# Patient Record
Sex: Female | Born: 1935 | Race: White | Hispanic: No | Smoking: Never smoker
Health system: Southern US, Community
[De-identification: ages and names within clinical notes are randomized; demographics above are authoritative.]

## PROBLEM LIST (undated history)

## (undated) DIAGNOSIS — T4145XA Adverse effect of unspecified anesthetic, initial encounter: Secondary | ICD-10-CM

## (undated) DIAGNOSIS — E039 Hypothyroidism, unspecified: Secondary | ICD-10-CM

## (undated) DIAGNOSIS — C801 Malignant (primary) neoplasm, unspecified: Secondary | ICD-10-CM

## (undated) DIAGNOSIS — Z5189 Encounter for other specified aftercare: Secondary | ICD-10-CM

## (undated) DIAGNOSIS — R112 Nausea with vomiting, unspecified: Secondary | ICD-10-CM

## (undated) DIAGNOSIS — Z803 Family history of malignant neoplasm of breast: Secondary | ICD-10-CM

## (undated) DIAGNOSIS — M810 Age-related osteoporosis without current pathological fracture: Secondary | ICD-10-CM

## (undated) DIAGNOSIS — D649 Anemia, unspecified: Secondary | ICD-10-CM

## (undated) DIAGNOSIS — T8859XA Other complications of anesthesia, initial encounter: Secondary | ICD-10-CM

## (undated) DIAGNOSIS — Z8489 Family history of other specified conditions: Secondary | ICD-10-CM

## (undated) DIAGNOSIS — Z9889 Other specified postprocedural states: Secondary | ICD-10-CM

## (undated) DIAGNOSIS — T7840XA Allergy, unspecified, initial encounter: Secondary | ICD-10-CM

## (undated) DIAGNOSIS — Z808 Family history of malignant neoplasm of other organs or systems: Secondary | ICD-10-CM

## (undated) DIAGNOSIS — Z8042 Family history of malignant neoplasm of prostate: Secondary | ICD-10-CM

## (undated) DIAGNOSIS — I10 Essential (primary) hypertension: Secondary | ICD-10-CM

## (undated) HISTORY — DX: Age-related osteoporosis without current pathological fracture: M81.0

## (undated) HISTORY — DX: Allergy, unspecified, initial encounter: T78.40XA

## (undated) HISTORY — DX: Family history of malignant neoplasm of prostate: Z80.42

## (undated) HISTORY — DX: Family history of malignant neoplasm of breast: Z80.3

## (undated) HISTORY — PX: TOTAL HIP ARTHROPLASTY: SHX124

## (undated) HISTORY — DX: Family history of malignant neoplasm of other organs or systems: Z80.8

## (undated) HISTORY — PX: THYROIDECTOMY: SHX17

## (undated) HISTORY — PX: OTHER SURGICAL HISTORY: SHX169

## (undated) HISTORY — PX: TONSILLECTOMY: SUR1361

## (undated) HISTORY — DX: Encounter for other specified aftercare: Z51.89

## (undated) HISTORY — DX: Anemia, unspecified: D64.9

---
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
## 1998-11-19 ENCOUNTER — Other Ambulatory Visit: Admission: RE | Admit: 1998-11-19 | Discharge: 1998-11-19 | Payer: Self-pay | Admitting: Family Medicine

## 2000-06-05 ENCOUNTER — Other Ambulatory Visit: Admission: RE | Admit: 2000-06-05 | Discharge: 2000-06-05 | Payer: Self-pay | Admitting: Family Medicine

## 2001-06-07 ENCOUNTER — Other Ambulatory Visit: Admission: RE | Admit: 2001-06-07 | Discharge: 2001-06-07 | Payer: Self-pay | Admitting: Family Medicine

## 2002-06-09 ENCOUNTER — Other Ambulatory Visit: Admission: RE | Admit: 2002-06-09 | Discharge: 2002-06-09 | Payer: Self-pay | Admitting: Family Medicine

## 2004-06-14 ENCOUNTER — Other Ambulatory Visit: Admission: RE | Admit: 2004-06-14 | Discharge: 2004-06-14 | Payer: Self-pay | Admitting: Family Medicine

## 2005-08-02 ENCOUNTER — Other Ambulatory Visit: Admission: RE | Admit: 2005-08-02 | Discharge: 2005-08-02 | Payer: Self-pay | Admitting: Family Medicine

## 2005-08-17 ENCOUNTER — Encounter: Admission: RE | Admit: 2005-08-17 | Discharge: 2005-08-17 | Payer: Self-pay | Admitting: Family Medicine

## 2006-03-19 DIAGNOSIS — M810 Age-related osteoporosis without current pathological fracture: Secondary | ICD-10-CM

## 2006-03-19 HISTORY — DX: Age-related osteoporosis without current pathological fracture: M81.0

## 2010-11-18 ENCOUNTER — Emergency Department (HOSPITAL_COMMUNITY): Payer: Medicare Other

## 2010-11-18 ENCOUNTER — Emergency Department (HOSPITAL_COMMUNITY)
Admission: EM | Admit: 2010-11-18 | Discharge: 2010-11-19 | Disposition: A | Discharge status: Home / Self Care | Payer: Medicare Other | Attending: Emergency Medicine | Admitting: Emergency Medicine

## 2010-11-18 DIAGNOSIS — E039 Hypothyroidism, unspecified: Secondary | ICD-10-CM | POA: Insufficient documentation

## 2010-11-18 DIAGNOSIS — R42 Dizziness and giddiness: Secondary | ICD-10-CM | POA: Insufficient documentation

## 2010-11-18 DIAGNOSIS — R5381 Other malaise: Secondary | ICD-10-CM | POA: Insufficient documentation

## 2010-11-18 DIAGNOSIS — K449 Diaphragmatic hernia without obstruction or gangrene: Secondary | ICD-10-CM | POA: Insufficient documentation

## 2010-11-18 DIAGNOSIS — M7989 Other specified soft tissue disorders: Secondary | ICD-10-CM | POA: Insufficient documentation

## 2010-11-18 DIAGNOSIS — R1110 Vomiting, unspecified: Secondary | ICD-10-CM | POA: Insufficient documentation

## 2010-11-18 DIAGNOSIS — I440 Atrioventricular block, first degree: Secondary | ICD-10-CM | POA: Insufficient documentation

## 2010-11-18 DIAGNOSIS — R0602 Shortness of breath: Secondary | ICD-10-CM | POA: Insufficient documentation

## 2010-11-18 DIAGNOSIS — I10 Essential (primary) hypertension: Secondary | ICD-10-CM | POA: Insufficient documentation

## 2010-11-18 DIAGNOSIS — Q619 Cystic kidney disease, unspecified: Secondary | ICD-10-CM | POA: Insufficient documentation

## 2010-11-18 DIAGNOSIS — R55 Syncope and collapse: Secondary | ICD-10-CM | POA: Insufficient documentation

## 2010-11-18 LAB — DIFFERENTIAL
Basophils Absolute: 0 10*3/uL (ref 0.0–0.1)
Basophils Relative: 0 % (ref 0–1)
Eosinophils Absolute: 0 10*3/uL (ref 0.0–0.7)
Lymphocytes Relative: 12 % (ref 12–46)
Monocytes Relative: 4 % (ref 3–12)
Neutro Abs: 8.5 10*3/uL — ABNORMAL HIGH (ref 1.7–7.7)

## 2010-11-18 LAB — CBC
Hemoglobin: 13.2 g/dL (ref 12.0–15.0)
MCV: 87.3 fL (ref 78.0–100.0)
WBC: 10.1 10*3/uL (ref 4.0–10.5)

## 2010-11-19 ENCOUNTER — Emergency Department (HOSPITAL_COMMUNITY): Payer: Medicare Other

## 2010-11-19 LAB — URINE MICROSCOPIC-ADD ON

## 2010-11-19 LAB — URINALYSIS, ROUTINE W REFLEX MICROSCOPIC
Hgb urine dipstick: NEGATIVE
Ketones, ur: NEGATIVE mg/dL
Protein, ur: NEGATIVE mg/dL
pH: 7.5 (ref 5.0–8.0)

## 2010-11-19 LAB — TROPONIN I: Troponin I: <0.3 ng/mL (ref <0.30)

## 2010-11-19 LAB — COMPREHENSIVE METABOLIC PANEL
Alkaline Phosphatase: 88 U/L (ref 39–117)
BUN: 10 mg/dL (ref 6–23)
Calcium: 9.4 mg/dL (ref 8.4–10.5)
Chloride: 101 mEq/L (ref 96–112)
GFR calc Af Amer: >60 mL/min (ref >60)
Potassium: 4 mEq/L (ref 3.5–5.1)

## 2010-11-19 LAB — PRO B NATRIURETIC PEPTIDE: Pro B Natriuretic peptide (BNP): 106.1 pg/mL (ref 0–450)

## 2010-11-19 LAB — CK TOTAL AND CKMB (NOT AT ARMC): Total CK: 114 U/L (ref 7–177)

## 2010-11-19 MED ORDER — IOHEXOL 300 MG/ML  SOLN
90.0000 mL | Freq: Once | INTRAMUSCULAR | Status: AC | PRN
  Administered 2010-11-19: 90 mL via INTRAVENOUS

## 2011-05-30 ENCOUNTER — Other Ambulatory Visit: Payer: Self-pay

## 2011-05-30 ENCOUNTER — Emergency Department (HOSPITAL_COMMUNITY)
Admission: EM | Admit: 2011-05-30 | Discharge: 2011-05-30 | Disposition: A | Discharge status: Home / Self Care | Payer: Medicare Other | Attending: Emergency Medicine | Admitting: Emergency Medicine

## 2011-05-30 ENCOUNTER — Emergency Department (HOSPITAL_COMMUNITY): Payer: Medicare Other

## 2011-05-30 DIAGNOSIS — F411 Generalized anxiety disorder: Secondary | ICD-10-CM | POA: Insufficient documentation

## 2011-05-30 DIAGNOSIS — R55 Syncope and collapse: Secondary | ICD-10-CM | POA: Insufficient documentation

## 2011-05-30 DIAGNOSIS — R0602 Shortness of breath: Secondary | ICD-10-CM | POA: Insufficient documentation

## 2011-05-30 DIAGNOSIS — Z79899 Other long term (current) drug therapy: Secondary | ICD-10-CM | POA: Insufficient documentation

## 2011-05-30 DIAGNOSIS — R5381 Other malaise: Secondary | ICD-10-CM | POA: Insufficient documentation

## 2011-05-30 DIAGNOSIS — I10 Essential (primary) hypertension: Secondary | ICD-10-CM | POA: Insufficient documentation

## 2011-05-30 DIAGNOSIS — E039 Hypothyroidism, unspecified: Secondary | ICD-10-CM | POA: Insufficient documentation

## 2011-05-30 DIAGNOSIS — I498 Other specified cardiac arrhythmias: Secondary | ICD-10-CM | POA: Insufficient documentation

## 2011-05-30 DIAGNOSIS — R002 Palpitations: Secondary | ICD-10-CM | POA: Insufficient documentation

## 2011-05-30 DIAGNOSIS — J45909 Unspecified asthma, uncomplicated: Secondary | ICD-10-CM | POA: Insufficient documentation

## 2011-05-30 DIAGNOSIS — R630 Anorexia: Secondary | ICD-10-CM | POA: Insufficient documentation

## 2011-05-30 DIAGNOSIS — R42 Dizziness and giddiness: Secondary | ICD-10-CM | POA: Insufficient documentation

## 2011-05-30 DIAGNOSIS — R5383 Other fatigue: Secondary | ICD-10-CM | POA: Insufficient documentation

## 2011-05-30 HISTORY — DX: Essential (primary) hypertension: I10

## 2011-05-30 HISTORY — DX: Hypothyroidism, unspecified: E03.9

## 2011-05-30 LAB — CARDIAC PANEL(CRET KIN+CKTOT+MB+TROPI)
CK, MB: 4.4 ng/mL — ABNORMAL HIGH (ref 0.3–4.0)
CK, MB: 4.3 ng/mL — ABNORMAL HIGH (ref 0.3–4.0)
Total CK: 138 U/L (ref 7–177)
Total CK: 117 U/L (ref 7–177)

## 2011-05-30 LAB — URINALYSIS, ROUTINE W REFLEX MICROSCOPIC
Ketones, ur: 40 mg/dL — AB
Leukocytes, UA: NEGATIVE
Nitrite: NEGATIVE
Protein, ur: NEGATIVE mg/dL

## 2011-05-30 LAB — BASIC METABOLIC PANEL
CO2: 21 mEq/L (ref 19–32)
Creatinine, Ser: 0.59 mg/dL (ref 0.50–1.10)
GFR calc non Af Amer: 86 mL/min — ABNORMAL LOW (ref >90)
Glucose, Bld: 124 mg/dL — ABNORMAL HIGH (ref 70–99)
Potassium: 3.6 mEq/L (ref 3.5–5.1)
Sodium: 132 mEq/L — ABNORMAL LOW (ref 135–145)

## 2011-05-30 LAB — DIFFERENTIAL
Lymphocytes Relative: 22 % (ref 12–46)
Neutro Abs: 5.6 10*3/uL (ref 1.7–7.7)

## 2011-05-30 LAB — CBC
HCT: 41.1 % (ref 36.0–46.0)
Hemoglobin: 14.5 g/dL (ref 12.0–15.0)
MCHC: 35.3 g/dL (ref 30.0–36.0)
Platelets: 358 10*3/uL (ref 150–400)
RBC: 4.85 MIL/uL (ref 3.87–5.11)
RDW: 13.5 % (ref 11.5–15.5)

## 2011-05-30 NOTE — ED Provider Notes (Signed)
History     CSN: [id number] Arrival date & time: 05/30/2011  8:11 AM   First MD Initiated Contact with Patient 05/30/11 [phone]      Chief Complaint  Patient presents with  . Palpitations    (Consider location/radiation/quality/duration/timing/severity/associated sxs/prior treatment) HPI Comments: Patient reports of feeling unwell since November 30. She believes that she had the flu. She's felt generally weak, lightheaded, decreased appetite, intermittent sensation of shortness of breath and palpitations. Chin episode this morning of palpitations that woke her from sleep and was associated with restlessness and anxiety. She denies any chest pain, chest tightness, abdominal pain, nausea or vomiting. She reports has not felt well since she was started on blood pressure medications in March.  She reports no cardiac problems but has never had a stress test. She states she does have a history of asthma but is not any medications. She's had multiple sick contacts as she works in a daycare but has not had any fevers. She's had no pain with urination or hematuria. She's been moving her bowels normally.   Patient is a 77 y.o. female presenting with palpitations. The history is provided by the patient and a relative.  Palpitations  This is a recurrent problem. The current episode started more than 1 week ago. The problem occurs constantly. The problem has been gradually worsening. The problem is associated with stress. Associated symptoms include malaise/fatigue, irregular heartbeat, near-syncope, dizziness and weakness. Pertinent negatives include no fever, no chest pain, no chest pressure, no syncope, no abdominal pain, no nausea, no vomiting, no headaches, no back pain, no cough and no shortness of breath.    Past Medical History  Diagnosis Date  . Hypothyroidism   . Hypertension     Past Surgical History  Procedure Date  . Thyroidectomy   . Total hip arthroplasty     History reviewed. No  pertinent family history.  History  Substance Use Topics  . Smoking status: Never Smoker   . Smokeless tobacco: Not on file  . Alcohol Use: No    OB History    Grav Para Term Preterm Abortions TAB SAB Ect Mult Living                  Review of Systems  Constitutional: Positive for malaise/fatigue, activity change, appetite change and fatigue. Negative for fever.  HENT: Negative for congestion and rhinorrhea.   Eyes: Negative for visual disturbance.  Respiratory: Negative for cough, chest tightness and shortness of breath.   Cardiovascular: Positive for palpitations and near-syncope. Negative for chest pain and syncope.  Gastrointestinal: Negative for nausea, vomiting and abdominal pain.  Genitourinary: Negative for dysuria and hematuria.  Musculoskeletal: Negative for back pain.  Skin: Negative for rash.  Neurological: Positive for dizziness, weakness and light-headedness. Negative for headaches.    Allergies  Review of patient's allergies indicates no known allergies.  Home Medications   Current Outpatient Rx  Name Route Sig Dispense Refill  . VITAMIN B COMPLEX PO Oral Take 1 tablet by mouth daily.      . IBUPROFEN 200 MG PO TABS Oral Take 200 mg by mouth daily as needed. For pain     . LEVOTHYROXINE SODIUM 100 MCG PO TABS Oral Take 100 mcg by mouth daily.     Carma Leaven M PLUS PO TABS Oral Take 1 tablet by mouth daily.      Marland Kitchen OLMESARTAN MEDOXOMIL 40 MG PO TABS Oral Take 40 mg by mouth daily.     Marland Kitchen VITAMIN  K PO Oral Take 1 tablet by mouth daily.        BP 156/81  Pulse 75  Temp(Src) 97.3 F (36.3 C) (Oral)  Resp 13  SpO2 98%  Physical Exam  Constitutional: She is oriented to person, place, and time. She appears well-developed and well-nourished. No distress.  HENT:  Head: Normocephalic and atraumatic.  Mouth/Throat: Oropharynx is clear and moist. No oropharyngeal exudate.  Eyes: Conjunctivae are normal. Pupils are equal, round, and reactive to light.  Neck:  Normal range of motion. Neck supple.  Cardiovascular: Normal rate, regular rhythm and normal heart sounds.   Pulmonary/Chest: Effort normal and breath sounds normal. No respiratory distress.  Abdominal: Soft. There is no tenderness. There is no rebound and no guarding.  Musculoskeletal: Normal range of motion.  Neurological: She is alert and oriented to person, place, and time. No cranial nerve deficit.  Skin: Skin is warm.    ED Course  Procedures (including critical care time)  Labs Reviewed  BASIC METABOLIC PANEL - Abnormal; Notable for the following:    Sodium 132 (*)    Glucose, Bld 124 (*)    GFR calc non Af Amer 86 (*)    All other components within normal limits  URINALYSIS, ROUTINE W REFLEX MICROSCOPIC - Abnormal; Notable for the following:    Ketones, ur 40 (*)    All other components within normal limits  CARDIAC PANEL(CRET KIN+CKTOT+MB+TROPI) - Abnormal; Notable for the following:    CK, MB 4.4 (*)    Relative Index 3.1 (*)    All other components within normal limits  CARDIAC PANEL(CRET KIN+CKTOT+MB+TROPI) - Abnormal; Notable for the following:    CK, MB 4.3 (*)    Relative Index 3.1 (*)    All other components within normal limits  CARDIAC PANEL(CRET KIN+CKTOT+MB+TROPI) - Abnormal; Notable for the following:    Relative Index 3.2 (*)    All other components within normal limits  CBC  DIFFERENTIAL  TSH  T4, FREE   Dg Chest 2 View  05/30/2011  *RADIOLOGY REPORT*  Clinical Data: Weakness and tachycardia.  CHEST - 2 VIEW  Comparison: Chest x-ray 11/18/2010.  Findings: The cardiac silhouette, mediastinal and hilar contours are within normal limits and stable.  The lungs demonstrate chronic changes with emphysematous change and areas of pulmonary scarring. No acute overlying pulmonary process.  No pleural effusion.  The bony thorax is intact.  IMPRESSION: Chronic lung changes but no acute pulmonary findings.  Original Report Authenticated By: P. Loralie Champagne, M.D.      1. Palpitations       MDM  Intermittent palpitations, SOB, weakness, fatigue x 2 weeks.  Symptoms woke her from sleep this morning.  No chest pain, abdominal pain.  Poor appetite.  Will check EKG, screening labs, orthostatics.   Date: 05/30/2011  Rate: 91  Rhythm: normal sinus rhythm  QRS Axis: normal  Intervals: normal  ST/T Wave abnormalities: nonspecific T wave changes  Conduction Disutrbances:none  Narrative Interpretation: PACs  Old EKG Reviewed: unchanged  Labs reviewedl.  Patient asymptomatic, tolerating PO.  Electrolytes and cardiac enzymes wnl.  Vitals stable.  Outpatient referral to cardiology for possible Holter.  D/w Dr. Myrtis Ser personally.      Glynn Octave, MD 05/30/11 [phone]

## 2011-05-30 NOTE — ED Notes (Signed)
Patient presents with palpitations upon waking this AM with anxiety, SOB, and restlessness.  Patient alert and orientedx3, denies chest pain.  Heart rate noted to be irregular on monitor.  Patient appears very anxious.

## 2011-05-31 ENCOUNTER — Telehealth: Payer: Self-pay | Admitting: Cardiology

## 2011-05-31 NOTE — Telephone Encounter (Signed)
Pt was seen in ED per her son Shannon Ellis. [name] would like his mother to see Dr. [name] because Per Mr. Kyung Rudd Dr. [name] is a client of his.

## 2011-05-31 NOTE — Telephone Encounter (Signed)
N/A.  LMTC. 

## 2011-06-01 NOTE — Telephone Encounter (Signed)
Pt was in ER for palpitations and note states she was discussed with you.  Will you take her on as a new pt?  You see her son Lloyd Huger.  If so, how soon does she need to be seen?

## 2011-08-24 DIAGNOSIS — H11049 Peripheral pterygium, stationary, unspecified eye: Secondary | ICD-10-CM | POA: Diagnosis not present

## 2011-08-24 DIAGNOSIS — H2510 Age-related nuclear cataract, unspecified eye: Secondary | ICD-10-CM | POA: Diagnosis not present

## 2011-08-25 DIAGNOSIS — M810 Age-related osteoporosis without current pathological fracture: Secondary | ICD-10-CM | POA: Diagnosis not present

## 2011-08-25 DIAGNOSIS — R7301 Impaired fasting glucose: Secondary | ICD-10-CM | POA: Diagnosis not present

## 2011-08-25 DIAGNOSIS — I10 Essential (primary) hypertension: Secondary | ICD-10-CM | POA: Diagnosis not present

## 2011-08-25 DIAGNOSIS — E785 Hyperlipidemia, unspecified: Secondary | ICD-10-CM | POA: Diagnosis not present

## 2011-08-31 DIAGNOSIS — Z1212 Encounter for screening for malignant neoplasm of rectum: Secondary | ICD-10-CM | POA: Diagnosis not present

## 2011-09-01 DIAGNOSIS — E785 Hyperlipidemia, unspecified: Secondary | ICD-10-CM | POA: Diagnosis not present

## 2011-09-01 DIAGNOSIS — I10 Essential (primary) hypertension: Secondary | ICD-10-CM | POA: Diagnosis not present

## 2011-09-01 DIAGNOSIS — Z124 Encounter for screening for malignant neoplasm of cervix: Secondary | ICD-10-CM | POA: Diagnosis not present

## 2011-09-01 DIAGNOSIS — Z0000 Encounter for general adult medical examination without abnormal findings: Secondary | ICD-10-CM | POA: Diagnosis not present

## 2011-09-01 DIAGNOSIS — R7301 Impaired fasting glucose: Secondary | ICD-10-CM | POA: Diagnosis not present

## 2012-01-18 DIAGNOSIS — N2889 Other specified disorders of kidney and ureter: Secondary | ICD-10-CM | POA: Diagnosis not present

## 2012-01-18 DIAGNOSIS — R1031 Right lower quadrant pain: Secondary | ICD-10-CM | POA: Diagnosis not present

## 2012-01-18 DIAGNOSIS — I10 Essential (primary) hypertension: Secondary | ICD-10-CM | POA: Diagnosis not present

## 2012-01-18 DIAGNOSIS — K5730 Diverticulosis of large intestine without perforation or abscess without bleeding: Secondary | ICD-10-CM | POA: Diagnosis not present

## 2012-01-18 DIAGNOSIS — S72009A Fracture of unspecified part of neck of unspecified femur, initial encounter for closed fracture: Secondary | ICD-10-CM | POA: Diagnosis not present

## 2012-01-18 DIAGNOSIS — M549 Dorsalgia, unspecified: Secondary | ICD-10-CM | POA: Diagnosis not present

## 2012-11-13 ENCOUNTER — Emergency Department (HOSPITAL_COMMUNITY)
Admission: EM | Admit: 2012-11-13 | Discharge: 2012-11-13 | Disposition: A | Discharge status: Home / Self Care | Payer: Medicare Other | Attending: Emergency Medicine | Admitting: Emergency Medicine

## 2012-11-13 ENCOUNTER — Encounter (HOSPITAL_COMMUNITY): Payer: Self-pay | Admitting: Emergency Medicine

## 2012-11-13 ENCOUNTER — Emergency Department (HOSPITAL_COMMUNITY): Payer: Medicare Other

## 2012-11-13 DIAGNOSIS — I10 Essential (primary) hypertension: Secondary | ICD-10-CM | POA: Insufficient documentation

## 2012-11-13 DIAGNOSIS — R0789 Other chest pain: Secondary | ICD-10-CM | POA: Diagnosis not present

## 2012-11-13 DIAGNOSIS — R209 Unspecified disturbances of skin sensation: Secondary | ICD-10-CM | POA: Diagnosis not present

## 2012-11-13 DIAGNOSIS — Z79899 Other long term (current) drug therapy: Secondary | ICD-10-CM | POA: Diagnosis not present

## 2012-11-13 DIAGNOSIS — R42 Dizziness and giddiness: Secondary | ICD-10-CM | POA: Diagnosis not present

## 2012-11-13 DIAGNOSIS — R112 Nausea with vomiting, unspecified: Secondary | ICD-10-CM | POA: Insufficient documentation

## 2012-11-13 DIAGNOSIS — R079 Chest pain, unspecified: Secondary | ICD-10-CM | POA: Diagnosis not present

## 2012-11-13 DIAGNOSIS — E039 Hypothyroidism, unspecified: Secondary | ICD-10-CM | POA: Diagnosis not present

## 2012-11-13 DIAGNOSIS — R51 Headache: Secondary | ICD-10-CM | POA: Insufficient documentation

## 2012-11-13 LAB — BASIC METABOLIC PANEL
CO2: 20 mEq/L (ref 19–32)
Calcium: 9.4 mg/dL (ref 8.4–10.5)
Creatinine, Ser: 0.68 mg/dL (ref 0.50–1.10)
Glucose, Bld: 175 mg/dL — ABNORMAL HIGH (ref 70–99)

## 2012-11-13 LAB — CBC WITH DIFFERENTIAL/PLATELET
Eosinophils Relative: 5 % (ref 0–5)
HCT: 40.3 % (ref 36.0–46.0)
Lymphocytes Relative: 30 % (ref 12–46)
Lymphs Abs: 1.9 10*3/uL (ref 0.7–4.0)
MCV: 89 fL (ref 78.0–100.0)
Monocytes Absolute: 0.5 10*3/uL (ref 0.1–1.0)
RBC: 4.53 MIL/uL (ref 3.87–5.11)
WBC: 6.3 10*3/uL (ref 4.0–10.5)

## 2012-11-13 LAB — POCT I-STAT TROPONIN I

## 2012-11-13 MED ORDER — ONDANSETRON 4 MG PO TBDP: 4.0000 mg | ORAL_TABLET | Freq: Three times a day (TID) | ORAL | Status: DC | PRN

## 2012-11-13 MED ORDER — ONDANSETRON HCL 4 MG/2ML IJ SOLN
4.0000 mg | Freq: Once | INTRAMUSCULAR | Status: AC
  Administered 2012-11-13: 4 mg via INTRAVENOUS

## 2012-11-13 MED ORDER — ONDANSETRON 4 MG PO TBDP: 8.0000 mg | ORAL_TABLET | Freq: Once | ORAL | Status: DC

## 2012-11-13 MED ORDER — ONDANSETRON HCL 4 MG/2ML IJ SOLN
INTRAMUSCULAR | Status: AC
  Administered 2012-11-13: 4 mg via INTRAVENOUS
  Filled 2012-11-13: qty 2

## 2012-11-13 NOTE — ED Provider Notes (Signed)
Medical screening examination/treatment/procedure(s) were conducted as a shared visit with non-physician practitioner(s) and myself.  I personally evaluated the patient during the encounter.  Patient with non-specific symptoms of headache, "strange" sensation in head, weakness, dizziness which has spontaneously resolved. Normal exam currently. Workup also negative. Discussed with PCP, recommended folow up in office.   EKG:  Date: 11/13/2012  Rate: 84  Rhythm: normal sinus rhythm  QRS Axis: normal  Intervals: normal  ST/T Wave abnormalities: normal  Conduction Disutrbances:none  Narrative Interpretation:   Old EKG Reviewed: unchanged     Gilda Crease, MD 11/13/12 1240

## 2012-11-13 NOTE — Discharge Instructions (Signed)
Take Zofran as needed for nausea. Follow up with Dr. Clelia Croft. Refer to attached documents for more information regarding your diagnosis. Return to the ED with worsening or concerning symptoms.

## 2012-11-13 NOTE — ED Notes (Signed)
H/a first and then startd to vomit this am

## 2012-11-13 NOTE — ED Provider Notes (Signed)
History     CSN: [id number]  Arrival date & time 11/13/12  7829   First MD Initiated Contact with Patient 11/13/12 [phone]      Chief Complaint  Patient presents with  . Emesis  . Headache    (Consider location/radiation/quality/duration/timing/severity/associated sxs/prior treatment) HPI Comments: Patient is a 79 year old female with a past medical history of hypertension and hypothyroidism who presents with a "weird facial sensation" that occurred prior to arrival. Patient reports driving to work and experiencing sudden onset of a facial sensation that is difficulty for the patient to describe. The sensation involved the left side of her face and lasted about 5 minutes. Patient reports sudden onset of nausea and vomiting after she noticed the facial sensation. Patient also describes a feeling of chest heaviness without radiation that lasted about 5 minutes before resolving. Patient is currently asymptomatic except for nausea. No aggravating/alleviating factors. No other associated symptoms.    Past Medical History  Diagnosis Date  . Hypothyroidism   . Hypertension     Past Surgical History  Procedure Laterality Date  . Thyroidectomy    . Total hip arthroplasty      No family history on file.  History  Substance Use Topics  . Smoking status: Never Smoker   . Smokeless tobacco: Not on file  . Alcohol Use: No    OB History   Grav Para Term Preterm Abortions TAB SAB Ect Mult Living                  Review of Systems  Cardiovascular: Positive for chest pain.  Gastrointestinal: Positive for nausea and vomiting.  Neurological: Positive for dizziness and numbness.  All other systems reviewed and are negative.    Allergies  Review of patient's allergies indicates no known allergies.  Home Medications   Current Outpatient Rx  Name  Route  Sig  Dispense  Refill  . B Complex Vitamins (VITAMIN B COMPLEX PO)   Oral   Take 1 tablet by mouth daily.           Marland Kitchen  ibuprofen (ADVIL,MOTRIN) 200 MG tablet   Oral   Take 200 mg by mouth daily as needed. For pain          . levothyroxine (SYNTHROID, LEVOTHROID) 100 MCG tablet   Oral   Take 100 mcg by mouth daily.          . Multiple Vitamins-Minerals (MULTIVITAMINS THER. W/MINERALS) TABS   Oral   Take 1 tablet by mouth daily.           Marland Kitchen olmesartan (BENICAR) 40 MG tablet   Oral   Take 40 mg by mouth daily.          Marland Kitchen VITAMIN K PO   Oral   Take 1 tablet by mouth daily.             BP 169/81  Pulse 99  Temp(Src) 97.6 F (36.4 C) (Oral)  Resp 17  Ht 5\' 6"  (1.676 m)  Wt 170 lb (77.111 kg)  BMI 27.45 kg/m2  SpO2 99%  Physical Exam  Nursing note and vitals reviewed. Constitutional: She is oriented to person, place, and time. She appears well-developed and well-nourished. No distress.  HENT:  Head: Normocephalic and atraumatic.  Right Ear: External ear normal.  Left Ear: External ear normal.  Mouth/Throat: Oropharynx is clear and moist. No oropharyngeal exudate.  Eyes: Conjunctivae and EOM are normal. Pupils are equal, round, and reactive to light. No  scleral icterus.  Neck: Normal range of motion.  Cardiovascular: Normal rate and regular rhythm.  Exam reveals no gallop and no friction rub.   No murmur heard. Pulmonary/Chest: Effort normal and breath sounds normal. She has no wheezes. She has no rales. She exhibits no tenderness.  Abdominal: Soft. She exhibits no distension. There is no tenderness. There is no rebound and no guarding.  Musculoskeletal: Normal range of motion.  Neurological: She is alert and oriented to person, place, and time. No cranial nerve deficit. Coordination normal.  Extremity strength and sensation equal and intact bilaterally. Speech is goal-oriented. Moves limbs without ataxia.   Skin: Skin is warm and dry.  Psychiatric: She has a normal mood and affect. Her behavior is normal.    ED Course  Procedures (including critical care time)  Labs Reviewed   BASIC METABOLIC PANEL - Abnormal; Notable for the following:    Potassium 3.3 (*)    Glucose, Bld 175 (*)    GFR calc non Af Amer 82 (*)    All other components within normal limits  CBC WITH DIFFERENTIAL  POCT I-STAT TROPONIN I   Dg Chest 2 View  11/13/2012   *RADIOLOGY REPORT*  Clinical Data: Chest pressure.  Dizziness.  Nausea and vomiting.  CHEST - 2 VIEW  Comparison: 05/30/2011  Findings: Midline trachea.  Mild convex right thoracic spine curvature.  Borderline cardiomegaly, with atherosclerosis in the transverse aorta. No pleural effusion or pneumothorax.  Mild biapical pleural thickening. Clear lungs.  IMPRESSION: Borderline cardiomegaly, without acute disease.   Original Report Authenticated By: Jeronimo Greaves, M.D.   Ct Head Wo Contrast  11/13/2012   *RADIOLOGY REPORT*  Clinical Data: Headache and emesis.  CT HEAD WITHOUT CONTRAST  Technique:  Contiguous axial images were obtained from the base of the skull through the vertex without contrast.  Comparison: None.  Findings: Bone windows demonstrate mucosal thickening of the left maxillary sinus and left ethmoid air cells.  Left sphenoid sinus mucosal thickening. Clear mastoid air cells.  Soft tissue windows demonstrate normal cerebral volume for age. Moderate low density in the periventricular white matter likely related to small vessel disease. No  mass lesion, hemorrhage, hydrocephalus, acute infarct, intra-axial, or extra-axial fluid collection.  IMPRESSION:  1. No acute intracranial abnormality. 2.  Moderate small vessel ischemic change. 3.  Sinus disease.   Original Report Authenticated By: Jeronimo Greaves, M.D.     1. Nausea and vomiting       MDM  9:05 AM Labs, troponin, CT head pending. Patient will have zofran for nausea. Vitals stable and patient afebrile.     11:14 AM Labs, troponin and CT head unremarkable for acute change. I spoke with Dr. Felipa Eth of [facility] about the patient's visit today. We both agree the  patient is low risk and can follow up in the office. She has had normal carotid doppler studies in the past year and only hypertension and hypothyroidism for PMH. Patient will be discharged with Zofran for nausea and instructed to return with worsening or concerning symptoms. Dr. Blinda Leatherwood saw the patient and agrees with outpatient follow up.         Emilia Beck, PA-C 11/13/12 1151

## 2012-11-13 NOTE — ED Notes (Signed)
Patient transported to CT 

## 2012-11-14 DIAGNOSIS — R51 Headache: Secondary | ICD-10-CM | POA: Diagnosis not present

## 2012-11-14 DIAGNOSIS — Z1331 Encounter for screening for depression: Secondary | ICD-10-CM | POA: Diagnosis not present

## 2012-11-14 DIAGNOSIS — Z6826 Body mass index (BMI) 26.0-26.9, adult: Secondary | ICD-10-CM | POA: Diagnosis not present

## 2012-11-14 DIAGNOSIS — R112 Nausea with vomiting, unspecified: Secondary | ICD-10-CM | POA: Diagnosis not present

## 2012-11-25 DIAGNOSIS — M810 Age-related osteoporosis without current pathological fracture: Secondary | ICD-10-CM | POA: Diagnosis not present

## 2012-11-25 DIAGNOSIS — R7301 Impaired fasting glucose: Secondary | ICD-10-CM | POA: Diagnosis not present

## 2012-11-25 DIAGNOSIS — E063 Autoimmune thyroiditis: Secondary | ICD-10-CM | POA: Diagnosis not present

## 2012-11-25 DIAGNOSIS — I10 Essential (primary) hypertension: Secondary | ICD-10-CM | POA: Diagnosis not present

## 2012-11-25 DIAGNOSIS — R82998 Other abnormal findings in urine: Secondary | ICD-10-CM | POA: Diagnosis not present

## 2012-11-25 DIAGNOSIS — E785 Hyperlipidemia, unspecified: Secondary | ICD-10-CM | POA: Diagnosis not present

## 2012-12-02 DIAGNOSIS — Z0000 Encounter for general adult medical examination without abnormal findings: Secondary | ICD-10-CM | POA: Diagnosis not present

## 2012-12-02 DIAGNOSIS — I10 Essential (primary) hypertension: Secondary | ICD-10-CM | POA: Diagnosis not present

## 2012-12-02 DIAGNOSIS — R51 Headache: Secondary | ICD-10-CM | POA: Diagnosis not present

## 2012-12-02 DIAGNOSIS — E785 Hyperlipidemia, unspecified: Secondary | ICD-10-CM | POA: Diagnosis not present

## 2012-12-02 DIAGNOSIS — E063 Autoimmune thyroiditis: Secondary | ICD-10-CM | POA: Diagnosis not present

## 2012-12-02 DIAGNOSIS — Z6826 Body mass index (BMI) 26.0-26.9, adult: Secondary | ICD-10-CM | POA: Diagnosis not present

## 2012-12-02 DIAGNOSIS — R7301 Impaired fasting glucose: Secondary | ICD-10-CM | POA: Diagnosis not present

## 2012-12-02 DIAGNOSIS — M810 Age-related osteoporosis without current pathological fracture: Secondary | ICD-10-CM | POA: Diagnosis not present

## 2012-12-03 DIAGNOSIS — Z1212 Encounter for screening for malignant neoplasm of rectum: Secondary | ICD-10-CM | POA: Diagnosis not present

## 2012-12-17 DIAGNOSIS — M25559 Pain in unspecified hip: Secondary | ICD-10-CM | POA: Diagnosis not present

## 2013-01-07 DIAGNOSIS — M25559 Pain in unspecified hip: Secondary | ICD-10-CM | POA: Diagnosis not present

## 2013-02-10 DIAGNOSIS — N1330 Unspecified hydronephrosis: Secondary | ICD-10-CM | POA: Diagnosis not present

## 2013-02-18 DIAGNOSIS — N1330 Unspecified hydronephrosis: Secondary | ICD-10-CM | POA: Diagnosis not present

## 2013-02-19 DIAGNOSIS — N1330 Unspecified hydronephrosis: Secondary | ICD-10-CM | POA: Diagnosis not present

## 2013-02-19 DIAGNOSIS — N281 Cyst of kidney, acquired: Secondary | ICD-10-CM | POA: Diagnosis not present

## 2013-06-07 ENCOUNTER — Emergency Department (HOSPITAL_COMMUNITY): Payer: Medicare Other

## 2013-06-07 ENCOUNTER — Emergency Department (HOSPITAL_COMMUNITY)
Admission: EM | Admit: 2013-06-07 | Discharge: 2013-06-07 | Disposition: A | Discharge status: Home / Self Care | Payer: Medicare Other | Attending: Emergency Medicine | Admitting: Emergency Medicine

## 2013-06-07 ENCOUNTER — Encounter (HOSPITAL_COMMUNITY): Payer: Self-pay | Admitting: Emergency Medicine

## 2013-06-07 DIAGNOSIS — J449 Chronic obstructive pulmonary disease, unspecified: Secondary | ICD-10-CM | POA: Diagnosis not present

## 2013-06-07 DIAGNOSIS — R42 Dizziness and giddiness: Secondary | ICD-10-CM | POA: Diagnosis not present

## 2013-06-07 DIAGNOSIS — I10 Essential (primary) hypertension: Secondary | ICD-10-CM | POA: Insufficient documentation

## 2013-06-07 DIAGNOSIS — R0602 Shortness of breath: Secondary | ICD-10-CM | POA: Diagnosis not present

## 2013-06-07 DIAGNOSIS — R110 Nausea: Secondary | ICD-10-CM | POA: Insufficient documentation

## 2013-06-07 DIAGNOSIS — E039 Hypothyroidism, unspecified: Secondary | ICD-10-CM | POA: Diagnosis not present

## 2013-06-07 DIAGNOSIS — R5381 Other malaise: Secondary | ICD-10-CM | POA: Diagnosis not present

## 2013-06-07 DIAGNOSIS — R072 Precordial pain: Secondary | ICD-10-CM | POA: Insufficient documentation

## 2013-06-07 DIAGNOSIS — Z79899 Other long term (current) drug therapy: Secondary | ICD-10-CM | POA: Diagnosis not present

## 2013-06-07 LAB — RAPID URINE DRUG SCREEN, HOSP PERFORMED
Amphetamines: NOT DETECTED
Barbiturates: NOT DETECTED
Benzodiazepines: NOT DETECTED

## 2013-06-07 LAB — URINALYSIS, ROUTINE W REFLEX MICROSCOPIC
Glucose, UA: NEGATIVE mg/dL
Hgb urine dipstick: NEGATIVE
Leukocytes, UA: NEGATIVE
Protein, ur: NEGATIVE mg/dL
Specific Gravity, Urine: 1.005 (ref 1.005–1.030)
pH: 6.5 (ref 5.0–8.0)

## 2013-06-07 LAB — COMPREHENSIVE METABOLIC PANEL
Albumin: 3.9 g/dL (ref 3.5–5.2)
BUN: 13 mg/dL (ref 6–23)
Calcium: 9.4 mg/dL (ref 8.4–10.5)
Creatinine, Ser: 0.71 mg/dL (ref 0.50–1.10)
Total Protein: 7.6 g/dL (ref 6.0–8.3)

## 2013-06-07 LAB — POCT I-STAT TROPONIN I
Troponin i, poc: 0 ng/mL (ref 0.00–0.08)
Troponin i, poc: 0.01 ng/mL (ref 0.00–0.08)

## 2013-06-07 LAB — CBC
HCT: 42.2 % (ref 36.0–46.0)
MCH: 30.6 pg (ref 26.0–34.0)
MCHC: 34.1 g/dL (ref 30.0–36.0)
MCV: 89.6 fL (ref 78.0–100.0)
RBC: 4.71 MIL/uL (ref 3.87–5.11)
RDW: 13.6 % (ref 11.5–15.5)

## 2013-06-07 MED ORDER — SODIUM CHLORIDE 0.9 % IV BOLUS (SEPSIS)
1000.0000 mL | Freq: Once | INTRAVENOUS | Status: AC
  Administered 2013-06-07: 1000 mL via INTRAVENOUS

## 2013-06-07 MED ORDER — MECLIZINE HCL 25 MG PO TABS: 25.0000 mg | ORAL_TABLET | Freq: Two times a day (BID) | ORAL | Status: DC | PRN

## 2013-06-07 MED ORDER — MECLIZINE HCL 25 MG PO TABS
25.0000 mg | ORAL_TABLET | Freq: Once | ORAL | Status: AC
  Administered 2013-06-07: 25 mg via ORAL
  Filled 2013-06-07: qty 1

## 2013-06-07 NOTE — ED Notes (Signed)
EDP at bedside to explain results and provide discharge instructions

## 2013-06-07 NOTE — ED Provider Notes (Addendum)
CSN: [id number]     Arrival date & time 06/07/13  1030 History   First MD Initiated Contact with Patient 06/07/13 1046     Chief Complaint  Patient presents with  . Dizziness   (Consider location/radiation/quality/duration/timing/severity/associated sxs/prior Treatment) Patient is a 79 y.o. female presenting with chest pain and dizziness. The history is provided by the patient.  Chest Pain Pain location:  Substernal area Pain quality: burning   Pain radiates to:  Does not radiate Pain radiates to the back: no   Pain severity:  Mild Onset quality:  Gradual Duration: has been there all morning since she woke up. Timing:  Constant Progression:  Unchanged Chronicity:  Recurrent Context comment:  States over the last few days she had some nausea and vomiting then yesterday she was ok and then woke up this today not feeling well Relieved by:  None tried Worsened by:  Nothing tried Ineffective treatments:  None tried Associated symptoms: dizziness, nausea, shortness of breath and weakness   Associated symptoms: no abdominal pain, no fever, no syncope and not vomiting   Associated symptoms comment:  Chronic SOB but now worse today. Risk factors: hypertension   Risk factors: no coronary artery disease, no diabetes mellitus and no smoking   Dizziness Quality:  Imbalance and vertigo Severity:  Mild Onset quality:  Gradual Duration: there when she woke up this morning. Timing:  Constant Progression:  Waxing and waning Chronicity:  Recurrent Context: eye movement and head movement   Context: not with loss of consciousness and not when urinating   Relieved by:  Being still Worsened by:  Eye movement, movement and turning head Ineffective treatments:  None tried Associated symptoms: chest pain, nausea, shortness of breath and weakness   Associated symptoms: no syncope and no vomiting   Associated symptoms comment:  Generalized weakness Risk factors: hx of vertigo   Risk factors: no  anemia and no hx of stroke     Past Medical History  Diagnosis Date  . Hypothyroidism   . Hypertension    Past Surgical History  Procedure Laterality Date  . Thyroidectomy    . Total hip arthroplasty     No family history on file. History  Substance Use Topics  . Smoking status: Never Smoker   . Smokeless tobacco: Not on file  . Alcohol Use: No   OB History   Grav Para Term Preterm Abortions TAB SAB Ect Mult Living                 Review of Systems  Constitutional: Negative for fever.  Respiratory: Positive for shortness of breath.   Cardiovascular: Positive for chest pain. Negative for syncope.  Gastrointestinal: Positive for nausea. Negative for vomiting and abdominal pain.  Neurological: Positive for dizziness and weakness.  All other systems reviewed and are negative.    Allergies  Review of patient's allergies indicates no known allergies.  Home Medications   Current Outpatient Rx  Name  Route  Sig  Dispense  Refill  . amLODipine (NORVASC) 2.5 MG tablet               . B Complex Vitamins (VITAMIN B COMPLEX PO)   Oral   Take 1 tablet by mouth daily.           Marland Kitchen ibuprofen (ADVIL,MOTRIN) 200 MG tablet   Oral   Take 200 mg by mouth daily as needed. For pain          . levothyroxine (SYNTHROID, LEVOTHROID) 100 MCG  tablet   Oral   Take 100 mcg by mouth daily.          Marland Kitchen loratadine (CLARITIN) 10 MG tablet   Oral   Take 10 mg by mouth daily.         . Multiple Vitamins-Minerals (MULTIVITAMINS THER. W/MINERALS) TABS   Oral   Take 1 tablet by mouth daily.           Marland Kitchen olmesartan (BENICAR) 40 MG tablet   Oral   Take 40 mg by mouth daily.          . ondansetron (ZOFRAN ODT) 4 MG disintegrating tablet   Oral   Take 1 tablet (4 mg total) by mouth every 8 (eight) hours as needed for nausea.   10 tablet   0   . Tetrahydrozoline-Zn Sulfate (EYE DROPS ALLERGY RELIEF OP)   Ophthalmic   Apply 1 drop to eye daily as needed (allergies).           BP 163/90  Pulse 93  Temp(Src) 97.7 F (36.5 C) (Oral)  Resp 24  SpO2 95% Physical Exam  Nursing note and vitals reviewed. Constitutional: She is oriented to person, place, and time. She appears well-developed and well-nourished. No distress.  HENT:  Head: Normocephalic and atraumatic.  Mouth/Throat: Oropharynx is clear and moist.  Eyes: Conjunctivae and EOM are normal. Pupils are equal, round, and reactive to light.  Neck: Normal range of motion. Neck supple.  Cardiovascular: Normal rate, regular rhythm and intact distal pulses.   No murmur heard. Pulmonary/Chest: Effort normal and breath sounds normal. No respiratory distress. She has no wheezes. She has no rales. She exhibits no tenderness.  Abdominal: Soft. She exhibits no distension. There is no tenderness. There is no rebound and no guarding.  Musculoskeletal: Normal range of motion. She exhibits no edema and no tenderness.  Neurological: She is alert and oriented to person, place, and time. She has normal strength. No cranial nerve deficit or sensory deficit. Coordination normal.  Symptoms reproduced with head movement. Normal finger to nose and normal heel to shin.  Pt does not feel well enough to walk right now  Skin: Skin is warm and dry. No rash noted. No erythema.  Psychiatric: She has a normal mood and affect. Her behavior is normal.    ED Course  Procedures (including critical care time) Labs Review Labs Reviewed  COMPREHENSIVE METABOLIC PANEL - Abnormal; Notable for the following:    Glucose, Bld 113 (*)    GFR calc non Af Amer 80 (*)    All other components within normal limits  URINE RAPID DRUG SCREEN (HOSP PERFORMED)  CBC  URINALYSIS, ROUTINE W REFLEX MICROSCOPIC  POCT I-STAT TROPONIN I  POCT I-STAT TROPONIN I   Imaging Review Dg Chest 2 View  06/07/2013   CLINICAL DATA:  History of reactive airway disease and hypertension and chronic dyspnea with new onset dizziness  EXAM: CHEST  2 VIEW  COMPARISON:   Chest x-ray dated Nov 13, 2012.  FINDINGS: The lungs are mildly hyperinflated with hemidiaphragm flattening especially conspicuous on the lateral film. There is no alveolar infiltrate. The interstitial markings are slightly more conspicuous diffusely on the current study. The cardiac silhouette is normal in size. The pulmonary vascularity is not engorged. There is mild tortuosity of the descending thoracic aorta. There is gentle dextroscoliosis of the thoracic spine with the convexity toward the right.  IMPRESSION: 1. There is hyperinflation consistent with COPD. There may be superimposed acute bronchitis but there  is no evidence of pneumonia. 2. There is no evidence of CHF nor mediastinal or hilar lymphadenopathy.   Electronically Signed   By: David  [place]   On: 06/07/2013 11:46   Mr Brain Wo Contrast  06/07/2013   CLINICAL DATA:  Hypertension and dizziness.  EXAM: MRI HEAD WITHOUT CONTRAST  MRA HEAD WITHOUT CONTRAST  TECHNIQUE: Multiplanar, multiecho pulse sequences of the brain and surrounding structures were obtained without intravenous contrast. Angiographic images of the head were obtained using MRA technique without contrast.  COMPARISON:  CT head without contrast 11/13/2012.  FINDINGS: MRI HEAD FINDINGS  The diffusion-weighted images demonstrate no evidence for acute or subacute infarction. Moderate generalized atrophy and diffuse white matter changes are evident bilaterally. There are remote lacunar infarcts in the basal ganglia bilaterally. The ventricles are proportionate to the degree of atrophy. No significant extra-axial fluid collection is present.  Flow is present in the major intracranial arteries. The globes and orbits are intact. Mild mucosal thickening is present throughout the ethmoid air cells: Bilateral maxillary sinuses, and left frontal sinus. There is a small fluid level on the left sphenoid sinus. The mastoid air cells are clear.  MRA HEAD FINDINGS  The internal carotid arteries are  within normal limits the high cervical segments through the ICA termini bilaterally. The A1 and M1 segments are normal. The anterior communicating artery is patent. There is mild attenuation of distal MCA branch vessels bilaterally. No significant proximal stenosis or aneurysm is present. There is some attenuation of distal ACA branches is well.  The left vertebral artery is the dominant vessel. The basilar artery is within normal limits. Both posterior cerebral arteries originate from the basilar tip. There is mild attenuation of distal PCA branch vessels.  IMPRESSION: 1. No evidence patch that no acute or focal abnormality to explain the patient's acute symptoms. 2. Atrophy and diffuse white matter disease. This likely reflects the sequelae of chronic microvascular ischemia. 3. Remote lacunar infarcts involving the basal ganglia bilaterally. 4. Mild distal small vessel disease is evident on the MRA. 5. No significant proximal stenosis, aneurysm, or branch vessel occlusion.   Electronically Signed   By: Gennette Pac M.D.   On: 06/07/2013 14:13   Mr Maxine Glenn Head/brain Wo Cm  06/07/2013   CLINICAL DATA:  Hypertension and dizziness.  EXAM: MRI HEAD WITHOUT CONTRAST  MRA HEAD WITHOUT CONTRAST  TECHNIQUE: Multiplanar, multiecho pulse sequences of the brain and surrounding structures were obtained without intravenous contrast. Angiographic images of the head were obtained using MRA technique without contrast.  COMPARISON:  CT head without contrast 11/13/2012.  FINDINGS: MRI HEAD FINDINGS  The diffusion-weighted images demonstrate no evidence for acute or subacute infarction. Moderate generalized atrophy and diffuse white matter changes are evident bilaterally. There are remote lacunar infarcts in the basal ganglia bilaterally. The ventricles are proportionate to the degree of atrophy. No significant extra-axial fluid collection is present.  Flow is present in the major intracranial arteries. The globes and orbits are  intact. Mild mucosal thickening is present throughout the ethmoid air cells: Bilateral maxillary sinuses, and left frontal sinus. There is a small fluid level on the left sphenoid sinus. The mastoid air cells are clear.  MRA HEAD FINDINGS  The internal carotid arteries are within normal limits the high cervical segments through the ICA termini bilaterally. The A1 and M1 segments are normal. The anterior communicating artery is patent. There is mild attenuation of distal MCA branch vessels bilaterally. No significant proximal stenosis or aneurysm is present. There  is some attenuation of distal ACA branches is well.  The left vertebral artery is the dominant vessel. The basilar artery is within normal limits. Both posterior cerebral arteries originate from the basilar tip. There is mild attenuation of distal PCA branch vessels.  IMPRESSION: 1. No evidence patch that no acute or focal abnormality to explain the patient's acute symptoms. 2. Atrophy and diffuse white matter disease. This likely reflects the sequelae of chronic microvascular ischemia. 3. Remote lacunar infarcts involving the basal ganglia bilaterally. 4. Mild distal small vessel disease is evident on the MRA. 5. No significant proximal stenosis, aneurysm, or branch vessel occlusion.   Electronically Signed   By: Gennette Pac M.D.   On: 06/07/2013 14:13    EKG Interpretation    Date/Time:  Saturday June 07 2013 10:45:18 EST Ventricular Rate:  94 PR Interval:  206 QRS Duration: 95 QT Interval:  401 QTC Calculation: 501 R Axis:   0 Text Interpretation:  Sinus rhythm Ventricular premature complex Prolonged PR interval Borderline prolonged QT interval Baseline wander in lead(s) V1 No significant change since last tracing Confirmed by Tilley Faeth  MD, Haydin Dunn ([id number]) on 06/07/2013 10:47:27 AM            MDM   1. Vertigo     Patient presents today with a vague history of waking up this morning and having mild vertiginous symptoms but  having difficulty walking and needing to steady herself. She states she's had some vertigo in the past but has never lasted this long and this feels a little different. Patient has no focal neurologic findings on exam, but symptoms are reproduced when patient moves her head quickly to either side or moves her eye. Given patient's age and medical history feel that she needs to be ruled out for a stroke. TIA labs ordered an MRI to further evaluate the cerebellar area of her brain.  Secondly patient has been describing chest discomfort since she woke up this morning. However she states for the last 2 days she had nausea and vomiting. She describes it as a sensation that feels like acid reflux. She states she is always short of breath but no more short of breath than usual she does have a history of asthma. Do prior cardiac history and she has never had a heart attack to her knowledge. EKG is unchanged from prior vital signs are stable. Patient took an aspirin this morning and her antihypertensives. CXR wnl.  Troponin pending.  12:01 PM Now pt is able to walk with a quick steady gait after meclizine.  Pt is feeling better.  2:14 PM MRI read pending and second troponin.  3:35 PM mRI without acute findings and repeat troponin normal.  Will d/c pt home.  Gwyneth Sprout, MD 06/07/13 1535  Gwyneth Sprout, MD 06/07/13 1537

## 2013-06-07 NOTE — ED Notes (Signed)
Patient transported to X-ray 

## 2013-06-07 NOTE — ED Notes (Signed)
Pt returned from MRI °

## 2013-06-07 NOTE — ED Notes (Signed)
Pt states she woke up at 0530 this morning felling dizzy and numbness in knees and arms.  Feels uncoordinated.  Feels pressure in head, family states that she is unable to use the phone on the way over here, she is normally able to .  Patient feels delayed in terms of thinking

## 2013-11-26 DIAGNOSIS — M810 Age-related osteoporosis without current pathological fracture: Secondary | ICD-10-CM | POA: Diagnosis not present

## 2013-11-26 DIAGNOSIS — E063 Autoimmune thyroiditis: Secondary | ICD-10-CM | POA: Diagnosis not present

## 2013-11-26 DIAGNOSIS — R7301 Impaired fasting glucose: Secondary | ICD-10-CM | POA: Diagnosis not present

## 2013-11-26 DIAGNOSIS — I10 Essential (primary) hypertension: Secondary | ICD-10-CM | POA: Diagnosis not present

## 2013-11-26 DIAGNOSIS — E785 Hyperlipidemia, unspecified: Secondary | ICD-10-CM | POA: Diagnosis not present

## 2013-12-04 DIAGNOSIS — Z1331 Encounter for screening for depression: Secondary | ICD-10-CM | POA: Diagnosis not present

## 2013-12-04 DIAGNOSIS — M810 Age-related osteoporosis without current pathological fracture: Secondary | ICD-10-CM | POA: Diagnosis not present

## 2013-12-04 DIAGNOSIS — E063 Autoimmune thyroiditis: Secondary | ICD-10-CM | POA: Diagnosis not present

## 2013-12-04 DIAGNOSIS — Z0000 Encounter for general adult medical examination without abnormal findings: Secondary | ICD-10-CM | POA: Diagnosis not present

## 2013-12-04 DIAGNOSIS — I10 Essential (primary) hypertension: Secondary | ICD-10-CM | POA: Diagnosis not present

## 2013-12-04 DIAGNOSIS — E785 Hyperlipidemia, unspecified: Secondary | ICD-10-CM | POA: Diagnosis not present

## 2013-12-04 DIAGNOSIS — R7301 Impaired fasting glucose: Secondary | ICD-10-CM | POA: Diagnosis not present

## 2013-12-04 DIAGNOSIS — Z6826 Body mass index (BMI) 26.0-26.9, adult: Secondary | ICD-10-CM | POA: Diagnosis not present

## 2014-01-02 DIAGNOSIS — Z1212 Encounter for screening for malignant neoplasm of rectum: Secondary | ICD-10-CM | POA: Diagnosis not present

## 2014-01-28 DIAGNOSIS — I10 Essential (primary) hypertension: Secondary | ICD-10-CM | POA: Diagnosis not present

## 2014-01-28 DIAGNOSIS — Z6827 Body mass index (BMI) 27.0-27.9, adult: Secondary | ICD-10-CM | POA: Diagnosis not present

## 2014-01-30 DIAGNOSIS — I10 Essential (primary) hypertension: Secondary | ICD-10-CM | POA: Diagnosis not present

## 2014-12-01 DIAGNOSIS — E785 Hyperlipidemia, unspecified: Secondary | ICD-10-CM | POA: Diagnosis not present

## 2014-12-01 DIAGNOSIS — M810 Age-related osteoporosis without current pathological fracture: Secondary | ICD-10-CM | POA: Diagnosis not present

## 2014-12-01 DIAGNOSIS — I10 Essential (primary) hypertension: Secondary | ICD-10-CM | POA: Diagnosis not present

## 2014-12-01 DIAGNOSIS — R7301 Impaired fasting glucose: Secondary | ICD-10-CM | POA: Diagnosis not present

## 2014-12-01 DIAGNOSIS — Z0000 Encounter for general adult medical examination without abnormal findings: Secondary | ICD-10-CM | POA: Diagnosis not present

## 2014-12-01 DIAGNOSIS — E063 Autoimmune thyroiditis: Secondary | ICD-10-CM | POA: Diagnosis not present

## 2014-12-08 DIAGNOSIS — M810 Age-related osteoporosis without current pathological fracture: Secondary | ICD-10-CM | POA: Diagnosis not present

## 2014-12-08 DIAGNOSIS — Z1389 Encounter for screening for other disorder: Secondary | ICD-10-CM | POA: Diagnosis not present

## 2014-12-08 DIAGNOSIS — R7301 Impaired fasting glucose: Secondary | ICD-10-CM | POA: Diagnosis not present

## 2014-12-08 DIAGNOSIS — D170 Benign lipomatous neoplasm of skin and subcutaneous tissue of head, face and neck: Secondary | ICD-10-CM | POA: Diagnosis not present

## 2014-12-08 DIAGNOSIS — I10 Essential (primary) hypertension: Secondary | ICD-10-CM | POA: Diagnosis not present

## 2014-12-08 DIAGNOSIS — E785 Hyperlipidemia, unspecified: Secondary | ICD-10-CM | POA: Diagnosis not present

## 2014-12-08 DIAGNOSIS — E063 Autoimmune thyroiditis: Secondary | ICD-10-CM | POA: Diagnosis not present

## 2014-12-08 DIAGNOSIS — Z6826 Body mass index (BMI) 26.0-26.9, adult: Secondary | ICD-10-CM | POA: Diagnosis not present

## 2014-12-08 DIAGNOSIS — Z0000 Encounter for general adult medical examination without abnormal findings: Secondary | ICD-10-CM | POA: Diagnosis not present

## 2014-12-23 DIAGNOSIS — Z1212 Encounter for screening for malignant neoplasm of rectum: Secondary | ICD-10-CM | POA: Diagnosis not present

## 2015-11-11 DIAGNOSIS — Z6827 Body mass index (BMI) 27.0-27.9, adult: Secondary | ICD-10-CM | POA: Diagnosis not present

## 2015-11-11 DIAGNOSIS — R1031 Right lower quadrant pain: Secondary | ICD-10-CM | POA: Diagnosis not present

## 2015-11-11 DIAGNOSIS — E063 Autoimmune thyroiditis: Secondary | ICD-10-CM | POA: Diagnosis not present

## 2015-11-11 DIAGNOSIS — M25551 Pain in right hip: Secondary | ICD-10-CM | POA: Diagnosis not present

## 2015-11-11 DIAGNOSIS — I10 Essential (primary) hypertension: Secondary | ICD-10-CM | POA: Diagnosis not present

## 2015-11-26 DIAGNOSIS — Z6826 Body mass index (BMI) 26.0-26.9, adult: Secondary | ICD-10-CM | POA: Diagnosis not present

## 2015-11-26 DIAGNOSIS — K5730 Diverticulosis of large intestine without perforation or abscess without bleeding: Secondary | ICD-10-CM | POA: Diagnosis not present

## 2015-11-26 DIAGNOSIS — R109 Unspecified abdominal pain: Secondary | ICD-10-CM | POA: Diagnosis not present

## 2015-11-26 DIAGNOSIS — N2889 Other specified disorders of kidney and ureter: Secondary | ICD-10-CM | POA: Diagnosis not present

## 2015-11-26 DIAGNOSIS — Z9049 Acquired absence of other specified parts of digestive tract: Secondary | ICD-10-CM | POA: Diagnosis not present

## 2015-11-30 ENCOUNTER — Emergency Department (HOSPITAL_COMMUNITY)
Admission: EM | Admit: 2015-11-30 | Discharge: 2015-11-30 | Discharge status: Absent without leave | Payer: Medicare Other

## 2015-11-30 ENCOUNTER — Telehealth: Payer: Self-pay | Admitting: Physician Assistant

## 2015-11-30 NOTE — Telephone Encounter (Signed)
Call back to [facility], referral coordinator. Requested call back to discuss. No openings on schedules for the rest of the week. Can present the information to the Doc of the Day.

## 2015-11-30 NOTE — ED Notes (Signed)
Patient left without being seen due to wait time

## 2015-12-02 DIAGNOSIS — R7301 Impaired fasting glucose: Secondary | ICD-10-CM | POA: Diagnosis not present

## 2015-12-02 DIAGNOSIS — M810 Age-related osteoporosis without current pathological fracture: Secondary | ICD-10-CM | POA: Diagnosis not present

## 2015-12-02 DIAGNOSIS — E784 Other hyperlipidemia: Secondary | ICD-10-CM | POA: Diagnosis not present

## 2015-12-02 DIAGNOSIS — I10 Essential (primary) hypertension: Secondary | ICD-10-CM | POA: Diagnosis not present

## 2015-12-07 ENCOUNTER — Encounter: Payer: Self-pay | Admitting: *Deleted

## 2015-12-09 ENCOUNTER — Ambulatory Visit (INDEPENDENT_AMBULATORY_CARE_PROVIDER_SITE_OTHER): Payer: Medicare Other | Admitting: Physician Assistant

## 2015-12-09 ENCOUNTER — Encounter: Payer: Self-pay | Admitting: Physician Assistant

## 2015-12-09 VITALS — BP 166/90 | HR 96 | Ht 65.0 in | Wt 160.4 lb

## 2015-12-09 DIAGNOSIS — Z1389 Encounter for screening for other disorder: Secondary | ICD-10-CM | POA: Diagnosis not present

## 2015-12-09 DIAGNOSIS — E038 Other specified hypothyroidism: Secondary | ICD-10-CM | POA: Diagnosis not present

## 2015-12-09 DIAGNOSIS — K5909 Other constipation: Secondary | ICD-10-CM

## 2015-12-09 DIAGNOSIS — R1084 Generalized abdominal pain: Secondary | ICD-10-CM | POA: Diagnosis not present

## 2015-12-09 DIAGNOSIS — E039 Hypothyroidism, unspecified: Secondary | ICD-10-CM

## 2015-12-09 DIAGNOSIS — I10 Essential (primary) hypertension: Secondary | ICD-10-CM | POA: Diagnosis not present

## 2015-12-09 DIAGNOSIS — E784 Other hyperlipidemia: Secondary | ICD-10-CM | POA: Diagnosis not present

## 2015-12-09 DIAGNOSIS — N390 Urinary tract infection, site not specified: Secondary | ICD-10-CM | POA: Diagnosis not present

## 2015-12-09 DIAGNOSIS — K5900 Constipation, unspecified: Secondary | ICD-10-CM | POA: Diagnosis not present

## 2015-12-09 DIAGNOSIS — Z0000 Encounter for general adult medical examination without abnormal findings: Secondary | ICD-10-CM | POA: Diagnosis not present

## 2015-12-09 DIAGNOSIS — Z6826 Body mass index (BMI) 26.0-26.9, adult: Secondary | ICD-10-CM | POA: Diagnosis not present

## 2015-12-09 DIAGNOSIS — R7301 Impaired fasting glucose: Secondary | ICD-10-CM | POA: Diagnosis not present

## 2015-12-09 DIAGNOSIS — M810 Age-related osteoporosis without current pathological fracture: Secondary | ICD-10-CM | POA: Diagnosis not present

## 2015-12-09 MED ORDER — NA SULFATE-K SULFATE-MG SULF 17.5-3.13-1.6 GM/177ML PO SOLN: 1.0000 | Freq: Once | ORAL | Status: AC

## 2015-12-09 NOTE — Progress Notes (Signed)
Agree with initial assessment and plans as outlined 

## 2015-12-09 NOTE — Progress Notes (Signed)
Patient ID: Shannon Ellis, female   DOB: [birth year], 82 y.o.   MRN: [id number]   Subjective:    Patient ID: Shannon Ellis, female    DOB: [birth year], 82 y.o.   MRN: [id number]  HPI  Shannon Ellis is a very nice 82 year old white female, new to GI today referred by Dr. Brigitte Pulse for evaluation of abdominal pain and new constipation. 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 She says she had a sharp pain that shot up from her right leg into her right hip and then into her abdomen. This was associated with a twisted - Knotted -type of sensation in her abdomen. She started taking Advil for pain relief and then became constipated. She says she was going as many as 3-4 days without a bowel movement and was uncomfortable. This became worse, and she was having some discomfort continued discomfort at night time keeping her from sleeping. She had an ER visit on 6/13 but left without being seen. She had undergone CT of the abdomen and pelvis on 11/26/2015 without IV contrast that showed diverticulosis without diverticulitis and a tiny left renal cyst otherwise negative. Labs done per Dr. Raul Del office were also unremarkable. Says the abdominal discomfort felt like a burning irritated skin type of feeling and was present on both sides of her abdomen and different places at different times. Line She's feeling significantly better at this point and is not having any significant discomfort in her right leg her hip either. She's been taking Senokot and her bowels are moving much better.  She has never had colonoscopy.  Review of Systems Pertinent positive and negative review of systems were noted in the above HPI section.  All other review of systems was otherwise negative.  Outpatient Encounter Prescriptions as of 12/09/2015  Medication Sig  . ALPRAZolam (XANAX) 0.25 MG tablet Take 0.25 mg by mouth at bedtime.  Marland Kitchen amLODipine (NORVASC) 2.5 MG tablet Take 2.5 mg by mouth daily.   . B  Complex Vitamins (VITAMIN B COMPLEX PO) Take 1 tablet by mouth daily.    Marland Kitchen ibuprofen (ADVIL,MOTRIN) 200 MG tablet Take 200 mg by mouth daily as needed. For pain   . levothyroxine (SYNTHROID, LEVOTHROID) 100 MCG tablet Take 100 mcg by mouth daily.   Marland Kitchen loratadine (CLARITIN) 10 MG tablet Take 10 mg by mouth daily as needed for allergies (for congestion).   . meclizine (ANTIVERT) 25 MG tablet Take 1 tablet (25 mg total) by mouth 2 (two) times daily as needed for dizziness.  . Multiple Vitamins-Minerals (MULTIVITAMINS THER. W/MINERALS) TABS Take 1 tablet by mouth daily.    Marland Kitchen olmesartan (BENICAR) 40 MG tablet Take 40 mg by mouth daily.   Bethann Humble Sulfate (EYE DROPS ALLERGY RELIEF OP) Apply 1 drop to eye daily as needed (allergies).  . Na Sulfate-K Sulfate-Mg Sulf 17.5-3.13-1.6 GM/180ML SOLN Take 1 kit by mouth once.   No facility-administered encounter medications on file as of 12/09/2015.   No Known Allergies Patient Active Problem List   Diagnosis Date Noted  . HTN (hypertension) 12/09/2015  . Hypothyroidism 12/09/2015   Social History   Social History  . Marital Status: Single    Spouse Name: N/A  . Number of Children: 4  . Years of Education: N/A   Occupational History  . retired    Social History Main Topics  . Smoking status: Never Smoker   . Smokeless tobacco: Never Used  . Alcohol Use: No  . Drug  Use: No  . Sexual Activity: Not on file   Other Topics Concern  . Not on file   Social History Narrative    Ms. Rease's family history includes Breast cancer in her sister; Diabetes in her mother and sister.      Objective:    Filed Vitals:   12/09/15 0953  BP: 166/90  Pulse: 96    Physical Exam  well-developed elderly white female in no acute distress, pleasant blood pressure 166/90 pulse 96, BMI 26.6. HEENT; nontraumatic normocephalic EOMI PERRLA sclera anicteric, Cardiovascular; regular rate and rhythm with S1-S2 no murmur or gallop, Pulmonary ;clear  bilaterally, Abdomen ;soft nontender nondistended no palpable mass or hepatosplenomegaly bowel sounds are present family not done, Extremities ;no clubbing cyanosis or edema skin warm and dry, Neuropsych; mood and affect appropriate     Assessment & Plan:   #47 82 year old female with recent acute episode of right anterior thigh shooting pain associated with acute rather generalized abdominal discomfort of a burning nature and then new onset constipation. I suspect she had a musculoskeletal injury, perhaps an abdominal wall strain which is now resolving. With burning nature of symptoms she may have also had neuropathic pain which is now resolving.  #2 new onset constipation-perhaps related to above injury or regular doses of NSAIDs-not rule out occult lesion #3 prior right hip fracture #4 hypertension #5 hypothyroid #6 diverticulosis  Plan; Patient is encouraged to decrease use of NSAIDs as this may be contributing to her constipation She will continue Senokot or MiraLAX 17 g grams in 8 ounces of water at least every other day We will schedule for colonoscopy with Dr. Henrene Pastor. Procedure discussed in detail with patient including risks and benefits and she is agreeable to proceed.  Amy Genia Harold PA-C 12/09/2015   Cc: Marton Redwood, MD

## 2015-12-09 NOTE — Patient Instructions (Addendum)
You have been scheduled for a colonoscopy. Please follow written instructions given to you at your visit today.  Please pick up your prep supplies at the pharmacy within the next 1-3 days. [facility].   If you use inhalers (even only as needed), please bring them with you on the day of your procedure. Your physician has requested that you go to www.startemmi.com and enter the access code given to you at your visit today. This web site gives a general overview about your procedure. However, you should still follow specific instructions given to you by our office regarding your preparation for the procedure.  Continue Miralax or senekot daily or every other day.  Push water intake.   If you are age 20 or older, your body mass index should be between 23-30. Your Body mass index is 26.69 kg/(m^2). If this is out of the aforementioned range listed, please consider follow up with your Primary Care Provider.

## 2015-12-28 ENCOUNTER — Ambulatory Visit (AMBULATORY_SURGERY_CENTER): Payer: Medicare Other | Admitting: Internal Medicine

## 2015-12-28 ENCOUNTER — Encounter: Payer: Self-pay | Admitting: Internal Medicine

## 2015-12-28 VITALS — BP 163/90 | HR 87 | Temp 97.1°F | Resp 21 | Ht 65.0 in | Wt 160.0 lb

## 2015-12-28 DIAGNOSIS — K5900 Constipation, unspecified: Secondary | ICD-10-CM

## 2015-12-28 DIAGNOSIS — D125 Benign neoplasm of sigmoid colon: Secondary | ICD-10-CM

## 2015-12-28 DIAGNOSIS — Z1211 Encounter for screening for malignant neoplasm of colon: Secondary | ICD-10-CM

## 2015-12-28 MED ORDER — SODIUM CHLORIDE 0.9 % IV SOLN: 500.0000 mL | INTRAVENOUS | Status: DC

## 2015-12-28 NOTE — Progress Notes (Signed)
A and O x3. Report to RN. Tolerated MAC anesthesia well. 

## 2015-12-28 NOTE — Op Note (Signed)
[facility] Patient Name: Shannon Ellis Procedure Date: 12/28/2015 11:48 AM MRN: [id number] Endoscopist: Docia Chuck. Henrene Pastor , MD Age: 82 Referring MD:  Date of Birth: [birth year] Gender: Female Account #: [id number] Procedure:                Colonoscopy, with cold snare polypectomy x 1 Indications:              Screening for colorectal malignant neoplasm Medicines:                Monitored Anesthesia Care Procedure:                Pre-Anesthesia Assessment:                           - Prior to the procedure, a History and Physical                            was performed, and patient medications and                            allergies were reviewed. The patient's tolerance of                            previous anesthesia was also reviewed. The risks                            and benefits of the procedure and the sedation                            options and risks were discussed with the patient.                            All questions were answered, and informed consent                            was obtained. Prior Anticoagulants: The patient has                            taken no previous anticoagulant or antiplatelet                            agents. ASA Grade Assessment: II - A patient with                            mild systemic disease. After reviewing the risks                            and benefits, the patient was deemed in                            satisfactory condition to undergo the procedure.                           After obtaining informed consent, the colonoscope  was passed under direct vision. Throughout the                            procedure, the patient's blood pressure, pulse, and                            oxygen saturations were monitored continuously. The                            Model CF-HQ190L [id number]) scope was introduced                            through the anus and advanced to the the cecum,                 identified by appendiceal orifice and ileocecal                            valve. The ileocecal valve, appendiceal orifice,                            and rectum were photographed. The quality of the                            bowel preparation was good. The colonoscopy was                            performed without difficulty. The patient tolerated                            the procedure well. The bowel preparation used was                            SUPREP. Scope In: 12:09:48 PM Scope Out: 12:23:23 PM Scope Withdrawal Time: 0 hours 7 minutes 50 seconds  Total Procedure Duration: 0 hours 13 minutes 35 seconds  Findings:                 A 5 mm polyp was found in the sigmoid colon. The                            polyp was removed with a cold snare. Resection and                            retrieval were complete.                           Multiple diverticula were found in the sigmoid                            colon.                           The exam was otherwise without abnormality on  direct and retroflexion views. Complications:            No immediate complications. Estimated blood loss:                            None. Estimated Blood Loss:     Estimated blood loss: none. Impression:               - One 5 mm polyp in the sigmoid colon, removed with                            a cold snare. Resected and retrieved.                           - Diverticulosis in the sigmoid colon.                           - The examination was otherwise normal on direct                            and retroflexion views. Recommendation:           - Repeat colonoscopy is not recommended for                            surveillance.                           - Patient has a contact number available for                            emergencies. The signs and symptoms of potential                            delayed complications were discussed with the                             patient. Return to normal activities tomorrow.                            Written discharge instructions were provided to the                            patient.                           - Resume previous diet.                           - Continue present medications.                           - Await pathology results. Docia Chuck. Henrene Pastor, MD 12/28/2015 12:27:33 PM This report has been signed electronically.

## 2015-12-28 NOTE — Progress Notes (Signed)
Called to room to assist during endoscopic procedure.  Patient ID and intended procedure confirmed with present staff. Received instructions for my participation in the procedure from the performing physician.  

## 2015-12-28 NOTE — Patient Instructions (Signed)
YOU HAD AN ENDOSCOPIC PROCEDURE TODAY AT THE [facility]:   Refer to the procedure report that was given to you for any specific questions about what was found during the examination.  If the procedure report does not answer your questions, please call your gastroenterologist to clarify.  If you requested that your care partner not be given the details of your procedure findings, then the procedure report has been included in a sealed envelope for you to review at your convenience later.  YOU SHOULD EXPECT: Some feelings of bloating in the abdomen. Passage of more gas than usual.  Walking can help get rid of the air that was put into your GI tract during the procedure and reduce the bloating. If you had a lower endoscopy (such as a colonoscopy or flexible sigmoidoscopy) you may notice spotting of blood in your stool or on the toilet paper. If you underwent a bowel prep for your procedure, you may not have a normal bowel movement for a few days.  Please Note:  You might notice some irritation and congestion in your nose or some drainage.  This is from the oxygen used during your procedure.  There is no need for concern and it should clear up in a day or so.  SYMPTOMS TO REPORT IMMEDIATELY:   Following lower endoscopy (colonoscopy or flexible sigmoidoscopy):  Excessive amounts of blood in the stool  Significant tenderness or worsening of abdominal pains  Swelling of the abdomen that is new, acute  Fever of 100F or higher   For urgent or emergent issues, a gastroenterologist can be reached at any hour by calling [phone].   DIET: Your first meal following the procedure should be a small meal and then it is ok to progress to your normal diet. Heavy or fried foods are harder to digest and may make you feel nauseous or bloated.  Likewise, meals heavy in dairy and vegetables can increase bloating.  Drink plenty of fluids but you should avoid alcoholic beverages for 24  hours.  ACTIVITY:  You should plan to take it easy for the rest of today and you should NOT DRIVE or use heavy machinery until tomorrow (because of the sedation medicines used during the test).    FOLLOW UP: Our staff will call the number listed on your records the next business day following your procedure to check on you and address any questions or concerns that you may have regarding the information given to you following your procedure. If we do not reach you, we will leave a message.  However, if you are feeling well and you are not experiencing any problems, there is no need to return our call.  We will assume that you have returned to your regular daily activities without incident.  If any biopsies were taken you will be contacted by phone or by letter within the next 1-3 weeks.  Please call us at [phone] if you have not heard about the biopsies in 3 weeks.    SIGNATURES/CONFIDENTIALITY: You and/or your care partner have signed paperwork which will be entered into your electronic medical record.  These signatures attest to the fact that that the information above on your After Visit Summary has been reviewed and is understood.  Full responsibility of the confidentiality of this discharge information lies with you and/or your care-partner. 

## 2015-12-29 ENCOUNTER — Telehealth: Payer: Self-pay | Admitting: *Deleted

## 2015-12-29 NOTE — Telephone Encounter (Signed)
  Follow up Call-  Call back number 12/28/2015  Post procedure Call Back phone  # [phone]  Permission to leave phone message Yes     Patient questions:  Do you have a fever, pain , or abdominal swelling? No. Pain Score  0 *  Have you tolerated food without any problems? Yes.    Have you been able to return to your normal activities? Yes.    Do you have any questions about your discharge instructions: Diet   No. Medications  No. Follow up visit  No.  Do you have questions or concerns about your Care? No.  Actions: * If pain score is 4 or above: No action needed, pain <4.

## 2016-01-03 ENCOUNTER — Encounter: Payer: Self-pay | Admitting: Internal Medicine

## 2016-04-19 DIAGNOSIS — E038 Other specified hypothyroidism: Secondary | ICD-10-CM | POA: Diagnosis not present

## 2016-09-27 DIAGNOSIS — E038 Other specified hypothyroidism: Secondary | ICD-10-CM | POA: Diagnosis not present

## 2016-10-30 DIAGNOSIS — R0602 Shortness of breath: Secondary | ICD-10-CM | POA: Diagnosis not present

## 2016-10-30 DIAGNOSIS — R110 Nausea: Secondary | ICD-10-CM | POA: Diagnosis not present

## 2016-10-30 DIAGNOSIS — E038 Other specified hypothyroidism: Secondary | ICD-10-CM | POA: Diagnosis not present

## 2016-10-30 DIAGNOSIS — R748 Abnormal levels of other serum enzymes: Secondary | ICD-10-CM | POA: Diagnosis not present

## 2016-10-30 DIAGNOSIS — R6889 Other general symptoms and signs: Secondary | ICD-10-CM | POA: Diagnosis not present

## 2016-10-30 DIAGNOSIS — I10 Essential (primary) hypertension: Secondary | ICD-10-CM | POA: Diagnosis not present

## 2016-10-31 ENCOUNTER — Other Ambulatory Visit: Payer: Self-pay | Admitting: Internal Medicine

## 2016-10-31 DIAGNOSIS — R110 Nausea: Secondary | ICD-10-CM

## 2016-10-31 DIAGNOSIS — R748 Abnormal levels of other serum enzymes: Secondary | ICD-10-CM | POA: Diagnosis not present

## 2016-11-07 ENCOUNTER — Ambulatory Visit
Admission: RE | Admit: 2016-11-07 | Discharge: 2016-11-07 | Disposition: A | Discharge status: Home / Self Care | Payer: Medicare Other | Source: Ambulatory Visit | Attending: Internal Medicine | Admitting: Internal Medicine

## 2016-11-07 DIAGNOSIS — R748 Abnormal levels of other serum enzymes: Secondary | ICD-10-CM

## 2016-11-07 DIAGNOSIS — R110 Nausea: Secondary | ICD-10-CM

## 2016-11-20 ENCOUNTER — Emergency Department (HOSPITAL_COMMUNITY)
Admission: EM | Admit: 2016-11-20 | Discharge: 2016-11-20 | Disposition: A | Discharge status: Home / Self Care | Payer: Medicare Other | Attending: Emergency Medicine | Admitting: Emergency Medicine

## 2016-11-20 ENCOUNTER — Encounter (HOSPITAL_COMMUNITY): Payer: Self-pay

## 2016-11-20 ENCOUNTER — Emergency Department (HOSPITAL_COMMUNITY): Payer: Medicare Other

## 2016-11-20 DIAGNOSIS — R531 Weakness: Secondary | ICD-10-CM | POA: Insufficient documentation

## 2016-11-20 DIAGNOSIS — E039 Hypothyroidism, unspecified: Secondary | ICD-10-CM | POA: Diagnosis not present

## 2016-11-20 DIAGNOSIS — Z79899 Other long term (current) drug therapy: Secondary | ICD-10-CM | POA: Insufficient documentation

## 2016-11-20 DIAGNOSIS — R030 Elevated blood-pressure reading, without diagnosis of hypertension: Secondary | ICD-10-CM | POA: Diagnosis not present

## 2016-11-20 DIAGNOSIS — Z96641 Presence of right artificial hip joint: Secondary | ICD-10-CM | POA: Insufficient documentation

## 2016-11-20 DIAGNOSIS — I10 Essential (primary) hypertension: Secondary | ICD-10-CM | POA: Diagnosis not present

## 2016-11-20 DIAGNOSIS — R1111 Vomiting without nausea: Secondary | ICD-10-CM | POA: Diagnosis not present

## 2016-11-20 LAB — COMPREHENSIVE METABOLIC PANEL
ALBUMIN: 3.9 g/dL (ref 3.5–5.0)
ALT: 16 U/L (ref 14–54)
ANION GAP: 9 (ref 5–15)
AST: 31 U/L (ref 15–41)
Alkaline Phosphatase: 126 U/L (ref 38–126)
BILIRUBIN TOTAL: 0.7 mg/dL (ref 0.3–1.2)
BUN: 9 mg/dL (ref 6–20)
CHLORIDE: 105 mmol/L (ref 101–111)
CO2: 22 mmol/L (ref 22–32)
Calcium: 9.1 mg/dL (ref 8.9–10.3)
Creatinine, Ser: 0.7 mg/dL (ref 0.44–1.00)
GFR calc Af Amer: >60 mL/min (ref >60)
GFR calc non Af Amer: >60 mL/min (ref >60)
GLUCOSE: 153 mg/dL — AB (ref 65–99)
POTASSIUM: 3.6 mmol/L (ref 3.5–5.1)
Sodium: 136 mmol/L (ref 135–145)
TOTAL PROTEIN: 7.1 g/dL (ref 6.5–8.1)

## 2016-11-20 LAB — I-STAT TROPONIN, ED: TROPONIN I, POC: 0 ng/mL (ref 0.00–0.08)

## 2016-11-20 LAB — CBC WITH DIFFERENTIAL/PLATELET
BASOS ABS: 0 10*3/uL (ref 0.0–0.1)
Basophils Relative: 0 %
Eosinophils Absolute: 0 10*3/uL (ref 0.0–0.7)
Eosinophils Relative: 0 %
HEMATOCRIT: 38.9 % (ref 36.0–46.0)
Hemoglobin: 12.6 g/dL (ref 12.0–15.0)
LYMPHS ABS: 1 10*3/uL (ref 0.7–4.0)
LYMPHS PCT: 11 %
MCH: 28 pg (ref 26.0–34.0)
MCHC: 32.4 g/dL (ref 30.0–36.0)
MCV: 86.4 fL (ref 78.0–100.0)
MONO ABS: 0.3 10*3/uL (ref 0.1–1.0)
Monocytes Relative: 3 %
Neutro Abs: 7.7 10*3/uL (ref 1.7–7.7)
Neutrophils Relative %: 86 %
Platelets: 316 10*3/uL (ref 150–400)
RBC: 4.5 MIL/uL (ref 3.87–5.11)
RDW: 14.3 % (ref 11.5–15.5)
WBC: 9 10*3/uL (ref 4.0–10.5)

## 2016-11-20 MED ORDER — METOCLOPRAMIDE HCL 5 MG/ML IJ SOLN
5.0000 mg | Freq: Once | INTRAMUSCULAR | Status: AC
  Administered 2016-11-20: 5 mg via INTRAVENOUS
  Filled 2016-11-20: qty 2

## 2016-11-20 MED ORDER — LORAZEPAM 2 MG/ML IJ SOLN
0.5000 mg | Freq: Once | INTRAMUSCULAR | Status: AC
  Administered 2016-11-20: 0.5 mg via INTRAVENOUS
  Filled 2016-11-20: qty 1

## 2016-11-20 NOTE — ED Notes (Signed)
EDP at bedside  

## 2016-11-20 NOTE — ED Triage Notes (Signed)
Pt. Coming from home via [facility] for hypertension. Pt. BP 220/108 upon ems arrival. Pt. Recently changed BP meds. No meds taken today. Pt. States she woke up and felt like her head was heavy. Pt. Attempted to stand up and felt nauseous. Pt. Given 4mg  zofran en route. Pt. Aox4.

## 2016-11-20 NOTE — ED Provider Notes (Signed)
[facility] DEPT Provider Note   CSN: [id number] Arrival date & time: 11/20/16  7494     History   Chief Complaint Chief Complaint  Patient presents with  . Hypertension    HPI Shannon Ellis is a 83 y.o. female.Plains of feeling off balance, generalized weakness and like her head is "heavy" onset upon awakening 5 AM today and she also reports a vague feeling of a visual field cut in her left eye. No difficulty speaking. No difficulty with walking.. Associated symptoms include nausea. She was treated by EMS with Zofran prior to coming here. Nausea has somewhat improved but continues. She denies any chest pain or shortness of breath.  HPI  Past Medical History:  Diagnosis Date  . Allergy   . Anemia   . Blood transfusion without reported diagnosis   . Hypertension   . Hypothyroidism   . Osteoporosis 10/07   Dexa scan    Patient Active Problem List   Diagnosis Date Noted  . HTN (hypertension) 12/09/2015  . Hypothyroidism 12/09/2015    Past Surgical History:  Procedure Laterality Date  . APPENDECTOMY    . THYROIDECTOMY    . TONSILLECTOMY    . TOTAL HIP ARTHROPLASTY Right     OB History    No data available       Home Medications    Prior to Admission medications   Medication Sig Start Date End Date Taking? Authorizing Provider  ALPRAZolam (XANAX) 0.25 MG tablet Take 0.25 mg by mouth at bedtime. 11/26/15   [provider]  amLODipine (NORVASC) 2.5 MG tablet Take 2.5 mg by mouth daily.  03/07/13   [provider]  B Complex Vitamins (VITAMIN B COMPLEX PO) Take 1 tablet by mouth daily.      [provider]  ibuprofen (ADVIL,MOTRIN) 200 MG tablet Take 200 mg by mouth daily as needed. For pain     [provider]  levothyroxine (SYNTHROID, LEVOTHROID) 100 MCG tablet Take 100 mcg by mouth daily.     [provider]  loratadine (CLARITIN) 10 MG tablet Take 10 mg by mouth daily as needed for allergies (for congestion).      [provider]  meclizine (ANTIVERT) 25 MG tablet Take 1 tablet (25 mg total) by mouth 2 (two) times daily as needed for dizziness. 06/07/13   Blanchie Dessert, MD  Multiple Vitamins-Minerals (MULTIVITAMINS THER. W/MINERALS) TABS Take 1 tablet by mouth daily.      [provider]  olmesartan (BENICAR) 40 MG tablet Take 40 mg by mouth daily.     [provider]  Tetrahydrozoline-Zn Sulfate (EYE DROPS ALLERGY RELIEF OP) Apply 1 drop to eye daily as needed (allergies).    [provider]    Family History Family History  Problem Relation Age of Onset  . Diabetes Mother   . Breast cancer Sister   . Diabetes Sister     Social History Social History  Substance Use Topics  . Smoking status: Never Smoker  . Smokeless tobacco: Never Used  . Alcohol use No     Allergies   Patient has no known allergies.   Review of Systems Review of Systems  HENT: Positive for hearing loss.        Chronically hard of hearing  Eyes: Positive for visual disturbance.  Respiratory: Negative.   Cardiovascular: Negative.   Gastrointestinal: Positive for nausea.  Musculoskeletal: Negative.   Skin: Negative.   Neurological: Positive for weakness.       Generalized weakness  Psychiatric/Behavioral: Negative.   All other systems reviewed and are negative.    Physical Exam Updated Vital Signs BP (!) 178/91   Pulse 92   Temp 98.8 F (37.1 C) (Oral)   Resp 16   Ht 5\' 6"  (1.676 m)   Wt 74.8 kg (165 lb)   SpO2 97%   BMI 26.63 kg/m   Physical Exam  Constitutional: She is oriented to person, place, and time. She appears well-developed and well-nourished. No distress.  HENT:  Head: Normocephalic and atraumatic.  Eyes: Conjunctivae are normal. Pupils are equal, round, and reactive to light.  Visual fields grossly normal bilaterally  Neck: Neck supple. No tracheal deviation present. No thyromegaly present.  No bruit  Cardiovascular: Normal rate and regular  rhythm.   No murmur heard. Pulmonary/Chest: Effort normal and breath sounds normal.  Abdominal: Soft. Bowel sounds are normal. She exhibits no distension. There is no tenderness.  Musculoskeletal: Normal range of motion. She exhibits no edema or tenderness.  Neurological: She is alert and oriented to person, place, and time. She displays normal reflexes. No cranial nerve deficit. Coordination normal.  Gait normal finger to nose normal. DTR symmetric bilaterally at knee jerk ankle jerk and biceps toes downward going bilaterally. Pronator drift normal  Skin: Skin is warm and dry. No rash noted.  Psychiatric: She has a normal mood and affect.  Nursing note and vitals reviewed.    ED Treatments / Results  Labs (all labs ordered are listed, but only abnormal results are displayed) Labs Reviewed - No data to display  EKG  EKG Interpretation  Date/Time:  Monday November 20 2016 10:05:11 EDT Ventricular Rate:  96 PR Interval:    QRS Duration: 101 QT Interval:  387 QTC Calculation: 490 R Axis:   38 Text Interpretation:  Sinus rhythm Borderline prolonged PR interval Low voltage, precordial leads Borderline prolonged QT interval No significant change since last tracing Confirmed by Orlie Dakin [phone]) on 11/20/2016 11:18:11 AM Also confirmed by Orlie Dakin [phone]), editor Drema Pry [phone])  on 11/20/2016 12:01:00 PM      Results for orders placed or performed during the hospital encounter of 11/20/16  Comprehensive metabolic panel  Result Value Ref Range   Sodium 136 135 - 145 mmol/L   Potassium 3.6 3.5 - 5.1 mmol/L   Chloride 105 101 - 111 mmol/L   CO2 22 22 - 32 mmol/L   Glucose, Bld 153 (H) 65 - 99 mg/dL   BUN 9 6 - 20 mg/dL   Creatinine, Ser 0.70 0.44 - 1.00 mg/dL   Calcium 9.1 8.9 - 10.3 mg/dL   Total Protein 7.1 6.5 - 8.1 g/dL   Albumin 3.9 3.5 - 5.0 g/dL   AST 31 15 - 41 U/L   ALT 16 14 - 54 U/L   Alkaline Phosphatase 126 38 - 126 U/L   Total Bilirubin 0.7 0.3  - 1.2 mg/dL   GFR calc non Af Amer >60 >60 mL/min   GFR calc Af Amer >60 >60 mL/min   Anion gap 9 5 - 15  CBC with Differential/Platelet  Result Value Ref Range   WBC 9.0 4.0 - 10.5 K/uL   RBC 4.50 3.87 - 5.11 MIL/uL   Hemoglobin 12.6 12.0 - 15.0 g/dL   HCT 38.9 36.0 - 46.0 %   MCV 86.4 78.0 - 100.0 fL   MCH 28.0 26.0 - 34.0 pg   MCHC 32.4 30.0 - 36.0 g/dL   RDW 14.3 11.5 - 15.5 %   Platelets  316 150 - 400 K/uL   Neutrophils Relative % 86 %   Neutro Abs 7.7 1.7 - 7.7 K/uL   Lymphocytes Relative 11 %   Lymphs Abs 1.0 0.7 - 4.0 K/uL   Monocytes Relative 3 %   Monocytes Absolute 0.3 0.1 - 1.0 K/uL   Eosinophils Relative 0 %   Eosinophils Absolute 0.0 0.0 - 0.7 K/uL   Basophils Relative 0 %   Basophils Absolute 0.0 0.0 - 0.1 K/uL  I-stat troponin, ED  Result Value Ref Range   Troponin i, poc 0.00 0.00 - 0.08 ng/mL   Comment 3           Mr Brain Wo Contrast  Result Date: 11/20/2016 CLINICAL DATA:  Elevated blood pressure, recent medication change. Nausea. EXAM: MRI HEAD WITHOUT CONTRAST TECHNIQUE: Multiplanar, multiecho pulse sequences of the brain and surrounding structures were obtained without intravenous contrast. COMPARISON:  06/07/2013 MRI FINDINGS: The patient was unable to remain motionless for the exam. Small or subtle lesions could be overlooked. Brain: No evidence for acute infarction, hemorrhage, mass lesion, hydrocephalus, or extra-axial fluid. Normal for age cerebral volume. Moderately advanced T2 and FLAIR hyperintensities throughout the white matter, likely chronic microvascular ischemic change. No features suggestive of PRES. Vascular: Flow voids are maintained throughout the carotid, basilar, and vertebral arteries. There are no areas of chronic hemorrhage. Skull and upper cervical spine: Unremarkable visualized calvarium, skullbase, and cervical vertebrae. Pituitary, pineal, cerebellar tonsils unremarkable. No upper cervical cord lesions. Sinuses/Orbits: No orbital masses  or proptosis. Globes appear symmetric. Sinuses appear well aerated, without evidence for air-fluid level. Other: No nasopharyngeal pathology or mastoid fluid. Scalp and other visualized extracranial soft tissues grossly unremarkable. IMPRESSION: Atrophy and small vessel disease.  No acute intracranial findings. No evidence for acute stroke, acute or chronic hemorrhage, or signs of hypertensive encephalopathy. Electronically Signed   By: Staci Righter M.D.   On: 11/20/2016 12:34   US Abdomen Limited Ruq  Result Date: 11/07/2016 CLINICAL DATA:  83 year old female with elevated alkaline phosphatase. Initial encounter. EXAM: US ABDOMEN LIMITED - RIGHT UPPER QUADRANT COMPARISON:  02/19/2013 CT. FINDINGS: Gallbladder: No gallstones or wall thickening visualized. No sonographic Murphy sign noted by sonographer. Common bile duct: Diameter: 3.2 mm Liver: Slightly lobulated contour without definitive findings of cirrhosis. No focal mass identified. Minimal increased echogenicity may represent minimal fatty infiltration. Hepatopetal flow within the portal vein. IMPRESSION: No gallbladder abnormality noted. Slightly lobular contour of the liver without definitive findings of cirrhosis. Question minimal fatty infiltration. Electronically Signed   By: Genia Del M.D.   On: 11/07/2016 14:35   Radiology No results found.  Procedures Procedures (including critical care time)  Medications Ordered in ED Medications - No data to display   Initial Impression / Assessment and Plan / ED Course  I have reviewed the triage vital signs and the nursing notes.  Pertinent labs & imaging results that were available during my care of the patient were reviewed by me and considered in my medical decision making (see chart for details).     2:40 PM patient asymptomatic alert and ambulatory. Doubt anginal equivalent. No evidence of stroke. Plan follow-up Dr. Brigitte Pulse Final Clinical Impressions(s) / ED Diagnoses  Diagnosis  weakness Final diagnoses:  None    New Prescriptions New Prescriptions   No medications on file     Orlie Dakin, MD 11/20/16 1455

## 2016-11-20 NOTE — Discharge Instructions (Signed)
Dr. Raul Del office today or tomorrow to arrange to be seen within the next 7-10 days. Tell office staff that you had testing performed here so that Dr. Brigitte Pulse can check and review. Return if your condition worsens for any reason

## 2016-11-20 NOTE — ED Notes (Signed)
Pt. Transported to MRI at this time.  

## 2016-11-21 DIAGNOSIS — Z1389 Encounter for screening for other disorder: Secondary | ICD-10-CM | POA: Diagnosis not present

## 2016-11-21 DIAGNOSIS — I10 Essential (primary) hypertension: Secondary | ICD-10-CM | POA: Diagnosis not present

## 2016-11-21 DIAGNOSIS — R6889 Other general symptoms and signs: Secondary | ICD-10-CM | POA: Diagnosis not present

## 2016-11-21 DIAGNOSIS — Z6826 Body mass index (BMI) 26.0-26.9, adult: Secondary | ICD-10-CM | POA: Diagnosis not present

## 2016-12-06 DIAGNOSIS — R7301 Impaired fasting glucose: Secondary | ICD-10-CM | POA: Diagnosis not present

## 2016-12-06 DIAGNOSIS — E784 Other hyperlipidemia: Secondary | ICD-10-CM | POA: Diagnosis not present

## 2016-12-06 DIAGNOSIS — I10 Essential (primary) hypertension: Secondary | ICD-10-CM | POA: Diagnosis not present

## 2016-12-06 DIAGNOSIS — M810 Age-related osteoporosis without current pathological fracture: Secondary | ICD-10-CM | POA: Diagnosis not present

## 2016-12-06 DIAGNOSIS — E038 Other specified hypothyroidism: Secondary | ICD-10-CM | POA: Diagnosis not present

## 2016-12-13 DIAGNOSIS — K5909 Other constipation: Secondary | ICD-10-CM | POA: Diagnosis not present

## 2016-12-13 DIAGNOSIS — I208 Other forms of angina pectoris: Secondary | ICD-10-CM | POA: Diagnosis not present

## 2016-12-13 DIAGNOSIS — Z1389 Encounter for screening for other disorder: Secondary | ICD-10-CM | POA: Diagnosis not present

## 2016-12-13 DIAGNOSIS — I10 Essential (primary) hypertension: Secondary | ICD-10-CM | POA: Diagnosis not present

## 2016-12-13 DIAGNOSIS — Z6827 Body mass index (BMI) 27.0-27.9, adult: Secondary | ICD-10-CM | POA: Diagnosis not present

## 2016-12-13 DIAGNOSIS — E784 Other hyperlipidemia: Secondary | ICD-10-CM | POA: Diagnosis not present

## 2016-12-13 DIAGNOSIS — M810 Age-related osteoporosis without current pathological fracture: Secondary | ICD-10-CM | POA: Diagnosis not present

## 2016-12-13 DIAGNOSIS — Z0000 Encounter for general adult medical examination without abnormal findings: Secondary | ICD-10-CM | POA: Diagnosis not present

## 2016-12-13 DIAGNOSIS — R7301 Impaired fasting glucose: Secondary | ICD-10-CM | POA: Diagnosis not present

## 2016-12-13 DIAGNOSIS — E038 Other specified hypothyroidism: Secondary | ICD-10-CM | POA: Diagnosis not present

## 2016-12-13 DIAGNOSIS — R748 Abnormal levels of other serum enzymes: Secondary | ICD-10-CM | POA: Diagnosis not present

## 2017-03-19 DIAGNOSIS — I10 Essential (primary) hypertension: Secondary | ICD-10-CM | POA: Diagnosis not present

## 2017-03-19 DIAGNOSIS — E038 Other specified hypothyroidism: Secondary | ICD-10-CM | POA: Diagnosis not present

## 2017-03-19 DIAGNOSIS — Z6828 Body mass index (BMI) 28.0-28.9, adult: Secondary | ICD-10-CM | POA: Diagnosis not present

## 2017-03-19 DIAGNOSIS — R0600 Dyspnea, unspecified: Secondary | ICD-10-CM | POA: Diagnosis not present

## 2017-03-21 DIAGNOSIS — E038 Other specified hypothyroidism: Secondary | ICD-10-CM | POA: Diagnosis not present

## 2017-03-23 DIAGNOSIS — I10 Essential (primary) hypertension: Secondary | ICD-10-CM | POA: Diagnosis not present

## 2017-04-02 DIAGNOSIS — R0609 Other forms of dyspnea: Secondary | ICD-10-CM | POA: Diagnosis not present

## 2017-04-02 DIAGNOSIS — I10 Essential (primary) hypertension: Secondary | ICD-10-CM | POA: Diagnosis not present

## 2017-04-05 DIAGNOSIS — E871 Hypo-osmolality and hyponatremia: Secondary | ICD-10-CM | POA: Diagnosis not present

## 2017-04-09 DIAGNOSIS — R0600 Dyspnea, unspecified: Secondary | ICD-10-CM | POA: Diagnosis not present

## 2017-04-09 DIAGNOSIS — E871 Hypo-osmolality and hyponatremia: Secondary | ICD-10-CM | POA: Diagnosis not present

## 2017-04-12 DIAGNOSIS — R0600 Dyspnea, unspecified: Secondary | ICD-10-CM | POA: Diagnosis not present

## 2017-04-25 DIAGNOSIS — E871 Hypo-osmolality and hyponatremia: Secondary | ICD-10-CM | POA: Diagnosis not present

## 2017-12-10 DIAGNOSIS — M810 Age-related osteoporosis without current pathological fracture: Secondary | ICD-10-CM | POA: Diagnosis not present

## 2017-12-10 DIAGNOSIS — R7301 Impaired fasting glucose: Secondary | ICD-10-CM | POA: Diagnosis not present

## 2017-12-10 DIAGNOSIS — I10 Essential (primary) hypertension: Secondary | ICD-10-CM | POA: Diagnosis not present

## 2017-12-10 DIAGNOSIS — R82998 Other abnormal findings in urine: Secondary | ICD-10-CM | POA: Diagnosis not present

## 2017-12-10 DIAGNOSIS — E7849 Other hyperlipidemia: Secondary | ICD-10-CM | POA: Diagnosis not present

## 2017-12-10 DIAGNOSIS — E038 Other specified hypothyroidism: Secondary | ICD-10-CM | POA: Diagnosis not present

## 2017-12-11 DIAGNOSIS — B748 Other filariases: Secondary | ICD-10-CM | POA: Diagnosis not present

## 2017-12-17 DIAGNOSIS — K5909 Other constipation: Secondary | ICD-10-CM | POA: Diagnosis not present

## 2017-12-17 DIAGNOSIS — M810 Age-related osteoporosis without current pathological fracture: Secondary | ICD-10-CM | POA: Diagnosis not present

## 2017-12-17 DIAGNOSIS — I208 Other forms of angina pectoris: Secondary | ICD-10-CM | POA: Diagnosis not present

## 2017-12-17 DIAGNOSIS — R748 Abnormal levels of other serum enzymes: Secondary | ICD-10-CM | POA: Diagnosis not present

## 2017-12-17 DIAGNOSIS — R7301 Impaired fasting glucose: Secondary | ICD-10-CM | POA: Diagnosis not present

## 2017-12-17 DIAGNOSIS — E038 Other specified hypothyroidism: Secondary | ICD-10-CM | POA: Diagnosis not present

## 2017-12-17 DIAGNOSIS — I10 Essential (primary) hypertension: Secondary | ICD-10-CM | POA: Diagnosis not present

## 2017-12-17 DIAGNOSIS — Z6827 Body mass index (BMI) 27.0-27.9, adult: Secondary | ICD-10-CM | POA: Diagnosis not present

## 2017-12-17 DIAGNOSIS — Z1389 Encounter for screening for other disorder: Secondary | ICD-10-CM | POA: Diagnosis not present

## 2017-12-17 DIAGNOSIS — E7849 Other hyperlipidemia: Secondary | ICD-10-CM | POA: Diagnosis not present

## 2017-12-17 DIAGNOSIS — Z0000 Encounter for general adult medical examination without abnormal findings: Secondary | ICD-10-CM | POA: Diagnosis not present

## 2017-12-17 DIAGNOSIS — R1909 Other intra-abdominal and pelvic swelling, mass and lump: Secondary | ICD-10-CM | POA: Diagnosis not present

## 2017-12-19 ENCOUNTER — Other Ambulatory Visit: Payer: Self-pay | Admitting: Internal Medicine

## 2017-12-19 DIAGNOSIS — R1909 Other intra-abdominal and pelvic swelling, mass and lump: Secondary | ICD-10-CM

## 2017-12-19 DIAGNOSIS — R748 Abnormal levels of other serum enzymes: Secondary | ICD-10-CM

## 2018-01-02 ENCOUNTER — Ambulatory Visit
Admission: RE | Admit: 2018-01-02 | Discharge: 2018-01-02 | Disposition: A | Discharge status: Home / Self Care | Payer: Medicare Other | Source: Ambulatory Visit | Attending: Internal Medicine | Admitting: Internal Medicine

## 2018-01-02 DIAGNOSIS — R748 Abnormal levels of other serum enzymes: Secondary | ICD-10-CM

## 2018-01-02 DIAGNOSIS — R1909 Other intra-abdominal and pelvic swelling, mass and lump: Secondary | ICD-10-CM

## 2018-01-02 DIAGNOSIS — R1900 Intra-abdominal and pelvic swelling, mass and lump, unspecified site: Secondary | ICD-10-CM | POA: Diagnosis not present

## 2018-01-02 MED ORDER — IOPAMIDOL (ISOVUE-300) INJECTION 61%
100.0000 mL | Freq: Once | INTRAVENOUS | Status: AC | PRN
  Administered 2018-01-02: 100 mL via INTRAVENOUS

## 2018-01-03 DIAGNOSIS — R1909 Other intra-abdominal and pelvic swelling, mass and lump: Secondary | ICD-10-CM | POA: Diagnosis not present

## 2018-01-07 ENCOUNTER — Telehealth: Payer: Self-pay | Admitting: *Deleted

## 2018-01-07 NOTE — Telephone Encounter (Signed)
Scheduled the patient for Thursday at 11am. The patient to arrive at 10:15am for a 10:30 nurse appt

## 2018-01-10 ENCOUNTER — Encounter: Payer: Self-pay | Admitting: Oncology

## 2018-01-10 ENCOUNTER — Inpatient Hospital Stay: Payer: Medicare Other | Attending: Obstetrics | Admitting: Obstetrics

## 2018-01-10 ENCOUNTER — Other Ambulatory Visit (HOSPITAL_COMMUNITY)
Admission: RE | Admit: 2018-01-10 | Discharge: 2018-01-10 | Disposition: A | Discharge status: Home / Self Care | Payer: Medicare Other | Source: Ambulatory Visit | Attending: Obstetrics | Admitting: Obstetrics

## 2018-01-10 ENCOUNTER — Encounter: Payer: Self-pay | Admitting: *Deleted

## 2018-01-10 VITALS — BP 152/68 | HR 82 | Temp 97.5°F | Resp 20 | Ht 66.0 in | Wt 166.2 lb

## 2018-01-10 DIAGNOSIS — D3910 Neoplasm of uncertain behavior of unspecified ovary: Secondary | ICD-10-CM

## 2018-01-10 DIAGNOSIS — C562 Malignant neoplasm of left ovary: Secondary | ICD-10-CM

## 2018-01-10 DIAGNOSIS — C801 Malignant (primary) neoplasm, unspecified: Secondary | ICD-10-CM

## 2018-01-10 DIAGNOSIS — R599 Enlarged lymph nodes, unspecified: Secondary | ICD-10-CM | POA: Diagnosis not present

## 2018-01-10 DIAGNOSIS — N839 Noninflammatory disorder of ovary, fallopian tube and broad ligament, unspecified: Secondary | ICD-10-CM | POA: Diagnosis not present

## 2018-01-10 DIAGNOSIS — N838 Other noninflammatory disorders of ovary, fallopian tube and broad ligament: Secondary | ICD-10-CM

## 2018-01-10 DIAGNOSIS — C786 Secondary malignant neoplasm of retroperitoneum and peritoneum: Secondary | ICD-10-CM | POA: Diagnosis not present

## 2018-01-10 NOTE — Patient Instructions (Signed)
1) We have ordered a biopsy to be performed with interventional radiology at the hospital.  You will receive a call from the hospital to arrange for this appointment.  2) Once we have the biopsy results, we will arrange for you to meet with Dr. [name], Medical Oncologist, to discuss chemotherapy.  3) The recommendation is for three cycles of chemotherapy up front followed by imaging and then an appointment to meet with Dr. Gerarda Fraction for a discussion about potential surgery.  4) Please call for any questions or concerns at [phone].

## 2018-01-10 NOTE — Progress Notes (Signed)
[place] at [facility] Note: New Patient FIRST VISIT  Consult was requested by Dr. Marton Redwood for a palpable mass that on workup was found to be c/w an adnexal mass with associated carcinomatosis and periaortic adenopathy (1.14x2.17cm)  Chief Complaint  Patient presents with  . Ovarian mass    HPI: [name]  is an 84 y.o.  P4  She was seen by her PCP, Dr. Brigitte Pulse, for an annual exam and on palpation of the abdomen a mass was noted. Workup included a CTScan AP 01/02/18 revealing a large complex pelvic mass, carcinomatosis with omental nodularity, and associated left periaortic adenopathy (measured by me at 1.14 x 2.17cm). No chest imaging.  01/03/18 CA125 was obtained and equal to 1492. She was therefore referred for recommendations.  She has noted some fatigue and apathy. No pain. She discusses some discomfort that she first noted 3 years ago in her LLQ but the family states it was elsewhere that she was having pain then. Denies bleeding, vaginal dc. She has had about 3-4 months of early satiety but no weight loss, no reflux. Some slight constipation but no big change in bowel movements. Last colonoscopy was 2-3 years ago.    Imported EPIC Oncologic History: TBD  No history exists.    ECOG PERFORMANCE STATUS: 0 - Asymptomatic  Measurement of disease:  CA125 . 01/04/18 1492 (preop)   Radiology: Ct Abdomen Pelvis W Contrast  Result Date: 01/02/2018 CLINICAL DATA:  Pelvis swelling/lump on physical exam. Slight weight gain. EXAM: CT ABDOMEN AND PELVIS WITH CONTRAST TECHNIQUE: Multidetector CT imaging of the abdomen and pelvis was performed using the standard protocol following bolus administration of intravenous contrast. CONTRAST:  119mL ISOVUE-300 IOPAMIDOL (ISOVUE-300) INJECTION 61% COMPARISON:  02/19/2013. FINDINGS: Lower chest: 6 mm subpleural right lower lobe nodule is unchanged and considered benign. Mild scarring in both  lower lobes. Heart is at the upper limits of normal in size. No pericardial or pleural effusion. Hepatobiliary: Liver and gallbladder are unremarkable. No biliary ductal dilatation. Pancreas: Negative. Spleen: Negative. Adrenals/Urinary Tract: Adrenal glands are unremarkable. Subcentimeter low-attenuation lesions in the kidneys are too small to characterize. Ureters are decompressed. Bladder is low in volume and largely obscured by streak artifact from a right hip arthroplasty. Stomach/Bowel: Stomach, small bowel and colon are unremarkable. Appendix is not readily visualized. Vascular/Lymphatic: Atherosclerotic calcification of the arterial vasculature without abdominal aortic aneurysm. Left periaortic lymph node measures 11 mm (image 28). Reproductive: Low pelvic structures are somewhat obscured by streak artifact from a right hip arthroplasty. There is a large heterogeneous mass in the left paramidline pelvis, measuring 14.2 x 8.1 x 11.4 cm, with associated cystic components and/or fluid. Left ovary is not visualized as a separate structure. Uterus is seen to the right of the chest described mass (series 2, image 65). Other: Small pelvic free fluid. Omental haziness and nodularity are seen with an index omental nodule anterior to the gallbladder measuring 1.5 cm. Musculoskeletal: Right hip arthroplasty. Degenerative changes in the spine. No worrisome lytic or sclerotic lesions. IMPRESSION: 1. Large complex left paramidline pelvic mass likely rises from the left ovary. Together with left periaortic adenopathy and peritoneal carcinomatosis, findings are worrisome for metastatic ovarian cancer. 2. Small pelvic free fluid. 3.  Aortic atherosclerosis (ICD10-170.0). Electronically Signed   By: Lorin Picket M.D.   On: 01/02/2018 14:48   Ct Biopsy  Result Date: 01/21/2018 INDICATION: No known primary, now with pelvic mass and omental nodules  worrisome for metastatic disease. Please perform CT-guided biopsy for  tissue diagnostic purposes. EXAM: CT-GUIDED BIOPSY OF OMENTAL NODULE COMPARISON:  CT abdomen pelvis - 01/02/2018 MEDICATIONS: None. ANESTHESIA/SEDATION: Fentanyl 50 mcg IV; Versed 1 mg IV Sedation time: 10 minutes; The patient was continuously monitored during the procedure by the interventional radiology nurse under my direct supervision. CONTRAST:  None. COMPLICATIONS: None immediate. PROCEDURE: Informed consent was obtained from the patient following an explanation of the procedure, risks, benefits and alternatives. A time out was performed prior to the initiation of the procedure. The patient was positioned supine on the CT table and a limited CT was performed for procedural planning demonstrating unchanged appearance of the dominant approximately 2.2 x 1.3 cm omental nodule within the right lower abdomen adjacent to the cecum (image 63, series 2). The procedure was planned. The operative site was prepped and draped in the usual sterile fashion. Appropriate trajectory was confirmed with a 22 gauge spinal needle after the adjacent tissues were anesthetized with 1% Lidocaine with epinephrine. Under intermittent CT guidance, a 17 gauge coaxial needle was advanced into the peripheral aspect of the mass. Appropriate positioning was confirmed and 5 core needle biopsy samples were obtained with an 18 gauge core needle biopsy device. The co-axial needle was removed and hemostasis was achieved with manual compression. A limited postprocedural CT was negative for hemorrhage or additional complication. A dressing was placed. The patient tolerated the procedure well without immediate postprocedural complication. IMPRESSION: Technically successful CT guided core needle biopsy of dominant omental nodule within the right lower abdominal quadrant, adjacent to the cecum. Electronically Signed   By: Sandi Mariscal M.D.   On: 01/21/2018 11:22   .   Outpatient Encounter Medications as of 01/10/2018  Medication Sig  . amLODipine  (NORVASC) 2.5 MG tablet Take 5 mg by mouth daily.   . hydrochlorothiazide (HYDRODIURIL) 25 MG tablet Take 25 mg by mouth daily.  Marland Kitchen ibuprofen (ADVIL,MOTRIN) 200 MG tablet Take 200 mg by mouth daily as needed. For pain   . levothyroxine (SYNTHROID, LEVOTHROID) 88 MCG tablet Take 88 mcg by mouth daily before breakfast.  . loratadine (CLARITIN) 10 MG tablet Take 10 mg by mouth daily.   . meclizine (ANTIVERT) 25 MG tablet Take 1 tablet (25 mg total) by mouth 2 (two) times daily as needed for dizziness.  . metoprolol succinate (TOPROL-XL) 50 MG 24 hr tablet Take 1 tablet by mouth daily.  . Multiple Vitamins-Minerals (MULTIVITAMINS THER. W/MINERALS) TABS Take 1 tablet by mouth daily.    Marland Kitchen olmesartan (BENICAR) 40 MG tablet Take 40 mg by mouth daily.    No facility-administered encounter medications on file as of 01/10/2018.    No Known Allergies  Past Medical History:  Diagnosis Date  . Allergy   . Anemia   . Blood transfusion without reported diagnosis   . Hypertension   . Hypothyroidism   . Osteoporosis 10/07   Dexa scan   Past Surgical History:  Procedure Laterality Date  . APPENDECTOMY  1950   not ruptured  . THYROIDECTOMY    . TONSILLECTOMY    . TOTAL HIP ARTHROPLASTY Right       Past Gynecological History:   GYNECOLOGIC HISTORY:  No LMP recorded. Patient is postmenopausal. . Menarche: 84 years old . P 4 . LMP 84 yo . Contraceptive 2 years OCP . HRT None  . Last Pap several years ago Family Hx:  Family History  Problem Relation Age of Onset  . Diabetes Mother   .  Stroke Mother   . Breast cancer Sister   . Diabetes Sister   . Lung cancer Brother   . Stroke Brother    Social Hx:  Marland Kitchen Tobacco use: none . Alcohol use: none . Illicit Drug use: none . Illicit IV Drug use: none    Review of Systems: Review of Systems  Constitutional: Positive for fatigue.  All other systems reviewed and are negative.  Vitals: Blood pressure (!) 152/68, pulse 82, temperature (!) 97.5 F  (36.4 C), temperature source Oral, resp. rate 20, height 5\' 6"  (1.676 m), weight 166 lb 3.2 oz (75.4 kg), SpO2 97 %. Vitals:   01/10/18 1040  Weight: 166 lb 3.2 oz (75.4 kg)  Height: 5\' 6"  (1.676 m)    Body mass index is 26.83 kg/m.   Physical Exam: General :  Well developed, 84 y.o., female in no apparent distress HEENT:  Normocephalic/atraumatic, symmetric, EOMI, eyelids normal Neck:   Supple, no masses.  Lymphatics:  No cervical/ submandibular/ supraclavicular/ infraclavicular/ inguinal adenopathy Respiratory:  Respirations unlabored, no use of accessory muscles CV:   Deferred Breast:  Deferred Musculoskeletal: No CVA tenderness, normal muscle strength. Abdomen:  Soft, non-tender and nondistended. No evidence of hernia. Palpable mass. Extremities:  No lymphedema, no erythema, non-tender. Skin:   Normal inspection Neuro/Psych:  No focal motor deficit, no abnormal mental status. Normal gait. Normal affect. Alert and oriented to person, place, and time  Genito Urinary: Vulva: Normal external female genitalia.  Bladder/urethra: Urethral meatus normal in size and location. No lesions or   masses, well supported bladder Speculum exam: Vagina: atrophic. No lesion, no discharge, no bleeding. Cervix: Normal appearing, no lesions. Bimanual exam: palpable disease in culdesac, nodular, no breakthrough to vagina.  Uterus: Normal size, mobile.  Adnexa: palpable disease in culdesac deviating to patient's left.  Rectovaginal:  + disease palpable but no breakthrough into rectal mucsosa. Cannot separate from anterior rectal wall. Starting about 6-7cm from anal verge. Good tone, no parametrial involvement   Assessment  Pelvic mass with paraaortic adenopathy and omental disease  Plan  1. Data reviewed o I independently reviewed the images and discussed my interpretation in the presence of the patient and her family today. o I reviewed her referring doctor's office notes o Some history was  obtained from family members as the patient had a hard time recalling some of her symptoms o We reviewed the abnormal tumor marker 2. Overall concern is for ovarian cancer o We discussed various options from doing nothing, which would likely shorten her life to the next few months o Discussed standard of care chemotherapy with surgery and the timing of these treatments 3. Treatment planning o We specifically reviewed primary surgical management and the risks that in her case could include inability to resect all disease (left paraaortic lymph node near left renal vein) and possible colostomy / bowel reanastomosis with potential for leak. o I am inclined to favor neoadjuvant chemotherapy for the reasons above i. We discussed the benefits of this approach including ability to observe response to treatment, less morbid surgical procedure down the road ii. We discussed some of the adverse effects of chemotherapy that may be noticeable for the patient (hair loss, fatigue, joint pain) o We will schedule a CT guided core biopsy of the omental nodule and refer to oncology pending those results o After neoadjuvant chemotherapy the plan would be for surgery and then she understands additional chemotherapy. 4. Pending items o Chest imaging needs to be obtained at some point, +/-  PET scan and I will go  o CEA will be drawn o Referral to oncology pending biopsy 5. Return pending above but preferably around Cycle 2 of chemo to see how things are progressing  Face to face time with patient was >60 minutes. Over 50% of this time was spent on counseling and coordination of care.  [name], MD  01/23/2018, 5:16 PM   Cc: Marton Redwood, MD (Referring PCP)

## 2018-01-11 ENCOUNTER — Other Ambulatory Visit: Payer: Self-pay | Admitting: Gynecologic Oncology

## 2018-01-11 DIAGNOSIS — N838 Other noninflammatory disorders of ovary, fallopian tube and broad ligament: Secondary | ICD-10-CM

## 2018-01-15 ENCOUNTER — Telehealth: Payer: Self-pay | Admitting: Oncology

## 2018-01-15 ENCOUNTER — Telehealth: Payer: Self-pay

## 2018-01-15 DIAGNOSIS — N838 Other noninflammatory disorders of ovary, fallopian tube and broad ligament: Secondary | ICD-10-CM

## 2018-01-15 LAB — CYTOLOGY - PAP: DIAGNOSIS: NEGATIVE

## 2018-01-15 NOTE — Telephone Encounter (Signed)
Called patient back and advised her of the appointment with Dr. Alvy Bimler on 01/24/18 at 2 pm.  She verbalized understanding and agreement.

## 2018-01-15 NOTE — Telephone Encounter (Signed)
Attempted to call patient with appointment time for Dr. Alvy Bimler on 01/24/18.  There was no answer.  Will try again.

## 2018-01-15 NOTE — Telephone Encounter (Signed)
Pt notified about pap results: negative.  No questions or concerns voiced. 

## 2018-01-17 ENCOUNTER — Ambulatory Visit (HOSPITAL_COMMUNITY): Payer: Medicare Other

## 2018-01-18 ENCOUNTER — Other Ambulatory Visit: Payer: Self-pay | Admitting: Radiology

## 2018-01-21 ENCOUNTER — Ambulatory Visit (HOSPITAL_COMMUNITY)
Admission: RE | Admit: 2018-01-21 | Discharge: 2018-01-21 | Disposition: A | Discharge status: Home / Self Care | Payer: Medicare Other | Source: Ambulatory Visit | Attending: Gynecologic Oncology | Admitting: Gynecologic Oncology

## 2018-01-21 ENCOUNTER — Other Ambulatory Visit: Payer: Self-pay

## 2018-01-21 ENCOUNTER — Encounter (HOSPITAL_COMMUNITY): Payer: Self-pay

## 2018-01-21 DIAGNOSIS — M810 Age-related osteoporosis without current pathological fracture: Secondary | ICD-10-CM | POA: Diagnosis not present

## 2018-01-21 DIAGNOSIS — E039 Hypothyroidism, unspecified: Secondary | ICD-10-CM | POA: Diagnosis not present

## 2018-01-21 DIAGNOSIS — Z79890 Hormone replacement therapy (postmenopausal): Secondary | ICD-10-CM | POA: Diagnosis not present

## 2018-01-21 DIAGNOSIS — Z79899 Other long term (current) drug therapy: Secondary | ICD-10-CM | POA: Insufficient documentation

## 2018-01-21 DIAGNOSIS — Z7951 Long term (current) use of inhaled steroids: Secondary | ICD-10-CM | POA: Insufficient documentation

## 2018-01-21 DIAGNOSIS — C481 Malignant neoplasm of specified parts of peritoneum: Secondary | ICD-10-CM | POA: Insufficient documentation

## 2018-01-21 DIAGNOSIS — N839 Noninflammatory disorder of ovary, fallopian tube and broad ligament, unspecified: Secondary | ICD-10-CM | POA: Diagnosis present

## 2018-01-21 DIAGNOSIS — I10 Essential (primary) hypertension: Secondary | ICD-10-CM | POA: Diagnosis not present

## 2018-01-21 DIAGNOSIS — N838 Other noninflammatory disorders of ovary, fallopian tube and broad ligament: Secondary | ICD-10-CM

## 2018-01-21 DIAGNOSIS — R1903 Right lower quadrant abdominal swelling, mass and lump: Secondary | ICD-10-CM | POA: Diagnosis not present

## 2018-01-21 LAB — CBC WITH DIFFERENTIAL/PLATELET
Basophils Absolute: 0.1 10*3/uL (ref 0.0–0.1)
Basophils Relative: 1 %
Eosinophils Absolute: 0.2 10*3/uL (ref 0.0–0.7)
Eosinophils Relative: 3 %
HEMATOCRIT: 41.8 % (ref 36.0–46.0)
HEMOGLOBIN: 13.3 g/dL (ref 12.0–15.0)
LYMPHS PCT: 19 %
Lymphs Abs: 1.5 10*3/uL (ref 0.7–4.0)
MCH: 28.4 pg (ref 26.0–34.0)
MCHC: 31.8 g/dL (ref 30.0–36.0)
MCV: 89.3 fL (ref 78.0–100.0)
MONOS PCT: 7 %
Monocytes Absolute: 0.6 10*3/uL (ref 0.1–1.0)
NEUTROS ABS: 5.8 10*3/uL (ref 1.7–7.7)
NEUTROS PCT: 70 %
Platelets: 402 10*3/uL — ABNORMAL HIGH (ref 150–400)
RBC: 4.68 MIL/uL (ref 3.87–5.11)
RDW: 14.8 % (ref 11.5–15.5)
WBC: 8.1 10*3/uL (ref 4.0–10.5)

## 2018-01-21 LAB — COMPREHENSIVE METABOLIC PANEL
ALT: 20 U/L (ref 0–44)
AST: 36 U/L (ref 15–41)
Albumin: 4.2 g/dL (ref 3.5–5.0)
Alkaline Phosphatase: 302 U/L — ABNORMAL HIGH (ref 38–126)
Anion gap: 8 (ref 5–15)
BUN: 13 mg/dL (ref 8–23)
CHLORIDE: 104 mmol/L (ref 98–111)
CO2: 26 mmol/L (ref 22–32)
Calcium: 9.3 mg/dL (ref 8.9–10.3)
Creatinine, Ser: 0.68 mg/dL (ref 0.44–1.00)
GFR calc non Af Amer: >60 mL/min (ref >60)
Glucose, Bld: 112 mg/dL — ABNORMAL HIGH (ref 70–99)
POTASSIUM: 4.3 mmol/L (ref 3.5–5.1)
SODIUM: 138 mmol/L (ref 135–145)
Total Bilirubin: 0.6 mg/dL (ref 0.3–1.2)
Total Protein: 8.2 g/dL — ABNORMAL HIGH (ref 6.5–8.1)

## 2018-01-21 LAB — PROTIME-INR
INR: 1.01
Prothrombin Time: 13.2 seconds (ref 11.4–15.2)

## 2018-01-21 MED ORDER — LIDOCAINE HCL (PF) 1 % IJ SOLN
INTRAMUSCULAR | Status: AC | PRN
  Administered 2018-01-21: 10 mL

## 2018-01-21 MED ORDER — SODIUM CHLORIDE 0.9 % IV SOLN
INTRAVENOUS | Status: DC
  Administered 2018-01-21: 09:00:00 via INTRAVENOUS

## 2018-01-21 MED ORDER — MIDAZOLAM HCL 2 MG/2ML IJ SOLN
INTRAMUSCULAR | Status: AC
  Filled 2018-01-21: qty 2

## 2018-01-21 MED ORDER — FENTANYL CITRATE (PF) 100 MCG/2ML IJ SOLN
INTRAMUSCULAR | Status: AC | PRN
  Administered 2018-01-21 (×2): 25 ug via INTRAVENOUS

## 2018-01-21 MED ORDER — FENTANYL CITRATE (PF) 100 MCG/2ML IJ SOLN
INTRAMUSCULAR | Status: AC
  Filled 2018-01-21: qty 2

## 2018-01-21 MED ORDER — MIDAZOLAM HCL 2 MG/2ML IJ SOLN
INTRAMUSCULAR | Status: AC | PRN
  Administered 2018-01-21 (×2): 0.5 mg via INTRAVENOUS

## 2018-01-21 NOTE — Progress Notes (Signed)
FMLA for daughter, Rayna Brenner, successfully faxed to her at [phone]. Also faxed a copy to [place] at [phone].

## 2018-01-21 NOTE — Consult Note (Signed)
Chief Complaint: Patient was seen in consultation today for image guided biopsy of pelvic mass versus peritoneal/omental nodule.  Referring Physician(s): Cross,Melissa D/Phelps,S  Supervising Physician: Sandi Mariscal  Patient Status: [facility] - Out-pt  History of Present Illness: Shannon Ellis is a 84 y.o. female with history of hypertension, hypothyroidism, osteoporosis and recent physical exam with finding of pelvic mass.  Subsequent CT scan on 01/02/2018 revealed a large complex left paramidline pelvic mass likely arising from the left ovary.  Also noted was left periaortic adenopathy and peritoneal carcinomatosis concerning for metastatic ovarian cancer.  She presents today for image guided biopsy of the pelvic mass versus omental nodule for further evaluation.  Past Medical History:  Diagnosis Date  . Allergy   . Anemia   . Blood transfusion without reported diagnosis   . Hypertension   . Hypothyroidism   . Osteoporosis 10/07   Dexa scan    Past Surgical History:  Procedure Laterality Date  . APPENDECTOMY    . THYROIDECTOMY    . TONSILLECTOMY    . TOTAL HIP ARTHROPLASTY Right     Allergies: Patient has no known allergies.  Medications: Prior to Admission medications   Medication Sig Start Date End Date Taking? Authorizing Provider  amLODipine (NORVASC) 2.5 MG tablet Take 5 mg by mouth daily.  03/07/13  Yes [provider]  ibuprofen (ADVIL,MOTRIN) 200 MG tablet Take 200 mg by mouth daily as needed. For pain    Yes [provider]  levothyroxine (SYNTHROID, LEVOTHROID) 88 MCG tablet Take 88 mcg by mouth daily before breakfast.   Yes [provider]  loratadine (CLARITIN) 10 MG tablet Take 10 mg by mouth daily.    Yes [provider]  metoprolol succinate (TOPROL-XL) 50 MG 24 hr tablet Take 1 tablet by mouth daily. 10/15/17  Yes [provider]  olmesartan (BENICAR) 40 MG tablet Take 40 mg by mouth daily.    Yes [provider]  hydrochlorothiazide (HYDRODIURIL) 25 MG tablet Take 25 mg by mouth daily.    [provider]  meclizine (ANTIVERT) 25 MG tablet Take 1 tablet (25 mg total) by mouth 2 (two) times daily as needed for dizziness. 06/07/13   Blanchie Dessert, MD  Multiple Vitamins-Minerals (MULTIVITAMINS THER. W/MINERALS) TABS Take 1 tablet by mouth daily.      [provider]     Family History  Problem Relation Age of Onset  . Diabetes Mother   . Stroke Mother   . Breast cancer Sister   . Diabetes Sister   . Lung cancer Brother   . Stroke Brother     Social History   Socioeconomic History  . Marital status: Single    Spouse name: Not on file  . Number of children: 4  . Years of education: Not on file  . Highest education level: Not on file  Occupational History  . Occupation: retired  Scientific laboratory technician  . Financial resource strain: Not on file  . Food insecurity:    Worry: Not on file    Inability: Not on file  . Transportation needs:    Medical: Not on file    Non-medical: Not on file  Tobacco Use  . Smoking status: Never Smoker  . Smokeless tobacco: Never Used  Substance and Sexual Activity  . Alcohol use: No    Alcohol/week: 0.0 oz  . Drug use: No  . Sexual activity: Not on file  Lifestyle  . Physical activity:    Days per week: Not  on file    Minutes per session: Not on file  . Stress: Not on file  Relationships  . Social connections:    Talks on phone: Not on file    Gets together: Not on file    Attends religious service: Not on file    Active member of club or organization: Not on file    Attends meetings of clubs or organizations: Not on file    Relationship status: Not on file  Other Topics Concern  . Not on file  Social History Narrative  . Not on file     Review of Systems currently denies fever, headache, chest pain, dyspnea, cough, abdominal/back pain, nausea, vomiting or bleeding.  She is hard of hearing.  Vital Signs: Ht 5\' 6"   (1.676 m)   Wt 166 lb (75.3 kg)   BMI 26.79 kg/m   Physical Exam awake, alert.  Chest clear to auscultation bilaterally.  Heart with regular rate and rhythm.  Abdomen soft, positive bowel sounds, left pelvic fullness to palpation but currently nontender.  No lower extremity edema.  Imaging: Ct Abdomen Pelvis W Contrast  Result Date: 01/02/2018 CLINICAL DATA:  Pelvis swelling/lump on physical exam. Slight weight gain. EXAM: CT ABDOMEN AND PELVIS WITH CONTRAST TECHNIQUE: Multidetector CT imaging of the abdomen and pelvis was performed using the standard protocol following bolus administration of intravenous contrast. CONTRAST:  140mL ISOVUE-300 IOPAMIDOL (ISOVUE-300) INJECTION 61% COMPARISON:  02/19/2013. FINDINGS: Lower chest: 6 mm subpleural right lower lobe nodule is unchanged and considered benign. Mild scarring in both lower lobes. Heart is at the upper limits of normal in size. No pericardial or pleural effusion. Hepatobiliary: Liver and gallbladder are unremarkable. No biliary ductal dilatation. Pancreas: Negative. Spleen: Negative. Adrenals/Urinary Tract: Adrenal glands are unremarkable. Subcentimeter low-attenuation lesions in the kidneys are too small to characterize. Ureters are decompressed. Bladder is low in volume and largely obscured by streak artifact from a right hip arthroplasty. Stomach/Bowel: Stomach, small bowel and colon are unremarkable. Appendix is not readily visualized. Vascular/Lymphatic: Atherosclerotic calcification of the arterial vasculature without abdominal aortic aneurysm. Left periaortic lymph node measures 11 mm (image 28). Reproductive: Low pelvic structures are somewhat obscured by streak artifact from a right hip arthroplasty. There is a large heterogeneous mass in the left paramidline pelvis, measuring 14.2 x 8.1 x 11.4 cm, with associated cystic components and/or fluid. Left ovary is not visualized as a separate structure. Uterus is seen to the right of the chest  described mass (series 2, image 65). Other: Small pelvic free fluid. Omental haziness and nodularity are seen with an index omental nodule anterior to the gallbladder measuring 1.5 cm. Musculoskeletal: Right hip arthroplasty. Degenerative changes in the spine. No worrisome lytic or sclerotic lesions. IMPRESSION: 1. Large complex left paramidline pelvic mass likely rises from the left ovary. Together with left periaortic adenopathy and peritoneal carcinomatosis, findings are worrisome for metastatic ovarian cancer. 2. Small pelvic free fluid. 3.  Aortic atherosclerosis (ICD10-170.0). Electronically Signed   By: Lorin Picket M.D.   On: 01/02/2018 14:48    Labs:  CBC: Recent Labs    01/21/18 0900  WBC 8.1  HGB 13.3  HCT 41.8  PLT 402*    COAGS: Recent Labs    01/21/18 0900  INR 1.01    BMP: Recent Labs    01/21/18 0900  NA 138  K 4.3  CL 104  CO2 26  GLUCOSE 112*  BUN 13  CALCIUM 9.3  CREATININE 0.68  GFRNONAA >60  GFRAA >60  LIVER FUNCTION TESTS: Recent Labs    01/21/18 0900  BILITOT 0.6  AST 36  ALT 20  ALKPHOS 302*  PROT 8.2*  ALBUMIN 4.2    TUMOR MARKERS: No results for input(s): AFPTM, CEA, CA199, CHROMGRNA in the last 8760 hours.  Assessment and Plan: 84 y.o. female with history of hypertension, hypothyroidism, osteoporosis and recent physical exam with finding of pelvic mass.  Subsequent CT scan on 01/02/2018 revealed a large complex left paramidline pelvic mass likely arising from the left ovary.  Also noted was left periaortic adenopathy and peritoneal carcinomatosis concerning for metastatic ovarian cancer.  She presents today for image guided biopsy of the pelvic mass versus omental nodule for further evaluation.Risks and benefits discussed with the patient/family including, but not limited to bleeding, infection, damage to adjacent structures or low yield requiring additional tests.  All of the patient's questions were answered, patient is  agreeable to proceed. Consent signed and in chart.     Thank you for this interesting consult.  I greatly enjoyed meeting Shannon Ellis and look forward to participating in their care.  A copy of this report was sent to the requesting provider on this date.  Electronically Signed: D. Rowe Robert, PA-C 01/21/2018, 9:56 AM   I spent a total of 25 minutes    in face to face in clinical consultation, greater than 50% of which was counseling/coordinating care for image guided biopsy of pelvic mass versus omental nodule.

## 2018-01-21 NOTE — Procedures (Signed)
Pre procedural Dx: Omental nodule  Post procedural Dx: Same  Technically successful CT guided biopsy of dominant omental nodule within the right lower abdominal quadrant   EBL: None.   Complications: None immediate.   Ronny Bacon, MD Pager #: [phone]

## 2018-01-21 NOTE — Discharge Instructions (Signed)
Moderate Conscious Sedation, Adult, Care After °These instructions provide you with information about caring for yourself after your procedure. Your health care provider may also give you more specific instructions. Your treatment has been planned according to current medical practices, but problems sometimes occur. Call your health care provider if you have any problems or questions after your procedure. °What can I expect after the procedure? °After your procedure, it is common: °· To feel sleepy for several hours. °· To feel clumsy and have poor balance for several hours. °· To have poor judgment for several hours. °· To vomit if you eat too soon. ° °Follow these instructions at home: °For at least 24 hours after the procedure: ° °· Do not: °? Participate in activities where you could fall or become injured. °? Drive. °? Use heavy machinery. °? Drink alcohol. °? Take sleeping pills or medicines that cause drowsiness. °? Make important decisions or sign legal documents. °? Take care of children on your own. °· Rest. °Eating and drinking °· Follow the diet recommended by your health care provider. °· If you vomit: °? Drink water, juice, or soup when you can drink without vomiting. °? Make sure you have little or no nausea before eating solid foods. °General instructions °· Have a responsible adult stay with you until you are awake and alert. °· Take over-the-counter and prescription medicines only as told by your health care provider. °· If you smoke, do not smoke without supervision. °· Keep all follow-up visits as told by your health care provider. This is important. °Contact a health care provider if: °· You keep feeling nauseous or you keep vomiting. °· You feel light-headed. °· You develop a rash. °· You have a fever. °Get help right away if: °· You have trouble breathing. °This information is not intended to replace advice given to you by your health care provider. Make sure you discuss any questions you have  with your health care provider. °Document Released: 03/26/2013 Document Revised: 11/08/2015 Document Reviewed: 09/25/2015 °Elsevier Interactive Patient Education © 2018 [employer] ° ° °Needle Biopsy, Care After °These instructions give you information about caring for yourself after your procedure. Your doctor may also give you more specific instructions. Call your doctor if you have any problems or questions after your procedure. °Follow these instructions at home: °· Rest as told by your doctor. °· Take medicines only as told by your doctor. °· There are many different ways to close and cover the biopsy site, including stitches (sutures), skin glue, and adhesive strips. Follow instructions from your doctor about: °? How to take care of your biopsy site. °? When and how you should change your bandage (dressing). °? When you should remove your dressing. °? Removing whatever was used to close your biopsy site. °· Check your biopsy site every day for signs of infection. Watch for: °? Redness, swelling, or pain. °? Fluid, blood, or pus. °Contact a doctor if: °· You have a fever. °· You have redness, swelling, or pain at the biopsy site, and it lasts longer than a few days. °· You have fluid, blood, or pus coming from the biopsy site. °· You feel sick to your stomach (nauseous). °· You throw up (vomit). °Get help right away if: °· You are short of breath. °· You have trouble breathing. °· Your chest hurts. °· You feel dizzy or you pass out (faint). °· You have bleeding that does not stop with pressure or a bandage. °· You cough up blood. °·   Your belly (abdomen) hurts. °This information is not intended to replace advice given to you by your health care provider. Make sure you discuss any questions you have with your health care provider. °Document Released: 05/18/2008 Document Revised: 11/11/2015 Document Reviewed: 06/01/2014 °Elsevier Interactive Patient Education © 2018 [employer] ° °

## 2018-01-23 ENCOUNTER — Encounter: Payer: Self-pay | Admitting: Obstetrics

## 2018-01-23 DIAGNOSIS — C562 Malignant neoplasm of left ovary: Secondary | ICD-10-CM | POA: Insufficient documentation

## 2018-01-23 DIAGNOSIS — C569 Malignant neoplasm of unspecified ovary: Secondary | ICD-10-CM

## 2018-01-24 ENCOUNTER — Inpatient Hospital Stay: Payer: Medicare Other

## 2018-01-24 ENCOUNTER — Encounter: Payer: Self-pay | Admitting: Hematology and Oncology

## 2018-01-24 ENCOUNTER — Inpatient Hospital Stay: Payer: Medicare Other | Attending: Obstetrics | Admitting: Hematology and Oncology

## 2018-01-24 ENCOUNTER — Telehealth: Payer: Self-pay

## 2018-01-24 DIAGNOSIS — C786 Secondary malignant neoplasm of retroperitoneum and peritoneum: Secondary | ICD-10-CM | POA: Diagnosis not present

## 2018-01-24 DIAGNOSIS — R971 Elevated cancer antigen 125 [CA 125]: Secondary | ICD-10-CM | POA: Diagnosis not present

## 2018-01-24 DIAGNOSIS — Z79899 Other long term (current) drug therapy: Secondary | ICD-10-CM | POA: Insufficient documentation

## 2018-01-24 DIAGNOSIS — Z5111 Encounter for antineoplastic chemotherapy: Secondary | ICD-10-CM | POA: Insufficient documentation

## 2018-01-24 DIAGNOSIS — C562 Malignant neoplasm of left ovary: Secondary | ICD-10-CM | POA: Diagnosis not present

## 2018-01-24 NOTE — Telephone Encounter (Signed)
Daughter called and left a message to call her. They forgot to go to the lab after appt with Dr. Alvy Bimler today.  Called back and appt given for lab 8/9 at 2 pm prior to Dr. Gerarda Fraction appt. Daughter verbalized understanding.Scheduling message sent.

## 2018-01-25 ENCOUNTER — Inpatient Hospital Stay (HOSPITAL_BASED_OUTPATIENT_CLINIC_OR_DEPARTMENT_OTHER): Payer: Medicare Other | Admitting: Obstetrics

## 2018-01-25 ENCOUNTER — Other Ambulatory Visit: Payer: Self-pay | Admitting: Hematology and Oncology

## 2018-01-25 ENCOUNTER — Encounter: Payer: Self-pay | Admitting: Hematology and Oncology

## 2018-01-25 ENCOUNTER — Encounter: Payer: Self-pay | Admitting: Obstetrics

## 2018-01-25 ENCOUNTER — Inpatient Hospital Stay: Payer: Medicare Other

## 2018-01-25 VITALS — BP 158/82 | HR 66 | Temp 97.8°F | Resp 20 | Ht 66.0 in | Wt 166.7 lb

## 2018-01-25 DIAGNOSIS — C786 Secondary malignant neoplasm of retroperitoneum and peritoneum: Secondary | ICD-10-CM

## 2018-01-25 DIAGNOSIS — Z79899 Other long term (current) drug therapy: Secondary | ICD-10-CM | POA: Diagnosis not present

## 2018-01-25 DIAGNOSIS — R971 Elevated cancer antigen 125 [CA 125]: Secondary | ICD-10-CM | POA: Diagnosis not present

## 2018-01-25 DIAGNOSIS — C562 Malignant neoplasm of left ovary: Secondary | ICD-10-CM

## 2018-01-25 DIAGNOSIS — Z5111 Encounter for antineoplastic chemotherapy: Secondary | ICD-10-CM | POA: Diagnosis not present

## 2018-01-25 DIAGNOSIS — N838 Other noninflammatory disorders of ovary, fallopian tube and broad ligament: Secondary | ICD-10-CM

## 2018-01-25 DIAGNOSIS — R1900 Intra-abdominal and pelvic swelling, mass and lump, unspecified site: Secondary | ICD-10-CM | POA: Insufficient documentation

## 2018-01-25 LAB — CEA (IN HOUSE-CHCC): CEA (CHCC-In House): 3.37 ng/mL (ref 0.00–5.00)

## 2018-01-25 NOTE — Progress Notes (Signed)
[place] at [facility]   Progress Note: Established Patient FIRST VISIT  Consult was originally requested by Dr. Marton Redwood for a palpable mass that on workup was found to be c/w an adnexal mass with associated carcinomatosis and periaortic adenopathy (1.14x2.17cm)  Chief Complaint  Patient presents with  . Malignant neoplasm of left ovary [facility])    HPI: [name]  is an 84 y.o.  P4   Interval History Since her last visit we arranged a biopsy of the omentum. That returned 01/21/2018 as poorly differentiated carcinoma with stains +PAX8, WT1, ER/PR, CK7, and +AE1/AE3, PLAP. Negative for multiple other markers with a read the morphology and immunophenotype are suggestive of a germ cell tumor.  Given the unusual histology Dr. Alvy Bimler is concerned with upfront neoadjuvant chemotherapy. She is seeing me back to discuss options.  Recall  CA125 is 1492 and she has carcinomatosis/omental cake and a 2.1x1.1 cm paraaortic node.   Presenting History She was seen by her PCP, Dr. Brigitte Pulse, for an annual exam and on palpation of the abdomen a mass was noted. Workup included a CTScan AP 01/02/18 revealing a large complex pelvic mass, carcinomatosis with omental nodularity, and associated left periaortic adenopathy (measured by me at 1.14 x 2.17cm). No chest imaging.  01/03/18 CA125 was obtained and equal to 1492. She was therefore referred for recommendations.  She has noted some fatigue and apathy. No pain. She discusses some discomfort that she first noted 3 years ago in her LLQ but the family states it was elsewhere that she was having pain then. Denies bleeding, vaginal dc. She has had about 3-4 months of early satiety but no weight loss, no reflux. Some slight constipation but no big change in bowel movements. Last colonoscopy was 2-3 years ago.    Imported EPIC Oncologic History: TBD   Ovarian cancer ([place])   01/02/2018 Imaging    1. Large complex  left paramidline pelvic mass likely rises from the left ovary. Together with left periaortic adenopathy and peritoneal carcinomatosis, findings are worrisome for metastatic ovarian cancer. 2. Small pelvic free fluid. 3.  Aortic atherosclerosis (ICD10-170.0).    01/21/2018 Pathology Results    Omentum, biopsy, dominant omental nodule within the right lower abdominal quadrant - POORLY DIFFERENTIATED CARCINOMA CONSISTENT WITH GYNECOLOGIC PRIMARY. - SEE MICROSCOPIC DESCRIPTION. Microscopic Comment The tumor is characterized by sheets of tumor cells with enlarged nuclei, many containing prominent nucleoli. There are patchy areas with eosinophilic cytoplasmic inclusions. Immunohistochemistry shows positivity with cytokeratin AE1/AE3, cytokeratin 7, estrogen receptor, progesterone receptor, PAX-8, WT-1, and placental alkaline phosphatase (PLAP). The tumor is negative with alpha fetoprotein, human chorionic gonadotropin, CD117, CD10, CD30, CD56, CDX-2, CEA, cytokeratin 20, S100, SOX-10, synaptophysin, chromogranin, D2-40, GATA-3, GCDFP, Glypican 3 and Inhibin. 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 The estrogen receptor is 100% with strong staining and the progesterone receptor is 90% with strong staining.    01/21/2018 Procedure    Technically successful CT guided core needle biopsy of dominant omental nodule within the right lower abdominal quadrant, adjacent to the cecum.    01/24/2018 Cancer Staging    Staging form: Ovary, Fallopian Tube, and Primary Peritoneal Carcinoma, AJCC 8th Edition - Clinical: Stage IIIC (cT3c, cN1b, cM0) - Signed by [name], MD on 01/24/2018     ECOG PERFORMANCE STATUS: 0 - Asymptomatic  Measurement of disease:  CA125 . 01/04/18 1492 (preop)   Radiology: Ct Abdomen Pelvis W Contrast  Result Date: 01/02/2018 CLINICAL DATA:  Pelvis swelling/lump on physical exam. Slight weight gain. EXAM: CT ABDOMEN AND PELVIS  WITH CONTRAST TECHNIQUE: Multidetector CT imaging of the abdomen and pelvis was performed using the standard protocol following bolus administration of intravenous contrast. CONTRAST:  153m ISOVUE-300 IOPAMIDOL (ISOVUE-300) INJECTION 61% COMPARISON:  02/19/2013. FINDINGS: Lower chest: 6 mm subpleural right lower lobe nodule is unchanged and considered benign. Mild scarring in both lower lobes. Heart is at the upper limits of normal in size. No pericardial or pleural effusion. Hepatobiliary: Liver and gallbladder are unremarkable. No biliary ductal dilatation. Pancreas: Negative. Spleen: Negative. Adrenals/Urinary Tract: Adrenal glands are unremarkable. Subcentimeter low-attenuation lesions in the kidneys are too small to characterize. Ureters are decompressed. Bladder is low in volume and largely obscured by streak artifact from a right hip arthroplasty. Stomach/Bowel: Stomach, small bowel and colon are unremarkable. Appendix is not readily visualized. Vascular/Lymphatic: Atherosclerotic calcification of the arterial vasculature without abdominal aortic aneurysm. Left periaortic lymph node measures 11 mm (image 28). Reproductive: Low pelvic structures are somewhat obscured by streak artifact from a right hip arthroplasty. There is a large heterogeneous mass in the left paramidline pelvis, measuring 14.2 x 8.1 x 11.4 cm, with associated cystic components and/or fluid. Left ovary is not visualized as a separate structure. Uterus is seen to the right of the chest described mass (series 2, image 65). Other: Small pelvic free fluid. Omental haziness and nodularity are seen with an index omental nodule anterior to the gallbladder measuring 1.5 cm. Musculoskeletal: Right hip arthroplasty. Degenerative changes in the spine. No worrisome lytic or sclerotic lesions. IMPRESSION: 1. Large complex left paramidline pelvic mass likely rises from the left ovary. Together with left periaortic adenopathy and peritoneal  carcinomatosis, findings are worrisome for metastatic ovarian cancer. 2. Small pelvic free fluid. 3.  Aortic atherosclerosis (ICD10-170.0). Electronically Signed   By: MLorin PicketM.D.   On: 01/02/2018 14:48   Ct Biopsy  Result Date: 01/21/2018 INDICATION: No known primary, now with pelvic mass and omental nodules worrisome for metastatic disease. Please perform CT-guided biopsy for tissue diagnostic purposes. EXAM: CT-GUIDED BIOPSY OF OMENTAL NODULE COMPARISON:  CT abdomen pelvis - 01/02/2018 MEDICATIONS: None. ANESTHESIA/SEDATION: Fentanyl 50 mcg IV; Versed 1 mg IV Sedation time: 10 minutes; The patient was continuously monitored during the procedure by the interventional radiology nurse under my direct supervision. CONTRAST:  None. COMPLICATIONS: None immediate. PROCEDURE: Informed consent was obtained from the patient following an explanation of the procedure, risks, benefits and alternatives. A time out was performed prior to the initiation of the procedure. The patient was positioned supine on the CT table and a limited CT was performed for procedural planning demonstrating unchanged appearance of the dominant approximately 2.2 x 1.3 cm omental nodule within the right lower abdomen adjacent to the cecum (image 63, series 2). The procedure was planned. The operative site was prepped and draped in the usual sterile fashion. Appropriate trajectory was confirmed with a 22 gauge spinal needle after the adjacent tissues were anesthetized with 1% Lidocaine with epinephrine. Under intermittent CT guidance, a 17 gauge coaxial needle was advanced into the peripheral aspect of the mass. Appropriate positioning was confirmed and 5 core needle biopsy samples were obtained with an 18 gauge core needle biopsy device. The co-axial needle was removed and hemostasis was achieved with manual compression. A limited postprocedural CT was negative for hemorrhage or additional complication. A dressing was placed. The patient  tolerated the procedure well without immediate postprocedural complication. IMPRESSION: Technically successful  CT guided core needle biopsy of dominant omental nodule within the right lower abdominal quadrant, adjacent to the cecum. Electronically Signed   By: Sandi Mariscal M.D.   On: 01/21/2018 11:22   .   Outpatient Encounter Medications as of 01/25/2018  Medication Sig  . amLODipine (NORVASC) 2.5 MG tablet Take 5 mg by mouth daily.   Marland Kitchen ibuprofen (ADVIL,MOTRIN) 200 MG tablet Take 200 mg by mouth daily as needed. For pain   . levothyroxine (SYNTHROID, LEVOTHROID) 88 MCG tablet Take 88 mcg by mouth daily before breakfast.  . loratadine (CLARITIN) 10 MG tablet Take 10 mg by mouth daily.   . metoprolol succinate (TOPROL-XL) 50 MG 24 hr tablet Take 1 tablet by mouth daily.  . Multiple Vitamins-Minerals (MULTIVITAMINS THER. W/MINERALS) TABS Take 1 tablet by mouth daily.    Marland Kitchen olmesartan (BENICAR) 40 MG tablet Take 40 mg by mouth daily.   . [DISCONTINUED] meclizine (ANTIVERT) 25 MG tablet Take 1 tablet (25 mg total) by mouth 2 (two) times daily as needed for dizziness. (Patient not taking: Reported on 01/25/2018)   No facility-administered encounter medications on file as of 01/25/2018.    No Known Allergies  Past Medical History:  Diagnosis Date  . Allergy   . Anemia   . Blood transfusion without reported diagnosis   . Hypertension   . Hypothyroidism   . Osteoporosis 10/07   Dexa scan   Past Surgical History:  Procedure Laterality Date  . APPENDECTOMY  1950   not ruptured  . THYROIDECTOMY    . TONSILLECTOMY    . TOTAL HIP ARTHROPLASTY Right       Past Gynecological History:   GYNECOLOGIC HISTORY:  No LMP recorded. Patient is postmenopausal. . Menarche: 84 years old . P 4 . LMP 84 yo . Contraceptive 2 years OCP . HRT None  . Last Pap 12/2017 negative but no TZone seen due to atrophy Family Hx:  Family History  Problem Relation Age of Onset  . Diabetes Mother   . Stroke Mother   .  Breast cancer Sister 17       breast ca  . Diabetes Sister   . Lung cancer Brother   . Stroke Brother    Social Hx:  Marland Kitchen Tobacco use: none . Alcohol use: none . Illicit Drug use: none . Illicit IV Drug use: none    Review of Systems: Review of Systems  HENT:   Positive for hearing loss and lump/mass.   Musculoskeletal: Positive for arthralgias.  All other systems reviewed and are negative.  Vitals: Blood pressure (!) 152/68, pulse 82, temperature (!) 97.5 F (36.4 C), temperature source Oral, resp. rate 20, height _0  (1.676 m), weight 166 lb 3.2 oz (75.4 kg), SpO2 97 %. Vitals:   01/25/18 1440  Weight: 166 lb 11.2 oz (75.6 kg)  Height: _1  (1.676 m)    Body mass index is 26.91 kg/m.   Physical Exam: deferred today as discussion only 01/10/18  General :  Well developed, 84 y.o., female in no apparent distress HEENT:  Normocephalic/atraumatic, symmetric, EOMI, eyelids normal Neck:   Supple, no masses.  Lymphatics:  No cervical/ submandibular/ supraclavicular/ infraclavicular/ inguinal adenopathy Respiratory:  Respirations unlabored, no use of accessory muscles CV:   Deferred Breast:  Deferred Musculoskeletal: No CVA tenderness, normal muscle strength. Abdomen:  Soft, non-tender and nondistended. No evidence of hernia. Palpable mass. Extremities:  No lymphedema, no erythema, non-tender. Skin:   Normal inspection Neuro/Psych:  No focal motor deficit, no  abnormal mental status. Normal gait. Normal affect. Alert and oriented to person, place, and time  Genito Urinary: Vulva: Normal external female genitalia.  Bladder/urethra: Urethral meatus normal in size and location. No lesions or   masses, well supported bladder Speculum exam: Vagina: atrophic. No lesion, no discharge, no bleeding. Cervix: Normal appearing, no lesions. Bimanual exam: palpable disease in [place], nodular, no breakthrough to vagina.  Uterus: Normal size, mobile.  Adnexa: palpable disease in culdesac  deviating to patient's left.  Rectovaginal:  + disease palpable but no breakthrough into rectal mucsosa. Cannot separate from anterior rectal wall. Starting about 6-7cm from anal verge. Good tone, no parametrial involvement   Assessment  Pelvic mass with paraaortic adenopathy and omental disease  Plan  1. Data reviewed o I reviewed Dr. Alvy Bimler note from the office visit yesterday and agree that this is a highly unusual histology in her age group o We have previously reviewed the images and we again discussed those today 2. Overall concern is for ovarian cancer o With the possibility of unusual histology I recommend we proceed with diagnostic laparoscopy and biopsies 3. Treatment planning o Last visit we reviewed primary surgical management and the risks that in her case could include inability to resect all disease (left paraaortic lymph node near left renal vein) and possible colostomy / bowel reanastomosis with potential for leak. o I am inclined to favor neoadjuvant chemotherapy for the reasons above however with the germ cell read this is more complicated o We can put in for referral to [facility] now which is what I recommend if this ends up being advanced/unresectable germ cell tumor.  4. Family is very anxious with concerns about length of time from first presenting 2 weeks ago despite having undergone biopsy (rushed) and consult with Dr.Gorsuch in the interim. o We will do our best to get her scheduled as soon as OR time is available for [place] to proceed. o The diagnostic scope is planned for 6 days from now. Could plan open surgery if indicated 8/27 which is in another 2.5 weeks.  5. Pending items o Chest imaging needs to be obtained at some point, I have ordered a CTChest o +/- PET scan pending further tissue diagnosis o CEA was normal  Face to face time with patient was >15 minutes. Over 50% of this time was spent on counseling and coordination of care.  [name], MD   01/25/2018, 2:48 PM   Cc: Marton Redwood, MD (Referring PCP)

## 2018-01-25 NOTE — Progress Notes (Signed)
[name], [name]  ASSESSMENT & PLAN:  Ovarian cancer [facility]) We had extensive discussion today, along with family members The tissue biopsy revealed probability of germ cell tumor, which is highly unusual We discussed the possibility of sampling error We discussed the risk and benefits of pursuing further biopsy versus upfront surgical debulking Given extensive disease, optimal surgical debulking might not be possible but I would defer that discussion to the GYN oncologist We discussed the risk and benefits of pursuing second opinion elsewhere At this point in time, she is not symptomatic and I think there is time to pursue second opinion elsewhere if she is interested.  I would not recommend neoadjuvant chemotherapy for germ cell tumor protocol given her age and likelihood of significant toxicity of treatment We discussed the role of antiestrogen therapy given strong ER/PR positivity in the tumor cells but that would not be a recommended first choice of treatment, but might be helpful in the future as maintenance therapy I recommend adding tumor markers to aid in diagnosis She would need CT scan of the chest to complete staging After extensive discussion, she is in agreement to discuss the role of surgery with GYN oncologist again and an appointment is made for her to return on 01/25/2018   No orders of the defined types were placed in this encounter.    CHIEF COMPLAINTS/PURPOSE OF CONSULTATION:  Ovarian cancer, for further management  HISTORY OF PRESENTING ILLNESS:  Shannon Ellis 84 y.o. female is here because of recent diagnosis of ovarian cancer. Shannon Ellis is here with her son, Shannon Ellis, daughter-in-law, Shannon Ellis and granddaughter, Shannon Ellis The patient has no complaints of any symptoms when she saw her primary care doctor on December 17, 2017. Retrospectively looking, she noticed some abdominal  swelling and weight gain which she attributed to aging. During her evaluation with her primary care doctor, she was noted to have firm suprapubic mass. She was recommended to undergo CT imaging. Her last normal Pap smear was several years ago, and it was noted to be normal on recent repeat Pap smear. The patient has declined pelvic examination since she return the age of 62. She denies abnormal vaginal discharge, nausea, changes in bowel habits or abdominal pain. She was subsequently referred after CT imaging showed clinical suspicion for ovarian cancer  I have reviewed her chart and materials related to her cancer extensively and collaborated history with the patient. Summary of oncologic history is as follows:   Ovarian cancer ([place])   01/02/2018 Imaging    1. Large complex left paramidline pelvic mass likely rises from the left ovary. Together with left periaortic adenopathy and peritoneal carcinomatosis, findings are worrisome for metastatic ovarian cancer. 2. Small pelvic free fluid. 3.  Aortic atherosclerosis (ICD10-170.0).    01/21/2018 Pathology Results    Omentum, biopsy, dominant omental nodule within the right lower abdominal quadrant - POORLY DIFFERENTIATED CARCINOMA CONSISTENT WITH GYNECOLOGIC PRIMARY. - SEE MICROSCOPIC DESCRIPTION. Microscopic Comment The tumor is characterized by sheets of tumor cells with enlarged nuclei, many containing prominent nucleoli. There are patchy areas with eosinophilic cytoplasmic inclusions. Immunohistochemistry shows positivity with cytokeratin AE1/AE3, cytokeratin 7, estrogen receptor, progesterone receptor, PAX-8, WT-1, and placental alkaline phosphatase (PLAP). The tumor is negative with alpha fetoprotein, human chorionic gonadotropin, CD117, CD10, CD30, CD56, CDX-2, CEA, cytokeratin 20, S100, SOX-10, synaptophysin, chromogranin, D2-40, GATA-3, GCDFP, Glypican 3 and Inhibin. The morphology and immunophenotype are most suggestive of a germ cell  tumor  including dysgerminoma. The estrogen receptor is 100% with strong staining and the progesterone receptor is 90% with strong staining.    01/21/2018 Procedure    Technically successful CT guided core needle biopsy of dominant omental nodule within the right lower abdominal quadrant, adjacent to the cecum.    01/24/2018 Cancer Staging    Staging form: Ovary, Fallopian Tube, and Primary Peritoneal Carcinoma, AJCC 8th Edition - Clinical: Stage IIIC (cT3c, cN1b, cM0) - Signed by [name], MD on 01/24/2018    She had colonoscopy around 2016 and that was negative.  MEDICAL HISTORY:  Past Medical History:  Diagnosis Date  . Allergy   . Anemia   . Blood transfusion without reported diagnosis   . Hypertension   . Hypothyroidism   . Osteoporosis 10/07   Dexa scan    SURGICAL HISTORY: Past Surgical History:  Procedure Laterality Date  . APPENDECTOMY  1950   not ruptured  . THYROIDECTOMY    . TONSILLECTOMY    . TOTAL HIP ARTHROPLASTY Right     SOCIAL HISTORY: Social History   Socioeconomic History  . Marital status: Single    Spouse name: Not on file  . Number of children: 4  . Years of education: Not on file  . Highest education level: Not on file  Occupational History  . Occupation: retired  Scientific laboratory technician  . Financial resource strain: Not on file  . Food insecurity:    Worry: Not on file    Inability: Not on file  . Transportation needs:    Medical: Not on file    Non-medical: Not on file  Tobacco Use  . Smoking status: Never Smoker  . Smokeless tobacco: Never Used  Substance and Sexual Activity  . Alcohol use: No    Alcohol/week: 0.0 standard drinks  . Drug use: No  . Sexual activity: Not on file  Lifestyle  . Physical activity:    Days per week: Not on file    Minutes per session: Not on file  . Stress: Not on file  Relationships  . Social connections:    Talks on phone: Not on file    Gets together: Not on file    Attends religious service: Not on file     Active member of club or organization: Not on file    Attends meetings of clubs or organizations: Not on file    Relationship status: Not on file  . Intimate partner violence:    Fear of current or ex partner: Not on file    Emotionally abused: Not on file    Physically abused: Not on file    Forced sexual activity: Not on file  Other Topics Concern  . Not on file  Social History Narrative  . Not on file    FAMILY HISTORY: Family History  Problem Relation Age of Onset  . Diabetes Mother   . Stroke Mother   . Breast cancer Sister 20       breast ca  . Diabetes Sister   . Lung cancer Brother   . Stroke Brother     ALLERGIES:  has No Known Allergies.  MEDICATIONS:  Current Outpatient Medications  Medication Sig Dispense Refill  . amLODipine (NORVASC) 2.5 MG tablet Take 5 mg by mouth daily.     Marland Kitchen ibuprofen (ADVIL,MOTRIN) 200 MG tablet Take 200 mg by mouth daily as needed. For pain     . levothyroxine (SYNTHROID, LEVOTHROID) 88 MCG tablet Take 88 mcg by mouth daily  before breakfast.    . loratadine (CLARITIN) 10 MG tablet Take 10 mg by mouth daily.     . meclizine (ANTIVERT) 25 MG tablet Take 1 tablet (25 mg total) by mouth 2 (two) times daily as needed for dizziness. 20 tablet 0  . metoprolol succinate (TOPROL-XL) 50 MG 24 hr tablet Take 1 tablet by mouth daily.    . Multiple Vitamins-Minerals (MULTIVITAMINS THER. W/MINERALS) TABS Take 1 tablet by mouth daily.      Marland Kitchen olmesartan (BENICAR) 40 MG tablet Take 40 mg by mouth daily.      No current facility-administered medications for this visit.     REVIEW OF SYSTEMS:   Constitutional: Denies fevers, chills or abnormal night sweats Eyes: Denies blurriness of vision, double vision or watery eyes Ears, nose, mouth, throat, and face: Denies mucositis or sore throat Respiratory: Denies cough, dyspnea or wheezes Cardiovascular: Denies palpitation, chest discomfort or lower extremity swelling Gastrointestinal:  Denies nausea,  heartburn or change in bowel habits Skin: Denies abnormal skin rashes Lymphatics: Denies new lymphadenopathy or easy bruising Neurological:Denies numbness, tingling or new weaknesses Behavioral/Psych: Mood is stable, no new changes  All other systems were reviewed with the patient and are negative.  PHYSICAL EXAMINATION: ECOG PERFORMANCE STATUS: ECOG 0  Vitals:   01/24/18 1350  BP: (!) 163/63  Pulse: 65  Resp: 18  Temp: 97.6 F (36.4 C)  SpO2: 97%   Filed Weights   01/24/18 1350  Weight: 167 lb 3.2 oz (75.8 kg)    GENERAL:alert, no distress and comfortable SKIN: skin color, texture, turgor are normal, no rashes or significant lesions EYES: normal, conjunctiva are pink and non-injected, sclera clear OROPHARYNX:no exudate, no erythema and lips, buccal mucosa, and tongue normal  NECK: Well-healed thyroidectomy scar LYMPH:  no palpable lymphadenopathy in the cervical, axillary or inguinal LUNGS: clear to auscultation and percussion with normal breathing effort HEART: regular rate & rhythm and no murmurs and no lower extremity edema ABDOMEN:abdomen soft, non-tender and normal bowel sounds.  Palpable abdominal mass Musculoskeletal:no cyanosis of digits and no clubbing  PSYCH: alert & oriented x 3 with fluent speech NEURO: no focal motor/sensory deficits  LABORATORY DATA:  I have reviewed the data as listed Lab Results  Component Value Date   WBC 8.1 01/21/2018   HGB 13.3 01/21/2018   HCT 41.8 01/21/2018   MCV 89.3 01/21/2018   PLT 402 (H) 01/21/2018   Recent Labs    01/21/18 0900  NA 138  K 4.3  CL 104  CO2 26  GLUCOSE 112*  BUN 13  CREATININE 0.68  CALCIUM 9.3  GFRNONAA >60  GFRAA >60  PROT 8.2*  ALBUMIN 4.2  AST 36  ALT 20  ALKPHOS 302*  BILITOT 0.6    RADIOGRAPHIC STUDIES: I have reviewed imaging study with multiple family members I have personally reviewed the radiological images as listed and agreed with the findings in the report. Ct Abdomen  Pelvis W Contrast  Result Date: 01/02/2018 CLINICAL DATA:  Pelvis swelling/lump on physical exam. Slight weight gain. EXAM: CT ABDOMEN AND PELVIS WITH CONTRAST TECHNIQUE: Multidetector CT imaging of the abdomen and pelvis was performed using the standard protocol following bolus administration of intravenous contrast. CONTRAST:  110m ISOVUE-300 IOPAMIDOL (ISOVUE-300) INJECTION 61% COMPARISON:  02/19/2013. FINDINGS: Lower chest: 6 mm subpleural right lower lobe nodule is unchanged and considered benign. Mild scarring in both lower lobes. Heart is at the upper limits of normal in size. No pericardial or pleural effusion. Hepatobiliary: Liver and gallbladder  are unremarkable. No biliary ductal dilatation. Pancreas: Negative. Spleen: Negative. Adrenals/Urinary Tract: Adrenal glands are unremarkable. Subcentimeter low-attenuation lesions in the kidneys are too small to characterize. Ureters are decompressed. Bladder is low in volume and largely obscured by streak artifact from a right hip arthroplasty. Stomach/Bowel: Stomach, small bowel and colon are unremarkable. Appendix is not readily visualized. Vascular/Lymphatic: Atherosclerotic calcification of the arterial vasculature without abdominal aortic aneurysm. Left periaortic lymph node measures 11 mm (image 28). Reproductive: Low pelvic structures are somewhat obscured by streak artifact from a right hip arthroplasty. There is a large heterogeneous mass in the left paramidline pelvis, measuring 14.2 x 8.1 x 11.4 cm, with associated cystic components and/or fluid. Left ovary is not visualized as a separate structure. Uterus is seen to the right of the chest described mass (series 2, image 65). Other: Small pelvic free fluid. Omental haziness and nodularity are seen with an index omental nodule anterior to the gallbladder measuring 1.5 cm. Musculoskeletal: Right hip arthroplasty. Degenerative changes in the spine. No worrisome lytic or sclerotic lesions. IMPRESSION:  1. Large complex left paramidline pelvic mass likely rises from the left ovary. Together with left periaortic adenopathy and peritoneal carcinomatosis, findings are worrisome for metastatic ovarian cancer. 2. Small pelvic free fluid. 3.  Aortic atherosclerosis (ICD10-170.0). Electronically Signed   By: Lorin Picket M.D.   On: 01/02/2018 14:48   Ct Biopsy  Result Date: 01/21/2018 INDICATION: No known primary, now with pelvic mass and omental nodules worrisome for metastatic disease. Please perform CT-guided biopsy for tissue diagnostic purposes. EXAM: CT-GUIDED BIOPSY OF OMENTAL NODULE COMPARISON:  CT abdomen pelvis - 01/02/2018 MEDICATIONS: None. ANESTHESIA/SEDATION: Fentanyl 50 mcg IV; Versed 1 mg IV Sedation time: 10 minutes; The patient was continuously monitored during the procedure by the interventional radiology nurse under my direct supervision. CONTRAST:  None. COMPLICATIONS: None immediate. PROCEDURE: Informed consent was obtained from the patient following an explanation of the procedure, risks, benefits and alternatives. A time out was performed prior to the initiation of the procedure. The patient was positioned supine on the CT table and a limited CT was performed for procedural planning demonstrating unchanged appearance of the dominant approximately 2.2 x 1.3 cm omental nodule within the right lower abdomen adjacent to the cecum (image 63, series 2). The procedure was planned. The operative site was prepped and draped in the usual sterile fashion. Appropriate trajectory was confirmed with a 22 gauge spinal needle after the adjacent tissues were anesthetized with 1% Lidocaine with epinephrine. Under intermittent CT guidance, a 17 gauge coaxial needle was advanced into the peripheral aspect of the mass. Appropriate positioning was confirmed and 5 core needle biopsy samples were obtained with an 18 gauge core needle biopsy device. The co-axial needle was removed and hemostasis was achieved with  manual compression. A limited postprocedural CT was negative for hemorrhage or additional complication. A dressing was placed. The patient tolerated the procedure well without immediate postprocedural complication. IMPRESSION: Technically successful CT guided core needle biopsy of dominant omental nodule within the right lower abdominal quadrant, adjacent to the cecum. Electronically Signed   By: Sandi Mariscal M.D.   On: 01/21/2018 11:22    I spent 55 minutes counseling the patient face to face. The total time spent in the appointment was 60 minutes and more than 50% was on counseling.  All questions were answered. The patient knows to call the clinic with any problems, questions or concerns.  [name], MD 01/25/2018 7:43 AM

## 2018-01-25 NOTE — Assessment & Plan Note (Addendum)
We had extensive discussion today, along with family members The tissue biopsy revealed probability of germ cell tumor, which is highly unusual We discussed the possibility of sampling error We discussed the risk and benefits of pursuing further biopsy versus upfront surgical debulking Given extensive disease, optimal surgical debulking might not be possible but I would defer that discussion to the GYN oncologist We discussed the risk and benefits of pursuing second opinion elsewhere At this point in time, she is not symptomatic and I think there is time to pursue second opinion elsewhere if she is interested.  I would not recommend neoadjuvant chemotherapy for germ cell tumor protocol given her age and likelihood of significant toxicity of treatment We discussed the role of antiestrogen therapy given strong ER/PR positivity in the tumor cells but that would not be a recommended first choice of treatment, but might be helpful in the future as maintenance therapy I recommend adding tumor markers to aid in diagnosis She would need CT scan of the chest to complete staging After extensive discussion, she is in agreement to discuss the role of surgery with GYN oncologist again and an appointment is made for her to return on 01/25/2018

## 2018-01-25 NOTE — Patient Instructions (Signed)
Preparing for your Surgery  Plan for surgery on January 31, 2018 with Dr. Precious Haws at [facility] will be scheduled for a diagnostic laparoscopy with biopsies.  Pre-operative Testing -You will receive a phone call from presurgical testing at [facility] to arrange for a pre-operative testing appointment before your surgery.  This appointment normally occurs one to two weeks before your scheduled surgery.   -Bring your insurance card, copy of an advanced directive if applicable, medication list  -At that visit, you will be asked to sign a consent for a possible blood transfusion in case a transfusion becomes necessary during surgery.  The need for a blood transfusion is rare but having consent is a necessary part of your care.     -You should not be taking blood thinners or aspirin at least ten days prior to surgery unless instructed by your surgeon.  Day Before Surgery at [place] will be asked to take in a light diet the day before surgery.  Avoid carbonated beverages.  You will be advised to have nothing to eat or drink after midnight the evening before.    Eat a light diet the day before surgery.  Examples including soups, broths, toast, yogurt, mashed potatoes.  Things to avoid include carbonated beverages (fizzy beverages), raw fruits and raw vegetables, or beans.   If your bowels are filled with gas, your surgeon will have difficulty visualizing your pelvic organs which increases your surgical risks.  Your role in recovery Your role is to become active as soon as directed by your doctor, while still giving yourself time to heal.  Rest when you feel tired. You will be asked to do the following in order to speed your recovery:  - Cough and breathe deeply. This helps toclear and expand your lungs and can prevent pneumonia. You may be given a spirometer to practice deep breathing. A staff member will show you how to use the spirometer. - Do mild  physical activity. Walking or moving your legs help your circulation and body functions return to normal. A staff member will help you when you try to walk and will provide you with simple exercises. Do not try to get up or walk alone the first time. - Actively manage your pain. Managing your pain lets you move in comfort. We will ask you to rate your pain on a scale of zero to 10. It is your responsibility to tell your doctor or nurse where and how much you hurt so your pain can be treated.  Special Considerations -If you are diabetic, you may be placed on insulin after surgery to have closer control over your blood sugars to promote healing and recovery.  This does not mean that you will be discharged on insulin.  If applicable, your oral antidiabetics will be resumed when you are tolerating a solid diet.  -Your final pathology results from surgery should be available by the Friday after surgery and the results will be relayed to you when available.   Blood Transfusion Information WHAT IS A BLOOD TRANSFUSION? A transfusion is the replacement of blood or some of its parts. Blood is made up of multiple cells which provide different functions.  Red blood cells carry oxygen and are used for blood loss replacement.  White blood cells fight against infection.  Platelets control bleeding.  Plasma helps clot blood.  Other blood products are available for specialized needs, such as hemophilia or other clotting disorders. BEFORE THE TRANSFUSION  Who gives blood for transfusions?   You may be able to donate blood to be used at a later date on yourself (autologous donation).  Relatives can be asked to donate blood. This is generally not any safer than if you have received blood from a stranger. The same precautions are taken to ensure safety when a relative's blood is donated.  Healthy volunteers who are fully evaluated to make sure their blood is safe. This is blood bank blood. Transfusion  therapy is the safest it has ever been in the practice of medicine. Before blood is taken from a donor, a complete history is taken to make sure that person has no history of diseases nor engages in risky social behavior (examples are intravenous drug use or sexual activity with multiple partners). The donor's travel history is screened to minimize risk of transmitting infections, such as malaria. The donated blood is tested for signs of infectious diseases, such as HIV and hepatitis. The blood is then tested to be sure it is compatible with you in order to minimize the chance of a transfusion reaction. If you or a relative donates blood, this is often done in anticipation of surgery and is not appropriate for emergency situations. It takes many days to process the donated blood. RISKS AND COMPLICATIONS Although transfusion therapy is very safe and saves many lives, the main dangers of transfusion include:   Getting an infectious disease.  Developing a transfusion reaction. This is an allergic reaction to something in the blood you were given. Every precaution is taken to prevent this. The decision to have a blood transfusion has been considered carefully by your caregiver before blood is given. Blood is not given unless the benefits outweigh the risks.

## 2018-01-25 NOTE — H&P (View-Only) (Signed)
[place] at [facility]   Progress Note: Established Patient FIRST VISIT  Consult was originally requested by Dr. Marton Redwood for a palpable mass that on workup was found to be c/w an adnexal mass with associated carcinomatosis and periaortic adenopathy (1.14x2.17cm)  Chief Complaint  Patient presents with  . Malignant neoplasm of left ovary [facility])    HPI: [name]  is an 84 y.o.  P4   Interval History Since her last visit we arranged a biopsy of the omentum. That returned 01/21/2018 as poorly differentiated carcinoma with stains +PAX8, WT1, ER/PR, CK7, and +AE1/AE3, PLAP. Negative for multiple other markers with a read the morphology and immunophenotype are suggestive of a germ cell tumor.  Given the unusual histology Dr. Alvy Bimler is concerned with upfront neoadjuvant chemotherapy. She is seeing me back to discuss options.  Recall  CA125 is 1492 and she has carcinomatosis/omental cake and a 2.1x1.1 cm paraaortic node.   Presenting History She was seen by her PCP, Dr. Brigitte Pulse, for an annual exam and on palpation of the abdomen a mass was noted. Workup included a CTScan AP 01/02/18 revealing a large complex pelvic mass, carcinomatosis with omental nodularity, and associated left periaortic adenopathy (measured by me at 1.14 x 2.17cm). No chest imaging.  01/03/18 CA125 was obtained and equal to 1492. She was therefore referred for recommendations.  She has noted some fatigue and apathy. No pain. She discusses some discomfort that she first noted 3 years ago in her LLQ but the family states it was elsewhere that she was having pain then. Denies bleeding, vaginal dc. She has had about 3-4 months of early satiety but no weight loss, no reflux. Some slight constipation but no big change in bowel movements. Last colonoscopy was 2-3 years ago.    Imported EPIC Oncologic History: TBD   Ovarian cancer ([place])   01/02/2018 Imaging    1. Large complex  left paramidline pelvic mass likely rises from the left ovary. Together with left periaortic adenopathy and peritoneal carcinomatosis, findings are worrisome for metastatic ovarian cancer. 2. Small pelvic free fluid. 3.  Aortic atherosclerosis (ICD10-170.0).    01/21/2018 Pathology Results    Omentum, biopsy, dominant omental nodule within the right lower abdominal quadrant - POORLY DIFFERENTIATED CARCINOMA CONSISTENT WITH GYNECOLOGIC PRIMARY. - SEE MICROSCOPIC DESCRIPTION. Microscopic Comment The tumor is characterized by sheets of tumor cells with enlarged nuclei, many containing prominent nucleoli. There are patchy areas with eosinophilic cytoplasmic inclusions. Immunohistochemistry shows positivity with cytokeratin AE1/AE3, cytokeratin 7, estrogen receptor, progesterone receptor, PAX-8, WT-1, and placental alkaline phosphatase (PLAP). The tumor is negative with alpha fetoprotein, human chorionic gonadotropin, CD117, CD10, CD30, CD56, CDX-2, CEA, cytokeratin 20, S100, SOX-10, synaptophysin, chromogranin, D2-40, GATA-3, GCDFP, Glypican 3 and Inhibin. 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 The estrogen receptor is 100% with strong staining and the progesterone receptor is 90% with strong staining.    01/21/2018 Procedure    Technically successful CT guided core needle biopsy of dominant omental nodule within the right lower abdominal quadrant, adjacent to the cecum.    01/24/2018 Cancer Staging    Staging form: Ovary, Fallopian Tube, and Primary Peritoneal Carcinoma, AJCC 8th Edition - Clinical: Stage IIIC (cT3c, cN1b, cM0) - Signed by [name], MD on 01/24/2018     ECOG PERFORMANCE STATUS: 0 - Asymptomatic  Measurement of disease:  CA125 . 01/04/18 1492 (preop)   Radiology: Ct Abdomen Pelvis W Contrast  Result Date: 01/02/2018 CLINICAL DATA:  Pelvis swelling/lump on physical exam. Slight weight gain. EXAM: CT ABDOMEN AND PELVIS  WITH CONTRAST TECHNIQUE: Multidetector CT imaging of the abdomen and pelvis was performed using the standard protocol following bolus administration of intravenous contrast. CONTRAST:  153m ISOVUE-300 IOPAMIDOL (ISOVUE-300) INJECTION 61% COMPARISON:  02/19/2013. FINDINGS: Lower chest: 6 mm subpleural right lower lobe nodule is unchanged and considered benign. Mild scarring in both lower lobes. Heart is at the upper limits of normal in size. No pericardial or pleural effusion. Hepatobiliary: Liver and gallbladder are unremarkable. No biliary ductal dilatation. Pancreas: Negative. Spleen: Negative. Adrenals/Urinary Tract: Adrenal glands are unremarkable. Subcentimeter low-attenuation lesions in the kidneys are too small to characterize. Ureters are decompressed. Bladder is low in volume and largely obscured by streak artifact from a right hip arthroplasty. Stomach/Bowel: Stomach, small bowel and colon are unremarkable. Appendix is not readily visualized. Vascular/Lymphatic: Atherosclerotic calcification of the arterial vasculature without abdominal aortic aneurysm. Left periaortic lymph node measures 11 mm (image 28). Reproductive: Low pelvic structures are somewhat obscured by streak artifact from a right hip arthroplasty. There is a large heterogeneous mass in the left paramidline pelvis, measuring 14.2 x 8.1 x 11.4 cm, with associated cystic components and/or fluid. Left ovary is not visualized as a separate structure. Uterus is seen to the right of the chest described mass (series 2, image 65). Other: Small pelvic free fluid. Omental haziness and nodularity are seen with an index omental nodule anterior to the gallbladder measuring 1.5 cm. Musculoskeletal: Right hip arthroplasty. Degenerative changes in the spine. No worrisome lytic or sclerotic lesions. IMPRESSION: 1. Large complex left paramidline pelvic mass likely rises from the left ovary. Together with left periaortic adenopathy and peritoneal  carcinomatosis, findings are worrisome for metastatic ovarian cancer. 2. Small pelvic free fluid. 3.  Aortic atherosclerosis (ICD10-170.0). Electronically Signed   By: MLorin PicketM.D.   On: 01/02/2018 14:48   Ct Biopsy  Result Date: 01/21/2018 INDICATION: No known primary, now with pelvic mass and omental nodules worrisome for metastatic disease. Please perform CT-guided biopsy for tissue diagnostic purposes. EXAM: CT-GUIDED BIOPSY OF OMENTAL NODULE COMPARISON:  CT abdomen pelvis - 01/02/2018 MEDICATIONS: None. ANESTHESIA/SEDATION: Fentanyl 50 mcg IV; Versed 1 mg IV Sedation time: 10 minutes; The patient was continuously monitored during the procedure by the interventional radiology nurse under my direct supervision. CONTRAST:  None. COMPLICATIONS: None immediate. PROCEDURE: Informed consent was obtained from the patient following an explanation of the procedure, risks, benefits and alternatives. A time out was performed prior to the initiation of the procedure. The patient was positioned supine on the CT table and a limited CT was performed for procedural planning demonstrating unchanged appearance of the dominant approximately 2.2 x 1.3 cm omental nodule within the right lower abdomen adjacent to the cecum (image 63, series 2). The procedure was planned. The operative site was prepped and draped in the usual sterile fashion. Appropriate trajectory was confirmed with a 22 gauge spinal needle after the adjacent tissues were anesthetized with 1% Lidocaine with epinephrine. Under intermittent CT guidance, a 17 gauge coaxial needle was advanced into the peripheral aspect of the mass. Appropriate positioning was confirmed and 5 core needle biopsy samples were obtained with an 18 gauge core needle biopsy device. The co-axial needle was removed and hemostasis was achieved with manual compression. A limited postprocedural CT was negative for hemorrhage or additional complication. A dressing was placed. The patient  tolerated the procedure well without immediate postprocedural complication. IMPRESSION: Technically successful  CT guided core needle biopsy of dominant omental nodule within the right lower abdominal quadrant, adjacent to the cecum. Electronically Signed   By: Sandi Mariscal M.D.   On: 01/21/2018 11:22   .   Outpatient Encounter Medications as of 01/25/2018  Medication Sig  . amLODipine (NORVASC) 2.5 MG tablet Take 5 mg by mouth daily.   Marland Kitchen ibuprofen (ADVIL,MOTRIN) 200 MG tablet Take 200 mg by mouth daily as needed. For pain   . levothyroxine (SYNTHROID, LEVOTHROID) 88 MCG tablet Take 88 mcg by mouth daily before breakfast.  . loratadine (CLARITIN) 10 MG tablet Take 10 mg by mouth daily.   . metoprolol succinate (TOPROL-XL) 50 MG 24 hr tablet Take 1 tablet by mouth daily.  . Multiple Vitamins-Minerals (MULTIVITAMINS THER. W/MINERALS) TABS Take 1 tablet by mouth daily.    Marland Kitchen olmesartan (BENICAR) 40 MG tablet Take 40 mg by mouth daily.   . [DISCONTINUED] meclizine (ANTIVERT) 25 MG tablet Take 1 tablet (25 mg total) by mouth 2 (two) times daily as needed for dizziness. (Patient not taking: Reported on 01/25/2018)   No facility-administered encounter medications on file as of 01/25/2018.    No Known Allergies  Past Medical History:  Diagnosis Date  . Allergy   . Anemia   . Blood transfusion without reported diagnosis   . Hypertension   . Hypothyroidism   . Osteoporosis 10/07   Dexa scan   Past Surgical History:  Procedure Laterality Date  . APPENDECTOMY  1950   not ruptured  . THYROIDECTOMY    . TONSILLECTOMY    . TOTAL HIP ARTHROPLASTY Right       Past Gynecological History:   GYNECOLOGIC HISTORY:  No LMP recorded. Patient is postmenopausal. . Menarche: 84 years old . P 4 . LMP 84 yo . Contraceptive 2 years OCP . HRT None  . Last Pap 12/2017 negative but no TZone seen due to atrophy Family Hx:  Family History  Problem Relation Age of Onset  . Diabetes Mother   . Stroke Mother   .  Breast cancer Sister 17       breast ca  . Diabetes Sister   . Lung cancer Brother   . Stroke Brother    Social Hx:  Marland Kitchen Tobacco use: none . Alcohol use: none . Illicit Drug use: none . Illicit IV Drug use: none    Review of Systems: Review of Systems  HENT:   Positive for hearing loss and lump/mass.   Musculoskeletal: Positive for arthralgias.  All other systems reviewed and are negative.  Vitals: Blood pressure (!) 152/68, pulse 82, temperature (!) 97.5 F (36.4 C), temperature source Oral, resp. rate 20, height _0  (1.676 m), weight 166 lb 3.2 oz (75.4 kg), SpO2 97 %. Vitals:   01/25/18 1440  Weight: 166 lb 11.2 oz (75.6 kg)  Height: _1  (1.676 m)    Body mass index is 26.91 kg/m.   Physical Exam: deferred today as discussion only 01/10/18  General :  Well developed, 84 y.o., female in no apparent distress HEENT:  Normocephalic/atraumatic, symmetric, EOMI, eyelids normal Neck:   Supple, no masses.  Lymphatics:  No cervical/ submandibular/ supraclavicular/ infraclavicular/ inguinal adenopathy Respiratory:  Respirations unlabored, no use of accessory muscles CV:   Deferred Breast:  Deferred Musculoskeletal: No CVA tenderness, normal muscle strength. Abdomen:  Soft, non-tender and nondistended. No evidence of hernia. Palpable mass. Extremities:  No lymphedema, no erythema, non-tender. Skin:   Normal inspection Neuro/Psych:  No focal motor deficit, no  abnormal mental status. Normal gait. Normal affect. Alert and oriented to person, place, and time  Genito Urinary: Vulva: Normal external female genitalia.  Bladder/urethra: Urethral meatus normal in size and location. No lesions or   masses, well supported bladder Speculum exam: Vagina: atrophic. No lesion, no discharge, no bleeding. Cervix: Normal appearing, no lesions. Bimanual exam: palpable disease in culdesac, nodular, no breakthrough to vagina.  Uterus: Normal size, mobile.  Adnexa: palpable disease in culdesac  deviating to patient's left.  Rectovaginal:  + disease palpable but no breakthrough into rectal mucsosa. Cannot separate from anterior rectal wall. Starting about 6-7cm from anal verge. Good tone, no parametrial involvement   Assessment  Pelvic mass with paraaortic adenopathy and omental disease  Plan  1. Data reviewed o I reviewed Dr. Alvy Bimler note from the office visit yesterday and agree that this is a highly unusual histology in her age group o We have previously reviewed the images and we again discussed those today 2. Overall concern is for ovarian cancer o With the possibility of unusual histology I recommend we proceed with diagnostic laparoscopy and biopsies 3. Treatment planning o Last visit we reviewed primary surgical management and the risks that in her case could include inability to resect all disease (left paraaortic lymph node near left renal vein) and possible colostomy / bowel reanastomosis with potential for leak. o I am inclined to favor neoadjuvant chemotherapy for the reasons above however with the germ cell read this is more complicated o We can put in for referral to [facility] now which is what I recommend if this ends up being advanced/unresectable germ cell tumor.  4. Family is very anxious with concerns about length of time from first presenting 2 weeks ago despite having undergone biopsy (rushed) and consult with Dr.Gorsuch in the interim. o We will do our best to get her scheduled as soon as OR time is available for [place] to proceed. o The diagnostic scope is planned for 6 days from now. Could plan open surgery if indicated 8/27 which is in another 2.5 weeks.  5. Pending items o Chest imaging needs to be obtained at some point, I have ordered a CTChest o +/- PET scan pending further tissue diagnosis o CEA was normal  Face to face time with patient was >15 minutes. Over 50% of this time was spent on counseling and coordination of care.  [name], MD   01/25/2018, 2:48 PM   Cc: Marton Redwood, MD (Referring PCP)

## 2018-01-26 LAB — BETA HCG QUANT (REF LAB)

## 2018-01-26 LAB — AFP TUMOR MARKER: AFP, Serum, Tumor Marker: 4.7 ng/mL (ref 0.0–8.3)

## 2018-01-28 ENCOUNTER — Telehealth: Payer: Self-pay | Admitting: *Deleted

## 2018-01-28 ENCOUNTER — Telehealth: Payer: Self-pay | Admitting: Oncology

## 2018-01-28 NOTE — Telephone Encounter (Signed)
I called patient's daughter back to let her know that our office is still working on the referral for a second opinion and that we would notify her when this appointment is set up.  Also verified with patient's daughter that the second surgical date is being held for mother on August 27th.  Patient's daughter was very appreciative and verbalized understanding.

## 2018-01-28 NOTE — Telephone Encounter (Signed)
Called [facility] to have patient scheduled for a second opinion.  They said the soonest appointment would be Thursday with Dr. Fermin Schwab.  Patient is scheduled for a biopsy on Thursday so she would have to wait until 02/04/18 for the next appointment with a new surgeon.    Called patient's daughter, [name], and advised that Dr. Fermin Schwab is who they would see at [facility] for a second opinion.  He is actually in [place] tomorrow and can see her mother at 12:15.  [name] said they would like to take that appointment.  Advised them to arrive at 12 pm to check in.

## 2018-01-28 NOTE — Telephone Encounter (Signed)
Patient's daughter [name] called to see when the second surgical date was scheduled for, and also patient's daughter called to see if our office was working on the referral to [facility]. I told patient's daughter that our office is working on the referral to [facility] and that we will notify her when that appointment is arranged, I also told the patient's daughter our office would also notify her of the second surgical date.  Patient's daughter was very appreciative and verbalized understanding.

## 2018-01-29 ENCOUNTER — Inpatient Hospital Stay (HOSPITAL_BASED_OUTPATIENT_CLINIC_OR_DEPARTMENT_OTHER): Payer: Medicare Other | Admitting: Gynecology

## 2018-01-29 ENCOUNTER — Encounter: Payer: Self-pay | Admitting: Gynecology

## 2018-01-29 VITALS — BP 144/67 | HR 63 | Temp 98.6°F | Resp 20 | Ht 66.0 in | Wt 166.0 lb

## 2018-01-29 DIAGNOSIS — C562 Malignant neoplasm of left ovary: Secondary | ICD-10-CM

## 2018-01-29 DIAGNOSIS — R971 Elevated cancer antigen 125 [CA 125]: Secondary | ICD-10-CM | POA: Diagnosis not present

## 2018-01-29 DIAGNOSIS — Z79899 Other long term (current) drug therapy: Secondary | ICD-10-CM | POA: Diagnosis not present

## 2018-01-29 DIAGNOSIS — Z5111 Encounter for antineoplastic chemotherapy: Secondary | ICD-10-CM | POA: Diagnosis not present

## 2018-01-29 DIAGNOSIS — C786 Secondary malignant neoplasm of retroperitoneum and peritoneum: Secondary | ICD-10-CM

## 2018-01-29 NOTE — Patient Instructions (Signed)
Dr. Mora Bellman recommendation is to proceed with the biopsy as planned for Thursday.

## 2018-01-29 NOTE — Progress Notes (Signed)
Consult Note: Gyn-Onc   Shannon Ellis 84 y.o. female  Chief Complaint  Patient presents with  . Malignant neoplasm of left ovary (HCC)    Assessment : Advanced ovarian cancer of uncertain histology.  The patient is having considerable difficulty processing her current status and is quite surprised that she has an advanced ovarian cancer.  The family, of course, is concerned about making a diagnosis and treatment plan as soon as possible.  Plan: I am in agreement with Dr. Gerarda Fraction plan to proceed with diagnostic laparoscopy on Thursday with an attempt to obtain additional tissue for further histologic evaluation.  Certainly, if there remains uncertainty as to the histology second opinion from another pathologist should be obtained.  Interval History: Patient presents today seeking a second opinion and clarification of a number of questions.  She has recently seen Dr. Gerarda Fraction and Dr. Alvy Bimler.  Apparently, there is some uncertainty as to the histology based on a biopsy.  A question of "germ cell" tumor has been raised by the pathologist.  It is noted that her germ cell tumor markers are negative well her Ca1 25 is considerably elevated.  In essence, the patient is having difficulty processing the fact that she has ovarian cancer and the extent to which it has metastasized.  I reviewed the findings of the CT scan and pathology with the patient and her family.  All of their questions were answered.  Review of Systems:10 point review of systems is negative except as noted in interval history.   Vitals: Blood pressure (!) 144/67, pulse 63, temperature 98.6 F (37 C), temperature source Oral, resp. rate 20, height 5\' 6"  (1.676 m), weight 166 lb (75.3 kg), SpO2 98 %.  Physical Exam: General : The patient is a healthy woman in no acute distress.       No Known Allergies  Past Medical History:  Diagnosis Date  . Allergy   . Anemia   . Blood transfusion without reported diagnosis   .  Hypertension   . Hypothyroidism   . Osteoporosis 10/07   Dexa scan    Past Surgical History:  Procedure Laterality Date  . APPENDECTOMY  1950   not ruptured  . THYROIDECTOMY    . TONSILLECTOMY    . TOTAL HIP ARTHROPLASTY Right     Current Outpatient Medications  Medication Sig Dispense Refill  . amLODipine (NORVASC) 5 MG tablet Take 5 mg by mouth daily at 6 PM.    . ibuprofen (ADVIL,MOTRIN) 200 MG tablet Take 200 mg by mouth daily as needed. For pain     . levothyroxine (SYNTHROID, LEVOTHROID) 88 MCG tablet Take 88 mcg by mouth daily before breakfast.    . loratadine (CLARITIN) 10 MG tablet Take 10 mg by mouth daily as needed for allergies (for congestion).     . metoprolol succinate (TOPROL-XL) 50 MG 24 hr tablet Take 50 mg by mouth daily. In the morning    . Multiple Vitamins-Minerals (MULTIVITAMINS THER. W/MINERALS) TABS Take 1 tablet by mouth daily.      Marland Kitchen olmesartan (BENICAR) 40 MG tablet Take 40 mg by mouth daily at 6 PM.      No current facility-administered medications for this visit.     Social History   Socioeconomic History  . Marital status: Single    Spouse name: Not on file  . Number of children: 4  . Years of education: Not on file  . Highest education level: Not on file  Occupational History  . Occupation: retired  Social Needs  . Financial resource strain: Not on file  . Food insecurity:    Worry: Not on file    Inability: Not on file  . Transportation needs:    Medical: Not on file    Non-medical: Not on file  Tobacco Use  . Smoking status: Never Smoker  . Smokeless tobacco: Never Used  Substance and Sexual Activity  . Alcohol use: No    Alcohol/week: 0.0 standard drinks  . Drug use: No  . Sexual activity: Not on file  Lifestyle  . Physical activity:    Days per week: Not on file    Minutes per session: Not on file  . Stress: Not on file  Relationships  . Social connections:    Talks on phone: Not on file    Gets together: Not on file     Attends religious service: Not on file    Active member of club or organization: Not on file    Attends meetings of clubs or organizations: Not on file    Relationship status: Not on file  . Intimate partner violence:    Fear of current or ex partner: Not on file    Emotionally abused: Not on file    Physically abused: Not on file    Forced sexual activity: Not on file  Other Topics Concern  . Not on file  Social History Narrative  . Not on file    Family History  Problem Relation Age of Onset  . Diabetes Mother   . Stroke Mother   . Breast cancer Sister 57       breast ca  . Diabetes Sister   . Lung cancer Brother   . Stroke Brother       Marti Sleigh, MD 01/29/2018, 12:52 PM

## 2018-01-29 NOTE — Patient Instructions (Addendum)
Shannon Ellis  01/29/2018   Your procedure is scheduled on: Thursday 01/31/2018  Report to [facility] Main  Entrance              Report to admitting at  0730  AM    Call this number if you have problems the morning of surgery [phone]      Eat a light diet the day before surgery.  Examples including soups, broths, toast, yogurt, mashed potatoes.  Things to avoid include carbonated beverages (fizzy beverages), raw fruits and raw vegetables, or beans.   If your bowels are filled with gas, your surgeon will have difficulty visualizing your pelvic organs which increases your surgical risks.    CLEAR LIQUID DIET   Foods Allowed                                                                     Foods Excluded  Coffee and tea, regular and decaf                             liquids that you cannot  Plain Jell-O in any flavor                                             see through such as: Fruit ices (not with fruit pulp)                                     milk, soups, orange juice  Iced Popsicles                                    All solid food Carbonated beverages, regular and diet                                    Cranberry, grape and apple juices Sports drinks like Gatorade Lightly seasoned clear broth or consume(fat free) Sugar, honey syrup  Sample Menu Breakfast                                Lunch                                     Supper Cranberry juice                    Beef broth                            Chicken broth Jell-O  Grape juice                           Apple juice Coffee or tea                        Jell-O                                      Popsicle                                                Coffee or tea                        Coffee or tea  _____________________________________________________________________                Remember :DO Not Eat food or drink liquids after  midnight!    Take these medicines the morning of surgery with A SIP OF WATER: Levothyroxine (Synthroid), Metoprolol succinate (Toprol-XL)                                 You may not have any metal on your body including hair pins and              piercings  Do not wear jewelry, make-up, lotions, powders or perfumes, deodorant             Do not wear nail polish.  Do not shave  48 hours prior to surgery.               Do not bring valuables to the hospital. [place].  Contacts, dentures or bridgework may not be worn into surgery.  Leave suitcase in the car. After surgery it may be brought to your room.     Patients discharged the day of surgery will not be allowed to drive home.  Name and phone number of your driver:daughter- Denika Krone or Myriam Forehand               Please read over the following fact sheets you were given: _____________________________________________________________________             [facility] - Preparing for Surgery Before surgery, you can play an important role.  Because skin is not sterile, your skin needs to be as free of germs as possible.  You can reduce the number of germs on your skin by washing with CHG (chlorahexidine gluconate) soap before surgery.  CHG is an antiseptic cleaner which kills germs and bonds with the skin to continue killing germs even after washing. Please DO NOT use if you have an allergy to CHG or antibacterial soaps.  If your skin becomes reddened/irritated stop using the CHG and inform your nurse when you arrive at Short Stay. Do not shave (including legs and underarms) for at least 48 hours prior to the first CHG shower.  You may shave your face/neck. Please follow these instructions carefully:  1.  Shower with CHG Soap the night before surgery and the  morning of Surgery.  2.  If you choose to wash your hair, wash your hair first as usual with your  normal  shampoo.  3.   After you shampoo, rinse your hair and body thoroughly to remove the  shampoo.                           4.  Use CHG as you would any other liquid soap.  You can apply chg directly  to the skin and wash                       Gently with a scrungie or clean washcloth.  5.  Apply the CHG Soap to your body ONLY FROM THE NECK DOWN.   Do not use on face/ open                           Wound or open sores. Avoid contact with eyes, ears mouth and genitals (private parts).                       Wash face,  Genitals (private parts) with your normal soap.             6.  Wash thoroughly, paying special attention to the area where your surgery  will be performed.  7.  Thoroughly rinse your body with warm water from the neck down.  8.  DO NOT shower/wash with your normal soap after using and rinsing off  the CHG Soap.                9.  Pat yourself dry with a clean towel.            10.  Wear clean pajamas.            11.  Place clean sheets on your bed the night of your first shower and do not  sleep with pets. Day of Surgery : Do not apply any lotions/deodorants the morning of surgery.  Please wear clean clothes to the hospital/surgery center.  FAILURE TO FOLLOW THESE INSTRUCTIONS MAY RESULT IN THE CANCELLATION OF YOUR SURGERY PATIENT SIGNATURE_________________________________  NURSE SIGNATURE__________________________________  ________________________________________________________________________   Adam Phenix  An incentive spirometer is a tool that can help keep your lungs clear and active. This tool measures how well you are filling your lungs with each breath. Taking long deep breaths may help reverse or decrease the chance of developing breathing (pulmonary) problems (especially infection) following:  A long period of time when you are unable to move or be active. BEFORE THE PROCEDURE   If the spirometer includes an indicator to show your best effort, your nurse or respiratory  therapist will set it to a desired goal.  If possible, sit up straight or lean slightly forward. Try not to slouch.  Hold the incentive spirometer in an upright position. INSTRUCTIONS FOR USE  1. Sit on the edge of your bed if possible, or sit up as far as you can in bed or on a chair. 2. Hold the incentive spirometer in an upright position. 3. Breathe out normally. 4. Place the mouthpiece in your mouth and seal your lips tightly around it. 5. Breathe in slowly and as deeply as possible, raising the piston or the ball toward the top of the column. 6. Hold your breath for 3-5 seconds or for as long as  possible. Allow the piston or ball to fall to the bottom of the column. 7. Remove the mouthpiece from your mouth and breathe out normally. 8. Rest for a few seconds and repeat Steps 1 through 7 at least 10 times every 1-2 hours when you are awake. Take your time and take a few normal breaths between deep breaths. 9. The spirometer may include an indicator to show your best effort. Use the indicator as a goal to work toward during each repetition. 10. After each set of 10 deep breaths, practice coughing to be sure your lungs are clear. If you have an incision (the cut made at the time of surgery), support your incision when coughing by placing a pillow or rolled up towels firmly against it. Once you are able to get out of bed, walk around indoors and cough well. You may stop using the incentive spirometer when instructed by your caregiver.  RISKS AND COMPLICATIONS  Take your time so you do not get dizzy or light-headed.  If you are in pain, you may need to take or ask for pain medication before doing incentive spirometry. It is harder to take a deep breath if you are having pain. AFTER USE  Rest and breathe slowly and easily.  It can be helpful to keep track of a log of your progress. Your caregiver can provide you with a simple table to help with this. If you are using the spirometer at home,  follow these instructions: SEEK MEDICAL CARE IF:   You are having difficultly using the spirometer.  You have trouble using the spirometer as often as instructed.  Your pain medication is not giving enough relief while using the spirometer.  You develop fever of 100.5 F (38.1 C) or higher. SEEK IMMEDIATE MEDICAL CARE IF:   You cough up bloody sputum that had not been present before.  You develop fever of 102 F (38.9 C) or greater.  You develop worsening pain at or near the incision site. MAKE SURE YOU:   Understand these instructions.  Will watch your condition.  Will get help right away if you are not doing well or get worse. Document Released: 10/16/2006 Document Revised: 08/28/2011 Document Reviewed: 12/17/2006 [employer] Patient Information 2014 [employer].   ________________________________________________________________________  WHAT IS A BLOOD TRANSFUSION? Blood Transfusion Information  A transfusion is the replacement of blood or some of its parts. Blood is made up of multiple cells which provide different functions.  Red blood cells carry oxygen and are used for blood loss replacement.  White blood cells fight against infection.  Platelets control bleeding.  Plasma helps clot blood.  Other blood products are available for specialized needs, such as hemophilia or other clotting disorders. BEFORE THE TRANSFUSION  Who gives blood for transfusions?   Healthy volunteers who are fully evaluated to make sure their blood is safe. This is blood bank blood. Transfusion therapy is the safest it has ever been in the practice of medicine. Before blood is taken from a donor, a complete history is taken to make sure that person has no history of diseases nor engages in risky social behavior (examples are intravenous drug use or sexual activity with multiple partners). The donor's travel history is screened to minimize risk of transmitting infections, such as malaria.  The donated blood is tested for signs of infectious diseases, such as HIV and hepatitis. The blood is then tested to be sure it is compatible with you in order to minimize the chance of a   transfusion reaction. If you or a relative donates blood, this is often done in anticipation of surgery and is not appropriate for emergency situations. It takes many days to process the donated blood. RISKS AND COMPLICATIONS Although transfusion therapy is very safe and saves many lives, the main dangers of transfusion include:   Getting an infectious disease.  Developing a transfusion reaction. This is an allergic reaction to something in the blood you were given. Every precaution is taken to prevent this. The decision to have a blood transfusion has been considered carefully by your caregiver before blood is given. Blood is not given unless the benefits outweigh the risks. AFTER THE TRANSFUSION  Right after receiving a blood transfusion, you will usually feel much better and more energetic. This is especially true if your red blood cells have gotten low (anemic). The transfusion raises the level of the red blood cells which carry oxygen, and this usually causes an energy increase.  The nurse administering the transfusion will monitor you carefully for complications. HOME CARE INSTRUCTIONS  No special instructions are needed after a transfusion. You may find your energy is better. Speak with your caregiver about any limitations on activity for underlying diseases you may have. SEEK MEDICAL CARE IF:   Your condition is not improving after your transfusion.  You develop redness or irritation at the intravenous (IV) site. SEEK IMMEDIATE MEDICAL CARE IF:  Any of the following symptoms occur over the next 12 hours:  Shaking chills.  You have a temperature by mouth above 102 F (38.9 C), not controlled by medicine.  Chest, back, or muscle pain.  People around you feel you are not acting correctly or are  confused.  Shortness of breath or difficulty breathing.  Dizziness and fainting.  You get a rash or develop hives.  You have a decrease in urine output.  Your urine turns a dark color or changes to pink, red, or brown. Any of the following symptoms occur over the next 10 days:  You have a temperature by mouth above 102 F (38.9 C), not controlled by medicine.  Shortness of breath.  Weakness after normal activity.  The white part of the eye turns yellow (jaundice).  You have a decrease in the amount of urine or are urinating less often.  Your urine turns a dark color or changes to pink, red, or brown. Document Released: 06/02/2000 Document Revised: 08/28/2011 Document Reviewed: 01/20/2008 [facility] Patient Information 2014 [place], [place].  _______________________________________________________________________

## 2018-01-30 ENCOUNTER — Other Ambulatory Visit: Payer: Self-pay

## 2018-01-30 ENCOUNTER — Encounter (HOSPITAL_COMMUNITY)
Admission: RE | Admit: 2018-01-30 | Discharge: 2018-01-30 | Disposition: A | Discharge status: Home / Self Care | Payer: Medicare Other | Source: Ambulatory Visit | Attending: Obstetrics | Admitting: Obstetrics

## 2018-01-30 ENCOUNTER — Ambulatory Visit (HOSPITAL_COMMUNITY)
Admission: RE | Admit: 2018-01-30 | Discharge: 2018-01-30 | Disposition: A | Discharge status: Home / Self Care | Payer: Medicare Other | Source: Ambulatory Visit | Attending: Gynecologic Oncology | Admitting: Gynecologic Oncology

## 2018-01-30 ENCOUNTER — Encounter (HOSPITAL_COMMUNITY): Payer: Self-pay

## 2018-01-30 DIAGNOSIS — R1900 Intra-abdominal and pelvic swelling, mass and lump, unspecified site: Secondary | ICD-10-CM | POA: Insufficient documentation

## 2018-01-30 DIAGNOSIS — I517 Cardiomegaly: Secondary | ICD-10-CM | POA: Diagnosis not present

## 2018-01-30 DIAGNOSIS — R0989 Other specified symptoms and signs involving the circulatory and respiratory systems: Secondary | ICD-10-CM | POA: Diagnosis not present

## 2018-01-30 HISTORY — DX: Adverse effect of unspecified anesthetic, initial encounter: T41.45XA

## 2018-01-30 HISTORY — DX: Other specified postprocedural states: Z98.890

## 2018-01-30 HISTORY — DX: Other specified postprocedural states: R11.2

## 2018-01-30 HISTORY — DX: Other complications of anesthesia, initial encounter: T88.59XA

## 2018-01-30 HISTORY — DX: Family history of other specified conditions: Z84.89

## 2018-01-30 LAB — URINALYSIS, ROUTINE W REFLEX MICROSCOPIC
Bilirubin Urine: NEGATIVE
Glucose, UA: NEGATIVE mg/dL
Hgb urine dipstick: NEGATIVE
KETONES UR: NEGATIVE mg/dL
Nitrite: NEGATIVE
PROTEIN: NEGATIVE mg/dL
Specific Gravity, Urine: 1.018 (ref 1.005–1.030)
pH: 5 (ref 5.0–8.0)

## 2018-01-30 LAB — CBC
HEMATOCRIT: 39.8 % (ref 36.0–46.0)
Hemoglobin: 12.7 g/dL (ref 12.0–15.0)
MCH: 28.5 pg (ref 26.0–34.0)
MCHC: 31.9 g/dL (ref 30.0–36.0)
MCV: 89.2 fL (ref 78.0–100.0)
PLATELETS: 379 10*3/uL (ref 150–400)
RBC: 4.46 MIL/uL (ref 3.87–5.11)
RDW: 14.6 % (ref 11.5–15.5)
WBC: 6.7 10*3/uL (ref 4.0–10.5)

## 2018-01-30 LAB — COMPREHENSIVE METABOLIC PANEL
ALK PHOS: 338 U/L — AB (ref 38–126)
ALT: 16 U/L (ref 0–44)
AST: 27 U/L (ref 15–41)
Albumin: 4.1 g/dL (ref 3.5–5.0)
Anion gap: 10 (ref 5–15)
BILIRUBIN TOTAL: 0.8 mg/dL (ref 0.3–1.2)
BUN: 16 mg/dL (ref 8–23)
CALCIUM: 9.8 mg/dL (ref 8.9–10.3)
CHLORIDE: 106 mmol/L (ref 98–111)
CO2: 27 mmol/L (ref 22–32)
CREATININE: 0.78 mg/dL (ref 0.44–1.00)
Glucose, Bld: 110 mg/dL — ABNORMAL HIGH (ref 70–99)
Potassium: 4.7 mmol/L (ref 3.5–5.1)
Sodium: 143 mmol/L (ref 135–145)
TOTAL PROTEIN: 8.1 g/dL (ref 6.5–8.1)

## 2018-01-30 LAB — LACTATE DEHYDROGENASE: LDH: 275 U/L — ABNORMAL HIGH (ref 98–192)

## 2018-01-30 LAB — ABO/RH: ABO/RH(D): A POS

## 2018-01-30 NOTE — Anesthesia Preprocedure Evaluation (Addendum)
Anesthesia Evaluation  Patient identified by MRN, date of birth, ID band Patient awake    Reviewed: Allergy & Precautions, NPO status , Patient's Chart, lab work & pertinent test results  History of Anesthesia Complications (+) PONV and history of anesthetic complications  Airway Mallampati: II  TM Distance: >3 FB Neck ROM: Full    Dental no notable dental hx. (+) Dental Advisory Given, Caps   Pulmonary neg pulmonary ROS,    Pulmonary exam normal        Cardiovascular hypertension, negative cardio ROS Normal cardiovascular exam     Neuro/Psych negative neurological ROS     GI/Hepatic negative GI ROS, Neg liver ROS,   Endo/Other  Hypothyroidism   Renal/GU negative Renal ROS     Musculoskeletal negative musculoskeletal ROS (+)   Abdominal   Peds  Hematology negative hematology ROS (+)   Anesthesia Other Findings Day of surgery medications reviewed with the patient.  Reproductive/Obstetrics negative OB ROS                            Anesthesia Physical Anesthesia Plan  ASA: III  Anesthesia Plan: General   Post-op Pain Management:    Induction: Intravenous  PONV Risk Score and Plan: 4 or greater and Ondansetron, Dexamethasone and Diphenhydramine  Airway Management Planned: Oral ETT  Additional Equipment:   Intra-op Plan:   Post-operative Plan: Extubation in OR  Informed Consent: I have reviewed the patients History and Physical, chart, labs and discussed the procedure including the risks, benefits and alternatives for the proposed anesthesia with the patient or authorized representative who has indicated his/her understanding and acceptance.   Dental advisory given  Plan Discussed with: CRNA and Anesthesiologist  Anesthesia Plan Comments:        Anesthesia Quick Evaluation

## 2018-01-31 ENCOUNTER — Ambulatory Visit (HOSPITAL_COMMUNITY): Payer: Medicare Other | Admitting: Anesthesiology

## 2018-01-31 ENCOUNTER — Encounter (HOSPITAL_COMMUNITY)
Admission: RE | Disposition: A | Discharge status: Home / Self Care | Payer: Self-pay | Source: Other Acute Inpatient Hospital | Attending: Obstetrics

## 2018-01-31 ENCOUNTER — Ambulatory Visit (HOSPITAL_COMMUNITY)
Admission: RE | Admit: 2018-01-31 | Discharge: 2018-01-31 | Disposition: A | Discharge status: Home / Self Care | Payer: Medicare Other | Source: Other Acute Inpatient Hospital | Attending: Obstetrics | Admitting: Obstetrics

## 2018-01-31 ENCOUNTER — Other Ambulatory Visit: Payer: Self-pay | Admitting: Gynecologic Oncology

## 2018-01-31 ENCOUNTER — Encounter (HOSPITAL_COMMUNITY): Payer: Self-pay

## 2018-01-31 ENCOUNTER — Telehealth: Payer: Self-pay | Admitting: *Deleted

## 2018-01-31 ENCOUNTER — Inpatient Hospital Stay: Payer: Medicare Other

## 2018-01-31 DIAGNOSIS — R1909 Other intra-abdominal and pelvic swelling, mass and lump: Secondary | ICD-10-CM | POA: Diagnosis present

## 2018-01-31 DIAGNOSIS — Z791 Long term (current) use of non-steroidal anti-inflammatories (NSAID): Secondary | ICD-10-CM | POA: Diagnosis not present

## 2018-01-31 DIAGNOSIS — K660 Peritoneal adhesions (postprocedural) (postinfection): Secondary | ICD-10-CM | POA: Diagnosis not present

## 2018-01-31 DIAGNOSIS — M810 Age-related osteoporosis without current pathological fracture: Secondary | ICD-10-CM | POA: Insufficient documentation

## 2018-01-31 DIAGNOSIS — Z79890 Hormone replacement therapy (postmenopausal): Secondary | ICD-10-CM | POA: Diagnosis not present

## 2018-01-31 DIAGNOSIS — R1900 Intra-abdominal and pelvic swelling, mass and lump, unspecified site: Secondary | ICD-10-CM

## 2018-01-31 DIAGNOSIS — E039 Hypothyroidism, unspecified: Secondary | ICD-10-CM | POA: Diagnosis not present

## 2018-01-31 DIAGNOSIS — C562 Malignant neoplasm of left ovary: Secondary | ICD-10-CM

## 2018-01-31 DIAGNOSIS — I10 Essential (primary) hypertension: Secondary | ICD-10-CM | POA: Diagnosis not present

## 2018-01-31 DIAGNOSIS — Z5111 Encounter for antineoplastic chemotherapy: Secondary | ICD-10-CM | POA: Diagnosis not present

## 2018-01-31 DIAGNOSIS — Z79899 Other long term (current) drug therapy: Secondary | ICD-10-CM | POA: Diagnosis not present

## 2018-01-31 DIAGNOSIS — C481 Malignant neoplasm of specified parts of peritoneum: Secondary | ICD-10-CM | POA: Diagnosis not present

## 2018-01-31 DIAGNOSIS — C786 Secondary malignant neoplasm of retroperitoneum and peritoneum: Secondary | ICD-10-CM | POA: Diagnosis not present

## 2018-01-31 DIAGNOSIS — R971 Elevated cancer antigen 125 [CA 125]: Secondary | ICD-10-CM | POA: Diagnosis not present

## 2018-01-31 DIAGNOSIS — C569 Malignant neoplasm of unspecified ovary: Secondary | ICD-10-CM | POA: Diagnosis not present

## 2018-01-31 HISTORY — PX: LAPAROSCOPY: SHX197

## 2018-01-31 LAB — TYPE AND SCREEN
ABO/RH(D): A POS
ANTIBODY SCREEN: NEGATIVE

## 2018-01-31 SURGERY — LAPAROSCOPY, DIAGNOSTIC
Anesthesia: General | Site: Abdomen

## 2018-01-31 MED ORDER — FENTANYL CITRATE (PF) 100 MCG/2ML IJ SOLN
INTRAMUSCULAR | Status: AC
  Filled 2018-01-31: qty 2

## 2018-01-31 MED ORDER — PROPOFOL 10 MG/ML IV BOLUS
INTRAVENOUS | Status: DC | PRN
  Administered 2018-01-31: 130 mg via INTRAVENOUS

## 2018-01-31 MED ORDER — 0.9 % SODIUM CHLORIDE (POUR BTL) OPTIME
TOPICAL | Status: DC | PRN
  Administered 2018-01-31: 1000 mL

## 2018-01-31 MED ORDER — DEXAMETHASONE SODIUM PHOSPHATE 10 MG/ML IJ SOLN
INTRAMUSCULAR | Status: DC | PRN
  Administered 2018-01-31: 10 mg via INTRAVENOUS

## 2018-01-31 MED ORDER — ROCURONIUM BROMIDE 10 MG/ML (PF) SYRINGE
PREFILLED_SYRINGE | INTRAVENOUS | Status: DC | PRN
  Administered 2018-01-31: 10 mg via INTRAVENOUS
  Administered 2018-01-31: 50 mg via INTRAVENOUS

## 2018-01-31 MED ORDER — ROCURONIUM BROMIDE 10 MG/ML (PF) SYRINGE
PREFILLED_SYRINGE | INTRAVENOUS | Status: AC
  Filled 2018-01-31: qty 10

## 2018-01-31 MED ORDER — SUGAMMADEX SODIUM 200 MG/2ML IV SOLN
INTRAVENOUS | Status: AC
  Filled 2018-01-31: qty 2

## 2018-01-31 MED ORDER — ONDANSETRON HCL 4 MG/2ML IJ SOLN
INTRAMUSCULAR | Status: DC | PRN
  Administered 2018-01-31: 4 mg via INTRAVENOUS

## 2018-01-31 MED ORDER — LORATADINE 10 MG PO TABS
10.0000 mg | ORAL_TABLET | Freq: Every day | ORAL | Status: AC
  Administered 2018-01-31: 10 mg via ORAL
  Filled 2018-01-31 (×2): qty 1

## 2018-01-31 MED ORDER — PROPOFOL 10 MG/ML IV BOLUS
INTRAVENOUS | Status: AC
Start: 2018-01-31 — End: ?
  Filled 2018-01-31: qty 20

## 2018-01-31 MED ORDER — FENTANYL CITRATE (PF) 100 MCG/2ML IJ SOLN
INTRAMUSCULAR | Status: DC | PRN
  Administered 2018-01-31 (×3): 50 ug via INTRAVENOUS

## 2018-01-31 MED ORDER — LIDOCAINE 2% (20 MG/ML) 5 ML SYRINGE
INTRAMUSCULAR | Status: DC | PRN
  Administered 2018-01-31: 60 mg via INTRAVENOUS

## 2018-01-31 MED ORDER — BUPIVACAINE-EPINEPHRINE (PF) 0.25% -1:200000 IJ SOLN
INTRAMUSCULAR | Status: AC
  Filled 2018-01-31: qty 30

## 2018-01-31 MED ORDER — LIDOCAINE 2% (20 MG/ML) 5 ML SYRINGE
INTRAMUSCULAR | Status: AC
  Filled 2018-01-31: qty 5

## 2018-01-31 MED ORDER — LACTATED RINGERS IV SOLN
INTRAVENOUS | Status: DC
  Administered 2018-01-31: 1000 mL via INTRAVENOUS

## 2018-01-31 MED ORDER — PROMETHAZINE HCL 25 MG/ML IJ SOLN: 6.2500 mg | INTRAMUSCULAR | Status: DC | PRN

## 2018-01-31 MED ORDER — FENTANYL CITRATE (PF) 100 MCG/2ML IJ SOLN: 25.0000 ug | INTRAMUSCULAR | Status: DC | PRN

## 2018-01-31 MED ORDER — DIPHENHYDRAMINE HCL 50 MG/ML IJ SOLN
INTRAMUSCULAR | Status: DC | PRN
  Administered 2018-01-31: 12.5 mg via INTRAVENOUS

## 2018-01-31 MED ORDER — CEFAZOLIN SODIUM-DEXTROSE 2-4 GM/100ML-% IV SOLN
2.0000 g | INTRAVENOUS | Status: AC
  Administered 2018-01-31: 2 g via INTRAVENOUS
  Filled 2018-01-31: qty 100

## 2018-01-31 MED ORDER — SUGAMMADEX SODIUM 200 MG/2ML IV SOLN
INTRAVENOUS | Status: DC | PRN
  Administered 2018-01-31: 200 mg via INTRAVENOUS

## 2018-01-31 MED ORDER — LABETALOL HCL 5 MG/ML IV SOLN
INTRAVENOUS | Status: DC | PRN
  Administered 2018-01-31 (×4): 5 mg via INTRAVENOUS

## 2018-01-31 MED ORDER — LABETALOL HCL 5 MG/ML IV SOLN
INTRAVENOUS | Status: AC
  Filled 2018-01-31: qty 4

## 2018-01-31 SURGICAL SUPPLY — 41 items
ADH SKN CLS APL DERMABOND .7 (GAUZE/BANDAGES/DRESSINGS) ×1
BAG SPEC RTRVL LRG 6X4 10 (ENDOMECHANICALS)
CABLE HIGH FREQUENCY MONO STRZ (ELECTRODE) IMPLANT
CHLORAPREP W/TINT 26ML (MISCELLANEOUS) ×3 IMPLANT
CONT SPEC 4OZ CLIKSEAL STRL BL (MISCELLANEOUS) IMPLANT
COVER SURGICAL LIGHT HANDLE (MISCELLANEOUS) ×3 IMPLANT
DECANTER SPIKE VIAL GLASS SM (MISCELLANEOUS) IMPLANT
DERMABOND ADVANCED (GAUZE/BANDAGES/DRESSINGS) ×2
DERMABOND ADVANCED .7 DNX12 (GAUZE/BANDAGES/DRESSINGS) ×1 IMPLANT
DRAPE UNDERBUTTOCKS STRL (DRAPE) ×2 IMPLANT
ELECT REM PT RETURN 15FT ADLT (MISCELLANEOUS) ×3 IMPLANT
GLOVE BIO SURGEON STRL SZ 6 (GLOVE) ×6 IMPLANT
GLOVE BIO SURGEON STRL SZ 6.5 (GLOVE) ×4 IMPLANT
GLOVE BIO SURGEONS STRL SZ 6.5 (GLOVE) ×2
GOWN STRL REUS W/ TWL LRG LVL3 (GOWN DISPOSABLE) ×2 IMPLANT
GOWN STRL REUS W/TWL LRG LVL3 (GOWN DISPOSABLE) ×6
HOLDER FOLEY CATH W/STRAP (MISCELLANEOUS) IMPLANT
IRRIG SUCT STRYKERFLOW 2 WTIP (MISCELLANEOUS)
IRRIGATION SUCT STRKRFLW 2 WTP (MISCELLANEOUS) IMPLANT
KIT BASIN OR (CUSTOM PROCEDURE TRAY) ×3 IMPLANT
MANIPULATOR UTERINE 4.5 ZUMI (MISCELLANEOUS) IMPLANT
PAD POSITIONING PINK XL (MISCELLANEOUS) ×3 IMPLANT
POUCH SPECIMEN RETRIEVAL 10MM (ENDOMECHANICALS) IMPLANT
SCISSORS LAP 5X35 DISP (ENDOMECHANICALS) IMPLANT
SEALER TISSUE G2 CVD JAW 45CM (ENDOMECHANICALS) IMPLANT
SEALER TISSUE G2 STRG ARTC 35C (ENDOMECHANICALS) ×2 IMPLANT
SHEET LAVH (DRAPES) ×3 IMPLANT
SLEEVE XCEL OPT CAN 5 100 (ENDOMECHANICALS) ×1 IMPLANT
SUT MNCRL AB 4-0 PS2 18 (SUTURE) ×4 IMPLANT
SUT VICRYL 0 UR6 27IN ABS (SUTURE) ×4 IMPLANT
SUT VICRYL 4-0 PS2 18IN ABS (SUTURE) ×2 IMPLANT
SYS RETRIEVAL 5MM INZII UNIV (BASKET) ×3
SYSTEM RETRIEVL 5MM INZII UNIV (BASKET) IMPLANT
TOWEL OR 17X26 10 PK STRL BLUE (TOWEL DISPOSABLE) ×3 IMPLANT
TOWEL OR NON WOVEN STRL DISP B (DISPOSABLE) ×3 IMPLANT
TRAY FOLEY MTR SLVR 16FR STAT (SET/KITS/TRAYS/PACK) ×3 IMPLANT
TRAY LAPAROSCOPIC (CUSTOM PROCEDURE TRAY) ×3 IMPLANT
TROCAR BALLN 12MMX100 BLUNT (TROCAR) ×2 IMPLANT
TROCAR BLADELESS OPT 5 100 (ENDOMECHANICALS) ×3 IMPLANT
TROCAR XCEL 12X100 BLDLESS (ENDOMECHANICALS) IMPLANT
TROCAR XCEL BLUNT TIP 100MML (ENDOMECHANICALS) IMPLANT

## 2018-01-31 NOTE — Telephone Encounter (Signed)
The patient's daughter called regarding obtaining the path results from today. After the conversation the patient's daughter stated that "I will just plan to be at her house all day on Monday." Explained to the daughter that I will hold a spot for the patient in case they need the appt.

## 2018-01-31 NOTE — Anesthesia Procedure Notes (Signed)
Procedure Name: Intubation Date/Time: 01/31/2018 9:45 AM Performed by: [place] Zelina Jimerson C, CRNA Pre-anesthesia Checklist: Patient identified, Emergency Drugs available, Suction available and Patient being monitored Patient Re-evaluated:Patient Re-evaluated prior to induction Oxygen Delivery Method: Circle system utilized Preoxygenation: Pre-oxygenation with 100% oxygen Induction Type: IV induction Ventilation: Mask ventilation without difficulty Laryngoscope Size: Mac and 3 Tube type: Oral Tube size: 7.0 mm Number of attempts: 1 Airway Equipment and Method: Stylet and Oral airway Placement Confirmation: ETT inserted through vocal cords under direct vision,  positive ETCO2 and breath sounds checked- equal and bilateral Tube secured with: Tape Dental Injury: Teeth and Oropharynx as per pre-operative assessment

## 2018-01-31 NOTE — Op Note (Signed)
OPERATIVE REPORT  01/31/18   Pre-operative Diagnosis: Ovarian cancer NOS  Post-operative Diagnosis: At least Stage 3 ovarian cancer   Operation: Laparoscopic omental biopsy  Surgeon: Mart Piggs, MD  Assistants: none  Anesthesia: General endotracheal anesthesia  Estimated Blood Loss:  less than 50 mL         Specimens: Omentum  Operative Findings: Omental nodule >2cm. Large left pelvic mass 12-15cm with sigmoid draped medially and potentially adherent to colon. The mass appeared to have a 3cm cystic area that was ruptured preoperatively. Uterus appears normal with small anterior fibroid ~1cm. Right adnexa nonenlarged. Small bowel without evidence of disease. No diaphragmatic disease.  Procedure Details  Patient was placed in lithotomy position with arms tucked. After induction of anesthesia, the patient was placed in dorsolithotomy position and prepped then draped in the usual sterile manner. Time out was performed.  Foley catheter was placed by me. A sterile speculum was placed in the vagina.  The cervix was grasped with a single-tooth tenaculum however the os could not be cannulated to accommodate a manipulator. Therefore a sponge on a stick was placed into the vagina.   0.5 percent marcaine was injected into the planned entry site at the superior umbilical margin. Towel clamps were placed. A 10 mm skin incision was made. Hassan technique used to enter the abdomen. The abdomen was insufflated and the findings were noted as above.   At this point and all points during the procedure, the patient's intra-abdominal pressure did not exceed 15 mmHg. The patient was placed in steep trendelenberg to visualize pelvic anatomy. Next, a 5 mm skin incision was made in the right and left lower quadrants and under direct visualization the trocars inserted. Diagnostic laparoscopy and imaging was performed. The omentum was elevated and the 10mm Enseal used to obtain ~3x2cm sample to send for  pathology. Hemostasis was noted. The specimen was placed into a 49mm Endocatch bag. The bag was delivered through the 96-78LF supraumbilical port site and sent to pathology to confirm malignancy present and that no additional tissue needed to be removed.  After pathology confirmed malignancy present the 5 mm trocars were removed under visualization with no bleeding. The 10-12 mm trocar was then removed and the stay sutures used for the trocar were used to reapproximate the fascia.   The incisions were closed in the dermis with interrupted 4-0 vicryl and then 4-0 monocryl used to close in a subcuticular running fashion. Dermabond was then applied.  Counts were correct x 2.          Complications:  None; patient tolerated the procedure well.         Disposition: PACU - hemodynamically stable.

## 2018-01-31 NOTE — Interval H&P Note (Signed)
History and Physical Interval Note:  01/31/2018 8:59 AM  Shannon Ellis  has presented today for surgery, with the diagnosis of PELVIC MASS  The various methods of treatment have been discussed with the patient and family. After consideration of risks, benefits and other options for treatment, the patient has consented to  Procedure(s): LAPAROSCOPY DIAGNOSTIC WITH BIOPSY (N/A) as a surgical intervention .  The patient's history has been reviewed, patient examined, no change in status, stable for surgery.  I have reviewed the patient's chart and labs.  Questions were answered to the patient's satisfaction.     [name]

## 2018-01-31 NOTE — Anesthesia Postprocedure Evaluation (Signed)
Anesthesia Post Note  Patient: Shannon Ellis  Procedure(s) Performed: LAPAROSCOPY DIAGNOSTIC WITH BIOPSY (N/A Abdomen)     Patient location during evaluation: PACU Anesthesia Type: General Level of consciousness: awake Pain management: pain level controlled Vital Signs Assessment: post-procedure vital signs reviewed and stable Respiratory status: spontaneous breathing Cardiovascular status: stable Postop Assessment: no apparent nausea or vomiting Anesthetic complications: no    Last Vitals:  Vitals:   01/31/18 1150 01/31/18 1200  BP: (!) 151/83 (!) 152/85  Pulse: 74 64  Resp: 16 17  Temp: 36.9 C   SpO2: 100% 100%    Last Pain:  Vitals:   01/31/18 1215  TempSrc:   PainSc: 0-No pain   Pain Goal: Patients Stated Pain Goal: 4 (01/31/18 0739)               Stephanieann Popescu JR,JOHN [place]

## 2018-01-31 NOTE — Transfer of Care (Signed)
Immediate Anesthesia Transfer of Care Note  Patient: Shannon Ellis  Procedure(s) Performed: LAPAROSCOPY DIAGNOSTIC WITH BIOPSY (N/A Abdomen)  Patient Location: PACU  Anesthesia Type:General  Level of Consciousness: awake, alert  and oriented  Airway & Oxygen Therapy: Patient Spontanous Breathing and Patient connected to face mask oxygen  Post-op Assessment: Report given to RN and Post -op Vital signs reviewed and stable  Post vital signs: Reviewed and stable  Last Vitals:  Vitals Value Taken Time  BP 151/83 01/31/2018 11:50 AM  Temp    Pulse 69 01/31/2018 11:55 AM  Resp 16 01/31/2018 11:55 AM  SpO2 100 % 01/31/2018 11:55 AM  Vitals shown include unvalidated device data.  Last Pain:  Vitals:   01/31/18 0739  TempSrc:   PainSc: 0-No pain      Patients Stated Pain Goal: 4 (88/91/69 4503)  Complications: No apparent anesthesia complications

## 2018-02-01 ENCOUNTER — Encounter (HOSPITAL_COMMUNITY): Payer: Self-pay | Admitting: Obstetrics

## 2018-02-04 ENCOUNTER — Telehealth: Payer: Self-pay | Admitting: Gynecologic Oncology

## 2018-02-04 NOTE — Telephone Encounter (Signed)
Called to update family on current situation.  Left message asking them to please call the office.

## 2018-02-04 NOTE — Telephone Encounter (Signed)
Called patient's cell listed with daughter [name] answering.  Advised that the pathologist felt the tissue was similar in appearance to her previous biopsy.  Advised the tissue has been sent to [facility] for second opinion.  Advised they would be contacted once additional information was received and that we are holding an appt spot on Friday with Dr. Gerarda Fraction in hopes that the path review will be available.  No concerns voiced.  She states she is going to her mother's house now and will let her know.  Unable to get through on home line due to busy signal.  Advised to call for any needs.

## 2018-02-05 ENCOUNTER — Telehealth: Payer: Self-pay | Admitting: *Deleted

## 2018-02-05 LAB — INHIBIN B: Inhibin B: <7 pg/mL (ref 0.0–16.9)

## 2018-02-05 NOTE — Telephone Encounter (Signed)
Patient's daughter [name] called regarding the patient's incision. She stated the following "My mom had a procedure last Thursday with biopsies taken. We noticed last night at dinner that the front of her shorts her wet. When we looked her incision from her belly button was leaking. It was clear/yellow drainage. It is worse this morning. It is pouring out. It is not red, warm to the touch, no odor. She is not having any fevers or chills. We changed the bandage this morning and have to change is again now, there is like a tablespoon of drainage on it. We can't see the sutures that Dr. Gerarda Fraction said she put, but we never really could. Some of the dermabond is gone." Asked the daughter to put either several gauge pads or a piece of washcloth on the incision with tape until her appt today at 10am.  Explained this would help [place] calculate the amount of drainage per hour. Notified Melissa APP.

## 2018-02-06 ENCOUNTER — Telehealth: Payer: Self-pay

## 2018-02-06 LAB — INHIBIN A: Inhibin-A: 0.7 pg/mL

## 2018-02-06 NOTE — Telephone Encounter (Signed)
Dtr. Edison Simon that her mother woke up this am in a cold sweat and feeling nauseous.  Pt got up and drank fluids.  Afebrile.  Pt thought it might be a hot flash. No other  Episode since this am. Pt had been eating well until today.  Pt moved bowels today. Stool loose.  Pt used senokot. No abdominal pain.  She just does not feel good today. Reviewed with Melissa Cross,NP. Will have pt take Miralax 1 capful bid as she may be constipated and oozing stool around "plug" She needs to drink at least 64 oz of fluid in 24 hours. Try  liquids for the next 24 hrs. If she develops severe abdominal pain with vomiting she needs to go to the ED to be evaluated.   Daughter states that the drainage from the umbilicus incision has significantly decreased since yesterday.  The drainage is clear fluid. Keep follow up as scheduled for 02-08-18 as scheduled.

## 2018-02-07 ENCOUNTER — Telehealth: Payer: Self-pay | Admitting: Gynecologic Oncology

## 2018-02-07 NOTE — Telephone Encounter (Signed)
Returned call to patient.  She states she had no drainage from the incision at the umbilicus this am but when she started to get up and move around the noticed a moderate amount of "yellowish" drainage.  No fever, chills, or erythema reported.  States her bowels are moving and she is tolerating her diet with no nausea or emesis and she has a good appetite.  Advised to continue placing the dressings over the incision and wear the binder as tolerated for extra support.  She is to follow up in the office tomorrow with Dr. Gerarda Fraction and advised to call for any needs in between that time. No other concerns voiced.

## 2018-02-08 ENCOUNTER — Telehealth: Payer: Self-pay | Admitting: Hematology and Oncology

## 2018-02-08 ENCOUNTER — Inpatient Hospital Stay (HOSPITAL_BASED_OUTPATIENT_CLINIC_OR_DEPARTMENT_OTHER): Payer: Medicare Other | Admitting: Obstetrics

## 2018-02-08 ENCOUNTER — Other Ambulatory Visit: Payer: Self-pay | Admitting: Student

## 2018-02-08 ENCOUNTER — Encounter: Payer: Self-pay | Admitting: Oncology

## 2018-02-08 ENCOUNTER — Encounter (HOSPITAL_COMMUNITY): Payer: Self-pay

## 2018-02-08 ENCOUNTER — Encounter: Payer: Self-pay | Admitting: Obstetrics

## 2018-02-08 ENCOUNTER — Other Ambulatory Visit: Payer: Self-pay | Admitting: Hematology and Oncology

## 2018-02-08 VITALS — BP 159/72 | HR 64 | Temp 97.6°F | Resp 18 | Ht 66.0 in | Wt 168.8 lb

## 2018-02-08 DIAGNOSIS — C562 Malignant neoplasm of left ovary: Secondary | ICD-10-CM

## 2018-02-08 DIAGNOSIS — Z79899 Other long term (current) drug therapy: Secondary | ICD-10-CM | POA: Diagnosis not present

## 2018-02-08 DIAGNOSIS — C569 Malignant neoplasm of unspecified ovary: Secondary | ICD-10-CM

## 2018-02-08 DIAGNOSIS — Z5111 Encounter for antineoplastic chemotherapy: Secondary | ICD-10-CM | POA: Diagnosis not present

## 2018-02-08 DIAGNOSIS — R971 Elevated cancer antigen 125 [CA 125]: Secondary | ICD-10-CM | POA: Diagnosis not present

## 2018-02-08 DIAGNOSIS — C786 Secondary malignant neoplasm of retroperitoneum and peritoneum: Secondary | ICD-10-CM | POA: Diagnosis not present

## 2018-02-08 NOTE — Progress Notes (Signed)
[facility]   Progress Note: Established Patient FIRST VISIT  Consult was originally requested by Dr. Marton Redwood for a palpable mass that on workup was found to be c/w an adnexal mass with associated carcinomatosis and periaortic adenopathy (1.14x2.17cm)  Chief Complaint  Patient presents with  . Malignant neoplasm of ovary, unspecified laterality [facility])    HPI: [name]  is an 84 y.o.  P4   Interval History Biopsy of the omentum. That returned 01/21/2018 as poorly differentiated carcinoma with stains +PAX8, WT1, ER/PR, CK7, and +AE1/AE3, PLAP. Negative for multiple other markers with a read the morphology and immunophenotype are suggestive of a germ cell tumor.  Given the unusual histology Dr. Alvy Bimler is concerned with upfront neoadjuvant chemotherapy.   Recall  CA125 is 1492 and she has carcinomatosis/omental cake and a 2.1x1.1 cm paraaortic node.  I took her 01/31/18 and performed laparoscopic biopsies of the omentum. This has been reviewed by [facility] and returned with likely EOC. Hepatoid features may be present and additional stains could be done to add to final diagnosis.   Presenting History She was seen by her PCP, Dr. Brigitte Pulse, for an annual exam and on palpation of the abdomen a mass was noted. Workup included a CTScan AP 01/02/18 revealing a large complex pelvic mass, carcinomatosis with omental nodularity, and associated left periaortic adenopathy (measured by me at 1.14 x 2.17cm). No chest imaging.  01/03/18 CA125 was obtained and equal to 1492. She was therefore referred for recommendations.  She has noted some fatigue and apathy. No pain. She discusses some discomfort that she first noted 3 years ago in her LLQ but the family states it was elsewhere that she was having pain then. Denies bleeding, vaginal dc. She has had about 3-4 months of early satiety but no weight loss, no reflux. Some slight  constipation but no big change in bowel movements. Last colonoscopy was 2-3 years ago.    Imported EPIC Oncologic History: TBD   Ovarian cancer ([place])   01/02/2018 Imaging    1. Large complex left paramidline pelvic mass likely rises from the left ovary. Together with left periaortic adenopathy and peritoneal carcinomatosis, findings are worrisome for metastatic ovarian cancer. 2. Small pelvic free fluid. 3.  Aortic atherosclerosis (ICD10-170.0).    01/21/2018 Pathology Results    Omentum, biopsy, dominant omental nodule within the right lower abdominal quadrant - POORLY DIFFERENTIATED CARCINOMA CONSISTENT WITH GYNECOLOGIC PRIMARY. - SEE MICROSCOPIC DESCRIPTION. Microscopic Comment The tumor is characterized by sheets of tumor cells with enlarged nuclei, many containing prominent nucleoli. There are patchy areas with eosinophilic cytoplasmic inclusions. Immunohistochemistry shows positivity with cytokeratin AE1/AE3, cytokeratin 7, estrogen receptor, progesterone receptor, PAX-8, WT-1, and placental alkaline phosphatase (PLAP). The tumor is negative with alpha fetoprotein, human chorionic gonadotropin, CD117, CD10, CD30, CD56, CDX-2, CEA, cytokeratin 20, S100, SOX-10, synaptophysin, chromogranin, D2-40, GATA-3, GCDFP, Glypican 3 and Inhibin. 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 The estrogen receptor is 100% with strong staining and the progesterone receptor is 90% with strong staining.    01/21/2018 Procedure    Technically successful CT guided core needle biopsy of dominant omental nodule within the right lower abdominal quadrant, adjacent to the cecum.    01/24/2018 Cancer Staging    Staging form: Ovary, Fallopian Tube, and Primary Peritoneal Carcinoma, AJCC 8th Edition - Clinical: Stage IIIC (cT3c, cN1b, cM0) - Signed by [name], MD on 01/24/2018  01/31/2018 Surgery    Pre-operative Diagnosis: Ovarian cancer NOS  Post-operative  Diagnosis: At least Stage 3 ovarian cancer   Operation: Laparoscopic omental biopsy  Surgeon: Mart Piggs, MD  Specimens: Omentum  Operative Findings: Omental nodule >2cm. Large left pelvic mass 12-15cm with sigmoid draped medially and potentially adherent to colon. The mass appeared to have a 3cm cystic area that was ruptured preoperatively. Uterus appears normal with small anterior fibroid ~1cm. Right adnexa nonenlarged. Small bowel without evidence of disease. No diaphragmatic disease.     02/14/2018 -  Chemotherapy    The patient had PALONOSETRON HCL INJECTION 0.25 MG/5ML, 0.25 mg, Intravenous,  Once, 0 of 6 cycles pegfilgrastim-cbqv (UDENYCA) injection 6 mg, 6 mg, Subcutaneous, Once, 0 of 6 cycles CARBOplatin (PARAPLATIN) in sodium chloride 0.9 % 100 mL chemo infusion, , Intravenous,  Once, 0 of 6 cycles PACLitaxel (TAXOL) 330 mg in sodium chloride 0.9 % 500 mL chemo infusion (> 43m/m2), 175 mg/m2, Intravenous,  Once, 0 of 6 cycles FOSAPREPITANT 150MG + DEXAMETHASONE INFUSION [facility], , Intravenous,  Once, 0 of 6 cycles  for chemotherapy treatment.      ECOG PERFORMANCE STATUS: 0 - Asymptomatic  Measurement of disease:  CA125 . 01/04/18 1492 (preop)   Radiology: Dg Chest 2 View  Result Date: 01/30/2018 CLINICAL DATA:  Preoperative chest radiograph prior to pelvic surgery. EXAM: CHEST - 2 VIEW COMPARISON:  06/07/2013 and prior radiographs FINDINGS: Mild cardiomegaly and chronic peribronchial thickening again noted. There is no evidence of focal airspace disease, pulmonary edema, suspicious pulmonary nodule/mass, pleural effusion, or pneumothorax. No acute bony abnormalities are identified. IMPRESSION: No evidence of acute cardiopulmonary disease. Mild cardiomegaly and chronic peribronchial thickening. Electronically Signed   By: JMargarette CanadaM.D.   On: 01/30/2018 14:06   Ct Biopsy  Result Date: 01/21/2018 INDICATION: No known primary, now with pelvic mass and omental  nodules worrisome for metastatic disease. Please perform CT-guided biopsy for tissue diagnostic purposes. EXAM: CT-GUIDED BIOPSY OF OMENTAL NODULE COMPARISON:  CT abdomen pelvis - 01/02/2018 MEDICATIONS: None. ANESTHESIA/SEDATION: Fentanyl 50 mcg IV; Versed 1 mg IV Sedation time: 10 minutes; The patient was continuously monitored during the procedure by the interventional radiology nurse under my direct supervision. CONTRAST:  None. COMPLICATIONS: None immediate. PROCEDURE: Informed consent was obtained from the patient following an explanation of the procedure, risks, benefits and alternatives. A time out was performed prior to the initiation of the procedure. The patient was positioned supine on the CT table and a limited CT was performed for procedural planning demonstrating unchanged appearance of the dominant approximately 2.2 x 1.3 cm omental nodule within the right lower abdomen adjacent to the cecum (image 63, series 2). The procedure was planned. The operative site was prepped and draped in the usual sterile fashion. Appropriate trajectory was confirmed with a 22 gauge spinal needle after the adjacent tissues were anesthetized with 1% Lidocaine with epinephrine. Under intermittent CT guidance, a 17 gauge coaxial needle was advanced into the peripheral aspect of the mass. Appropriate positioning was confirmed and 5 core needle biopsy samples were obtained with an 18 gauge core needle biopsy device. The co-axial needle was removed and hemostasis was achieved with manual compression. A limited postprocedural CT was negative for hemorrhage or additional complication. A dressing was placed. The patient tolerated the procedure well without immediate postprocedural complication. IMPRESSION: Technically successful CT guided core needle biopsy of dominant omental nodule within the right lower abdominal quadrant, adjacent to the cecum. Electronically Signed   By: JJenny Reichmann  Watts M.D.   On: 01/21/2018 11:22    .   Outpatient Encounter Medications as of 02/08/2018  Medication Sig  . amLODipine (NORVASC) 5 MG tablet Take 5 mg by mouth daily at 6 PM.  . ibuprofen (ADVIL,MOTRIN) 200 MG tablet Take 200 mg by mouth daily as needed. For pain   . levothyroxine (SYNTHROID, LEVOTHROID) 88 MCG tablet Take 88 mcg by mouth daily before breakfast.  . loratadine (CLARITIN) 10 MG tablet Take 10 mg by mouth daily as needed for allergies (for congestion).   . metoprolol succinate (TOPROL-XL) 50 MG 24 hr tablet Take 50 mg by mouth daily. In the morning  . Multiple Vitamins-Minerals (MULTIVITAMINS THER. W/MINERALS) TABS Take 1 tablet by mouth daily.    Marland Kitchen olmesartan (BENICAR) 40 MG tablet Take 40 mg by mouth daily at 6 PM.    No facility-administered encounter medications on file as of 02/08/2018.    No Known Allergies  Past Medical History:  Diagnosis Date  . Allergy   . Anemia   . Blood transfusion without reported diagnosis   . Complication of anesthesia    waking up-does not take much medication  . Family history of adverse reaction to anesthesia    problems waking up as does not need much medication  . Hypertension   . Hypothyroidism   . Osteoporosis 10/07   Dexa scan  . PONV (postoperative nausea and vomiting)    Past Surgical History:  Procedure Laterality Date  . APPENDECTOMY  1950   not ruptured  . LAPAROSCOPY N/A 01/31/2018   Procedure: LAPAROSCOPY DIAGNOSTIC WITH BIOPSY;  Surgeon: [name], MD;  Location: [facility];  Service: Gynecology;  Laterality: N/A;  . THYROIDECTOMY    . TONSILLECTOMY    . TOTAL HIP ARTHROPLASTY Right       Past Gynecological History:   GYNECOLOGIC HISTORY:  No LMP recorded. Patient is postmenopausal. . Menarche: 84 years old . P 4 . LMP 84 yo . Contraceptive 2 years OCP . HRT None  . Last Pap 12/2017 negative but no TZone seen due to atrophy Family Hx:  Family History  Problem Relation Age of Onset  . Diabetes Mother   . Stroke Mother   . Breast  cancer Sister 41       breast ca  . Diabetes Sister   . Lung cancer Brother   . Stroke Brother    Social Hx:  Marland Kitchen Tobacco use: none . Alcohol use: none . Illicit Drug use: none . Illicit IV Drug use: none    Review of Systems: Review of Systems  All other systems reviewed and are negative. Wound leakage at umbilicus  Vitals: Blood pressure (!) 152/68, pulse 82, temperature (!) 97.5 F (36.4 C), temperature source Oral, resp. rate 20, height '5\' 6"'  (1.676 m), weight 166 lb 3.2 oz (75.4 kg), SpO2 97 %. Vitals:   02/08/18 1435  Weight: 168 lb 12.8 oz (76.6 kg)  Height: '5\' 6"'  (1.676 m)    Body mass index is 27.25 kg/m.   Physical Exam:   General :  Well developed, 84 y.o., female in no apparent distress HEENT:  Normocephalic/atraumatic, symmetric, EOMI, eyelids normal Neck:   No visible masses.  Respiratory:  Respirations unlabored, no use of accessory muscles CV:   Deferred Breast:  Deferred Musculoskeletal: Normal muscle strength. Abdomen:  Wound with soaked dressing. No erythema or areas of concern.  No visible masses or protrusion. No pain. Some serous fluid drainage midline of the incision. Extremities:  No visible edema or deformities Skin:   Normal inspection Neuro/Psych:  No focal motor deficit, no abnormal mental status. Normal gait. Normal affect. Alert and oriented to person, place, and time   from 01/10/18 exam Bimanual exam: palpable disease in cul[place], nodular, no breakthrough to vagina.  Uterus: Normal size, mobile.  Adnexa: palpable disease in culdesac deviating to patient's left.  Rectovaginal:  + disease palpable but no breakthrough into rectal mucsosa. Cannot separate from anterior rectal wall. Starting about 6-7cm from anal verge. Good tone, no parametrial involvement   Assessment  Pelvic mass with paraaortic adenopathy and omental disease  Plan  1. Data reviewed o We reviewed the pathology report from [facility] i. Defer additional  staining to pathology for now. 2. 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 i. We reviewed some of the more common side effects of this regimen today. o We have previously reviewed primary surgical management and the risks that in her case could include inability to resect all disease (left paraaortic lymph node near left renal vein) and possible colostomy / bowel reanastomosis with potential for leak. 3. Pending items o Chest imaging needs to be obtained at some point, I have ordered a CTChest; at least CXR 01/30/18 was negative 4. We are checking with Dr. Calton Dach team to get the appointments for chemo/port etc confirmed. 5. Wound drainage o Likely a bit of a fascial separation from pressure when she "retched". No more N/V and no pain. We will place steristrips and dressing with tegaderm which can be changed once daily. If still causing issues early next week I can place external cuticular sutures.  15 minutes of direct face to face counseling time was spent with the patient. This included discussion about prognosis, therapy recommendations including chemotherapy, and side effects of treatment that are beyond the scope of routine postoperative care.   [name], MD  02/08/2018, 3:23 PM   Cc: Marton Redwood, MD (Referring PCP)

## 2018-02-08 NOTE — Progress Notes (Signed)
START ON PATHWAY REGIMEN - Ovarian     A cycle is every 21 days:     Paclitaxel      Carboplatin   **Always confirm dose/schedule in your pharmacy ordering system**  Patient Characteristics: Preoperative or Nonsurgical Candidate (Clinical Staging), Newly Diagnosed, Neoadjuvant Therapy Therapeutic Status: Preoperative or Nonsurgical Candidate (Clinical Staging) BRCA Mutation Status: Did Not Order Test AJCC T Category: cT3 AJCC 8 Stage Grouping: Unknown AJCC N Category: cN1 AJCC M Category: cM0 Intent of Therapy: Curative Intent, Discussed with Patient

## 2018-02-08 NOTE — Telephone Encounter (Signed)
PT scheduled per 8/23 sch message. Pt will be notified by nurse.

## 2018-02-08 NOTE — Patient Instructions (Signed)
1. Followup with Dr. Alvy Bimler for chemo 2. Return here in one month to check on response and discuss ongoing plans for surgery

## 2018-02-08 NOTE — Progress Notes (Signed)
Patient and family were given calender and were advised of appointments for port placement on 02/11/18 with instructions for npo after midnight, needs a driver, am meds OK with sips of water.  Also went over appointment with Dr. Alvy Bimler, labs and chemo education class on 02/14/18 and 1st chemotherapy infusion on Friday, 02/15/18.  They verbalized understanding and agreement.

## 2018-02-09 ENCOUNTER — Other Ambulatory Visit: Payer: Self-pay | Admitting: Radiology

## 2018-02-11 ENCOUNTER — Ambulatory Visit (HOSPITAL_COMMUNITY)
Admission: RE | Admit: 2018-02-11 | Discharge: 2018-02-11 | Disposition: A | Discharge status: Home / Self Care | Payer: Medicare Other | Source: Ambulatory Visit | Attending: Hematology and Oncology | Admitting: Hematology and Oncology

## 2018-02-11 ENCOUNTER — Encounter (HOSPITAL_COMMUNITY): Payer: Self-pay

## 2018-02-11 DIAGNOSIS — Z801 Family history of malignant neoplasm of trachea, bronchus and lung: Secondary | ICD-10-CM | POA: Diagnosis not present

## 2018-02-11 DIAGNOSIS — M810 Age-related osteoporosis without current pathological fracture: Secondary | ICD-10-CM | POA: Diagnosis not present

## 2018-02-11 DIAGNOSIS — Z803 Family history of malignant neoplasm of breast: Secondary | ICD-10-CM | POA: Diagnosis not present

## 2018-02-11 DIAGNOSIS — Z823 Family history of stroke: Secondary | ICD-10-CM | POA: Diagnosis not present

## 2018-02-11 DIAGNOSIS — Z96641 Presence of right artificial hip joint: Secondary | ICD-10-CM | POA: Diagnosis not present

## 2018-02-11 DIAGNOSIS — C569 Malignant neoplasm of unspecified ovary: Secondary | ICD-10-CM | POA: Insufficient documentation

## 2018-02-11 DIAGNOSIS — Z79899 Other long term (current) drug therapy: Secondary | ICD-10-CM | POA: Diagnosis not present

## 2018-02-11 DIAGNOSIS — D649 Anemia, unspecified: Secondary | ICD-10-CM | POA: Insufficient documentation

## 2018-02-11 DIAGNOSIS — Z79890 Hormone replacement therapy (postmenopausal): Secondary | ICD-10-CM | POA: Diagnosis not present

## 2018-02-11 DIAGNOSIS — C562 Malignant neoplasm of left ovary: Secondary | ICD-10-CM

## 2018-02-11 DIAGNOSIS — Z9889 Other specified postprocedural states: Secondary | ICD-10-CM | POA: Insufficient documentation

## 2018-02-11 DIAGNOSIS — I10 Essential (primary) hypertension: Secondary | ICD-10-CM | POA: Diagnosis not present

## 2018-02-11 DIAGNOSIS — E039 Hypothyroidism, unspecified: Secondary | ICD-10-CM | POA: Insufficient documentation

## 2018-02-11 DIAGNOSIS — Z5111 Encounter for antineoplastic chemotherapy: Secondary | ICD-10-CM | POA: Diagnosis not present

## 2018-02-11 DIAGNOSIS — Z452 Encounter for adjustment and management of vascular access device: Secondary | ICD-10-CM | POA: Diagnosis not present

## 2018-02-11 HISTORY — PX: IR IMAGING GUIDED PORT INSERTION: IMG5740

## 2018-02-11 LAB — CBC
HCT: 39.6 % (ref 36.0–46.0)
HEMOGLOBIN: 12.6 g/dL (ref 12.0–15.0)
MCH: 28.3 pg (ref 26.0–34.0)
MCHC: 31.8 g/dL (ref 30.0–36.0)
MCV: 89 fL (ref 78.0–100.0)
Platelets: 370 10*3/uL (ref 150–400)
RBC: 4.45 MIL/uL (ref 3.87–5.11)
RDW: 14.3 % (ref 11.5–15.5)
WBC: 6.9 10*3/uL (ref 4.0–10.5)

## 2018-02-11 LAB — PROTIME-INR
INR: 1.09
Prothrombin Time: 14.1 seconds (ref 11.4–15.2)

## 2018-02-11 LAB — APTT: APTT: 27 s (ref 24–36)

## 2018-02-11 MED ORDER — LIDOCAINE-EPINEPHRINE (PF) 1 %-1:200000 IJ SOLN
INTRAMUSCULAR | Status: AC
  Filled 2018-02-11: qty 30

## 2018-02-11 MED ORDER — FENTANYL CITRATE (PF) 100 MCG/2ML IJ SOLN
INTRAMUSCULAR | Status: AC | PRN
  Administered 2018-02-11: 50 ug via INTRAVENOUS
  Administered 2018-02-11: 25 ug via INTRAVENOUS

## 2018-02-11 MED ORDER — HEPARIN SOD (PORK) LOCK FLUSH 100 UNIT/ML IV SOLN
INTRAVENOUS | Status: AC
  Administered 2018-02-11: 10:00:00
  Filled 2018-02-11: qty 5

## 2018-02-11 MED ORDER — MIDAZOLAM HCL 2 MG/2ML IJ SOLN
INTRAMUSCULAR | Status: AC | PRN
  Administered 2018-02-11: 1 mg via INTRAVENOUS
  Administered 2018-02-11: 0.5 mg via INTRAVENOUS

## 2018-02-11 MED ORDER — LIDOCAINE HCL 1 % IJ SOLN
INTRAMUSCULAR | Status: AC
  Filled 2018-02-11: qty 20

## 2018-02-11 MED ORDER — SODIUM CHLORIDE 0.9 % IV SOLN
INTRAVENOUS | Status: DC
  Administered 2018-02-11: 09:00:00 via INTRAVENOUS

## 2018-02-11 MED ORDER — CEFAZOLIN SODIUM-DEXTROSE 2-4 GM/100ML-% IV SOLN
2.0000 g | INTRAVENOUS | Status: AC
  Administered 2018-02-11: 2 g via INTRAVENOUS

## 2018-02-11 MED ORDER — LIDOCAINE HCL (PF) 1 % IJ SOLN
INTRAMUSCULAR | Status: AC | PRN
  Administered 2018-02-11: 15 mL

## 2018-02-11 MED ORDER — CEFAZOLIN SODIUM-DEXTROSE 2-4 GM/100ML-% IV SOLN
INTRAVENOUS | Status: AC
  Filled 2018-02-11: qty 100

## 2018-02-11 MED ORDER — MIDAZOLAM HCL 2 MG/2ML IJ SOLN
INTRAMUSCULAR | Status: AC
  Filled 2018-02-11: qty 4

## 2018-02-11 MED ORDER — CEFAZOLIN SODIUM-DEXTROSE 1-4 GM/50ML-% IV SOLN: 1.0000 g | Freq: Once | INTRAVENOUS | Status: DC

## 2018-02-11 MED ORDER — FENTANYL CITRATE (PF) 100 MCG/2ML IJ SOLN
INTRAMUSCULAR | Status: AC
  Filled 2018-02-11: qty 4

## 2018-02-11 NOTE — Consult Note (Signed)
Interventional Radiology Procedure Note  Procedure: Placement of a right IJ approach single lumen PowerPort.  Tip is positioned at the superior cavoatrial junction and catheter is ready for immediate use.  Complications: None Recommendations:  - Ok to shower tomorrow - Do not submerge for 7 days - Routine line care   Signed,  Latissa Frick S. Paytan Recine, DO   

## 2018-02-11 NOTE — Discharge Instructions (Addendum)
Implanted Port Insertion, Care After °This sheet gives you information about how to care for yourself after your procedure. Your health care provider may also give you more specific instructions. If you have problems or questions, contact your health care provider. °What can I expect after the procedure? °After your procedure, it is common to have: °· Discomfort at the port insertion site. °· Bruising on the skin over the port. This should improve over 3-4 days. ° °Follow these instructions at home: °Port care °· After your port is placed, you will get a manufacturer's information card. The card has information about your port. Keep this card with you at all times. °· Take care of the port as told by your health care provider. Ask your health care provider if you or a family member can get training for taking care of the port at home. A home health care nurse may also take care of the port. °· Make sure to remember what type of port you have. °Incision care °· Follow instructions from your health care provider about how to take care of your port insertion site. Make sure you: °? Wash your hands with soap and water before you change your bandage (dressing). If soap and water are not available, use hand sanitizer. °? Change your dressing as told by your health care provider. °? Leave stitches (sutures), skin glue, or adhesive strips in place. These skin closures may need to stay in place for 2 weeks or longer. If adhesive strip edges start to loosen and curl up, you may trim the loose edges. Do not remove adhesive strips completely unless your health care provider tells you to do that. °· Check your port insertion site every day for signs of infection. Check for: °? More redness, swelling, or pain. °? More fluid or blood. °? Warmth. °? Pus or a bad smell. °General instructions °· Do not take baths, swim, or use a hot tub until your health care provider approves. °· Do not lift anything that is heavier than 10 lb (4.5  kg) for a week, or as told by your health care provider. °· Ask your health care provider when it is okay to: °? Return to work or school. °? Resume usual physical activities or sports. °· Do not drive for 24 hours if you were given a medicine to help you relax (sedative). °· Take over-the-counter and prescription medicines only as told by your health care provider. °· Wear a medical alert bracelet in case of an emergency. This will tell any health care providers that you have a port. °· Keep all follow-up visits as told by your health care provider. This is important. °Contact a health care provider if: °· You cannot flush your port with saline as directed, or you cannot draw blood from the port. °· You have a fever or chills. °· You have more redness, swelling, or pain around your port insertion site. °· You have more fluid or blood coming from your port insertion site. °· Your port insertion site feels warm to the touch. °· You have pus or a bad smell coming from the port insertion site. °Get help right away if: °· You have chest pain or shortness of breath. °· You have bleeding from your port that you cannot control. °Summary °· Take care of the port as told by your health care provider. °· Change your dressing as told by your health care provider. °· Keep all follow-up visits as told by your health care provider. °  This information is not intended to replace advice given to you by your health care provider. Make sure you discuss any questions you have with your health care provider. °Document Released: 03/26/2013 Document Revised: 04/26/2016 Document Reviewed: 04/26/2016 °Elsevier Interactive Patient Education © 2017 [employer] °Moderate Conscious Sedation, Adult, Care After °These instructions provide you with information about caring for yourself after your procedure. Your health care provider may also give you more specific instructions. Your treatment has been planned according to current medical  practices, but problems sometimes occur. Call your health care provider if you have any problems or questions after your procedure. °What can I expect after the procedure? °After your procedure, it is common: °· To feel sleepy for several hours. °· To feel clumsy and have poor balance for several hours. °· To have poor judgment for several hours. °· To vomit if you eat too soon. ° °Follow these instructions at home: °For at least 24 hours after the procedure: ° °· Do not: °? Participate in activities where you could fall or become injured. °? Drive. °? Use heavy machinery. °? Drink alcohol. °? Take sleeping pills or medicines that cause drowsiness. °? Make important decisions or sign legal documents. °? Take care of children on your own. °· Rest. °Eating and drinking °· Follow the diet recommended by your health care provider. °· If you vomit: °? Drink water, juice, or soup when you can drink without vomiting. °? Make sure you have little or no nausea before eating solid foods. °General instructions °· Have a responsible adult stay with you until you are awake and alert. °· Take over-the-counter and prescription medicines only as told by your health care provider. °· If you smoke, do not smoke without supervision. °· Keep all follow-up visits as told by your health care provider. This is important. °Contact a health care provider if: °· You keep feeling nauseous or you keep vomiting. °· You feel light-headed. °· You develop a rash. °· You have a fever. °Get help right away if: °· You have trouble breathing. °This information is not intended to replace advice given to you by your health care provider. Make sure you discuss any questions you have with your health care provider. °Document Released: 03/26/2013 Document Revised: 11/08/2015 Document Reviewed: 09/25/2015 °Elsevier Interactive Patient Education © 2018 [employer] ° °

## 2018-02-11 NOTE — H&P (Signed)
Chief Complaint: Patient was seen in consultation today for ovarian cancer  Referring Physician(s): Vandiver  Supervising Physician: Corrie Mckusick  Patient Status: [facility] - Out-pt  History of Present Illness: Shannon Ellis is a 84 y.o. female with past medical history of HTN, anemia, osteoporosis who was found to have a palpable pelvic mass by her PCP.  She was referred to GYN who performed a laprascopic biopsy demonstrating ovarian cancer.  She now plans for chemotherapy and is in need of durable venous access.   Patient presents for procedure today in her usual state of health.  She denies fever, chills, abdominal pain, nausea, vomiting, dysuria, back pain.  She has been NPO.  She does not take blood thinners.   Past Medical History:  Diagnosis Date  . Allergy   . Anemia   . Blood transfusion without reported diagnosis   . Complication of anesthesia    waking up-does not take much medication  . Family history of adverse reaction to anesthesia    problems waking up as does not need much medication  . Hypertension   . Hypothyroidism   . Osteoporosis 10/07   Dexa scan  . PONV (postoperative nausea and vomiting)     Past Surgical History:  Procedure Laterality Date  . APPENDECTOMY  1950   not ruptured  . LAPAROSCOPY N/A 01/31/2018   Procedure: LAPAROSCOPY DIAGNOSTIC WITH BIOPSY;  Surgeon: [name], MD;  Location: [facility] ORS;  Service: Gynecology;  Laterality: N/A;  . THYROIDECTOMY    . TONSILLECTOMY    . TOTAL HIP ARTHROPLASTY Right     Allergies: Patient has no known allergies.  Medications: Prior to Admission medications   Medication Sig Start Date End Date Taking? Authorizing Provider  amLODipine (NORVASC) 5 MG tablet Take 5 mg by mouth daily at 6 PM.   Yes [provider]  levothyroxine (SYNTHROID, LEVOTHROID) 88 MCG tablet Take 88 mcg by mouth daily before breakfast.   Yes [provider]  loratadine (CLARITIN) 10 MG tablet Take 10 mg  by mouth daily as needed for allergies (for congestion).    Yes [provider]  metoprolol succinate (TOPROL-XL) 50 MG 24 hr tablet Take 50 mg by mouth daily. In the morning 10/15/17  Yes [provider]  Multiple Vitamins-Minerals (MULTIVITAMINS THER. W/MINERALS) TABS Take 1 tablet by mouth daily.     Yes [provider]  olmesartan (BENICAR) 40 MG tablet Take 40 mg by mouth daily at 6 PM.    Yes [provider]  ibuprofen (ADVIL,MOTRIN) 200 MG tablet Take 200 mg by mouth daily as needed. For pain     [provider]     Family History  Problem Relation Age of Onset  . Diabetes Mother   . Stroke Mother   . Breast cancer Sister 43       breast ca  . Diabetes Sister   . Lung cancer Brother   . Stroke Brother     Social History   Socioeconomic History  . Marital status: Single    Spouse name: Not on file  . Number of children: 4  . Years of education: Not on file  . Highest education level: Not on file  Occupational History  . Occupation: retired  Scientific laboratory technician  . Financial resource strain: Not on file  . Food insecurity:    Worry: Not on file    Inability: Not on file  . Transportation needs:    Medical: Not on file  Non-medical: Not on file  Tobacco Use  . Smoking status: Never Smoker  . Smokeless tobacco: Never Used  Substance and Sexual Activity  . Alcohol use: No    Alcohol/week: 0.0 standard drinks  . Drug use: No  . Sexual activity: Not on file  Lifestyle  . Physical activity:    Days per week: Not on file    Minutes per session: Not on file  . Stress: Not on file  Relationships  . Social connections:    Talks on phone: Not on file    Gets together: Not on file    Attends religious service: Not on file    Active member of club or organization: Not on file    Attends meetings of clubs or organizations: Not on file    Relationship status: Not on file  Other Topics Concern  . Not on file  Social History  Narrative  . Not on file     Review of Systems: A 12 point ROS discussed and pertinent positives are indicated in the HPI above.  All other systems are negative.  Review of Systems  Constitutional: Negative for fatigue and fever.  Respiratory: Negative for cough and shortness of breath.   Cardiovascular: Negative for chest pain.  Gastrointestinal: Negative for abdominal pain, nausea and vomiting.  Genitourinary: Negative for dysuria.  Musculoskeletal: Negative for back pain.  Psychiatric/Behavioral: Negative for behavioral problems and confusion.    Vital Signs: BP (!) 162/86   Pulse 64   Temp 97.6 F (36.4 C) (Oral)   Resp 16   Ht 5\' 6"  (1.676 m)   Wt 166 lb (75.3 kg)   SpO2 100%   BMI 26.79 kg/m   Physical Exam  Constitutional: She is oriented to person, place, and time. She appears well-developed.  Neck: Normal range of motion. Neck supple. No tracheal deviation present.  Cardiovascular: Normal rate, regular rhythm and normal heart sounds.  Pulmonary/Chest: Effort normal and breath sounds normal. No respiratory distress.  Abdominal: Soft. She exhibits mass. She exhibits no distension.  Neurological: She is alert and oriented to person, place, and time.  Skin: Skin is warm and dry.  Psychiatric: She has a normal mood and affect. Her behavior is normal. Judgment and thought content normal.  Nursing note and vitals reviewed.    MD Evaluation Airway: WNL Heart: WNL Abdomen: WNL Chest/ Lungs: WNL ASA  Classification: 3 Mallampati/Airway Score: One   Imaging: Dg Chest 2 View  Result Date: 01/30/2018 CLINICAL DATA:  Preoperative chest radiograph prior to pelvic surgery. EXAM: CHEST - 2 VIEW COMPARISON:  06/07/2013 and prior radiographs FINDINGS: Mild cardiomegaly and chronic peribronchial thickening again noted. There is no evidence of focal airspace disease, pulmonary edema, suspicious pulmonary nodule/mass, pleural effusion, or pneumothorax. No acute bony  abnormalities are identified. IMPRESSION: No evidence of acute cardiopulmonary disease. Mild cardiomegaly and chronic peribronchial thickening. Electronically Signed   By: Margarette Canada M.D.   On: 01/30/2018 14:06   Ct Biopsy  Result Date: 01/21/2018 INDICATION: No known primary, now with pelvic mass and omental nodules worrisome for metastatic disease. Please perform CT-guided biopsy for tissue diagnostic purposes. EXAM: CT-GUIDED BIOPSY OF OMENTAL NODULE COMPARISON:  CT abdomen pelvis - 01/02/2018 MEDICATIONS: None. ANESTHESIA/SEDATION: Fentanyl 50 mcg IV; Versed 1 mg IV Sedation time: 10 minutes; The patient was continuously monitored during the procedure by the interventional radiology nurse under my direct supervision. CONTRAST:  None. COMPLICATIONS: None immediate. PROCEDURE: Informed consent was obtained from the patient following an explanation of  the procedure, risks, benefits and alternatives. A time out was performed prior to the initiation of the procedure. The patient was positioned supine on the CT table and a limited CT was performed for procedural planning demonstrating unchanged appearance of the dominant approximately 2.2 x 1.3 cm omental nodule within the right lower abdomen adjacent to the cecum (image 63, series 2). The procedure was planned. The operative site was prepped and draped in the usual sterile fashion. Appropriate trajectory was confirmed with a 22 gauge spinal needle after the adjacent tissues were anesthetized with 1% Lidocaine with epinephrine. Under intermittent CT guidance, a 17 gauge coaxial needle was advanced into the peripheral aspect of the mass. Appropriate positioning was confirmed and 5 core needle biopsy samples were obtained with an 18 gauge core needle biopsy device. The co-axial needle was removed and hemostasis was achieved with manual compression. A limited postprocedural CT was negative for hemorrhage or additional complication. A dressing was placed. The patient  tolerated the procedure well without immediate postprocedural complication. IMPRESSION: Technically successful CT guided core needle biopsy of dominant omental nodule within the right lower abdominal quadrant, adjacent to the cecum. Electronically Signed   By: Sandi Mariscal M.D.   On: 01/21/2018 11:22    Labs:  CBC: Recent Labs    01/21/18 0900 01/30/18 0843 02/11/18 0706  WBC 8.1 6.7 6.9  HGB 13.3 12.7 12.6  HCT 41.8 39.8 39.6  PLT 402* 379 370    COAGS: Recent Labs    01/21/18 0900  INR 1.01    BMP: Recent Labs    01/21/18 0900 01/30/18 0843  NA 138 143  K 4.3 4.7  CL 104 106  CO2 26 27  GLUCOSE 112* 110*  BUN 13 16  CALCIUM 9.3 9.8  CREATININE 0.68 0.78  GFRNONAA >60 >60  GFRAA >60 >60    LIVER FUNCTION TESTS: Recent Labs    01/21/18 0900 01/30/18 0843  BILITOT 0.6 0.8  AST 36 27  ALT 20 16  ALKPHOS 302* 338*  PROT 8.2* 8.1  ALBUMIN 4.2 4.1    TUMOR MARKERS: No results for input(s): AFPTM, CEA, CA199, CHROMGRNA in the last 8760 hours.  Assessment and Plan: Patient with past medical history of HTN, osteoporosis presents with complaint of ovarian cancer.  IR consulted for Port-A-Cath placement at the request of Dr. Alvy Bimler. Case reviewed by Dr. Earleen Newport who approves patient for procedure.  Patient presents today in their usual state of health.  She has been NPO and is not currently on blood thinners.   Risks and benefits of image guided port-a-catheter placement was discussed with the patient including, but not limited to bleeding, infection, pneumothorax, or fibrin sheath development and need for additional procedures.  All of the patient's questions were answered, patient is agreeable to proceed. Consent signed and in chart.   Thank you for this interesting consult.  I greatly enjoyed meeting Gayla Benn and look forward to participating in their care.  A copy of this report was sent to the requesting provider on this date.  Electronically  Signed: Docia Barrier, PA 02/11/2018, 8:01 AM   I spent a total of  30 Minutes   in face to face in clinical consultation, greater than 50% of which was counseling/coordinating care for ovarian cancer.

## 2018-02-11 NOTE — Sedation Documentation (Signed)
Patient is resting comfortably. 

## 2018-02-12 ENCOUNTER — Inpatient Hospital Stay (HOSPITAL_BASED_OUTPATIENT_CLINIC_OR_DEPARTMENT_OTHER): Payer: Medicare Other | Admitting: Gynecology

## 2018-02-12 ENCOUNTER — Encounter: Payer: Self-pay | Admitting: Obstetrics

## 2018-02-12 ENCOUNTER — Encounter: Payer: Self-pay | Admitting: Oncology

## 2018-02-12 VITALS — BP 156/68 | HR 67 | Temp 98.0°F | Resp 20 | Ht 66.0 in | Wt 166.5 lb

## 2018-02-12 DIAGNOSIS — Z79899 Other long term (current) drug therapy: Secondary | ICD-10-CM | POA: Diagnosis not present

## 2018-02-12 DIAGNOSIS — R971 Elevated cancer antigen 125 [CA 125]: Secondary | ICD-10-CM | POA: Diagnosis not present

## 2018-02-12 DIAGNOSIS — C569 Malignant neoplasm of unspecified ovary: Secondary | ICD-10-CM | POA: Diagnosis not present

## 2018-02-12 DIAGNOSIS — C786 Secondary malignant neoplasm of retroperitoneum and peritoneum: Secondary | ICD-10-CM | POA: Diagnosis not present

## 2018-02-12 DIAGNOSIS — C562 Malignant neoplasm of left ovary: Secondary | ICD-10-CM | POA: Diagnosis not present

## 2018-02-12 DIAGNOSIS — Z5111 Encounter for antineoplastic chemotherapy: Secondary | ICD-10-CM | POA: Diagnosis not present

## 2018-02-13 ENCOUNTER — Other Ambulatory Visit: Payer: Medicare Other

## 2018-02-13 ENCOUNTER — Telehealth: Payer: Self-pay | Admitting: Oncology

## 2018-02-13 NOTE — Telephone Encounter (Signed)
Called Shannon Ellis to see how much drainage she is having from her incision today.  She said the drainage has stopped and she has been moving around a lot.  She has kept a pad over it just in case and said it is still dry. She will call if it does start draining again.

## 2018-02-14 ENCOUNTER — Other Ambulatory Visit: Payer: Self-pay | Admitting: Hematology and Oncology

## 2018-02-14 ENCOUNTER — Inpatient Hospital Stay: Payer: Medicare Other

## 2018-02-14 ENCOUNTER — Encounter: Payer: Self-pay | Admitting: Hematology and Oncology

## 2018-02-14 ENCOUNTER — Telehealth: Payer: Self-pay | Admitting: Hematology and Oncology

## 2018-02-14 ENCOUNTER — Inpatient Hospital Stay (HOSPITAL_BASED_OUTPATIENT_CLINIC_OR_DEPARTMENT_OTHER): Payer: Medicare Other | Admitting: Hematology and Oncology

## 2018-02-14 VITALS — BP 140/77 | HR 67 | Temp 97.8°F | Resp 17 | Ht 66.0 in | Wt 166.4 lb

## 2018-02-14 DIAGNOSIS — C786 Secondary malignant neoplasm of retroperitoneum and peritoneum: Secondary | ICD-10-CM

## 2018-02-14 DIAGNOSIS — R971 Elevated cancer antigen 125 [CA 125]: Secondary | ICD-10-CM

## 2018-02-14 DIAGNOSIS — C562 Malignant neoplasm of left ovary: Secondary | ICD-10-CM | POA: Diagnosis not present

## 2018-02-14 DIAGNOSIS — Z79899 Other long term (current) drug therapy: Secondary | ICD-10-CM

## 2018-02-14 DIAGNOSIS — R748 Abnormal levels of other serum enzymes: Secondary | ICD-10-CM | POA: Insufficient documentation

## 2018-02-14 DIAGNOSIS — Z5111 Encounter for antineoplastic chemotherapy: Secondary | ICD-10-CM | POA: Diagnosis not present

## 2018-02-14 LAB — COMPREHENSIVE METABOLIC PANEL
ALBUMIN: 3.4 g/dL — AB (ref 3.5–5.0)
ALT: 11 U/L (ref 0–44)
AST: 20 U/L (ref 15–41)
Alkaline Phosphatase: 344 U/L — ABNORMAL HIGH (ref 38–126)
Anion gap: 8 (ref 5–15)
BUN: 11 mg/dL (ref 8–23)
CHLORIDE: 103 mmol/L (ref 98–111)
CO2: 27 mmol/L (ref 22–32)
Calcium: 9.7 mg/dL (ref 8.9–10.3)
Creatinine, Ser: 0.97 mg/dL (ref 0.44–1.00)
GFR calc Af Amer: >60 mL/min (ref >60)
GFR calc non Af Amer: 53 mL/min — ABNORMAL LOW (ref >60)
GLUCOSE: 131 mg/dL — AB (ref 70–99)
POTASSIUM: 4.1 mmol/L (ref 3.5–5.1)
Sodium: 138 mmol/L (ref 135–145)
Total Bilirubin: 0.3 mg/dL (ref 0.3–1.2)
Total Protein: 7 g/dL (ref 6.5–8.1)

## 2018-02-14 LAB — CBC WITH DIFFERENTIAL/PLATELET
BASOS ABS: 0 10*3/uL (ref 0.0–0.1)
Basophils Relative: 0 %
Eosinophils Absolute: 0.2 10*3/uL (ref 0.0–0.5)
Eosinophils Relative: 2 %
HEMATOCRIT: 36.4 % (ref 34.8–46.6)
HEMOGLOBIN: 11.7 g/dL (ref 11.6–15.9)
LYMPHS ABS: 1.5 10*3/uL (ref 0.9–3.3)
LYMPHS PCT: 16 %
MCH: 28.6 pg (ref 25.1–34.0)
MCHC: 32.1 g/dL (ref 31.5–36.0)
MCV: 89 fL (ref 79.5–101.0)
Monocytes Absolute: 0.8 10*3/uL (ref 0.1–0.9)
Monocytes Relative: 9 %
NEUTROS ABS: 6.9 10*3/uL — AB (ref 1.5–6.5)
NEUTROS PCT: 73 %
Platelets: 314 10*3/uL (ref 145–400)
RBC: 4.09 MIL/uL (ref 3.70–5.45)
RDW: 14.5 % (ref 11.2–14.5)
WBC: 9.4 10*3/uL (ref 3.9–10.3)

## 2018-02-14 MED ORDER — ONDANSETRON HCL 8 MG PO TABS: 8.0000 mg | ORAL_TABLET | Freq: Three times a day (TID) | ORAL | 1 refills | Status: DC | PRN

## 2018-02-14 MED ORDER — DEXAMETHASONE 4 MG PO TABS: ORAL_TABLET | ORAL | 0 refills | Status: DC

## 2018-02-14 MED ORDER — PROCHLORPERAZINE MALEATE 10 MG PO TABS: 10.0000 mg | ORAL_TABLET | Freq: Four times a day (QID) | ORAL | 1 refills | Status: DC | PRN

## 2018-02-14 MED ORDER — LIDOCAINE-PRILOCAINE 2.5-2.5 % EX CREA: TOPICAL_CREAM | CUTANEOUS | 3 refills | Status: DC

## 2018-02-14 MED FILL — DEXAMETHASONE 4 MG TABLET: 4 | 42 days supply | Qty: 36 | Fill #0

## 2018-02-14 MED FILL — ONDANSETRON HCL 8 MG TABLET: 8 | 21 days supply | Qty: 18 | Fill #0

## 2018-02-14 MED FILL — LIDOCAINE-PRILOCAINE CREAM: 2.5-2.5 | 30 days supply | Qty: 30 | Fill #0

## 2018-02-14 MED FILL — PROCHLORPERAZINE 10 MG TAB: 10 | 8 days supply | Qty: 30 | Fill #0

## 2018-02-14 NOTE — Assessment & Plan Note (Signed)
We have extensive discussion about the role of neoadjuvant adjuvant chemotherapy We reviewed the NCCN guidelines We discussed the role of chemotherapy. The intent is of curative intent.  We discussed some of the risks, benefits, side-effects of carboplatin & Taxol. Treatment is intravenous, every 3 weeks x 6 cycles  Some of the short term side-effects included, though not limited to, including weight loss, life threatening infections, risk of allergic reactions, need for transfusions of blood products, nausea, vomiting, change in bowel habits, loss of hair, admission to hospital for various reasons, and risks of death.   Long term side-effects are also discussed including risks of infertility, permanent damage to nerve function, hearing loss, chronic fatigue, kidney damage with possibility needing hemodialysis, and rare secondary malignancy including bone marrow disorders.  The patient is aware that the response rates discussed earlier is not guaranteed.  After a long discussion, patient made an informed decision to proceed with the prescribed plan of care.   Patient education material was dispensed. We discussed premedication with dexamethasone before chemotherapy. I recommend no G-CSF support with her first cycle of therapy I recommend minimum 3 rounds of chemo before repeat imaging study for possible interval debulking surgery

## 2018-02-14 NOTE — Telephone Encounter (Signed)
Gave pt avs and calendar  °

## 2018-02-14 NOTE — Assessment & Plan Note (Signed)
This is likely related to recent surgery Observe only.  She is not symptomatic

## 2018-02-14 NOTE — Progress Notes (Signed)
[place] OFFICE PROGRESS NOTE  Patient Care Team: Marton Redwood, [name]  ASSESSMENT & PLAN:  Ovarian cancer [facility]) We have extensive discussion about the role of neoadjuvant adjuvant chemotherapy We reviewed the NCCN guidelines We discussed the role of chemotherapy. The intent is of curative intent.  We discussed some of the risks, benefits, side-effects of carboplatin & Taxol. Treatment is intravenous, every 3 weeks x 6 cycles  Some of the short term side-effects included, though not limited to, including weight loss, life threatening infections, risk of allergic reactions, need for transfusions of blood products, nausea, vomiting, change in bowel habits, loss of hair, admission to hospital for various reasons, and risks of death.   Long term side-effects are also discussed including risks of infertility, permanent damage to nerve function, hearing loss, chronic fatigue, kidney damage with possibility needing hemodialysis, and rare secondary malignancy including bone marrow disorders.  The patient is aware that the response rates discussed earlier is not guaranteed.  After a long discussion, patient made an informed decision to proceed with the prescribed plan of care.   Patient education material was dispensed. We discussed premedication with dexamethasone before chemotherapy. I recommend no G-CSF support with her first cycle of therapy I recommend minimum 3 rounds of chemo before repeat imaging study for possible interval debulking surgery  Elevated alkaline phosphatase level This is likely related to recent surgery Observe only.  She is not symptomatic   Orders Placed This Encounter  Procedures  . CBC with Differential (Cancer Center Only)    Standing Status:   Standing    Number of Occurrences:   20    Standing Expiration Date:   02/15/2019  . CMP ([facility] only)    Standing Status:   Standing    Number of Occurrences:    20    Standing Expiration Date:   02/15/2019    INTERVAL HISTORY: Please see below for problem oriented charting. She returns with family members for further follow-up and for chemotherapy consent Her case was discussed extensively at the recent GYN oncology tumor board Her second opinion pathology report was reviewed with the patient and family members Since last time I saw her, she is doing well Her surgical scar is healing well. Her appetite is stable.  She denies abdominal bloating, nausea or changes in bowel habits. She had regular bowel habits with regular laxative therapy.  SUMMARY OF ONCOLOGIC HISTORY:   Ovarian cancer ([place])   01/02/2018 Imaging    1. Large complex left paramidline pelvic mass likely rises from the left ovary. Together with left periaortic adenopathy and peritoneal carcinomatosis, findings are worrisome for metastatic ovarian cancer. 2. Small pelvic free fluid. 3.  Aortic atherosclerosis (ICD10-170.0).    01/21/2018 Pathology Results    Omentum, biopsy, dominant omental nodule within the right lower abdominal quadrant - POORLY DIFFERENTIATED CARCINOMA CONSISTENT WITH GYNECOLOGIC PRIMARY. - SEE MICROSCOPIC DESCRIPTION. Microscopic Comment The tumor is characterized by sheets of tumor cells with enlarged nuclei, many containing prominent nucleoli. There are patchy areas with eosinophilic cytoplasmic inclusions. Immunohistochemistry shows positivity with cytokeratin AE1/AE3, cytokeratin 7, estrogen receptor, progesterone receptor, PAX-8, WT-1, and placental alkaline phosphatase (PLAP). The tumor is negative with alpha fetoprotein, human chorionic gonadotropin, CD117, CD10, CD30, CD56, CDX-2, CEA, cytokeratin 20, S100, SOX-10, synaptophysin, chromogranin, D2-40, GATA-3, GCDFP, Glypican 3 and Inhibin. 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 The estrogen receptor is 100% with strong staining and  the progesterone  receptor is 90% with strong staining.    01/21/2018 Procedure    Technically successful CT guided core needle biopsy of dominant omental nodule within the right lower abdominal quadrant, adjacent to the cecum.    01/24/2018 Cancer Staging    Staging form: Ovary, Fallopian Tube, and Primary Peritoneal Carcinoma, AJCC 8th Edition - Clinical: Stage IIIC (cT3c, cN1b, cM0) - Signed by [name], MD on 01/24/2018    01/25/2018 Tumor Marker    Patient's tumor was tested for the following markers: CA-125 Results of the tumor marker test revealed 4.7    01/31/2018 Surgery    Pre-operative Diagnosis: Ovarian cancer NOS  Post-operative Diagnosis: At least Stage 3 ovarian cancer   Operation: Laparoscopic omental biopsy  Surgeon: Mart Piggs, MD  Specimens: Omentum  Operative Findings: Omental nodule >2cm. Large left pelvic mass 12-15cm with sigmoid draped medially and potentially adherent to colon. The mass appeared to have a 3cm cystic area that was ruptured preoperatively. Uterus appears normal with small anterior fibroid ~1cm. Right adnexa nonenlarged. Small bowel without evidence of disease. No diaphragmatic disease.     01/31/2018 Pathology Results    Omentum, resection for tumor - HIGH GRADE CARCINOMA. - SEE MICROSCOPIC DESCRIPTION. Microscopic Comment This case is sent to Dr. Annitta Jersey for consultation. The case was discussed with Dr. Gerarda Fraction on 02/01/18. (JDP:kh 02/01/18) The omentum shows nodules of high grade carcinoma characterized by diffuse sheets of malignant cells with enlarged nuclei with prominent nucleoli. There is moderate to abundant eosinophilic cytoplasm and frequent mitotic figures. 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 The entire specimen is examined histologically and there are nodules of tumor throughout the specimen with identical morphology. Additional immunohistochemistry for hepatoid markers will be performed and reported as an addendum    02/11/2018 Procedure    Status post right IJ port catheter placement. Catheter ready for use.     REVIEW OF SYSTEMS:   Constitutional: Denies fevers, chills or abnormal weight loss Eyes: Denies blurriness of vision Ears, nose, mouth, throat, and face: Denies mucositis or sore throat Respiratory: Denies cough, dyspnea or wheezes Cardiovascular: Denies palpitation, chest discomfort or lower extremity swelling Skin: Denies abnormal skin rashes Lymphatics: Denies new lymphadenopathy or easy bruising Neurological:Denies numbness, tingling or new weaknesses Behavioral/Psych: Mood is stable, no new changes  All other systems were reviewed with the patient and are negative.  I have reviewed the past medical history, past surgical history, social history and family history with the patient and they are unchanged from previous note.  ALLERGIES:  has No Known Allergies.  MEDICATIONS:  Current Outpatient Medications  Medication Sig Dispense Refill  . amLODipine (NORVASC) 5 MG tablet Take 5 mg by mouth daily at 6 PM.    . dexamethasone (DECADRON) 4 MG tablet Take 3 tabs at the night before and 3 tab the morning of chemotherapy, every 3 weeks, by mouth 36 tablet 0  . ibuprofen (ADVIL,MOTRIN) 200 MG tablet Take 200 mg by mouth daily as needed. For pain     . levothyroxine (SYNTHROID, LEVOTHROID) 88 MCG tablet Take 88 mcg by mouth daily before breakfast.    . lidocaine-prilocaine (EMLA) cream Apply to affected area once 30 g 3  . loratadine (CLARITIN) 10  MG tablet Take 10 mg by mouth daily as needed for allergies (for congestion).     . metoprolol succinate (TOPROL-XL) 50 MG 24 hr tablet Take 50 mg by mouth daily. In the morning    . Multiple  Vitamins-Minerals (MULTIVITAMINS THER. W/MINERALS) TABS Take 1 tablet by mouth daily.      Marland Kitchen olmesartan (BENICAR) 40 MG tablet Take 40 mg by mouth daily at 6 PM.     . ondansetron (ZOFRAN) 8 MG tablet Take 1 tablet (8 mg total) by mouth every 8 (eight) hours as needed for refractory nausea / vomiting. Start on day 3 after chemo. 30 tablet 1  . prochlorperazine (COMPAZINE) 10 MG tablet Take 1 tablet (10 mg total) by mouth every 6 (six) hours as needed (Nausea or vomiting). 30 tablet 1   No current facility-administered medications for this visit.     PHYSICAL EXAMINATION: ECOG PERFORMANCE STATUS: 1 - Symptomatic but completely ambulatory  Vitals:   02/14/18 1427  BP: 140/77  Pulse: 67  Resp: 17  Temp: 97.8 F (36.6 C)  SpO2: 96%   Filed Weights   02/14/18 1427  Weight: 166 lb 6.4 oz (75.5 kg)    GENERAL:alert, no distress and comfortable SKIN: skin color, texture, turgor are normal, no rashes or significant lesions EYES: normal, Conjunctiva are pink and non-injected, sclera clear OROPHARYNX:no exudate, no erythema and lips, buccal mucosa, and tongue normal  NECK: supple, thyroid normal size, non-tender, without nodularity LYMPH:  no palpable lymphadenopathy in the cervical, axillary or inguinal LUNGS: clear to auscultation and percussion with normal breathing effort HEART: regular rate & rhythm and no murmurs and no lower extremity edema ABDOMEN:abdomen soft, non-tender and normal bowel sounds.  She has well-healed surgical scar Musculoskeletal:no cyanosis of digits and no clubbing  NEURO: alert & oriented x 3 with fluent speech, no focal motor/sensory deficits  LABORATORY DATA:  I have reviewed the data as listed    Component Value Date/Time   NA 138 02/14/2018 1505   K 4.1 02/14/2018 1505   CL 103 02/14/2018 1505   CO2 27 02/14/2018 1505   GLUCOSE 131 (H) 02/14/2018 1505   BUN 11 02/14/2018 1505   CREATININE 0.97 02/14/2018 1505   CALCIUM 9.7 02/14/2018 1505   PROT  7.0 02/14/2018 1505   ALBUMIN 3.4 (L) 02/14/2018 1505   AST 20 02/14/2018 1505   ALT 11 02/14/2018 1505   ALKPHOS 344 (H) 02/14/2018 1505   BILITOT 0.3 02/14/2018 1505   GFRNONAA 53 (L) 02/14/2018 1505   GFRAA >60 02/14/2018 1505    No results found for: SPEP, UPEP  Lab Results  Component Value Date   WBC 9.4 02/14/2018   NEUTROABS 6.9 (H) 02/14/2018   HGB 11.7 02/14/2018   HCT 36.4 02/14/2018   MCV 89.0 02/14/2018   PLT 314 02/14/2018      Chemistry      Component Value Date/Time   NA 138 02/14/2018 1505   K 4.1 02/14/2018 1505   CL 103 02/14/2018 1505   CO2 27 02/14/2018 1505   BUN 11 02/14/2018 1505   CREATININE 0.97 02/14/2018 1505      Component Value Date/Time   CALCIUM 9.7 02/14/2018 1505   ALKPHOS 344 (H) 02/14/2018 1505   AST 20 02/14/2018 1505   ALT 11 02/14/2018 1505   BILITOT 0.3 02/14/2018 1505       RADIOGRAPHIC STUDIES: I have personally reviewed the radiological images as listed and agreed with the findings in the report. Dg Chest  2 View  Result Date: 01/30/2018 CLINICAL DATA:  Preoperative chest radiograph prior to pelvic surgery. EXAM: CHEST - 2 VIEW COMPARISON:  06/07/2013 and prior radiographs FINDINGS: Mild cardiomegaly and chronic peribronchial thickening again noted. There is no evidence of focal airspace disease, pulmonary edema, suspicious pulmonary nodule/mass, pleural effusion, or pneumothorax. No acute bony abnormalities are identified. IMPRESSION: No evidence of acute cardiopulmonary disease. Mild cardiomegaly and chronic peribronchial thickening. Electronically Signed   By: Margarette Canada M.D.   On: 01/30/2018 14:06   Ct Biopsy  Result Date: 01/21/2018 INDICATION: No known primary, now with pelvic mass and omental nodules worrisome for metastatic disease. Please perform CT-guided biopsy for tissue diagnostic purposes. EXAM: CT-GUIDED BIOPSY OF OMENTAL NODULE COMPARISON:  CT abdomen pelvis - 01/02/2018 MEDICATIONS: None. ANESTHESIA/SEDATION:  Fentanyl 50 mcg IV; Versed 1 mg IV Sedation time: 10 minutes; The patient was continuously monitored during the procedure by the interventional radiology nurse under my direct supervision. CONTRAST:  None. COMPLICATIONS: None immediate. PROCEDURE: Informed consent was obtained from the patient following an explanation of the procedure, risks, benefits and alternatives. A time out was performed prior to the initiation of the procedure. The patient was positioned supine on the CT table and a limited CT was performed for procedural planning demonstrating unchanged appearance of the dominant approximately 2.2 x 1.3 cm omental nodule within the right lower abdomen adjacent to the cecum (image 63, series 2). The procedure was planned. The operative site was prepped and draped in the usual sterile fashion. Appropriate trajectory was confirmed with a 22 gauge spinal needle after the adjacent tissues were anesthetized with 1% Lidocaine with epinephrine. Under intermittent CT guidance, a 17 gauge coaxial needle was advanced into the peripheral aspect of the mass. Appropriate positioning was confirmed and 5 core needle biopsy samples were obtained with an 18 gauge core needle biopsy device. The co-axial needle was removed and hemostasis was achieved with manual compression. A limited postprocedural CT was negative for hemorrhage or additional complication. A dressing was placed. The patient tolerated the procedure well without immediate postprocedural complication. IMPRESSION: Technically successful CT guided core needle biopsy of dominant omental nodule within the right lower abdominal quadrant, adjacent to the cecum. Electronically Signed   By: Sandi Mariscal M.D.   On: 01/21/2018 11:22   Ir Imaging Guided Port Insertion  Result Date: 02/11/2018 INDICATION: 84 year old female with a history ovarian malignancy. She has been referred for port catheter placement. EXAM: IMPLANTED PORT A CATH PLACEMENT WITH ULTRASOUND AND  FLUOROSCOPIC GUIDANCE MEDICATIONS: 2.0 g Ancef; The antibiotic was administered within an appropriate time interval prior to skin puncture. ANESTHESIA/SEDATION: Moderate (conscious) sedation was employed during this procedure. A total of Versed 1.5 mg and Fentanyl 75 mcg was administered intravenously. Moderate Sedation Time: 23 minutes. The patient's level of consciousness and vital signs were monitored continuously by radiology nursing throughout the procedure under my direct supervision. FLUOROSCOPY TIME:  0 minutes, 6 seconds (1 mGy) COMPLICATIONS: None PROCEDURE: The procedure, risks, benefits, and alternatives were explained to the patient. Questions regarding the procedure were encouraged and answered. The patient understands and consents to the procedure. Ultrasound survey was performed with images stored and sent to PACs. The right neck and chest was prepped with chlorhexidine, and draped in the usual sterile fashion using maximum barrier technique (cap and mask, sterile gown, sterile gloves, large sterile sheet, hand hygiene and cutaneous antiseptic). Antibiotic prophylaxis was provided with 2.0g Ancef administered IV one hour prior to skin incision. Local anesthesia was attained by  infiltration with 1% lidocaine without epinephrine. Ultrasound demonstrated patency of the right internal jugular vein, and this was documented with an image. Under real-time ultrasound guidance, this vein was accessed with a 21 gauge micropuncture needle and image documentation was performed. A small dermatotomy was made at the access site with an 11 scalpel. A 0.018" wire was advanced into the SVC and used to estimate the length of the internal catheter. The access needle exchanged for a 55F micropuncture vascular sheath. The 0.018" wire was then removed and a 0.035" wire advanced into the IVC. An appropriate location for the subcutaneous reservoir was selected below the clavicle and an incision was made through the skin and  underlying soft tissues. The subcutaneous tissues were then dissected using a combination of blunt and sharp surgical technique and a pocket was formed. A single lumen power injectable portacatheter was then tunneled through the subcutaneous tissues from the pocket to the dermatotomy and the port reservoir placed within the subcutaneous pocket. The venous access site was then serially dilated and a peel away vascular sheath placed over the wire. The wire was removed and the port catheter advanced into position under fluoroscopic guidance. The catheter tip is positioned in the cavoatrial junction. This was documented with a spot image. The portacatheter was then tested and found to flush and aspirate well. The port was flushed with saline followed by 100 units/mL heparinized saline. The pocket was then closed in two layers using first subdermal inverted interrupted absorbable sutures followed by a running subcuticular suture. The epidermis was then sealed with Dermabond. The dermatotomy at the venous access site was also seal with Dermabond. Patient tolerated the procedure well and remained hemodynamically stable throughout. No complications encountered and no significant blood loss encountered IMPRESSION: Status post right IJ port catheter placement. Catheter ready for use. Signed, Dulcy Fanny. Dellia Nims, RPVI [facility] Electronically Signed   By: Corrie Mckusick D.O.   On: 02/11/2018 11:43    All questions were answered. The patient knows to call the clinic with any problems, questions or concerns. No barriers to learning was detected.  I spent 40 minutes counseling the patient face to face. The total time spent in the appointment was 55 minutes and more than 50% was on counseling and review of test results  [name], MD 02/14/2018 4:07 PM

## 2018-02-15 ENCOUNTER — Other Ambulatory Visit (HOSPITAL_COMMUNITY)
Admission: RE | Admit: 2018-02-15 | Discharge: 2018-02-15 | Disposition: A | Discharge status: Home / Self Care | Payer: Medicare Other | Source: Ambulatory Visit | Attending: Obstetrics | Admitting: Obstetrics

## 2018-02-15 ENCOUNTER — Inpatient Hospital Stay: Payer: Medicare Other

## 2018-02-15 ENCOUNTER — Telehealth: Payer: Self-pay | Admitting: *Deleted

## 2018-02-15 ENCOUNTER — Ambulatory Visit: Payer: Medicare Other | Admitting: Obstetrics

## 2018-02-15 VITALS — BP 136/84 | HR 66 | Temp 98.3°F | Resp 16

## 2018-02-15 DIAGNOSIS — R971 Elevated cancer antigen 125 [CA 125]: Secondary | ICD-10-CM | POA: Diagnosis not present

## 2018-02-15 DIAGNOSIS — C786 Secondary malignant neoplasm of retroperitoneum and peritoneum: Secondary | ICD-10-CM | POA: Diagnosis not present

## 2018-02-15 DIAGNOSIS — C562 Malignant neoplasm of left ovary: Secondary | ICD-10-CM | POA: Diagnosis not present

## 2018-02-15 DIAGNOSIS — Z79899 Other long term (current) drug therapy: Secondary | ICD-10-CM | POA: Diagnosis not present

## 2018-02-15 DIAGNOSIS — Z5111 Encounter for antineoplastic chemotherapy: Secondary | ICD-10-CM | POA: Diagnosis not present

## 2018-02-15 LAB — CA 125: CANCER ANTIGEN (CA) 125: 1523 U/mL — AB (ref 0.0–38.1)

## 2018-02-15 MED ORDER — PALONOSETRON HCL INJECTION 0.25 MG/5ML
0.2500 mg | Freq: Once | INTRAVENOUS | Status: AC
  Administered 2018-02-15: 0.25 mg via INTRAVENOUS

## 2018-02-15 MED ORDER — PALONOSETRON HCL INJECTION 0.25 MG/5ML
INTRAVENOUS | Status: AC
  Filled 2018-02-15: qty 5

## 2018-02-15 MED ORDER — SODIUM CHLORIDE 0.9 % IV SOLN
Freq: Once | INTRAVENOUS | Status: AC
  Administered 2018-02-15: 09:00:00 via INTRAVENOUS
  Filled 2018-02-15: qty 5

## 2018-02-15 MED ORDER — SODIUM CHLORIDE 0.9 % IV SOLN
175.0000 mg/m2 | Freq: Once | INTRAVENOUS | Status: AC
  Administered 2018-02-15: 330 mg via INTRAVENOUS
  Filled 2018-02-15: qty 55

## 2018-02-15 MED ORDER — FAMOTIDINE IN NACL 20-0.9 MG/50ML-% IV SOLN
20.0000 mg | Freq: Once | INTRAVENOUS | Status: AC
  Administered 2018-02-15: 20 mg via INTRAVENOUS

## 2018-02-15 MED ORDER — SODIUM CHLORIDE 0.9% FLUSH
10.0000 mL | INTRAVENOUS | Status: DC | PRN
  Administered 2018-02-15: 10 mL
  Filled 2018-02-15: qty 10

## 2018-02-15 MED ORDER — DIPHENHYDRAMINE HCL 50 MG/ML IJ SOLN
INTRAMUSCULAR | Status: AC
  Filled 2018-02-15: qty 1

## 2018-02-15 MED ORDER — DIPHENHYDRAMINE HCL 50 MG/ML IJ SOLN
50.0000 mg | Freq: Once | INTRAMUSCULAR | Status: AC
  Administered 2018-02-15: 50 mg via INTRAVENOUS

## 2018-02-15 MED ORDER — FAMOTIDINE IN NACL 20-0.9 MG/50ML-% IV SOLN
INTRAVENOUS | Status: AC
  Filled 2018-02-15: qty 50

## 2018-02-15 MED ORDER — SODIUM CHLORIDE 0.9 % IV SOLN
454.2000 mg | Freq: Once | INTRAVENOUS | Status: AC
  Administered 2018-02-15: 450 mg via INTRAVENOUS
  Filled 2018-02-15: qty 45

## 2018-02-15 MED ORDER — HEPARIN SOD (PORK) LOCK FLUSH 100 UNIT/ML IV SOLN
500.0000 [IU] | Freq: Once | INTRAVENOUS | Status: AC | PRN
  Administered 2018-02-15: 500 [IU]
  Filled 2018-02-15: qty 5

## 2018-02-15 MED ORDER — SODIUM CHLORIDE 0.9 % IV SOLN
Freq: Once | INTRAVENOUS | Status: AC
  Administered 2018-02-15: 08:00:00 via INTRAVENOUS
  Filled 2018-02-15: qty 250

## 2018-02-15 NOTE — Telephone Encounter (Signed)
Notified of message below

## 2018-02-15 NOTE — Patient Instructions (Signed)
[facility] Discharge Instructions for Patients Receiving Chemotherapy  Today you received the following chemotherapy agents Taxol and Carboplatin.   To help prevent nausea and vomiting after your treatment, we encourage you to take your nausea medication as directed.   If you develop nausea and vomiting that is not controlled by your nausea medication, call the clinic.   BELOW ARE SYMPTOMS THAT SHOULD BE REPORTED IMMEDIATELY:  *FEVER GREATER THAN 100.5 F  *CHILLS WITH OR WITHOUT FEVER  NAUSEA AND VOMITING THAT IS NOT CONTROLLED WITH YOUR NAUSEA MEDICATION  *UNUSUAL SHORTNESS OF BREATH  *UNUSUAL BRUISING OR BLEEDING  TENDERNESS IN MOUTH AND THROAT WITH OR WITHOUT PRESENCE OF ULCERS  *URINARY PROBLEMS  *BOWEL PROBLEMS  UNUSUAL RASH Items with * indicate a potential emergency and should be followed up as soon as possible.  Feel free to call the clinic should you have any questions or concerns. The clinic phone number is [phone].  Please show the CHEMO ALERT CARD at check-in to the Emergency Department and triage nurse.   Paclitaxel injection What is this medicine? PACLITAXEL (PAK li TAX el) is a chemotherapy drug. It targets fast dividing cells, like cancer cells, and causes these cells to die. This medicine is used to treat ovarian cancer, breast cancer, and other cancers. This medicine may be used for other purposes; ask your health care provider or pharmacist if you have questions. COMMON BRAND NAME(S): Onxol, Taxol What should I tell my health care provider before I take this medicine? They need to know if you have any of these conditions: -blood disorders -irregular heartbeat -infection (especially a virus infection such as chickenpox, cold sores, or herpes) -liver disease -previous or ongoing radiation therapy -an unusual or allergic reaction to paclitaxel, alcohol, polyoxyethylated castor oil, other chemotherapy agents, other medicines, foods,  dyes, or preservatives -pregnant or trying to get pregnant -breast-feeding How should I use this medicine? This drug is given as an infusion into a vein. It is administered in a hospital or clinic by a specially trained health care professional. Talk to your pediatrician regarding the use of this medicine in children. Special care may be needed. Overdosage: If you think you have taken too much of this medicine contact a poison control center or emergency room at once. NOTE: This medicine is only for you. Do not share this medicine with others. What if I miss a dose? It is important not to miss your dose. Call your doctor or health care professional if you are unable to keep an appointment. What may interact with this medicine? Do not take this medicine with any of the following medications: -disulfiram -metronidazole This medicine may also interact with the following medications: -cyclosporine -diazepam -ketoconazole -medicines to increase blood counts like filgrastim, pegfilgrastim, sargramostim -other chemotherapy drugs like cisplatin, doxorubicin, epirubicin, etoposide, teniposide, vincristine -quinidine -testosterone -vaccines -verapamil Talk to your doctor or health care professional before taking any of these medicines: -acetaminophen -aspirin -ibuprofen -ketoprofen -naproxen This list may not describe all possible interactions. Give your health care provider a list of all the medicines, herbs, non-prescription drugs, or dietary supplements you use. Also tell them if you smoke, drink alcohol, or use illegal drugs. Some items may interact with your medicine. What should I watch for while using this medicine? Your condition will be monitored carefully while you are receiving this medicine. You will need important blood work done while you are taking this medicine. This medicine can cause serious allergic reactions. To reduce your risk you   will need to take other medicine(s)  before treatment with this medicine. If you experience allergic reactions like skin rash, itching or hives, swelling of the face, lips, or tongue, tell your doctor or health care professional right away. In some cases, you may be given additional medicines to help with side effects. Follow all directions for their use. This drug may make you feel generally unwell. This is not uncommon, as chemotherapy can affect healthy cells as well as cancer cells. Report any side effects. Continue your course of treatment even though you feel ill unless your doctor tells you to stop. Call your doctor or health care professional for advice if you get a fever, chills or sore throat, or other symptoms of a cold or flu. Do not treat yourself. This drug decreases your body's ability to fight infections. Try to avoid being around people who are sick. This medicine may increase your risk to bruise or bleed. Call your doctor or health care professional if you notice any unusual bleeding. Be careful brushing and flossing your teeth or using a toothpick because you may get an infection or bleed more easily. If you have any dental work done, tell your dentist you are receiving this medicine. Avoid taking products that contain aspirin, acetaminophen, ibuprofen, naproxen, or ketoprofen unless instructed by your doctor. These medicines may hide a fever. Do not become pregnant while taking this medicine. Women should inform their doctor if they wish to become pregnant or think they might be pregnant. There is a potential for serious side effects to an unborn child. Talk to your health care professional or pharmacist for more information. Do not breast-feed an infant while taking this medicine. Men are advised not to father a child while receiving this medicine. This product may contain alcohol. Ask your pharmacist or healthcare provider if this medicine contains alcohol. Be sure to tell all healthcare providers you are taking this  medicine. Certain medicines, like metronidazole and disulfiram, can cause an unpleasant reaction when taken with alcohol. The reaction includes flushing, headache, nausea, vomiting, sweating, and increased thirst. The reaction can last from 30 minutes to several hours. What side effects may I notice from receiving this medicine? Side effects that you should report to your doctor or health care professional as soon as possible: -allergic reactions like skin rash, itching or hives, swelling of the face, lips, or tongue -low blood counts - This drug may decrease the number of white blood cells, red blood cells and platelets. You may be at increased risk for infections and bleeding. -signs of infection - fever or chills, cough, sore throat, pain or difficulty passing urine -signs of decreased platelets or bleeding - bruising, pinpoint red spots on the skin, black, tarry stools, nosebleeds -signs of decreased red blood cells - unusually weak or tired, fainting spells, lightheadedness -breathing problems -chest pain -high or low blood pressure -mouth sores -nausea and vomiting -pain, swelling, redness or irritation at the injection site -pain, tingling, numbness in the hands or feet -slow or irregular heartbeat -swelling of the ankle, feet, hands Side effects that usually do not require medical attention (report to your doctor or health care professional if they continue or are bothersome): -bone pain -complete hair loss including hair on your head, underarms, pubic hair, eyebrows, and eyelashes -changes in the color of fingernails -diarrhea -loosening of the fingernails -loss of appetite -muscle or joint pain -red flush to skin -sweating This list may not describe all possible side effects. Call your doctor for   medical advice about side effects. You may report side effects to FDA at 1-800-FDA-1088. Where should I keep my medicine? This drug is given in a hospital or clinic and will not be  stored at home. NOTE: This sheet is a summary. It may not cover all possible information. If you have questions about this medicine, talk to your doctor, pharmacist, or health care provider.  2018 Elsevier/Gold Standard (2015-04-06 19:58:00)   Carboplatin injection What is this medicine? CARBOPLATIN (KAR boe pla tin) is a chemotherapy drug. It targets fast dividing cells, like cancer cells, and causes these cells to die. This medicine is used to treat ovarian cancer and many other cancers. This medicine may be used for other purposes; ask your health care provider or pharmacist if you have questions. COMMON BRAND NAME(S): Paraplatin What should I tell my health care provider before I take this medicine? They need to know if you have any of these conditions: -blood disorders -hearing problems -kidney disease -recent or ongoing radiation therapy -an unusual or allergic reaction to carboplatin, cisplatin, other chemotherapy, other medicines, foods, dyes, or preservatives -pregnant or trying to get pregnant -breast-feeding How should I use this medicine? This drug is usually given as an infusion into a vein. It is administered in a hospital or clinic by a specially trained health care professional. Talk to your pediatrician regarding the use of this medicine in children. Special care may be needed. Overdosage: If you think you have taken too much of this medicine contact a poison control center or emergency room at once. NOTE: This medicine is only for you. Do not share this medicine with others. What if I miss a dose? It is important not to miss a dose. Call your doctor or health care professional if you are unable to keep an appointment. What may interact with this medicine? -medicines for seizures -medicines to increase blood counts like filgrastim, pegfilgrastim, sargramostim -some antibiotics like amikacin, gentamicin, neomycin, streptomycin, tobramycin -vaccines Talk to your doctor  or health care professional before taking any of these medicines: -acetaminophen -aspirin -ibuprofen -ketoprofen -naproxen This list may not describe all possible interactions. Give your health care provider a list of all the medicines, herbs, non-prescription drugs, or dietary supplements you use. Also tell them if you smoke, drink alcohol, or use illegal drugs. Some items may interact with your medicine. What should I watch for while using this medicine? Your condition will be monitored carefully while you are receiving this medicine. You will need important blood work done while you are taking this medicine. This drug may make you feel generally unwell. This is not uncommon, as chemotherapy can affect healthy cells as well as cancer cells. Report any side effects. Continue your course of treatment even though you feel ill unless your doctor tells you to stop. In some cases, you may be given additional medicines to help with side effects. Follow all directions for their use. Call your doctor or health care professional for advice if you get a fever, chills or sore throat, or other symptoms of a cold or flu. Do not treat yourself. This drug decreases your body's ability to fight infections. Try to avoid being around people who are sick. This medicine may increase your risk to bruise or bleed. Call your doctor or health care professional if you notice any unusual bleeding. Be careful brushing and flossing your teeth or using a toothpick because you may get an infection or bleed more easily. If you have any dental work done,   tell your dentist you are receiving this medicine. Avoid taking products that contain aspirin, acetaminophen, ibuprofen, naproxen, or ketoprofen unless instructed by your doctor. These medicines may hide a fever. Do not become pregnant while taking this medicine. Women should inform their doctor if they wish to become pregnant or think they might be pregnant. There is a potential  for serious side effects to an unborn child. Talk to your health care professional or pharmacist for more information. Do not breast-feed an infant while taking this medicine. What side effects may I notice from receiving this medicine? Side effects that you should report to your doctor or health care professional as soon as possible: -allergic reactions like skin rash, itching or hives, swelling of the face, lips, or tongue -signs of infection - fever or chills, cough, sore throat, pain or difficulty passing urine -signs of decreased platelets or bleeding - bruising, pinpoint red spots on the skin, black, tarry stools, nosebleeds -signs of decreased red blood cells - unusually weak or tired, fainting spells, lightheadedness -breathing problems -changes in hearing -changes in vision -chest pain -high blood pressure -low blood counts - This drug may decrease the number of white blood cells, red blood cells and platelets. You may be at increased risk for infections and bleeding. -nausea and vomiting -pain, swelling, redness or irritation at the injection site -pain, tingling, numbness in the hands or feet -problems with balance, talking, walking -trouble passing urine or change in the amount of urine Side effects that usually do not require medical attention (report to your doctor or health care professional if they continue or are bothersome): -hair loss -loss of appetite -metallic taste in the mouth or changes in taste This list may not describe all possible side effects. Call your doctor for medical advice about side effects. You may report side effects to FDA at 1-800-FDA-1088. Where should I keep my medicine? This drug is given in a hospital or clinic and will not be stored at home. NOTE: This sheet is a summary. It may not cover all possible information. If you have questions about this medicine, talk to your doctor, pharmacist, or health care provider.  2018 Elsevier/Gold Standard  (2007-09-10 14:38:05)  

## 2018-02-15 NOTE — Telephone Encounter (Signed)
-----   Message from [name], MD sent at 02/15/2018  1:02 PM EDT ----- Pls advise her to reduce oral dex to 8 mg the night before and 8 mg morning of treatment ----- Message ----- From: Rennis Harding, RN Sent: 02/15/2018  12:57 PM EDT To: [name], MD  Good afternoon,  Patient took dexamethasone as prescribed prior to chemotherapy today.   Patient had extreme shakes and was anxious after taking medication, I explained effects of steroids.  However, patient and daughter wanted to see about talking with you at upcoming appointment on 9/7 about possibility of reducing.  Just wanted to send you a heads up.   Have a great weekend!  Kathlee Nations

## 2018-02-19 ENCOUNTER — Encounter: Payer: Self-pay | Admitting: Oncology

## 2018-02-19 ENCOUNTER — Ambulatory Visit: Payer: Medicare Other

## 2018-02-20 ENCOUNTER — Telehealth: Payer: Self-pay | Admitting: *Deleted

## 2018-02-20 ENCOUNTER — Ambulatory Visit (HOSPITAL_COMMUNITY): Payer: Medicare Other

## 2018-02-20 NOTE — Telephone Encounter (Signed)
Called pt to discuss how she did with her chemotherapy 02/15/18.  She reports that it has been a journey.  She denies any N/V, diarrhea/constipation.  She is eating good although not really hungry & makes herself eat.  Bowels are moving with stool softeners/miralax & she is voiding well.  She reports feeling "weird-numb-shaking inside-moments of flu-like symptoms-aches-sometimes burning feeling like a wrap around her head & foot"  She also mentioned her eyesight was a little different although she couldn't explain it. She reports sleeping off & on.  She has been resting a lot during the day. She is able to take care of herself & is alone at this time & states that she likes this.  She states her family/friends thinks she is doing well.  Discussed taking some tylenol as needed but to discuss with Dr Alvy Bimler if Advil is OK.  Message routed to Dr Alvy Bimler.

## 2018-02-20 NOTE — Telephone Encounter (Signed)
-----   Message from Rennis Harding, RN sent at 02/15/2018  2:23 PM EDT ----- Regarding: Dr. Alvy Bimler 1st time chemo follow up 1st time chemo follow up.  Taxol/Carboplatin.  Pt did great!

## 2018-02-20 NOTE — Telephone Encounter (Signed)
Patient's daughter Myriam Forehand called in with a question about the patient's stitches.  Called both patient and patient's daughter, but didn't get an answer.  I left a message for either of them to give our office a call back.

## 2018-02-21 ENCOUNTER — Telehealth: Payer: Self-pay | Admitting: Oncology

## 2018-02-21 ENCOUNTER — Telehealth: Payer: Self-pay

## 2018-02-21 NOTE — Telephone Encounter (Signed)
Patient called and asked if she needs to have her stiches removed.  Discussed possibility for drainage if stitches are removed and asked if she is OK leaving them in until her next appointments on 03/05/18 per Joylene John, NP.  She said she would like to leave them in until 03/05/18.  Advised her to call if they have any redness or irritation.  She verbalized understanding and agreement.

## 2018-02-21 NOTE — Telephone Encounter (Signed)
Ok to take advil/tylenol for mild pain or headache The sensation in her head & foot is likely due to treatment. I will assess in her next visit

## 2018-02-21 NOTE — Telephone Encounter (Signed)
Spoke with pt by phone regarding her report of s/e to the nurse on phone call from 9/4.  Pt states so far, she is feeling much better today and reports eating small meal and drinking a chocolate protein drink.  Pt instructed to be mindful of sugar content in drinks.  Pt also instructed that she may take Tylenol or Advil for aches and pains and to call back if symptoms persist or worsen.  Pt verbalizes understanding.  Call transferred to Elmo Putt, RN regarding need for removal of abdominal stitches.

## 2018-02-23 ENCOUNTER — Other Ambulatory Visit: Payer: Self-pay

## 2018-02-23 ENCOUNTER — Emergency Department (HOSPITAL_COMMUNITY)
Admission: EM | Admit: 2018-02-23 | Discharge: 2018-02-23 | Disposition: A | Discharge status: Home / Self Care | Payer: Medicare Other | Attending: Emergency Medicine | Admitting: Emergency Medicine

## 2018-02-23 ENCOUNTER — Encounter (HOSPITAL_COMMUNITY): Payer: Self-pay

## 2018-02-23 ENCOUNTER — Emergency Department (HOSPITAL_COMMUNITY): Payer: Medicare Other

## 2018-02-23 DIAGNOSIS — N134 Hydroureter: Secondary | ICD-10-CM | POA: Diagnosis not present

## 2018-02-23 DIAGNOSIS — N1330 Unspecified hydronephrosis: Secondary | ICD-10-CM | POA: Diagnosis not present

## 2018-02-23 DIAGNOSIS — R112 Nausea with vomiting, unspecified: Secondary | ICD-10-CM

## 2018-02-23 DIAGNOSIS — Z79899 Other long term (current) drug therapy: Secondary | ICD-10-CM | POA: Insufficient documentation

## 2018-02-23 DIAGNOSIS — D701 Agranulocytosis secondary to cancer chemotherapy: Secondary | ICD-10-CM | POA: Diagnosis not present

## 2018-02-23 DIAGNOSIS — T451X5A Adverse effect of antineoplastic and immunosuppressive drugs, initial encounter: Secondary | ICD-10-CM | POA: Diagnosis not present

## 2018-02-23 DIAGNOSIS — R1031 Right lower quadrant pain: Secondary | ICD-10-CM | POA: Insufficient documentation

## 2018-02-23 DIAGNOSIS — C569 Malignant neoplasm of unspecified ovary: Secondary | ICD-10-CM | POA: Insufficient documentation

## 2018-02-23 DIAGNOSIS — R197 Diarrhea, unspecified: Secondary | ICD-10-CM | POA: Diagnosis not present

## 2018-02-23 HISTORY — DX: Malignant (primary) neoplasm, unspecified: C80.1

## 2018-02-23 LAB — URINALYSIS, ROUTINE W REFLEX MICROSCOPIC
Bilirubin Urine: NEGATIVE
GLUCOSE, UA: NEGATIVE mg/dL
Hgb urine dipstick: NEGATIVE
Ketones, ur: 5 mg/dL — AB
LEUKOCYTES UA: NEGATIVE
Nitrite: NEGATIVE
PH: 7 (ref 5.0–8.0)
Protein, ur: NEGATIVE mg/dL
Specific Gravity, Urine: 1.013 (ref 1.005–1.030)

## 2018-02-23 LAB — CBC
HEMATOCRIT: 35.3 % — AB (ref 36.0–46.0)
HEMOGLOBIN: 11.7 g/dL — AB (ref 12.0–15.0)
MCH: 28.9 pg (ref 26.0–34.0)
MCHC: 33.1 g/dL (ref 30.0–36.0)
MCV: 87.2 fL (ref 78.0–100.0)
Platelets: 248 10*3/uL (ref 150–400)
RBC: 4.05 MIL/uL (ref 3.87–5.11)
RDW: 14.3 % (ref 11.5–15.5)
WBC: 1.9 10*3/uL — AB (ref 4.0–10.5)

## 2018-02-23 LAB — COMPREHENSIVE METABOLIC PANEL
ALT: 30 U/L (ref 0–44)
ANION GAP: 8 (ref 5–15)
AST: 34 U/L (ref 15–41)
Albumin: 3.5 g/dL (ref 3.5–5.0)
Alkaline Phosphatase: 231 U/L — ABNORMAL HIGH (ref 38–126)
BUN: 17 mg/dL (ref 8–23)
CO2: 25 mmol/L (ref 22–32)
Calcium: 9.4 mg/dL (ref 8.9–10.3)
Chloride: 105 mmol/L (ref 98–111)
Creatinine, Ser: 0.66 mg/dL (ref 0.44–1.00)
Glucose, Bld: 122 mg/dL — ABNORMAL HIGH (ref 70–99)
POTASSIUM: 4.2 mmol/L (ref 3.5–5.1)
Sodium: 138 mmol/L (ref 135–145)
Total Bilirubin: 0.5 mg/dL (ref 0.3–1.2)
Total Protein: 6.7 g/dL (ref 6.5–8.1)

## 2018-02-23 LAB — LIPASE, BLOOD: LIPASE: 27 U/L (ref 11–51)

## 2018-02-23 MED ORDER — IOPAMIDOL (ISOVUE-300) INJECTION 61%
100.0000 mL | Freq: Once | INTRAVENOUS | Status: AC | PRN
  Administered 2018-02-23: 100 mL via INTRAVENOUS

## 2018-02-23 MED ORDER — ONDANSETRON HCL 4 MG/2ML IJ SOLN
4.0000 mg | Freq: Once | INTRAMUSCULAR | Status: AC
  Administered 2018-02-23: 4 mg via INTRAVENOUS
  Filled 2018-02-23: qty 2

## 2018-02-23 MED ORDER — ONDANSETRON 4 MG PO TBDP: 4.0000 mg | ORAL_TABLET | Freq: Three times a day (TID) | ORAL | 0 refills | Status: DC | PRN

## 2018-02-23 MED ORDER — SODIUM CHLORIDE 0.9 % IV BOLUS
1000.0000 mL | Freq: Once | INTRAVENOUS | Status: AC
  Administered 2018-02-23: 1000 mL via INTRAVENOUS

## 2018-02-23 MED ORDER — IOPAMIDOL (ISOVUE-300) INJECTION 61%
INTRAVENOUS | Status: AC
  Filled 2018-02-23: qty 100

## 2018-02-23 NOTE — ED Notes (Signed)
Bed: FM40 Expected date:  Expected time:  Means of arrival:  Comments: Triage 3

## 2018-02-23 NOTE — ED Provider Notes (Signed)
[facility] DEPT Provider Note   CSN: [id number] Arrival date & time: 02/23/18  1451     History   Chief Complaint Chief Complaint  Patient presents with  . Emesis  . cancer patient    HPI Shannon Ellis is a 84 y.o. female.  84 year old female with past medical history including ovarian cancer on chemotherapy, hypertension, hypothyroidism who presents with nausea, vomiting, diarrhea.  Today around 1:30 PM, she had a sudden onset of nausea and vomiting associated with nonbloody diarrhea.  She currently still feels nauseated.  She reports some soreness in her L lower abdomen. No fevers, cough/cold symptoms, urinary symptoms, or sick contacts. No medications PTA. She does note that she has recently been taking miralax for constipation. She had 1st dose of chemo 8 days ago. She still has stitches in place at umbilicus from biopsy.   The history is provided by the patient.  Emesis      Past Medical History:  Diagnosis Date  . Allergy   . Anemia   . Blood transfusion without reported diagnosis   . Cancer ([place])   . Complication of anesthesia    waking up-does not take much medication  . Family history of adverse reaction to anesthesia    problems waking up as does not need much medication  . Hypertension   . Hypothyroidism   . Osteoporosis 10/07   Dexa scan  . PONV (postoperative nausea and vomiting)     Patient Active Problem List   Diagnosis Date Noted  . Elevated alkaline phosphatase level 02/14/2018  . Pelvic mass in female 01/25/2018  . Ovarian cancer ([place]) 01/23/2018  . HTN (hypertension) 12/09/2015  . Hypothyroidism 12/09/2015    Past Surgical History:  Procedure Laterality Date  . APPENDECTOMY  1950   not ruptured  . IR IMAGING GUIDED PORT INSERTION  02/11/2018  . LAPAROSCOPY N/A 01/31/2018   Procedure: LAPAROSCOPY DIAGNOSTIC WITH BIOPSY;  Surgeon: [name], MD;  Location: [facility];  Service: Gynecology;  Laterality: N/A;    . ovarian biopsy    . THYROIDECTOMY    . TONSILLECTOMY    . TOTAL HIP ARTHROPLASTY Right      OB History   None      Home Medications    Prior to Admission medications   Medication Sig Start Date End Date Taking? Authorizing Provider  acetaminophen (TYLENOL) 500 MG tablet Take 500 mg by mouth every 6 (six) hours as needed for mild pain.   Yes [provider]  amLODipine (NORVASC) 5 MG tablet Take 5 mg by mouth daily at 6 PM.   Yes [provider]  dexamethasone (DECADRON) 4 MG tablet Take 3 tabs at the night before and 3 tab the morning of chemotherapy, every 3 weeks, by mouth 02/14/18  Yes Gorsuch, Ni, MD  ibuprofen (ADVIL,MOTRIN) 200 MG tablet Take 200 mg by mouth daily as needed. For pain    Yes [provider]  levothyroxine (SYNTHROID, LEVOTHROID) 88 MCG tablet Take 88 mcg by mouth daily before breakfast.   Yes [provider]  lidocaine-prilocaine (EMLA) cream Apply to affected area once 02/14/18  Yes Gorsuch, Ni, MD  loratadine (CLARITIN) 10 MG tablet Take 10 mg by mouth daily as needed for allergies (for congestion).    Yes [provider]  metoprolol succinate (TOPROL-XL) 50 MG 24 hr tablet Take 50 mg by mouth daily. In the morning 10/15/17  Yes [provider]  Multiple Vitamins-Minerals (MULTIVITAMINS THER. W/MINERALS) TABS Take  1 tablet by mouth daily.     Yes [provider]  olmesartan (BENICAR) 40 MG tablet Take 40 mg by mouth daily at 6 PM.    Yes [provider]  ondansetron (ZOFRAN) 8 MG tablet Take 1 tablet (8 mg total) by mouth every 8 (eight) hours as needed for refractory nausea / vomiting. Start on day 3 after chemo. 02/14/18  Yes [name], MD  prochlorperazine (COMPAZINE) 10 MG tablet Take 1 tablet (10 mg total) by mouth every 6 (six) hours as needed (Nausea or vomiting). 02/14/18  Yes Gorsuch, Ni, MD  ondansetron (ZOFRAN ODT) 4 MG disintegrating tablet Take 1 tablet (4 mg total) by mouth every  8 (eight) hours as needed for nausea or vomiting. 02/23/18   Varian Innes, Wenda Overland, MD    Family History Family History  Problem Relation Age of Onset  . Diabetes Mother   . Stroke Mother   . Breast cancer Sister 26       breast ca  . Diabetes Sister   . Lung cancer Brother   . Stroke Brother     Social History Social History   Tobacco Use  . Smoking status: Never Smoker  . Smokeless tobacco: Never Used  Substance Use Topics  . Alcohol use: No    Alcohol/week: 0.0 standard drinks  . Drug use: No     Allergies   Patient has no known allergies.   Review of Systems Review of Systems  Gastrointestinal: Positive for vomiting.   All other systems reviewed and are negative except that which was mentioned in HPI   Physical Exam Updated Vital Signs BP 128/69   Pulse 73   Temp 98.6 F (37 C) (Oral)   Resp 18   Ht 5\' 6"  (1.676 m)   Wt 75.4 kg   SpO2 96%   BMI 26.83 kg/m   Physical Exam  Constitutional: She is oriented to person, place, and time. She appears well-developed and well-nourished. No distress.  HENT:  Head: Normocephalic and atraumatic.  Dry mouth  Eyes: Pupils are equal, round, and reactive to light. Conjunctivae are normal.  Neck: Neck supple.  Cardiovascular: Normal rate, regular rhythm and normal heart sounds.  No murmur heard. Pulmonary/Chest: Effort normal and breath sounds normal.  Abdominal: Soft. Bowel sounds are normal. She exhibits no distension. There is tenderness (LLQ, RLQ). There is no rebound and no guarding.  Sutures in place at umbilicus, no erythema or drainage  Musculoskeletal: She exhibits no edema.  Neurological: She is alert and oriented to person, place, and time.  Fluent speech  Skin: Skin is warm and dry.  Psychiatric: She has a normal mood and affect. Judgment normal.  Nursing note and vitals reviewed.    ED Treatments / Results  Labs (all labs ordered are listed, but only abnormal results are displayed) Labs  Reviewed  COMPREHENSIVE METABOLIC PANEL - Abnormal; Notable for the following components:      Result Value   Glucose, Bld 122 (*)    Alkaline Phosphatase 231 (*)    All other components within normal limits  CBC - Abnormal; Notable for the following components:   WBC 1.9 (*)    Hemoglobin 11.7 (*)    HCT 35.3 (*)    All other components within normal limits  URINALYSIS, ROUTINE W REFLEX MICROSCOPIC - Abnormal; Notable for the following components:   APPearance HAZY (*)    Ketones, ur 5 (*)    All other components within normal limits  LIPASE,  BLOOD    EKG None  Radiology Ct Abdomen Pelvis W Contrast  Result Date: 02/23/2018 CLINICAL DATA:  Nausea and vomiting. EXAM: CT ABDOMEN AND PELVIS WITH CONTRAST TECHNIQUE: Multidetector CT imaging of the abdomen and pelvis was performed using the standard protocol following bolus administration of intravenous contrast. CONTRAST:  134mL ISOVUE-300 IOPAMIDOL (ISOVUE-300) INJECTION 61% COMPARISON:  01/21/2018 FINDINGS: Lower chest: No acute abnormality. Hepatobiliary: No focal liver abnormality is seen. No gallstones, gallbladder wall thickening, or biliary dilatation. Pancreas: Unremarkable. No pancreatic ductal dilatation or surrounding inflammatory changes. Spleen: Normal in size without focal abnormality. Adrenals/Urinary Tract: Mild right hydronephrosis and hydroureter. No evidence of obstructing calculi. Stomach/Bowel: Small hiatal hernia. Normal appearance of the stomach, small and large bowel. Vascular/Lymphatic: Calcific atherosclerotic disease of the aorta. Again seen is left periaortic lymph node. Soft tissue nodule adjacent to the cecum measures 2.3 cm, stable. Again seen is omental haziness. The index omental mass anterior to the gallbladder has greatly decreased in size. Reproductive: The large multilobular left paramedial pelvic mass measures 1 5 by 8.4 cm. Again seen is associated cystic component and adjacent loculated fluid. Other:  Interval development of abdominal ascites. Musculoskeletal: No suspicious osseous lesions. Spondylosis of the lumbosacral spine. Post right hip arthroplasty. IMPRESSION: Interval development of abdominal ascites. 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 No significant change in multilobulated complex left pelvic mass, which compresses the pelvic structures and displaces them to the right. Interval development of mild right hydronephrosis and hydroureter, possibly postobstructive. Electronically Signed   By: Fidela Salisbury M.D.   On: 02/23/2018 19:27    Procedures Procedures (including critical care time)  Medications Ordered in ED Medications  iopamidol (ISOVUE-300) 61 % injection (has no administration in time range)  sodium chloride 0.9 % bolus 1,000 mL (0 mLs Intravenous Stopped 02/23/18 1808)  ondansetron (ZOFRAN) injection 4 mg (4 mg Intravenous Given 02/23/18 1725)  iopamidol (ISOVUE-300) 61 % injection 100 mL (100 mLs Intravenous Contrast Given 02/23/18 1825)     Initial Impression / Assessment and Plan / ED Course  I have reviewed the triage vital signs and the nursing notes.  Pertinent labs & imaging results that were available during my care of the patient were reviewed by me and considered in my medical decision making (see chart for details).     Nontoxic on exam, reassuring vital signs.  She had a mild left lower quadrant and right lower quadrant tenderness on exam.  Obtained above labs.  Differential includes gastroenteritis, side effects from chemotherapy, or bowel obstruction.  Obtained CT to evaluate for bowel obstruction.   Lab work shows normal LFTs and lipase, normal creatinine, WBC 1.9, hemoglobin 11.7, platelets normal.  CT shows no evidence of bowel obstruction.  She has some abdominal ascites, stable size of known pelvic mass and some mild hydronephrosis and hydroureter.  Given the sudden onset of her symptoms with both vomiting and diarrhea, I  feel it is possible that she has a self-limited gastrointestinal illness.  After 1 dose of Zofran, she felt much better was able to drink Sprite in the ED with no problems.  She remains well-appearing on reassessment and wants to go home.  I have counseled the patient on supportive measures and provided Zofran to use as needed, and instructed to follow-up with her outpatient providers this week.  Also discussed her neutropenia and precautions regarding this.  She did family voiced understanding of return precautions. Final Clinical Impressions(s) / ED Diagnoses   Final diagnoses:  Nausea vomiting  and diarrhea  Chemotherapy-induced neutropenia [facility])    ED Discharge Orders         Ordered    ondansetron (ZOFRAN ODT) 4 MG disintegrating tablet  Every 8 hours PRN     02/23/18 2035           Dannica Bickham, Wenda Overland, MD 02/23/18 2037

## 2018-02-23 NOTE — ED Triage Notes (Signed)
Patient c/o N/V. Patient began having numerous episodes vomiting at 1330 today. Patient has ovarian cancer and had her first chemo treatment 8 days ago.

## 2018-02-25 ENCOUNTER — Encounter (HOSPITAL_COMMUNITY): Payer: Self-pay

## 2018-02-25 ENCOUNTER — Other Ambulatory Visit: Payer: Self-pay | Admitting: Hematology and Oncology

## 2018-02-26 ENCOUNTER — Encounter: Payer: Self-pay | Admitting: Gynecology

## 2018-02-26 NOTE — Progress Notes (Addendum)
Consult Note: Gyn-Onc In addition, the patient did have a small separation and leakage from her umbilical incision.  After preparation with Betadine and local anesthesia 1% Xylocaine.  2 interrupted sutures were placed closing the incision completely.  Patient tolerated the procedure well.  Shannon Ellis 84 y.o. female  Chief Complaint  Patient presents with  . Malignant neoplasm of ovary, unspecified laterality ([place])    Assessment : Advanced ovarian cancer review of pathology at [employer] indicates this is an epithelial cancer.  Patient is having a good postoperative recovery.  Plan: Pathology is reviewed with the patient and her family.  She is recovering well from surgery and we will arrange for her to initiate chemotherapy in the near future.  We once again reviewed the fact that she has very extensive disease..  Interval History: Patient returns today having recently undergone laparoscopy with biopsy.  Pathology is been reviewed at [facility] and its their opinion this is a primary epithelial malignancy consistent with an advanced ovarian cancer.  The patient had an unconjugated postoperative course and has no complaints. Review of Systems:10 point review of systems is negative except as noted in interval history.   Vitals: Blood pressure (!) 156/68, pulse 67, temperature 98 F (36.7 C), temperature source Oral, resp. rate 20, height 5\' 6"  (1.676 m), weight 166 lb 8 oz (75.5 kg), SpO2 97 %.  Physical Exam: General : The patient is a healthy woman in no acute distress.  Abdomen, soft nontender no masses organomegaly or ascites are noted.  Incisions are healing well.  Lower extremities without edema or varicosities.      No Known Allergies  Past Medical History:  Diagnosis Date  . Allergy   . Anemia   . Blood transfusion without reported diagnosis   . Cancer ([place])   . Complication of anesthesia    waking up-does not take much medication  .  Family history of adverse reaction to anesthesia    problems waking up as does not need much medication  . Hypertension   . Hypothyroidism   . Osteoporosis 10/07   Dexa scan  . PONV (postoperative nausea and vomiting)     Past Surgical History:  Procedure Laterality Date  . APPENDECTOMY  1950   not ruptured  . IR IMAGING GUIDED PORT INSERTION  02/11/2018  . LAPAROSCOPY N/A 01/31/2018   Procedure: LAPAROSCOPY DIAGNOSTIC WITH BIOPSY;  Surgeon: [name], MD;  Location: [facility];  Service: Gynecology;  Laterality: N/A;  . ovarian biopsy    . THYROIDECTOMY    . TONSILLECTOMY    . TOTAL HIP ARTHROPLASTY Right     Current Outpatient Medications  Medication Sig Dispense Refill  . amLODipine (NORVASC) 5 MG tablet Take 5 mg by mouth daily at 6 PM.    . ibuprofen (ADVIL,MOTRIN) 200 MG tablet Take 200 mg by mouth daily as needed. For pain     . levothyroxine (SYNTHROID, LEVOTHROID) 88 MCG tablet Take 88 mcg by mouth daily before breakfast.    . loratadine (CLARITIN) 10 MG tablet Take 10 mg by mouth daily as needed for allergies (for congestion).     . metoprolol succinate (TOPROL-XL) 50 MG 24 hr tablet Take 50 mg by mouth daily. In the morning    . Multiple Vitamins-Minerals (MULTIVITAMINS THER. W/MINERALS) TABS Take 1 tablet by mouth daily.      Marland Kitchen olmesartan (BENICAR) 40 MG tablet Take 40 mg by mouth daily at 6 PM.     .  acetaminophen (TYLENOL) 500 MG tablet Take 500 mg by mouth every 6 (six) hours as needed for mild pain.    Marland Kitchen dexamethasone (DECADRON) 4 MG tablet Take 3 tabs at the night before and 3 tab the morning of chemotherapy, every 3 weeks, by mouth 36 tablet 0  . lidocaine-prilocaine (EMLA) cream Apply to affected area once 30 g 3  . ondansetron (ZOFRAN ODT) 4 MG disintegrating tablet Take 1 tablet (4 mg total) by mouth every 8 (eight) hours as needed for nausea or vomiting. 10 tablet 0  . ondansetron (ZOFRAN) 8 MG tablet Take 1 tablet (8 mg total) by mouth every 8 (eight) hours  as needed for refractory nausea / vomiting. Start on day 3 after chemo. 30 tablet 1  . prochlorperazine (COMPAZINE) 10 MG tablet Take 1 tablet (10 mg total) by mouth every 6 (six) hours as needed (Nausea or vomiting). 30 tablet 1   No current facility-administered medications for this visit.     Social History   Socioeconomic History  . Marital status: Single    Spouse name: Not on file  . Number of children: 4  . Years of education: Not on file  . Highest education level: Not on file  Occupational History  . Occupation: retired  Scientific laboratory technician  . Financial resource strain: Not on file  . Food insecurity:    Worry: Not on file    Inability: Not on file  . Transportation needs:    Medical: Not on file    Non-medical: Not on file  Tobacco Use  . Smoking status: Never Smoker  . Smokeless tobacco: Never Used  Substance and Sexual Activity  . Alcohol use: No    Alcohol/week: 0.0 standard drinks  . Drug use: No  . Sexual activity: Not on file  Lifestyle  . Physical activity:    Days per week: Not on file    Minutes per session: Not on file  . Stress: Not on file  Relationships  . Social connections:    Talks on phone: Not on file    Gets together: Not on file    Attends religious service: Not on file    Active member of club or organization: Not on file    Attends meetings of clubs or organizations: Not on file    Relationship status: Not on file  . Intimate partner violence:    Fear of current or ex partner: Not on file    Emotionally abused: Not on file    Physically abused: Not on file    Forced sexual activity: Not on file  Other Topics Concern  . Not on file  Social History Narrative  . Not on file    Family History  Problem Relation Age of Onset  . Diabetes Mother   . Stroke Mother   . Breast cancer Sister 55       breast ca  . Diabetes Sister   . Lung cancer Brother   . Stroke Brother       Marti Sleigh, MD 02/26/2018, 9:34 AM

## 2018-02-27 ENCOUNTER — Telehealth: Payer: Self-pay | Admitting: *Deleted

## 2018-02-27 NOTE — Telephone Encounter (Signed)
I called patient to see if her stitches were giving her any problems.  Patient states " I am not having any irritation, or redness, and they are not draining, they do itch a little".  I told patient to give our office a call if she has any issues with her stitches, otherwise we will see her at her appointment on Tuesday, September 17th.  Patient verbalized understanding.

## 2018-03-05 ENCOUNTER — Inpatient Hospital Stay (HOSPITAL_BASED_OUTPATIENT_CLINIC_OR_DEPARTMENT_OTHER): Payer: Medicare Other | Admitting: Hematology and Oncology

## 2018-03-05 ENCOUNTER — Inpatient Hospital Stay: Payer: Medicare Other | Attending: Obstetrics

## 2018-03-05 ENCOUNTER — Encounter: Payer: Self-pay | Admitting: Hematology and Oncology

## 2018-03-05 VITALS — BP 139/68 | HR 73 | Temp 97.8°F | Resp 18 | Ht 66.0 in | Wt 165.6 lb

## 2018-03-05 DIAGNOSIS — C786 Secondary malignant neoplasm of retroperitoneum and peritoneum: Secondary | ICD-10-CM | POA: Insufficient documentation

## 2018-03-05 DIAGNOSIS — R110 Nausea: Secondary | ICD-10-CM | POA: Insufficient documentation

## 2018-03-05 DIAGNOSIS — G620 Drug-induced polyneuropathy: Secondary | ICD-10-CM

## 2018-03-05 DIAGNOSIS — R971 Elevated cancer antigen 125 [CA 125]: Secondary | ICD-10-CM

## 2018-03-05 DIAGNOSIS — Z4889 Encounter for other specified surgical aftercare: Secondary | ICD-10-CM | POA: Insufficient documentation

## 2018-03-05 DIAGNOSIS — C562 Malignant neoplasm of left ovary: Secondary | ICD-10-CM

## 2018-03-05 DIAGNOSIS — Z5111 Encounter for antineoplastic chemotherapy: Secondary | ICD-10-CM | POA: Diagnosis not present

## 2018-03-05 DIAGNOSIS — Z23 Encounter for immunization: Secondary | ICD-10-CM | POA: Diagnosis not present

## 2018-03-05 DIAGNOSIS — T451X5A Adverse effect of antineoplastic and immunosuppressive drugs, initial encounter: Secondary | ICD-10-CM | POA: Insufficient documentation

## 2018-03-05 DIAGNOSIS — F411 Generalized anxiety disorder: Secondary | ICD-10-CM | POA: Insufficient documentation

## 2018-03-05 DIAGNOSIS — R748 Abnormal levels of other serum enzymes: Secondary | ICD-10-CM

## 2018-03-05 DIAGNOSIS — M898X9 Other specified disorders of bone, unspecified site: Secondary | ICD-10-CM

## 2018-03-05 LAB — COMPREHENSIVE METABOLIC PANEL
ALBUMIN: 3.4 g/dL — AB (ref 3.5–5.0)
ALK PHOS: 245 U/L — AB (ref 38–126)
ALT: 15 U/L (ref 0–44)
ANION GAP: 7 (ref 5–15)
AST: 23 U/L (ref 15–41)
BUN: 13 mg/dL (ref 8–23)
CALCIUM: 9.5 mg/dL (ref 8.9–10.3)
CHLORIDE: 104 mmol/L (ref 98–111)
CO2: 26 mmol/L (ref 22–32)
Creatinine, Ser: 0.8 mg/dL (ref 0.44–1.00)
GFR calc Af Amer: >60 mL/min (ref >60)
GFR calc non Af Amer: >60 mL/min (ref >60)
GLUCOSE: 133 mg/dL — AB (ref 70–99)
Potassium: 4.4 mmol/L (ref 3.5–5.1)
SODIUM: 137 mmol/L (ref 135–145)
Total Bilirubin: 0.3 mg/dL (ref 0.3–1.2)
Total Protein: 7.3 g/dL (ref 6.5–8.1)

## 2018-03-05 LAB — CBC WITH DIFFERENTIAL/PLATELET
BASOS PCT: 2 %
Basophils Absolute: 0.1 10*3/uL (ref 0.0–0.1)
EOS ABS: 0.2 10*3/uL (ref 0.0–0.5)
EOS PCT: 4 %
HCT: 37.4 % (ref 34.8–46.6)
HEMOGLOBIN: 12 g/dL (ref 11.6–15.9)
Lymphocytes Relative: 23 %
Lymphs Abs: 1.3 10*3/uL (ref 0.9–3.3)
MCH: 28.4 pg (ref 25.1–34.0)
MCHC: 32.1 g/dL (ref 31.5–36.0)
MCV: 88.6 fL (ref 79.5–101.0)
MONOS PCT: 15 %
Monocytes Absolute: 0.9 10*3/uL (ref 0.1–0.9)
NEUTROS PCT: 56 %
Neutro Abs: 3.2 10*3/uL (ref 1.5–6.5)
PLATELETS: 368 10*3/uL (ref 145–400)
RBC: 4.22 MIL/uL (ref 3.70–5.45)
RDW: 14.3 % (ref 11.2–14.5)
WBC: 5.8 10*3/uL (ref 3.9–10.3)

## 2018-03-05 MED ORDER — LORAZEPAM 0.5 MG PO TABS: 0.5000 mg | ORAL_TABLET | Freq: Two times a day (BID) | ORAL | 0 refills | Status: DC | PRN

## 2018-03-05 MED ORDER — INFLUENZA VAC SPLIT HIGH-DOSE 0.5 ML IM SUSY
0.5000 mL | PREFILLED_SYRINGE | Freq: Once | INTRAMUSCULAR | Status: AC
  Administered 2018-03-05: 0.5 mL via INTRAMUSCULAR
  Filled 2018-03-05: qty 0.5

## 2018-03-05 MED ORDER — ONDANSETRON 4 MG PO TBDP: 4.0000 mg | ORAL_TABLET | Freq: Three times a day (TID) | ORAL | 1 refills | Status: DC | PRN

## 2018-03-05 MED FILL — ONDANSETRON ODT 4 MG TABLET: 4 | 6 days supply | Qty: 20 | Fill #0

## 2018-03-05 MED FILL — LORazepam 0.5 MG TABS: 0.5 | 15 days supply | Qty: 30 | Fill #0

## 2018-03-05 NOTE — Assessment & Plan Note (Signed)
Her incision site is healing well I removed the sutures today.

## 2018-03-05 NOTE — Progress Notes (Signed)
[facility] OFFICE PROGRESS NOTE  Patient Care Team: Marton Redwood, [name]  ASSESSMENT & PLAN:  Ovarian cancer [facility]) She tolerated cycle 1 poorly due to peripheral neuropathy and nausea She continues to have abdominal bloating I recommend dose adjustment of Taxol Recommend influenza vaccination  Peripheral neuropathy due to chemotherapy [facility]) she has mild peripheral neuropathy, likely related to side effects of treatment. I plan to reduce the dose of treatment as outlined above.  I explained to the patient the rationale of this strategy and reassured the patient it would not compromise the efficacy of treatment   Elevated alkaline phosphatase level This is likely related to recent surgery Observe only.  She is not symptomatic  Suture check Her incision site is healing well I removed the sutures today.  Chemotherapy-induced nausea We discussed the use of regular antiemetics. She did not tolerate high-dose dexamethasone and I recommend reduced dose oral dexamethasone I refilled her prescription ODT Zofran I also gave her low-dose lorazepam to take as needed  Bone pain She has diffuse bone pain after treatment Hopefully, with reduced dose Taxol, she will get less symptoms I recommend extra strength Tylenol around-the-clock for few days after treatment to reduce her side effects.   No orders of the defined types were placed in this encounter.   INTERVAL HISTORY: Please see below for problem oriented charting. She returns with her daughter to be seen prior to cycle 2 of therapy With cycle 1, she developed significant side effects with diffuse bone pain, peripheral neuropathy affecting her hands and feet and nausea She denies further drainage from her abdominal wall and requested her suture to be removed She went to the emergency department a week after treatment and had CT imaging which showed no evidence of bowel  obstruction She denies recent fever or chills With a bone pain, she was only taking Tylenol and Advil as needed She denies further nausea and vomiting after her emergency room visit She does sleep poorly and felt that the high dose dexamethasone is causing some internal restlessness.  SUMMARY OF ONCOLOGIC HISTORY:   Ovarian cancer ([place])   01/02/2018 Imaging    1. Large complex left paramidline pelvic mass likely rises from the left ovary. Together with left periaortic adenopathy and peritoneal carcinomatosis, findings are worrisome for metastatic ovarian cancer. 2. Small pelvic free fluid. 3.  Aortic atherosclerosis (ICD10-170.0).    01/21/2018 Pathology Results    Omentum, biopsy, dominant omental nodule within the right lower abdominal quadrant - POORLY DIFFERENTIATED CARCINOMA CONSISTENT WITH GYNECOLOGIC PRIMARY. - SEE MICROSCOPIC DESCRIPTION. Microscopic Comment The tumor is characterized by sheets of tumor cells with enlarged nuclei, many containing prominent nucleoli. There are patchy areas with eosinophilic cytoplasmic inclusions. Immunohistochemistry shows positivity with cytokeratin AE1/AE3, cytokeratin 7, estrogen receptor, progesterone receptor, PAX-8, WT-1, and placental alkaline phosphatase (PLAP). The tumor is negative with alpha fetoprotein, human chorionic gonadotropin, CD117, CD10, CD30, CD56, CDX-2, CEA, cytokeratin 20, S100, SOX-10, synaptophysin, chromogranin, D2-40, GATA-3, GCDFP, Glypican 3 and Inhibin. 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 The estrogen receptor is 100% with strong staining and the progesterone receptor is 90% with strong staining.    01/21/2018 Procedure    Technically successful CT guided core needle biopsy of dominant omental nodule within the right lower abdominal quadrant, adjacent to the cecum.    01/24/2018 Cancer Staging    Staging form: Ovary, Fallopian Tube, and Primary Peritoneal  Carcinoma, AJCC 8th  Edition - Clinical: Stage IIIC (cT3c, cN1b, cM0) - Signed by [name], MD on 01/24/2018    01/25/2018 Tumor Marker    Patient's tumor was tested for the following markers: AFP Results of the tumor marker test revealed 4.7    01/31/2018 Surgery    Pre-operative Diagnosis: Ovarian cancer NOS  Post-operative Diagnosis: At least Stage 3 ovarian cancer   Operation: Laparoscopic omental biopsy  Surgeon: Mart Piggs, MD  Specimens: Omentum  Operative Findings: Omental nodule >2cm. Large left pelvic mass 12-15cm with sigmoid draped medially and potentially adherent to colon. The mass appeared to have a 3cm cystic area that was ruptured preoperatively. Uterus appears normal with small anterior fibroid ~1cm. Right adnexa nonenlarged. Small bowel without evidence of disease. No diaphragmatic disease.     01/31/2018 Pathology Results    Omentum, resection for tumor - HIGH GRADE CARCINOMA. - SEE MICROSCOPIC DESCRIPTION. Microscopic Comment This case is sent to Dr. Annitta Jersey for consultation. The case was discussed with Dr. Gerarda Fraction on 02/01/18. (JDP:kh 02/01/18) The omentum shows nodules of high grade carcinoma characterized by diffuse sheets of malignant cells with enlarged nuclei with prominent nucleoli. There is moderate to abundant eosinophilic cytoplasm and frequent mitotic figures. 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 The entire specimen is examined histologically and there are nodules of tumor throughout the specimen with identical morphology. Additional immunohistochemistry for hepatoid markers will be performed and reported as an addendum    02/11/2018 Procedure    Status post right IJ  port catheter placement. Catheter ready for use.    02/14/2018 Tumor Marker    Patient's tumor was tested for the following markers: CA-125 Results of the tumor marker test revealed 1523    02/15/2018 -  Chemotherapy    The patient had carboplatin and taxol. Taxol dose reduced from cycle 2 onwards    02/23/2018 Imaging    Interval development of abdominal ascites.  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  No significant change in multilobulated complex left pelvic mass, which compresses the pelvic structures and displaces them to the right.  Interval development of mild right hydronephrosis and hydroureter, possibly postobstructive.     REVIEW OF SYSTEMS:   Constitutional: Denies fevers, chills or abnormal weight loss Eyes: Denies blurriness of vision Ears, nose, mouth, throat, and face: Denies mucositis or sore throat Respiratory: Denies cough, dyspnea or wheezes Cardiovascular: Denies palpitation, chest discomfort or lower extremity swelling Skin: Denies abnormal skin rashes Lymphatics: Denies new lymphadenopathy or easy bruising Behavioral/Psych: Mood is stable, no new changes  All other systems were reviewed with the patient and are negative.  I have reviewed the past medical history, past surgical history, social history and family history with the patient and they are unchanged from previous note.  ALLERGIES:  has No Known Allergies.  MEDICATIONS:  Current Outpatient Medications  Medication Sig Dispense Refill  . acetaminophen (TYLENOL) 500 MG tablet Take 500 mg by mouth every 6 (six) hours as needed for mild pain.    Marland Kitchen amLODipine (NORVASC) 5 MG tablet Take 5 mg by mouth daily at 6 PM.    . dexamethasone (DECADRON) 4 MG tablet Take 3 tabs at the night before and 3 tab the morning of chemotherapy, every 3  weeks, by mouth 36 tablet 0  . ibuprofen (ADVIL,MOTRIN) 200 MG tablet Take 200 mg by mouth daily as needed. For pain     . levothyroxine  (SYNTHROID, LEVOTHROID) 88 MCG tablet Take 88 mcg by mouth daily before breakfast.    . lidocaine-prilocaine (EMLA) cream Apply to affected area once 30 g 3  . loratadine (CLARITIN) 10 MG tablet Take 10 mg by mouth daily as needed for allergies (for congestion).     . LORazepam (ATIVAN) 0.5 MG tablet Take 1 tablet (0.5 mg total) by mouth 2 (two) times daily as needed for anxiety or sedation. 30 tablet 0  . metoprolol succinate (TOPROL-XL) 50 MG 24 hr tablet Take 50 mg by mouth daily. In the morning    . Multiple Vitamins-Minerals (MULTIVITAMINS THER. W/MINERALS) TABS Take 1 tablet by mouth daily.      Marland Kitchen olmesartan (BENICAR) 40 MG tablet Take 40 mg by mouth daily at 6 PM.     . ondansetron (ZOFRAN ODT) 4 MG disintegrating tablet Take 1 tablet (4 mg total) by mouth every 8 (eight) hours as needed for nausea or vomiting. 60 tablet 1  . ondansetron (ZOFRAN) 8 MG tablet Take 1 tablet (8 mg total) by mouth every 8 (eight) hours as needed for refractory nausea / vomiting. Start on day 3 after chemo. 30 tablet 1  . prochlorperazine (COMPAZINE) 10 MG tablet Take 1 tablet (10 mg total) by mouth every 6 (six) hours as needed (Nausea or vomiting). 30 tablet 1   No current facility-administered medications for this visit.     PHYSICAL EXAMINATION: ECOG PERFORMANCE STATUS: 2 - Symptomatic, <50% confined to bed  Vitals:   03/05/18 1045  BP: 139/68  Pulse: 73  Resp: 18  Temp: 97.8 F (36.6 C)  SpO2: 98%   Filed Weights   03/05/18 1045  Weight: 165 lb 9.6 oz (75.1 kg)    GENERAL:alert, no distress and comfortable SKIN: skin color, texture, turgor are normal, no rashes or significant lesions EYES: normal, Conjunctiva are pink and non-injected, sclera clear OROPHARYNX:no exudate, no erythema and lips, buccal mucosa, and tongue normal  NECK: supple, thyroid normal size, non-tender, without nodularity LYMPH:  no palpable lymphadenopathy in the cervical, axillary or inguinal LUNGS: clear to  auscultation and percussion with normal breathing effort HEART: regular rate & rhythm and no murmurs and no lower extremity edema ABDOMEN:abdomen soft, with probable peritoneal disease.  I removed her suture per patient request Musculoskeletal:no cyanosis of digits and no clubbing  NEURO: alert & oriented x 3 with fluent speech, no focal motor/sensory deficits  LABORATORY DATA:  I have reviewed the data as listed    Component Value Date/Time   NA 137 03/05/2018 1016   K 4.4 03/05/2018 1016   CL 104 03/05/2018 1016   CO2 26 03/05/2018 1016   GLUCOSE 133 (H) 03/05/2018 1016   BUN 13 03/05/2018 1016   CREATININE 0.80 03/05/2018 1016   CALCIUM 9.5 03/05/2018 1016   PROT 7.3 03/05/2018 1016   ALBUMIN 3.4 (L) 03/05/2018 1016   AST 23 03/05/2018 1016   ALT 15 03/05/2018 1016   ALKPHOS 245 (H) 03/05/2018 1016   BILITOT 0.3 03/05/2018 1016   GFRNONAA >60 03/05/2018 1016   GFRAA >60 03/05/2018 1016    No results found for: SPEP, UPEP  Lab Results  Component Value Date   WBC 5.8 03/05/2018   NEUTROABS 3.2 03/05/2018   HGB 12.0 03/05/2018   HCT 37.4 03/05/2018   MCV 88.6 03/05/2018  PLT 368 03/05/2018      Chemistry      Component Value Date/Time   NA 137 03/05/2018 1016   K 4.4 03/05/2018 1016   CL 104 03/05/2018 1016   CO2 26 03/05/2018 1016   BUN 13 03/05/2018 1016   CREATININE 0.80 03/05/2018 1016      Component Value Date/Time   CALCIUM 9.5 03/05/2018 1016   ALKPHOS 245 (H) 03/05/2018 1016   AST 23 03/05/2018 1016   ALT 15 03/05/2018 1016   BILITOT 0.3 03/05/2018 1016       RADIOGRAPHIC STUDIES: I have personally reviewed the radiological images as listed and agreed with the findings in the report. Ct Abdomen Pelvis W Contrast  Result Date: 02/23/2018 CLINICAL DATA:  Nausea and vomiting. EXAM: CT ABDOMEN AND PELVIS WITH CONTRAST TECHNIQUE: Multidetector CT imaging of the abdomen and pelvis was performed using the standard protocol following bolus  administration of intravenous contrast. CONTRAST:  159m ISOVUE-300 IOPAMIDOL (ISOVUE-300) INJECTION 61% COMPARISON:  01/21/2018 FINDINGS: Lower chest: No acute abnormality. Hepatobiliary: No focal liver abnormality is seen. No gallstones, gallbladder wall thickening, or biliary dilatation. Pancreas: Unremarkable. No pancreatic ductal dilatation or surrounding inflammatory changes. Spleen: Normal in size without focal abnormality. Adrenals/Urinary Tract: Mild right hydronephrosis and hydroureter. No evidence of obstructing calculi. Stomach/Bowel: Small hiatal hernia. Normal appearance of the stomach, small and large bowel. Vascular/Lymphatic: Calcific atherosclerotic disease of the aorta. Again seen is left periaortic lymph node. Soft tissue nodule adjacent to the cecum measures 2.3 cm, stable. Again seen is omental haziness. The index omental mass anterior to the gallbladder has greatly decreased in size. Reproductive: The large multilobular left paramedial pelvic mass measures 1 5 by 8.4 cm. Again seen is associated cystic component and adjacent loculated fluid. Other: Interval development of abdominal ascites. Musculoskeletal: No suspicious osseous lesions. Spondylosis of the lumbosacral spine. Post right hip arthroplasty. IMPRESSION: Interval development of abdominal ascites. 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 No significant change in multilobulated complex left pelvic mass, which compresses the pelvic structures and displaces them to the right. Interval development of mild right hydronephrosis and hydroureter, possibly postobstructive. Electronically Signed   By: DFidela SalisburyM.D.   On: 02/23/2018 19:27   Ir Imaging Guided Port Insertion  Result Date: 02/11/2018 INDICATION: 84year old female with a history ovarian malignancy. She has been referred for port catheter placement. EXAM: IMPLANTED PORT A CATH PLACEMENT WITH ULTRASOUND AND FLUOROSCOPIC GUIDANCE MEDICATIONS: 2.0  g Ancef; The antibiotic was administered within an appropriate time interval prior to skin puncture. ANESTHESIA/SEDATION: Moderate (conscious) sedation was employed during this procedure. A total of Versed 1.5 mg and Fentanyl 75 mcg was administered intravenously. Moderate Sedation Time: 23 minutes. The patient's level of consciousness and vital signs were monitored continuously by radiology nursing throughout the procedure under my direct supervision. FLUOROSCOPY TIME:  0 minutes, 6 seconds (1 mGy) COMPLICATIONS: None PROCEDURE: The procedure, risks, benefits, and alternatives were explained to the patient. Questions regarding the procedure were encouraged and answered. The patient understands and consents to the procedure. Ultrasound survey was performed with images stored and sent to PACs. The right neck and chest was prepped with chlorhexidine, and draped in the usual sterile fashion using maximum barrier technique (cap and mask, sterile gown, sterile gloves, large sterile sheet, hand hygiene and cutaneous antiseptic). Antibiotic prophylaxis was provided with 2.0g Ancef administered IV one hour prior to skin incision. Local anesthesia was attained by infiltration with 1% lidocaine without epinephrine. Ultrasound demonstrated patency of the  right internal jugular vein, and this was documented with an image. Under real-time ultrasound guidance, this vein was accessed with a 21 gauge micropuncture needle and image documentation was performed. A small dermatotomy was made at the access site with an 11 scalpel. A 0.018" wire was advanced into the SVC and used to estimate the length of the internal catheter. The access needle exchanged for a 73F micropuncture vascular sheath. The 0.018" wire was then removed and a 0.035" wire advanced into the IVC. An appropriate location for the subcutaneous reservoir was selected below the clavicle and an incision was made through the skin and underlying soft tissues. The  subcutaneous tissues were then dissected using a combination of blunt and sharp surgical technique and a pocket was formed. A single lumen power injectable portacatheter was then tunneled through the subcutaneous tissues from the pocket to the dermatotomy and the port reservoir placed within the subcutaneous pocket. The venous access site was then serially dilated and a peel away vascular sheath placed over the wire. The wire was removed and the port catheter advanced into position under fluoroscopic guidance. The catheter tip is positioned in the cavoatrial junction. This was documented with a spot image. The portacatheter was then tested and found to flush and aspirate well. The port was flushed with saline followed by 100 units/mL heparinized saline. The pocket was then closed in two layers using first subdermal inverted interrupted absorbable sutures followed by a running subcuticular suture. The epidermis was then sealed with Dermabond. The dermatotomy at the venous access site was also seal with Dermabond. Patient tolerated the procedure well and remained hemodynamically stable throughout. No complications encountered and no significant blood loss encountered IMPRESSION: Status post right IJ port catheter placement. Catheter ready for use. Signed, Dulcy Fanny. Dellia Nims, RPVI [facility] Electronically Signed   By: Corrie Mckusick D.O.   On: 02/11/2018 11:43    All questions were answered. The patient knows to call the clinic with any problems, questions or concerns. No barriers to learning was detected.  I spent 30 minutes counseling the patient face to face. The total time spent in the appointment was 40 minutes and more than 50% was on counseling and review of test results  [name], MD 03/05/2018 3:14 PM

## 2018-03-05 NOTE — Assessment & Plan Note (Signed)
She has diffuse bone pain after treatment Hopefully, with reduced dose Taxol, she will get less symptoms I recommend extra strength Tylenol around-the-clock for few days after treatment to reduce her side effects.

## 2018-03-05 NOTE — Assessment & Plan Note (Signed)
This is likely related to recent surgery Observe only.  She is not symptomatic

## 2018-03-05 NOTE — Assessment & Plan Note (Signed)
she has mild peripheral neuropathy, likely related to side effects of treatment. °I plan to reduce the dose of treatment as outlined above.  °I explained to the patient the rationale of this strategy and reassured the patient it would not compromise the efficacy of treatment ° °

## 2018-03-05 NOTE — Assessment & Plan Note (Addendum)
We discussed the use of regular antiemetics. She did not tolerate high-dose dexamethasone and I recommend reduced dose oral dexamethasone I refilled her prescription ODT Zofran I also gave her low-dose lorazepam to take as needed

## 2018-03-05 NOTE — Patient Instructions (Signed)

## 2018-03-05 NOTE — Assessment & Plan Note (Signed)
She tolerated cycle 1 poorly due to peripheral neuropathy and nausea She continues to have abdominal bloating I recommend dose adjustment of Taxol Recommend influenza vaccination

## 2018-03-06 LAB — CA 125: CANCER ANTIGEN (CA) 125: 1844 U/mL — AB (ref 0.0–38.1)

## 2018-03-08 ENCOUNTER — Telehealth: Payer: Self-pay | Admitting: Gynecologic Oncology

## 2018-03-08 ENCOUNTER — Inpatient Hospital Stay: Payer: Medicare Other

## 2018-03-08 VITALS — BP 171/98 | HR 93 | Temp 98.0°F | Resp 18

## 2018-03-08 DIAGNOSIS — T451X5A Adverse effect of antineoplastic and immunosuppressive drugs, initial encounter: Secondary | ICD-10-CM | POA: Diagnosis not present

## 2018-03-08 DIAGNOSIS — C786 Secondary malignant neoplasm of retroperitoneum and peritoneum: Secondary | ICD-10-CM | POA: Diagnosis not present

## 2018-03-08 DIAGNOSIS — C562 Malignant neoplasm of left ovary: Secondary | ICD-10-CM | POA: Diagnosis not present

## 2018-03-08 DIAGNOSIS — Z5111 Encounter for antineoplastic chemotherapy: Secondary | ICD-10-CM | POA: Diagnosis not present

## 2018-03-08 DIAGNOSIS — G620 Drug-induced polyneuropathy: Secondary | ICD-10-CM | POA: Diagnosis not present

## 2018-03-08 DIAGNOSIS — R748 Abnormal levels of other serum enzymes: Secondary | ICD-10-CM | POA: Diagnosis not present

## 2018-03-08 MED ORDER — DIPHENHYDRAMINE HCL 50 MG/ML IJ SOLN
INTRAMUSCULAR | Status: AC
  Filled 2018-03-08: qty 1

## 2018-03-08 MED ORDER — SODIUM CHLORIDE 0.9 % IV SOLN
Freq: Once | INTRAVENOUS | Status: AC
  Administered 2018-03-08: 08:00:00 via INTRAVENOUS
  Filled 2018-03-08: qty 250

## 2018-03-08 MED ORDER — DIPHENHYDRAMINE HCL 50 MG/ML IJ SOLN
25.0000 mg | Freq: Once | INTRAMUSCULAR | Status: AC
  Administered 2018-03-08: 25 mg via INTRAVENOUS

## 2018-03-08 MED ORDER — SODIUM CHLORIDE 0.9 % IV SOLN
448.8000 mg | Freq: Once | INTRAVENOUS | Status: AC
  Administered 2018-03-08: 450 mg via INTRAVENOUS
  Filled 2018-03-08: qty 45

## 2018-03-08 MED ORDER — SODIUM CHLORIDE 0.9 % IV SOLN
Freq: Once | INTRAVENOUS | Status: AC
  Administered 2018-03-08: 09:00:00 via INTRAVENOUS
  Filled 2018-03-08: qty 5

## 2018-03-08 MED ORDER — FAMOTIDINE IN NACL 20-0.9 MG/50ML-% IV SOLN
INTRAVENOUS | Status: AC
  Filled 2018-03-08: qty 50

## 2018-03-08 MED ORDER — SODIUM CHLORIDE 0.9 % IV SOLN
131.2500 mg/m2 | Freq: Once | INTRAVENOUS | Status: AC
  Administered 2018-03-08: 246 mg via INTRAVENOUS
  Filled 2018-03-08: qty 41

## 2018-03-08 MED ORDER — FAMOTIDINE IN NACL 20-0.9 MG/50ML-% IV SOLN
20.0000 mg | Freq: Once | INTRAVENOUS | Status: AC
  Administered 2018-03-08: 20 mg via INTRAVENOUS

## 2018-03-08 MED ORDER — HEPARIN SOD (PORK) LOCK FLUSH 100 UNIT/ML IV SOLN
500.0000 [IU] | Freq: Once | INTRAVENOUS | Status: AC | PRN
  Administered 2018-03-08: 500 [IU]
  Filled 2018-03-08: qty 5

## 2018-03-08 MED ORDER — PALONOSETRON HCL INJECTION 0.25 MG/5ML
INTRAVENOUS | Status: AC
  Filled 2018-03-08: qty 5

## 2018-03-08 MED ORDER — PALONOSETRON HCL INJECTION 0.25 MG/5ML
0.2500 mg | Freq: Once | INTRAVENOUS | Status: AC
  Administered 2018-03-08: 0.25 mg via INTRAVENOUS

## 2018-03-08 MED ORDER — SODIUM CHLORIDE 0.9% FLUSH
10.0000 mL | INTRAVENOUS | Status: DC | PRN
  Administered 2018-03-08: 10 mL
  Filled 2018-03-08: qty 10

## 2018-03-08 NOTE — Telephone Encounter (Signed)
Informed patient's daughter of CA 125 results.  She had called the office requesting the results.  All questions answered.  Advised to call for any needs.  Update: Patient's daughter called back and stated she is very concerned because the CA 125 has increased.  Discussed that the patient needs to proceed with her second cycle tomorrow and that the CA 125 will continue to be monitored with hopes that it will start to decrease.  Situation discussed with Dr. Skeet Latch with [facility] who agrees with the plan.

## 2018-03-08 NOTE — Patient Instructions (Signed)
[facility] Discharge Instructions for Patients Receiving Chemotherapy  Today you received the following chemotherapy agents Taxol and Carboplatin.   To help prevent nausea and vomiting after your treatment, we encourage you to take your nausea medication as directed.   If you develop nausea and vomiting that is not controlled by your nausea medication, call the clinic.   BELOW ARE SYMPTOMS THAT SHOULD BE REPORTED IMMEDIATELY:  *FEVER GREATER THAN 100.5 F  *CHILLS WITH OR WITHOUT FEVER  NAUSEA AND VOMITING THAT IS NOT CONTROLLED WITH YOUR NAUSEA MEDICATION  *UNUSUAL SHORTNESS OF BREATH  *UNUSUAL BRUISING OR BLEEDING  TENDERNESS IN MOUTH AND THROAT WITH OR WITHOUT PRESENCE OF ULCERS  *URINARY PROBLEMS  *BOWEL PROBLEMS  UNUSUAL RASH Items with * indicate a potential emergency and should be followed up as soon as possible.  Feel free to call the clinic should you have any questions or concerns. The clinic phone number is [phone].  Please show the CHEMO ALERT CARD at check-in to the Emergency Department and triage nurse.   Paclitaxel injection What is this medicine? PACLITAXEL (PAK li TAX el) is a chemotherapy drug. It targets fast dividing cells, like cancer cells, and causes these cells to die. This medicine is used to treat ovarian cancer, breast cancer, and other cancers. This medicine may be used for other purposes; ask your health care provider or pharmacist if you have questions. COMMON BRAND NAME(S): Onxol, Taxol What should I tell my health care provider before I take this medicine? They need to know if you have any of these conditions: -blood disorders -irregular heartbeat -infection (especially a virus infection such as chickenpox, cold sores, or herpes) -liver disease -previous or ongoing radiation therapy -an unusual or allergic reaction to paclitaxel, alcohol, polyoxyethylated castor oil, other chemotherapy agents, other medicines, foods,  dyes, or preservatives -pregnant or trying to get pregnant -breast-feeding How should I use this medicine? This drug is given as an infusion into a vein. It is administered in a hospital or clinic by a specially trained health care professional. Talk to your pediatrician regarding the use of this medicine in children. Special care may be needed. Overdosage: If you think you have taken too much of this medicine contact a poison control center or emergency room at once. NOTE: This medicine is only for you. Do not share this medicine with others. What if I miss a dose? It is important not to miss your dose. Call your doctor or health care professional if you are unable to keep an appointment. What may interact with this medicine? Do not take this medicine with any of the following medications: -disulfiram -metronidazole This medicine may also interact with the following medications: -cyclosporine -diazepam -ketoconazole -medicines to increase blood counts like filgrastim, pegfilgrastim, sargramostim -other chemotherapy drugs like cisplatin, doxorubicin, epirubicin, etoposide, teniposide, vincristine -quinidine -testosterone -vaccines -verapamil Talk to your doctor or health care professional before taking any of these medicines: -acetaminophen -aspirin -ibuprofen -ketoprofen -naproxen This list may not describe all possible interactions. Give your health care provider a list of all the medicines, herbs, non-prescription drugs, or dietary supplements you use. Also tell them if you smoke, drink alcohol, or use illegal drugs. Some items may interact with your medicine. What should I watch for while using this medicine? Your condition will be monitored carefully while you are receiving this medicine. You will need important blood work done while you are taking this medicine. This medicine can cause serious allergic reactions. To reduce your risk you   will need to take other medicine(s)  before treatment with this medicine. If you experience allergic reactions like skin rash, itching or hives, swelling of the face, lips, or tongue, tell your doctor or health care professional right away. In some cases, you may be given additional medicines to help with side effects. Follow all directions for their use. This drug may make you feel generally unwell. This is not uncommon, as chemotherapy can affect healthy cells as well as cancer cells. Report any side effects. Continue your course of treatment even though you feel ill unless your doctor tells you to stop. Call your doctor or health care professional for advice if you get a fever, chills or sore throat, or other symptoms of a cold or flu. Do not treat yourself. This drug decreases your body's ability to fight infections. Try to avoid being around people who are sick. This medicine may increase your risk to bruise or bleed. Call your doctor or health care professional if you notice any unusual bleeding. Be careful brushing and flossing your teeth or using a toothpick because you may get an infection or bleed more easily. If you have any dental work done, tell your dentist you are receiving this medicine. Avoid taking products that contain aspirin, acetaminophen, ibuprofen, naproxen, or ketoprofen unless instructed by your doctor. These medicines may hide a fever. Do not become pregnant while taking this medicine. Women should inform their doctor if they wish to become pregnant or think they might be pregnant. There is a potential for serious side effects to an unborn child. Talk to your health care professional or pharmacist for more information. Do not breast-feed an infant while taking this medicine. Men are advised not to father a child while receiving this medicine. This product may contain alcohol. Ask your pharmacist or healthcare provider if this medicine contains alcohol. Be sure to tell all healthcare providers you are taking this  medicine. Certain medicines, like metronidazole and disulfiram, can cause an unpleasant reaction when taken with alcohol. The reaction includes flushing, headache, nausea, vomiting, sweating, and increased thirst. The reaction can last from 30 minutes to several hours. What side effects may I notice from receiving this medicine? Side effects that you should report to your doctor or health care professional as soon as possible: -allergic reactions like skin rash, itching or hives, swelling of the face, lips, or tongue -low blood counts - This drug may decrease the number of white blood cells, red blood cells and platelets. You may be at increased risk for infections and bleeding. -signs of infection - fever or chills, cough, sore throat, pain or difficulty passing urine -signs of decreased platelets or bleeding - bruising, pinpoint red spots on the skin, black, tarry stools, nosebleeds -signs of decreased red blood cells - unusually weak or tired, fainting spells, lightheadedness -breathing problems -chest pain -high or low blood pressure -mouth sores -nausea and vomiting -pain, swelling, redness or irritation at the injection site -pain, tingling, numbness in the hands or feet -slow or irregular heartbeat -swelling of the ankle, feet, hands Side effects that usually do not require medical attention (report to your doctor or health care professional if they continue or are bothersome): -bone pain -complete hair loss including hair on your head, underarms, pubic hair, eyebrows, and eyelashes -changes in the color of fingernails -diarrhea -loosening of the fingernails -loss of appetite -muscle or joint pain -red flush to skin -sweating This list may not describe all possible side effects. Call your doctor for   medical advice about side effects. You may report side effects to FDA at 1-800-FDA-1088. Where should I keep my medicine? This drug is given in a hospital or clinic and will not be  stored at home. NOTE: This sheet is a summary. It may not cover all possible information. If you have questions about this medicine, talk to your doctor, pharmacist, or health care provider.  2018 Elsevier/Gold Standard (2015-04-06 19:58:00)   Carboplatin injection What is this medicine? CARBOPLATIN (KAR boe pla tin) is a chemotherapy drug. It targets fast dividing cells, like cancer cells, and causes these cells to die. This medicine is used to treat ovarian cancer and many other cancers. This medicine may be used for other purposes; ask your health care provider or pharmacist if you have questions. COMMON BRAND NAME(S): Paraplatin What should I tell my health care provider before I take this medicine? They need to know if you have any of these conditions: -blood disorders -hearing problems -kidney disease -recent or ongoing radiation therapy -an unusual or allergic reaction to carboplatin, cisplatin, other chemotherapy, other medicines, foods, dyes, or preservatives -pregnant or trying to get pregnant -breast-feeding How should I use this medicine? This drug is usually given as an infusion into a vein. It is administered in a hospital or clinic by a specially trained health care professional. Talk to your pediatrician regarding the use of this medicine in children. Special care may be needed. Overdosage: If you think you have taken too much of this medicine contact a poison control center or emergency room at once. NOTE: This medicine is only for you. Do not share this medicine with others. What if I miss a dose? It is important not to miss a dose. Call your doctor or health care professional if you are unable to keep an appointment. What may interact with this medicine? -medicines for seizures -medicines to increase blood counts like filgrastim, pegfilgrastim, sargramostim -some antibiotics like amikacin, gentamicin, neomycin, streptomycin, tobramycin -vaccines Talk to your doctor  or health care professional before taking any of these medicines: -acetaminophen -aspirin -ibuprofen -ketoprofen -naproxen This list may not describe all possible interactions. Give your health care provider a list of all the medicines, herbs, non-prescription drugs, or dietary supplements you use. Also tell them if you smoke, drink alcohol, or use illegal drugs. Some items may interact with your medicine. What should I watch for while using this medicine? Your condition will be monitored carefully while you are receiving this medicine. You will need important blood work done while you are taking this medicine. This drug may make you feel generally unwell. This is not uncommon, as chemotherapy can affect healthy cells as well as cancer cells. Report any side effects. Continue your course of treatment even though you feel ill unless your doctor tells you to stop. In some cases, you may be given additional medicines to help with side effects. Follow all directions for their use. Call your doctor or health care professional for advice if you get a fever, chills or sore throat, or other symptoms of a cold or flu. Do not treat yourself. This drug decreases your body's ability to fight infections. Try to avoid being around people who are sick. This medicine may increase your risk to bruise or bleed. Call your doctor or health care professional if you notice any unusual bleeding. Be careful brushing and flossing your teeth or using a toothpick because you may get an infection or bleed more easily. If you have any dental work done,   tell your dentist you are receiving this medicine. Avoid taking products that contain aspirin, acetaminophen, ibuprofen, naproxen, or ketoprofen unless instructed by your doctor. These medicines may hide a fever. Do not become pregnant while taking this medicine. Women should inform their doctor if they wish to become pregnant or think they might be pregnant. There is a potential  for serious side effects to an unborn child. Talk to your health care professional or pharmacist for more information. Do not breast-feed an infant while taking this medicine. What side effects may I notice from receiving this medicine? Side effects that you should report to your doctor or health care professional as soon as possible: -allergic reactions like skin rash, itching or hives, swelling of the face, lips, or tongue -signs of infection - fever or chills, cough, sore throat, pain or difficulty passing urine -signs of decreased platelets or bleeding - bruising, pinpoint red spots on the skin, black, tarry stools, nosebleeds -signs of decreased red blood cells - unusually weak or tired, fainting spells, lightheadedness -breathing problems -changes in hearing -changes in vision -chest pain -high blood pressure -low blood counts - This drug may decrease the number of white blood cells, red blood cells and platelets. You may be at increased risk for infections and bleeding. -nausea and vomiting -pain, swelling, redness or irritation at the injection site -pain, tingling, numbness in the hands or feet -problems with balance, talking, walking -trouble passing urine or change in the amount of urine Side effects that usually do not require medical attention (report to your doctor or health care professional if they continue or are bothersome): -hair loss -loss of appetite -metallic taste in the mouth or changes in taste This list may not describe all possible side effects. Call your doctor for medical advice about side effects. You may report side effects to FDA at 1-800-FDA-1088. Where should I keep my medicine? This drug is given in a hospital or clinic and will not be stored at home. NOTE: This sheet is a summary. It may not cover all possible information. If you have questions about this medicine, talk to your doctor, pharmacist, or health care provider.  2018 [employer]vier/Gold Standard  (2007-09-10 14:38:05)  

## 2018-03-12 ENCOUNTER — Encounter: Payer: Self-pay | Admitting: Hematology and Oncology

## 2018-03-29 ENCOUNTER — Inpatient Hospital Stay (HOSPITAL_BASED_OUTPATIENT_CLINIC_OR_DEPARTMENT_OTHER): Payer: Medicare Other | Admitting: Hematology and Oncology

## 2018-03-29 ENCOUNTER — Inpatient Hospital Stay: Payer: Medicare Other

## 2018-03-29 ENCOUNTER — Encounter: Payer: Self-pay | Admitting: Hematology and Oncology

## 2018-03-29 ENCOUNTER — Other Ambulatory Visit: Payer: Medicare Other

## 2018-03-29 ENCOUNTER — Inpatient Hospital Stay: Payer: Medicare Other | Attending: Obstetrics

## 2018-03-29 ENCOUNTER — Other Ambulatory Visit: Payer: Self-pay | Admitting: Hematology and Oncology

## 2018-03-29 VITALS — BP 172/98 | HR 88 | Temp 97.6°F | Resp 18 | Ht 66.0 in | Wt 164.2 lb

## 2018-03-29 DIAGNOSIS — C786 Secondary malignant neoplasm of retroperitoneum and peritoneum: Secondary | ICD-10-CM | POA: Insufficient documentation

## 2018-03-29 DIAGNOSIS — C569 Malignant neoplasm of unspecified ovary: Secondary | ICD-10-CM | POA: Diagnosis not present

## 2018-03-29 DIAGNOSIS — C562 Malignant neoplasm of left ovary: Secondary | ICD-10-CM

## 2018-03-29 DIAGNOSIS — R109 Unspecified abdominal pain: Secondary | ICD-10-CM | POA: Insufficient documentation

## 2018-03-29 DIAGNOSIS — G620 Drug-induced polyneuropathy: Secondary | ICD-10-CM | POA: Diagnosis not present

## 2018-03-29 DIAGNOSIS — K5909 Other constipation: Secondary | ICD-10-CM

## 2018-03-29 DIAGNOSIS — R1011 Right upper quadrant pain: Secondary | ICD-10-CM

## 2018-03-29 DIAGNOSIS — Z5111 Encounter for antineoplastic chemotherapy: Secondary | ICD-10-CM | POA: Insufficient documentation

## 2018-03-29 DIAGNOSIS — I10 Essential (primary) hypertension: Secondary | ICD-10-CM | POA: Insufficient documentation

## 2018-03-29 DIAGNOSIS — T451X5A Adverse effect of antineoplastic and immunosuppressive drugs, initial encounter: Secondary | ICD-10-CM | POA: Diagnosis not present

## 2018-03-29 LAB — COMPREHENSIVE METABOLIC PANEL
ALBUMIN: 3.9 g/dL (ref 3.5–5.0)
ALT: 32 U/L (ref 0–44)
ANION GAP: 11 (ref 5–15)
AST: 32 U/L (ref 15–41)
Alkaline Phosphatase: 214 U/L — ABNORMAL HIGH (ref 38–126)
BUN: 14 mg/dL (ref 8–23)
CHLORIDE: 104 mmol/L (ref 98–111)
CO2: 22 mmol/L (ref 22–32)
Calcium: 10 mg/dL (ref 8.9–10.3)
Creatinine, Ser: 0.83 mg/dL (ref 0.44–1.00)
GFR calc Af Amer: >60 mL/min (ref >60)
GFR calc non Af Amer: >60 mL/min (ref >60)
GLUCOSE: 179 mg/dL — AB (ref 70–99)
Potassium: 4 mmol/L (ref 3.5–5.1)
SODIUM: 137 mmol/L (ref 135–145)
Total Bilirubin: 0.4 mg/dL (ref 0.3–1.2)
Total Protein: 8 g/dL (ref 6.5–8.1)

## 2018-03-29 LAB — CBC WITH DIFFERENTIAL/PLATELET
ABS IMMATURE GRANULOCYTES: 0.15 10*3/uL — AB (ref 0.00–0.07)
Basophils Absolute: 0 10*3/uL (ref 0.0–0.1)
Basophils Relative: 0 %
Eosinophils Absolute: 0 10*3/uL (ref 0.0–0.5)
Eosinophils Relative: 0 %
HEMATOCRIT: 37.4 % (ref 36.0–46.0)
HEMOGLOBIN: 12.1 g/dL (ref 12.0–15.0)
IMMATURE GRANULOCYTES: 2 %
LYMPHS ABS: 1 10*3/uL (ref 0.7–4.0)
LYMPHS PCT: 12 %
MCH: 27.9 pg (ref 26.0–34.0)
MCHC: 32.4 g/dL (ref 30.0–36.0)
MCV: 86.2 fL (ref 80.0–100.0)
MONOS PCT: 4 %
Monocytes Absolute: 0.3 10*3/uL (ref 0.1–1.0)
NEUTROS PCT: 82 %
Neutro Abs: 7.2 10*3/uL (ref 1.7–7.7)
Platelets: 270 10*3/uL (ref 150–400)
RBC: 4.34 MIL/uL (ref 3.87–5.11)
RDW: 14.6 % (ref 11.5–15.5)
WBC: 8.7 10*3/uL (ref 4.0–10.5)
nRBC: 0 % (ref 0.0–0.2)

## 2018-03-29 MED ORDER — SODIUM CHLORIDE 0.9% FLUSH
10.0000 mL | INTRAVENOUS | Status: DC | PRN
  Administered 2018-03-29: 10 mL
  Filled 2018-03-29: qty 10

## 2018-03-29 MED ORDER — PALONOSETRON HCL INJECTION 0.25 MG/5ML
0.2500 mg | Freq: Once | INTRAVENOUS | Status: AC
  Administered 2018-03-29: 0.25 mg via INTRAVENOUS

## 2018-03-29 MED ORDER — FAMOTIDINE IN NACL 20-0.9 MG/50ML-% IV SOLN
INTRAVENOUS | Status: AC
  Filled 2018-03-29: qty 50

## 2018-03-29 MED ORDER — SODIUM CHLORIDE 0.9 % IV SOLN
131.2500 mg/m2 | Freq: Once | INTRAVENOUS | Status: AC
  Administered 2018-03-29: 246 mg via INTRAVENOUS
  Filled 2018-03-29: qty 41

## 2018-03-29 MED ORDER — SODIUM CHLORIDE 0.9 % IV SOLN
448.8000 mg | Freq: Once | INTRAVENOUS | Status: AC
  Administered 2018-03-29: 450 mg via INTRAVENOUS
  Filled 2018-03-29: qty 45

## 2018-03-29 MED ORDER — DIPHENHYDRAMINE HCL 50 MG/ML IJ SOLN
25.0000 mg | Freq: Once | INTRAMUSCULAR | Status: AC
  Administered 2018-03-29: 25 mg via INTRAVENOUS

## 2018-03-29 MED ORDER — PALONOSETRON HCL INJECTION 0.25 MG/5ML
INTRAVENOUS | Status: AC
  Filled 2018-03-29: qty 5

## 2018-03-29 MED ORDER — SODIUM CHLORIDE 0.9 % IV SOLN
Freq: Once | INTRAVENOUS | Status: AC
  Administered 2018-03-29: 12:00:00 via INTRAVENOUS
  Filled 2018-03-29: qty 5

## 2018-03-29 MED ORDER — HEPARIN SOD (PORK) LOCK FLUSH 100 UNIT/ML IV SOLN
500.0000 [IU] | Freq: Once | INTRAVENOUS | Status: AC | PRN
  Administered 2018-03-29: 500 [IU]
  Filled 2018-03-29: qty 5

## 2018-03-29 MED ORDER — FAMOTIDINE IN NACL 20-0.9 MG/50ML-% IV SOLN
20.0000 mg | Freq: Once | INTRAVENOUS | Status: AC
  Administered 2018-03-29: 20 mg via INTRAVENOUS

## 2018-03-29 MED ORDER — DIPHENHYDRAMINE HCL 50 MG/ML IJ SOLN
INTRAMUSCULAR | Status: AC
  Filled 2018-03-29: qty 1

## 2018-03-29 MED ORDER — SODIUM CHLORIDE 0.9 % IV SOLN
Freq: Once | INTRAVENOUS | Status: AC
  Administered 2018-03-29: 11:00:00 via INTRAVENOUS
  Filled 2018-03-29: qty 250

## 2018-03-29 MED ORDER — SODIUM CHLORIDE 0.9% FLUSH
10.0000 mL | Freq: Once | INTRAVENOUS | Status: AC
  Administered 2018-03-29: 10 mL
  Filled 2018-03-29: qty 10

## 2018-03-29 NOTE — Progress Notes (Signed)
Patient's daughter came in to inquire about financial assistance.  Introduced myself as Arboriculturist and listened to her concerns. She states they are starting to receive bills and patient has two insurances. Advised her to contact the number on the bills and confirm both insurances are being filed and also to check with the secondary insurance to be sure her plan will cover the 20% insurance remaining from the primary insurance is covered. She verbalized understanding.  Discussed the one-time $1000 [employer] grant to assist with personal expenses but not limited to gas cards, medication copays and etc. Gave income guidelines and she states her mother does fall within the guidelines. Asked if she could bring proof of income on 04/12/18 to assist her mom in applying. She states she could. Gave my card for any additional financial questions or concerns.

## 2018-03-29 NOTE — Assessment & Plan Note (Signed)
She is noted to have significant hypertension I recommend blood pressure monitoring at home and for her to call me if her blood pressures consistently elevated with systolic blood pressure greater than 848 or diastolic blood pressure greater than 90

## 2018-03-29 NOTE — Assessment & Plan Note (Signed)
she has mild peripheral neuropathy, likely related to side effects of treatment. I plan to reduce the dose of treatment as outlined above.   

## 2018-03-29 NOTE — Assessment & Plan Note (Signed)
She has intermittent right upper quadrant pain Examination did not reveal palpable masses Serum alkaline phosphatase is improving I recommend the patient continue over-the-counter oral analgesics

## 2018-03-29 NOTE — Patient Instructions (Signed)
   [facility] Discharge Instructions for Patients Receiving Chemotherapy  Today you received the following chemotherapy agents Taxol and Carboplatin   To help prevent nausea and vomiting after your treatment, we encourage you to take your nausea medication as directed.    If you develop nausea and vomiting that is not controlled by your nausea medication, call the clinic.   BELOW ARE SYMPTOMS THAT SHOULD BE REPORTED IMMEDIATELY:  *FEVER GREATER THAN 100.5 F  *CHILLS WITH OR WITHOUT FEVER  NAUSEA AND VOMITING THAT IS NOT CONTROLLED WITH YOUR NAUSEA MEDICATION  *UNUSUAL SHORTNESS OF BREATH  *UNUSUAL BRUISING OR BLEEDING  TENDERNESS IN MOUTH AND THROAT WITH OR WITHOUT PRESENCE OF ULCERS  *URINARY PROBLEMS  *BOWEL PROBLEMS  UNUSUAL RASH Items with * indicate a potential emergency and should be followed up as soon as possible.  Feel free to call the clinic should you have any questions or concerns. The clinic phone number is [phone].  Please show the CHEMO ALERT CARD at check-in to the Emergency Department and triage nurse.   

## 2018-03-29 NOTE — Progress Notes (Signed)
[place] OFFICE PROGRESS NOTE  Patient Care Team: Marton Redwood, [name]  ASSESSMENT & PLAN:  Ovarian cancer [facility]) She tolerated reduced dose therapy better We will proceed with cycle 3 with similar dose adjustment Plan to order CT imaging in 2 weeks for objective response to therapy She has appointment to see GYN oncologist at the time to determine if interval debulking surgery is indicated  Peripheral neuropathy due to chemotherapy [facility]) she has mild peripheral neuropathy, likely related to side effects of treatment. I plan to reduce the dose of treatment as outlined above.   HTN (hypertension) She is noted to have significant hypertension I recommend blood pressure monitoring at home and for her to call me if her blood pressures consistently elevated with systolic blood pressure greater than 017 or diastolic blood pressure greater than 90  Other constipation She has mild chronic constipation We discussed the importance of chronic laxative therapy.  Abdominal pain She has intermittent right upper quadrant pain Examination did not reveal palpable masses Serum alkaline phosphatase is improving I recommend the patient continue over-the-counter oral analgesics   Orders Placed This Encounter  Procedures  . CT CHEST W CONTRAST    Standing Status:   Future    Standing Expiration Date:   03/30/2019    Order Specific Question:   If indicated for the ordered procedure, I authorize the administration of contrast media per Radiology protocol    Answer:   Yes    Order Specific Question:   Preferred imaging location?    Answer:   [facility]    Order Specific Question:   Radiology Contrast Protocol - do NOT remove file path    Answer:   \\[email or web address]  . CT ABDOMEN PELVIS W CONTRAST    Standing Status:   Future    Standing Expiration Date:   03/30/2019    Order Specific Question:   If indicated  for the ordered procedure, I authorize the administration of contrast media per Radiology protocol    Answer:   Yes    Order Specific Question:   Preferred imaging location?    Answer:   [facility]    Order Specific Question:   Radiology Contrast Protocol - do NOT remove file path    Answer:   \\[email or web address]    INTERVAL HISTORY: Please see below for problem oriented charting. She returns with daughter for further follow-up She tolerated reduced dose cycle 2 of treatment better She continues to have peripheral neuropathy with numbness affecting the tips of fingers and toes but not worse She denies nausea She has intermittent constipation, resolved with aggressive laxatives She complained of right upper quadrant fullness No recent infection, fever or chills She has not been checking her blood pressure lately.  Denies blurriness of vision or headaches  SUMMARY OF ONCOLOGIC HISTORY:   Ovarian cancer ([place])   01/02/2018 Imaging    1. Large complex left paramidline pelvic mass likely rises from the left ovary. Together with left periaortic adenopathy and peritoneal carcinomatosis, findings are worrisome for metastatic ovarian cancer. 2. Small pelvic free fluid. 3.  Aortic atherosclerosis (ICD10-170.0).    01/21/2018 Pathology Results    Omentum, biopsy, dominant omental nodule within the right lower abdominal quadrant - POORLY DIFFERENTIATED CARCINOMA CONSISTENT WITH GYNECOLOGIC PRIMARY. - SEE MICROSCOPIC DESCRIPTION. Microscopic Comment The tumor is characterized by sheets of tumor cells with enlarged nuclei, many containing prominent nucleoli. There are patchy areas with eosinophilic cytoplasmic inclusions. Immunohistochemistry  shows positivity with cytokeratin AE1/AE3, cytokeratin 7, estrogen receptor, progesterone receptor, PAX-8, WT-1, and placental alkaline phosphatase (PLAP). The tumor is negative with alpha fetoprotein, human chorionic  gonadotropin, CD117, CD10, CD30, CD56, CDX-2, CEA, cytokeratin 20, S100, SOX-10, synaptophysin, chromogranin, D2-40, GATA-3, GCDFP, Glypican 3 and Inhibin. 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 The estrogen receptor is 100% with strong staining and the progesterone receptor is 90% with strong staining.    01/21/2018 Procedure    Technically successful CT guided core needle biopsy of dominant omental nodule within the right lower abdominal quadrant, adjacent to the cecum.    01/24/2018 Cancer Staging    Staging form: Ovary, Fallopian Tube, and Primary Peritoneal Carcinoma, AJCC 8th Edition - Clinical: Stage IIIC (cT3c, cN1b, cM0) - Signed by [name], MD on 01/24/2018    01/25/2018 Tumor Marker    Patient's tumor was tested for the following markers: AFP Results of the tumor marker test revealed 4.7    01/31/2018 Surgery    Pre-operative Diagnosis: Ovarian cancer NOS  Post-operative Diagnosis: At least Stage 3 ovarian cancer   Operation: Laparoscopic omental biopsy  Surgeon: Mart Piggs, MD  Specimens: Omentum  Operative Findings: Omental nodule >2cm. Large left pelvic mass 12-15cm with sigmoid draped medially and potentially adherent to colon. The mass appeared to have a 3cm cystic area that was ruptured preoperatively. Uterus appears normal with small anterior fibroid ~1cm. Right adnexa nonenlarged. Small bowel without evidence of disease. No diaphragmatic disease.     01/31/2018 Pathology Results    Omentum, resection for tumor - HIGH GRADE CARCINOMA. - SEE MICROSCOPIC DESCRIPTION. Microscopic Comment This case is sent to Dr. Annitta Jersey for consultation. The case was discussed with Dr. Gerarda Fraction on 02/01/18. (JDP:kh 02/01/18) The omentum shows nodules of high grade carcinoma characterized by diffuse sheets of malignant cells with enlarged nuclei with prominent nucleoli. There is moderate to abundant eosinophilic  cytoplasm and frequent mitotic figures. 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 The entire specimen is examined histologically and there are nodules of tumor throughout the specimen with identical morphology. Additional immunohistochemistry for hepatoid markers will be performed and reported as an addendum    02/11/2018 Procedure    Status post right IJ port catheter placement. Catheter ready for use.    02/14/2018 Tumor Marker    Patient's tumor was tested for the following markers: CA-125 Results of the tumor marker test revealed 1523    02/15/2018 -  Chemotherapy    The patient had carboplatin and taxol. Taxol dose reduced from cycle 2 onwards    02/23/2018 Imaging    Interval development of abdominal ascites.  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  No significant change in multilobulated complex left pelvic mass, which compresses the pelvic structures and displaces them to the right.  Interval development of mild right hydronephrosis and hydroureter, possibly postobstructive.    03/08/2018 Tumor Marker    Patient's tumor was tested for the following markers: CA-125 Results of the tumor marker test revealed 1844     REVIEW OF SYSTEMS:   Constitutional: Denies fevers, chills or abnormal weight loss Eyes: Denies blurriness of vision Ears, nose, mouth, throat, and face: Denies mucositis or sore throat  Respiratory: Denies cough, dyspnea or wheezes Cardiovascular: Denies palpitation, chest discomfort or lower extremity swelling Skin: Denies abnormal skin rashes Lymphatics: Denies new lymphadenopathy or easy bruising Behavioral/Psych: Mood is stable, no new changes  All other systems were  reviewed with the patient and are negative.  I have reviewed the past medical history, past surgical history, social history and family history with the patient and they are unchanged from previous note.  ALLERGIES:  has No Known Allergies.  MEDICATIONS:  Current Outpatient Medications  Medication Sig Dispense Refill  . acetaminophen (TYLENOL) 500 MG tablet Take 500 mg by mouth every 6 (six) hours as needed for mild pain.    Marland Kitchen amLODipine (NORVASC) 5 MG tablet Take 5 mg by mouth daily at 6 PM.    . dexamethasone (DECADRON) 4 MG tablet Take 3 tabs at the night before and 3 tab the morning of chemotherapy, every 3 weeks, by mouth 36 tablet 0  . ibuprofen (ADVIL,MOTRIN) 200 MG tablet Take 200 mg by mouth daily as needed. For pain     . levothyroxine (SYNTHROID, LEVOTHROID) 88 MCG tablet Take 88 mcg by mouth daily before breakfast.    . lidocaine-prilocaine (EMLA) cream Apply to affected area once 30 g 3  . loratadine (CLARITIN) 10 MG tablet Take 10 mg by mouth daily as needed for allergies (for congestion).     . LORazepam (ATIVAN) 0.5 MG tablet Take 1 tablet (0.5 mg total) by mouth 2 (two) times daily as needed for anxiety or sedation. 30 tablet 0  . metoprolol succinate (TOPROL-XL) 50 MG 24 hr tablet Take 50 mg by mouth daily. In the morning    . Multiple Vitamins-Minerals (MULTIVITAMINS THER. W/MINERALS) TABS Take 1 tablet by mouth daily.      Marland Kitchen olmesartan (BENICAR) 40 MG tablet Take 40 mg by mouth daily at 6 PM.     . ondansetron (ZOFRAN ODT) 4 MG disintegrating tablet Take 1 tablet (4 mg total) by mouth every 8 (eight) hours as needed for nausea or vomiting. 60 tablet 1  . ondansetron (ZOFRAN) 8 MG tablet Take 1 tablet (8 mg total) by mouth every 8 (eight) hours as needed for refractory nausea / vomiting. Start on day 3 after chemo. 30 tablet 1  . prochlorperazine (COMPAZINE) 10 MG tablet Take 1 tablet (10 mg total) by mouth every 6 (six) hours as needed (Nausea or vomiting). 30 tablet 1    No current facility-administered medications for this visit.    Facility-Administered Medications Ordered in Other Visits  Medication Dose Route Frequency Provider Last Rate Last Dose  . CARBOplatin (PARAPLATIN) 450 mg in sodium chloride 0.9 % 250 mL chemo infusion  450 mg Intravenous Once Alvy Bimler, Mirabelle Cyphers, MD      . heparin lock flush 100 unit/mL  500 Units Intracatheter Once PRN Alvy Bimler, Tito Ausmus, MD      . PACLitaxel (TAXOL) 246 mg in sodium chloride 0.9 % 250 mL chemo infusion (> 21m/m2)  131.25 mg/m2 (Treatment Plan Recorded) Intravenous Once Demita Tobia, MD      . sodium chloride flush (NS) 0.9 % injection 10 mL  10 mL Intracatheter PRN GAlvy Bimler Armando Bukhari, MD        PHYSICAL EXAMINATION: ECOG PERFORMANCE STATUS: 1 - Symptomatic but completely ambulatory  Vitals:   03/29/18 1029  BP: (!) 172/98  Pulse: 88  Resp: 18  Temp: 97.6 F (36.4 C)  SpO2: 99%   Filed Weights   03/29/18 1029  Weight: 164 lb 3.2 oz (74.5 kg)  GENERAL:alert, no distress and comfortable SKIN: skin color, texture, turgor are normal, no rashes or significant lesions EYES: normal, Conjunctiva are pink and non-injected, sclera clear OROPHARYNX:no exudate, no erythema and lips, buccal mucosa, and tongue normal  NECK: supple, thyroid normal size, non-tender, without nodularity LYMPH:  no palpable lymphadenopathy in the cervical, axillary or inguinal LUNGS: clear to auscultation and percussion with normal breathing effort HEART: regular rate & rhythm and no murmurs and no lower extremity edema ABDOMEN:abdomen soft, palpable pelvic mass arising near the left lower quadrant.  No abdominal ascites.  No tenderness on palpation Musculoskeletal:no cyanosis of digits and no clubbing  NEURO: alert & oriented x 3 with fluent speech, no focal motor/sensory deficits  LABORATORY DATA:  I have reviewed the data as listed    Component Value Date/Time   NA 137 03/29/2018 0944   K 4.0 03/29/2018 0944   CL 104 03/29/2018 0944    CO2 22 03/29/2018 0944   GLUCOSE 179 (H) 03/29/2018 0944   BUN 14 03/29/2018 0944   CREATININE 0.83 03/29/2018 0944   CALCIUM 10.0 03/29/2018 0944   PROT 8.0 03/29/2018 0944   ALBUMIN 3.9 03/29/2018 0944   AST 32 03/29/2018 0944   ALT 32 03/29/2018 0944   ALKPHOS 214 (H) 03/29/2018 0944   BILITOT 0.4 03/29/2018 0944   GFRNONAA >60 03/29/2018 0944   GFRAA >60 03/29/2018 0944    No results found for: SPEP, UPEP  Lab Results  Component Value Date   WBC 8.7 03/29/2018   NEUTROABS 7.2 03/29/2018   HGB 12.1 03/29/2018   HCT 37.4 03/29/2018   MCV 86.2 03/29/2018   PLT 270 03/29/2018      Chemistry      Component Value Date/Time   NA 137 03/29/2018 0944   K 4.0 03/29/2018 0944   CL 104 03/29/2018 0944   CO2 22 03/29/2018 0944   BUN 14 03/29/2018 0944   CREATININE 0.83 03/29/2018 0944      Component Value Date/Time   CALCIUM 10.0 03/29/2018 0944   ALKPHOS 214 (H) 03/29/2018 0944   AST 32 03/29/2018 0944   ALT 32 03/29/2018 0944   BILITOT 0.4 03/29/2018 0944      All questions were answered. The patient knows to call the clinic with any problems, questions or concerns. No barriers to learning was detected.  I spent 20 minutes counseling the patient face to face. The total time spent in the appointment was 30 minutes and more than 50% was on counseling and review of test results  [name], MD 03/29/2018 12:26 PM

## 2018-03-29 NOTE — Assessment & Plan Note (Signed)
She has mild chronic constipation We discussed the importance of chronic laxative therapy.

## 2018-03-29 NOTE — Assessment & Plan Note (Signed)
She tolerated reduced dose therapy better We will proceed with cycle 3 with similar dose adjustment Plan to order CT imaging in 2 weeks for objective response to therapy She has appointment to see GYN oncologist at the time to determine if interval debulking surgery is indicated

## 2018-03-30 LAB — CA 125: CANCER ANTIGEN (CA) 125: 1141 U/mL — AB (ref 0.0–38.1)

## 2018-04-01 ENCOUNTER — Telehealth: Payer: Self-pay

## 2018-04-01 NOTE — Telephone Encounter (Signed)
Called and given below message. She verbalized understanding. 

## 2018-04-01 NOTE — Telephone Encounter (Signed)
-----   Message from [name], MD sent at 04/01/2018  8:14 AM EDT ----- Regarding: CA-125 Pls call daughter/patient CA-125 is better ----- Message ----- From: Interface, Lab In [place] Sent: 03/29/2018  10:31 AM EDT To: [name], MD

## 2018-04-10 ENCOUNTER — Ambulatory Visit (HOSPITAL_COMMUNITY)
Admission: RE | Admit: 2018-04-10 | Discharge: 2018-04-10 | Disposition: A | Discharge status: Home / Self Care | Payer: Medicare Other | Source: Ambulatory Visit | Attending: Hematology and Oncology | Admitting: Hematology and Oncology

## 2018-04-10 ENCOUNTER — Encounter (HOSPITAL_COMMUNITY): Payer: Self-pay

## 2018-04-10 DIAGNOSIS — I700 Atherosclerosis of aorta: Secondary | ICD-10-CM | POA: Insufficient documentation

## 2018-04-10 DIAGNOSIS — I2510 Atherosclerotic heart disease of native coronary artery without angina pectoris: Secondary | ICD-10-CM | POA: Diagnosis not present

## 2018-04-10 DIAGNOSIS — C562 Malignant neoplasm of left ovary: Secondary | ICD-10-CM | POA: Diagnosis not present

## 2018-04-10 DIAGNOSIS — Z5111 Encounter for antineoplastic chemotherapy: Secondary | ICD-10-CM | POA: Diagnosis not present

## 2018-04-10 DIAGNOSIS — C569 Malignant neoplasm of unspecified ovary: Secondary | ICD-10-CM | POA: Diagnosis not present

## 2018-04-10 DIAGNOSIS — R188 Other ascites: Secondary | ICD-10-CM | POA: Diagnosis not present

## 2018-04-10 MED ORDER — IOHEXOL 300 MG/ML  SOLN
100.0000 mL | Freq: Once | INTRAMUSCULAR | Status: AC | PRN
  Administered 2018-04-10: 100 mL via INTRAVENOUS

## 2018-04-10 MED ORDER — SODIUM CHLORIDE 0.9 % IJ SOLN
INTRAMUSCULAR | Status: AC
  Filled 2018-04-10: qty 50

## 2018-04-11 NOTE — Progress Notes (Signed)
[name] at [facility]   Progress Note: Established Patient FIRST VISIT  Consult was originally requested by Dr. Marton Redwood for a palpable mass that on workup was found to be c/w an adnexal mass with associated carcinomatosis and periaortic adenopathy (1.14x2.17cm)  Chief Complaint  Patient presents with  . Malignant neoplasm of ovary, unspecified laterality [facility])    HPI: Shannon Ellis  is an 84 y.o.  P4   Interval History Recall start CA125 was 1492 and she has carcinomatosis/omental cake and a 2.1x1.1 cm paraaortic node.  Since her last visit 02/08/18 she has had 3 cycles of chemotherapy. CA125 increased precycle2, but decreased cycle 3 as noted below. Imaging has been performed post-cycle 3 and she presents for consideration of interval debulking.  Unfortunately there is mention of new disease on the imaging in the omentum; the comparison was to Sept rather than the early treatment scan  She feels "terrible" on chemo. Everything has just "changed her life".   Presenting History She was seen by her PCP, Dr. Brigitte Pulse, for an annual exam and on palpation of the abdomen a mass was noted. Workup included a CTScan AP 01/02/18 revealing a large complex pelvic mass, carcinomatosis with omental nodularity, and associated left periaortic adenopathy (measured by me at 1.14 x 2.17cm). No chest imaging.  01/03/18 CA125 was obtained and equal to 1492. She was therefore referred for recommendations.  She has noted some fatigue and apathy. No pain. She discusses some discomfort that she first noted 3 years ago in her LLQ but the family states it was elsewhere that she was having pain then. Denies bleeding, vaginal dc. She has had about 3-4 months of early satiety but no weight loss, no reflux. Some slight constipation but no big change in bowel movements. Last colonoscopy was 2-3 years ago.   Biopsy of the omentum -  returned 01/21/2018 as poorly  differentiated carcinoma with stains +PAX8, WT1, ER/PR, CK7, and +AE1/AE3, PLAP. Negative for multiple other markers with a read the morphology and immunophenotype are suggestive of a germ cell tumor.  Given the unusual histology additional sampling was preferred.  I took her 01/31/18 and performed laparoscopic biopsies of the omentum. This has been reviewed by [facility] and returned with likely EOC. Hepatoid features may be present and additional stains could be done to add to final diagnosis.  Imported EPIC Oncologic History:    Ovarian cancer ([place])   01/02/2018 Imaging    1. Large complex left paramidline pelvic mass likely rises from the left ovary. Together with left periaortic adenopathy and peritoneal carcinomatosis, findings are worrisome for metastatic ovarian cancer. 2. Small pelvic free fluid. 3.  Aortic atherosclerosis (ICD10-170.0).    01/21/2018 Pathology Results    Omentum, biopsy, dominant omental nodule within the right lower abdominal quadrant - POORLY DIFFERENTIATED CARCINOMA CONSISTENT WITH GYNECOLOGIC PRIMARY. - SEE MICROSCOPIC DESCRIPTION. Microscopic Comment The tumor is characterized by sheets of tumor cells with enlarged nuclei, many containing prominent nucleoli. There are patchy areas with eosinophilic cytoplasmic inclusions. Immunohistochemistry shows positivity with cytokeratin AE1/AE3, cytokeratin 7, estrogen receptor, progesterone receptor, PAX-8, WT-1, and placental alkaline phosphatase (PLAP). The tumor is negative with alpha fetoprotein, human chorionic gonadotropin, CD117, CD10, CD30, CD56, CDX-2, CEA, cytokeratin 20, S100, SOX-10, synaptophysin, chromogranin, D2-40, GATA-3, GCDFP, Glypican 3 and Inhibin. 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 The estrogen receptor is 100% with strong staining and the progesterone receptor is 90% with  strong staining.    01/21/2018 Procedure    Technically  successful CT guided core needle biopsy of dominant omental nodule within the right lower abdominal quadrant, adjacent to the cecum.    01/24/2018 Cancer Staging    Staging form: Ovary, Fallopian Tube, and Primary Peritoneal Carcinoma, AJCC 8th Edition - Clinical: Stage IIIC (cT3c, cN1b, cM0) - Signed by [name], MD on 01/24/2018    01/25/2018 Tumor Marker    Patient's tumor was tested for the following markers: AFP Results of the tumor marker test revealed 4.7    01/31/2018 Surgery    Pre-operative Diagnosis: Ovarian cancer NOS  Post-operative Diagnosis: At least Stage 3 ovarian cancer   Operation: Laparoscopic omental biopsy  Surgeon: Mart Piggs, MD  Specimens: Omentum  Operative Findings: Omental nodule >2cm. Large left pelvic mass 12-15cm with sigmoid draped medially and potentially adherent to colon. The mass appeared to have a 3cm cystic area that was ruptured preoperatively. Uterus appears normal with small anterior fibroid ~1cm. Right adnexa nonenlarged. Small bowel without evidence of disease. No diaphragmatic disease.     01/31/2018 Pathology Results    Omentum, resection for tumor - HIGH GRADE CARCINOMA. - SEE MICROSCOPIC DESCRIPTION. Microscopic Comment This case is sent to Dr. Annitta Jersey for consultation. The case was discussed with Dr. Gerarda Fraction on 02/01/18. (JDP:kh 02/01/18) The omentum shows nodules of high grade carcinoma characterized by diffuse sheets of malignant cells with enlarged nuclei with prominent nucleoli. There is moderate to abundant eosinophilic cytoplasm and frequent mitotic figures. 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 The entire specimen is examined histologically and  there are nodules of tumor throughout the specimen with identical morphology. Additional immunohistochemistry for hepatoid markers will be performed and reported as an addendum    02/11/2018 Procedure    Status post right IJ port catheter placement. Catheter ready for use.    02/14/2018 Tumor Marker    Patient's tumor was tested for the following markers: CA-125 Results of the tumor marker test revealed 1523    02/15/2018 -  Chemotherapy    The patient had carboplatin and taxol. Taxol dose reduced from cycle 2 onwards    02/23/2018 Imaging    Interval development of abdominal ascites.  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  No significant change in multilobulated complex left pelvic mass, which compresses the pelvic structures and displaces them to the right.  Interval development of mild right hydronephrosis and hydroureter, possibly postobstructive.    03/08/2018 Tumor Marker    Patient's tumor was tested for the following markers: CA-125 Results of the tumor marker test revealed 1844    03/29/2018 Tumor Marker    Patient's tumor was tested for the following markers: CA-125 Results of the tumor marker test revealed 1141    04/10/2018 Imaging    1. New omental nodule. Otherwise, left ovarian mass and right paracolic gutter nodule have decreased in size in the interval. 2. Small ascites, increased. 3. Endometrial cavity may be dilated and contains fluid. Please correlate clinically. 4. Mild prominence of the ureters bilaterally, possibly due to large left adnexal mass. 5. Aortic atherosclerosis (ICD10-170.0). Coronary artery calcification.     Measurement of disease:  CA125 . 01/04/18 1492 (preop)  Recent Labs  01/31/18 1415 02/14/18 1505 03/05/18 1016 03/29/18 0944  CAN125  --  1,523.0* 1,844.0* 1,141.0*  INHBB <7.0  --   --   --     .   Radiology:   01/02/18 CTAP - FINDINGS:l ower chest: 6 mm subpleural right lower lobe nodule is  unchanged and considered benign. Mild scarring in both lower lobes. Heart is at the upper limits of normal in size. No pericardial or pleural effusion. Hepatobiliary: Liver and gallbladder are unremarkable. No biliary ductal dilatation. Pancreas: Negative. Spleen: Negative. Adrenals/Urinary Tract: Adrenal glands are unremarkable. Subcentimeter low-attenuation lesions in the kidneys are too small to characterize. Ureters are decompressed. Bladder is low in volume and largely obscured by streak artifact from a right hip arthroplasty.  Stomach/Bowel: Stomach, small bowel and colon are unremarkable. Appendix is not readily visualized.  Vascular/Lymphatic: Atherosclerotic calcification of the arterial vasculature without abdominal aortic aneurysm. Left periaortic lymph node measures 11 mm (image 28).  Reproductive: Low pelvic structures are somewhat obscured by streak artifact from a right hip arthroplasty. There is a large heterogeneous mass in the left paramidline pelvis, measuring 14.2 x 8.1 x 11.4 cm, with associated cystic components and/or fluid. Left ovary is not visualized as a separate structure. Uterus is seen to the right of the chest described mass (series 2, image 65).  Other: Small pelvic free fluid. Omental haziness and nodularity are seen with an index omental nodule anterior to the gallbladder measuring 1.5 cm.  Musculoskeletal: Right hip arthroplasty. Degenerative changes in the spine. No worrisome lytic or sclerotic lesions.  IMPRESSION: 1. Large complex left paramidline pelvic mass likely rises from the left ovary. Together with left periaortic adenopathy and peritoneal carcinomatosis, findings are worrisome for metastatic ovarian cancer. 2. Small pelvic free fluid. 3.  Aortic atherosclerosis   02/23/18 CTCAP - COMPARISON:  08/05/2019FINDINGS:Lower chest: No acute abnormality.Hepatobiliary: No focal liver abnormality is seen. No gallstones,gallbladder wall thickening, or biliary  dilatation.Pancreas: Unremarkable. Nopancreatic ductal dilatation orsurrounding inflammatory changes.Spleen: Normal in size without focal abnormality.Adrenals/Urinary Tract: Mild right hydronephrosis and hydroureter.No evidence of obstructing calculi.Stomach/Bowel:Small hiatal hernia. Normal appearance of thestomach, small and large bowel.Vascular/Lymphatic: Calcific atherosclerotic disease of the aorta.Again seen is left periaortic lymph node. Soft tissue noduleadjacentto the cecum measures 2.3 cm, stable. Again seen is omentalhaziness. The index omental mass anterior to the gallbladder hasgreatly decreased in size.Reproductive: The large multilobular left paramedial pelvic massmeasures 1 5 by 8.4 cm. Again seen is associated cystic componentand adjacent loculated fluid.Other: Interval development of abdominal ascites.Musculoskeletal: No suspicious osseous lesions. Spondylosis of thelumbosacralspine. Post right hip arthroplasty.IMPRESSION:Interval development of abdominal ascites.Decrease in omental carcinomatosis, likely due to chemotherapy.No significant change in multilobulated complex left pelvicmass,which compresses the pelvic structures and displaces them to theright.Interval development of mild right hydronephrosis and hydroureter,possibly postobstructive.  04/10/18 - CTCAP -     Ct Chest W Contrast  Result Date: 04/10/2018 CLINICAL DATA:  Ovarian cancer, chemotherapy in progress. Fatigue and bloating. EXAM: CT CHEST, ABDOMEN, AND PELVIS WITH CONTRAST TECHNIQUE: Multidetector CT imaging of the chest, abdomen and pelvis was performed following the standard protocol during bolus administration of intravenous contrast. CONTRAST:  164m OMNIPAQUE IOHEXOL 300 MG/ML  SOLN COMPARISON:  CT abdomen pelvis 02/23/2018 and 02/19/2013. CT chest 11/19/2010. FINDINGS: CT CHEST FINDINGS Cardiovascular: Right IJ Port-A-Cath terminates in the low SVC. Atherosclerotic calcification of the arterial  vasculature, including coronary arteries and aortic valve. Heart is at the upper limits of normal in size to mildly enlarged. No pericardial effusion. Mediastinum/Nodes: Mediastinal lymph nodes are not enlarged  by CT size criteria. No hilar or axillary lymph nodes. Esophagus is grossly unremarkable. Lungs/Pleura: Biapical pleuroparenchymal scarring. 2 mm peripheral lingular nodule (series 7, image 94), unchanged from 11/19/2010 and considered benign. 3 mm left lower lobe nodule (series 7, image 148), likely stable from 02/19/2013, given differences in slice thickness. Lungs are otherwise clear. No pleural fluid. Airway is unremarkable. Musculoskeletal: Degenerative changes in the spine. No worrisome lytic or sclerotic lesions. CT ABDOMEN PELVIS FINDINGS Hepatobiliary: Subcentimeter low-attenuation lesion in the periphery of the right hepatic lobe is too small to characterize. Liver and gallbladder are otherwise unremarkable. No biliary ductal dilatation. Pancreas: Negative. Spleen: Negative. Adrenals/Urinary Tract: Adrenal glands are unremarkable. Subcentimeter low-attenuation lesions in the kidneys are too small to characterize but statistically, cysts are most likely. Ureters may be minimally prominent. Bladder is displaced to the right due to mass effect from adjacent mass. Stomach/Bowel: Small hiatal hernia. Stomach is decompressed. Stomach, small bowel and colon are unremarkable. Appendix is not readily visualized. Vascular/Lymphatic: Atherosclerotic calcification of the arterial vasculature without abdominal aortic aneurysm. No pathologically enlarged lymph nodes. Reproductive: Uterus is visualized. The endometrial cavity may be dilated and contains fluid. There is a significant amount of streak artifact in this area from a right hip arthroplasty, however. Complex left adnexal mass measures 7.6 x 12.9 cm, previously 8.4 x 15.2 cm. It exerts mass effect on the uterus and bladder. Other: Small scattered ascites,  slightly increased. Right paracolic peritoneal nodule measures 1.8 cm (series 2, image 94), previously 2.3 cm. Left paramidline omental nodule measures 1.3 cm (image 74) and is new. Tiny periumbilical hernia contains fluid. Musculoskeletal: Right hip arthroplasty. Degenerative changes in the spine. No worrisome lytic or sclerotic lesions. IMPRESSION: 1. New omental nodule. Otherwise, left ovarian mass and right paracolic gutter nodule have decreased in size in the interval. 2. Small ascites, increased. 3. Endometrial cavity may be dilated and contains fluid. Please correlate clinically. 4. Mild prominence of the ureters bilaterally, possibly due to large left adnexal mass. 5. Aortic atherosclerosis (ICD10-170.0). Coronary artery calcification. Electronically Signed   By: Lorin Picket M.D.   On: 04/10/2018 14:05   Ct Abdomen Pelvis W Contrast  Result Date: 04/10/2018 CLINICAL DATA:  Ovarian cancer, chemotherapy in progress. Fatigue and bloating. EXAM: CT CHEST, ABDOMEN, AND PELVIS WITH CONTRAST TECHNIQUE: Multidetector CT imaging of the chest, abdomen and pelvis was performed following the standard protocol during bolus administration of intravenous contrast. CONTRAST:  148m OMNIPAQUE IOHEXOL 300 MG/ML  SOLN COMPARISON:  CT abdomen pelvis 02/23/2018 and 02/19/2013. CT chest 11/19/2010. FINDINGS: CT CHEST FINDINGS Cardiovascular: Right IJ Port-A-Cath terminates in the low SVC. Atherosclerotic calcification of the arterial vasculature, including coronary arteries and aortic valve. Heart is at the upper limits of normal in size to mildly enlarged. No pericardial effusion. Mediastinum/Nodes: Mediastinal lymph nodes are not enlarged by CT size criteria. No hilar or axillary lymph nodes. Esophagus is grossly unremarkable. Lungs/Pleura: Biapical pleuroparenchymal scarring. 2 mm peripheral lingular nodule (series 7, image 94), unchanged from 11/19/2010 and considered benign. 3 mm left lower lobe nodule (series 7,  image 148), likely stable from 02/19/2013, given differences in slice thickness. Lungs are otherwise clear. No pleural fluid. Airway is unremarkable. Musculoskeletal: Degenerative changes in the spine. No worrisome lytic or sclerotic lesions. CT ABDOMEN PELVIS FINDINGS Hepatobiliary: Subcentimeter low-attenuation lesion in the periphery of the right hepatic lobe is too small to characterize. Liver and gallbladder are otherwise unremarkable. No biliary ductal dilatation. Pancreas: Negative. Spleen: Negative. Adrenals/Urinary Tract: Adrenal glands are unremarkable. Subcentimeter  low-attenuation lesions in the kidneys are too small to characterize but statistically, cysts are most likely. Ureters may be minimally prominent. Bladder is displaced to the right due to mass effect from adjacent mass. Stomach/Bowel: Small hiatal hernia. Stomach is decompressed. Stomach, small bowel and colon are unremarkable. Appendix is not readily visualized. Vascular/Lymphatic: Atherosclerotic calcification of the arterial vasculature without abdominal aortic aneurysm. No pathologically enlarged lymph nodes. Reproductive: Uterus is visualized. The endometrial cavity may be dilated and contains fluid. There is a significant amount of streak artifact in this area from a right hip arthroplasty, however. Complex left adnexal mass measures 7.6 x 12.9 cm, previously 8.4 x 15.2 cm. It exerts mass effect on the uterus and bladder. Other: Small scattered ascites, slightly increased. Right paracolic peritoneal nodule measures 1.8 cm (series 2, image 94), previously 2.3 cm. Left paramidline omental nodule measures 1.3 cm (image 74) and is new. Tiny periumbilical hernia contains fluid. Musculoskeletal: Right hip arthroplasty. Degenerative changes in the spine. No worrisome lytic or sclerotic lesions. IMPRESSION: 1. New omental nodule. Otherwise, left ovarian mass and right paracolic gutter nodule have decreased in size in the interval. 2. Small  ascites, increased. 3. Endometrial cavity may be dilated and contains fluid. Please correlate clinically. 4. Mild prominence of the ureters bilaterally, possibly due to large left adnexal mass. 5. Aortic atherosclerosis (ICD10-170.0). Coronary artery calcification. Electronically Signed   By: Lorin Picket M.D.   On: 04/10/2018 14:05   .   Outpatient Encounter Medications as of 04/12/2018  Medication Sig  . acetaminophen (TYLENOL) 500 MG tablet Take 500 mg by mouth every 6 (six) hours as needed for mild pain.  Marland Kitchen amLODipine (NORVASC) 5 MG tablet Take 5 mg by mouth daily at 6 PM.  . dexamethasone (DECADRON) 4 MG tablet Take 3 tabs at the night before and 3 tab the morning of chemotherapy, every 3 weeks, by mouth  . ibuprofen (ADVIL,MOTRIN) 200 MG tablet Take 200 mg by mouth daily as needed. For pain   . levothyroxine (SYNTHROID, LEVOTHROID) 88 MCG tablet Take 88 mcg by mouth daily before breakfast.  . lidocaine-prilocaine (EMLA) cream Apply to affected area once  . loratadine (CLARITIN) 10 MG tablet Take 10 mg by mouth daily as needed for allergies (for congestion).   . LORazepam (ATIVAN) 0.5 MG tablet Take 1 tablet (0.5 mg total) by mouth 2 (two) times daily as needed for anxiety or sedation.  . metoprolol succinate (TOPROL-XL) 50 MG 24 hr tablet Take 50 mg by mouth daily. In the morning  . Multiple Vitamins-Minerals (MULTIVITAMINS THER. W/MINERALS) TABS Take 1 tablet by mouth daily.    Marland Kitchen olmesartan (BENICAR) 40 MG tablet Take 40 mg by mouth daily at 6 PM.   . ondansetron (ZOFRAN ODT) 4 MG disintegrating tablet Take 1 tablet (4 mg total) by mouth every 8 (eight) hours as needed for nausea or vomiting.  . ondansetron (ZOFRAN) 8 MG tablet Take 1 tablet (8 mg total) by mouth every 8 (eight) hours as needed for refractory nausea / vomiting. Start on day 3 after chemo.  . prochlorperazine (COMPAZINE) 10 MG tablet Take 1 tablet (10 mg total) by mouth every 6 (six) hours as needed (Nausea or vomiting).    No facility-administered encounter medications on file as of 04/12/2018.    No Known Allergies  Past Medical History:  Diagnosis Date  . Allergy   . Anemia   . Blood transfusion without reported diagnosis   . Cancer ([place])   . Complication of anesthesia  waking up-does not take much medication  . Family history of adverse reaction to anesthesia    problems waking up as does not need much medication  . Hypertension   . Hypothyroidism   . Osteoporosis 10/07   Dexa scan  . PONV (postoperative nausea and vomiting)    Past Surgical History:  Procedure Laterality Date  . APPENDECTOMY  1950   not ruptured  . IR IMAGING GUIDED PORT INSERTION  02/11/2018  . LAPAROSCOPY N/A 01/31/2018   Procedure: LAPAROSCOPY DIAGNOSTIC WITH BIOPSY;  Surgeon: [name], MD;  Location: [facility];  Service: Gynecology;  Laterality: N/A;  . ovarian biopsy    . THYROIDECTOMY    . TONSILLECTOMY    . TOTAL HIP ARTHROPLASTY Right       Past Gynecological History:   GYNECOLOGIC HISTORY:  No LMP recorded. Patient is postmenopausal. . Menarche: 84 years old . P 4 . LMP 84 yo . Contraceptive 2 years OCP . HRT None  . Last Pap 12/2017 negative but no TZone seen due to atrophy Family Hx:  Family History  Problem Relation Age of Onset  . Diabetes Mother   . Stroke Mother   . Breast cancer Sister 30       breast ca  . Diabetes Sister   . Lung cancer Brother   . Stroke Brother    Social Hx:  Marland Kitchen Tobacco use: none . Alcohol use: none . Illicit Drug use: none . Illicit IV Drug use: none    Review of Systems: Review of Systems  All other systems reviewed and are negative. Wound leakage at umbilicus  Vitals: Blood pressure (!) 152/68, pulse 82, temperature (!) 97.5 F (36.4 C), temperature source Oral, resp. rate 20, height _0  (1.676 m), weight 166 lb 3.2 oz (75.4 kg), SpO2 97 %. Vitals:   04/12/18 1000  Weight: 163 lb 4 oz (74 kg)  Height: _1  (1.676 m)    Body mass index is 26.35  kg/m.   Physical Exam:   General :  Well developed, 84 y.o., female in no apparent distress HEENT:  Normocephalic/atraumatic, symmetric, EOMI, eyelids normal Neck:   Supple, no masses.  Lymphatics:  No cervical/ submandibular/ supraclavicular/ infraclavicular/ inguinal adenopathy Respiratory:  Respirations unlabored, no use of accessory muscles CV:   Deferred Breast:  Deferred Musculoskeletal: No CVA tenderness, normal muscle strength. Abdomen:  Soft, non-tender and nondistended. No evidence of hernia. No masses. Extremities:  No lymphedema, no erythema, non-tender. Skin:   Normal inspection Neuro/Psych:  No focal motor deficit, no abnormal mental status. Normal gait. Normal affect. Alert and oriented to person, place, and time  Genito Urinary: Vulva: Normal external female genitalia.  Bladder/urethra: Urethral meatus normal in size and location. No lesions or   masses, well supported bladder Bimanual exam: No lesions or masses Rectovaginal:  No further nodular disease palpated on exam. Good tone, no masses, no cul de sac nodularity, no parametrial involvement or nodularity.  from 01/10/18 exam Bimanual exam: palpable disease in culdesac, nodular, no breakthrough to vagina.  Uterus: Normal size, mobile.  Adnexa: palpable disease in culdesac deviating to patient's left.  Rectovaginal:  + disease palpable but no breakthrough into rectal mucsosa. Cannot separate from anterior rectal wall. Starting about 6-7cm from anal verge. Good tone, no parametrial involvement   Assessment  Pelvic mass with paraaortic adenopathy and omental disease  Plan  1. Response o Going based soley on the radiology reading this is progression of disease, however, I would like radiology  to review with [place] at Tumor Board on Monday o If they feel there is progression then surgery will not be beneficial and change in chemo would be recommended. o However if this is not progression then interval debulking  reasonable. 2. Surgery consideration o Given the high CA125 I still worry about carcinomatosis and inability to achieve optimal debulking in this 84 yo woman. 3. Treatment plan o For now plan on chemo next week. o Tumor Board for further decision making 4. I personally reviewed the images with the family and patient today and expressed my interpretation with them.   Face to face time with patient was ~67mnutes. Over 50% of this time was spent on counseling and coordination of care.    SIsabel Caprice MD  04/12/2018, 1:24 PM   Cc: SMarton Redwood MD (Referring PCP) NHeath Lark MD (Medical Oncology)

## 2018-04-12 ENCOUNTER — Encounter: Payer: Self-pay | Admitting: Obstetrics

## 2018-04-12 ENCOUNTER — Telehealth: Payer: Self-pay

## 2018-04-12 ENCOUNTER — Encounter: Payer: Self-pay | Admitting: Hematology and Oncology

## 2018-04-12 ENCOUNTER — Inpatient Hospital Stay (HOSPITAL_BASED_OUTPATIENT_CLINIC_OR_DEPARTMENT_OTHER): Payer: Medicare Other | Admitting: Obstetrics

## 2018-04-12 VITALS — BP 170/85 | HR 75 | Temp 97.7°F | Resp 18 | Ht 66.0 in | Wt 163.2 lb

## 2018-04-12 DIAGNOSIS — G620 Drug-induced polyneuropathy: Secondary | ICD-10-CM | POA: Diagnosis not present

## 2018-04-12 DIAGNOSIS — C569 Malignant neoplasm of unspecified ovary: Secondary | ICD-10-CM

## 2018-04-12 DIAGNOSIS — C786 Secondary malignant neoplasm of retroperitoneum and peritoneum: Secondary | ICD-10-CM | POA: Diagnosis not present

## 2018-04-12 DIAGNOSIS — T451X5A Adverse effect of antineoplastic and immunosuppressive drugs, initial encounter: Secondary | ICD-10-CM | POA: Diagnosis not present

## 2018-04-12 DIAGNOSIS — Z5111 Encounter for antineoplastic chemotherapy: Secondary | ICD-10-CM | POA: Diagnosis not present

## 2018-04-12 DIAGNOSIS — I10 Essential (primary) hypertension: Secondary | ICD-10-CM | POA: Diagnosis not present

## 2018-04-12 NOTE — Progress Notes (Signed)
Pt came in with daughter but did not bring proof of income so she will bring it on 04/19/18.  Left note to see Shauna.

## 2018-04-12 NOTE — Telephone Encounter (Signed)
She called and left message that she is having cough and congestion with no fever.  Called back per Dr. Alvy Bimler, instructed to take OTC medications. Go to urgent care if needed and call back Monday with update.

## 2018-04-12 NOTE — Patient Instructions (Signed)
1) We will discuss your case at tumor conference on Monday.  We will contact you with the recommendations in regards to proceeding with chemotherapy or moving forward with surgery.

## 2018-04-15 ENCOUNTER — Encounter: Payer: Self-pay | Admitting: Oncology

## 2018-04-15 ENCOUNTER — Telehealth: Payer: Self-pay | Admitting: Hematology and Oncology

## 2018-04-15 NOTE — Progress Notes (Signed)
Gynecologic Oncology Multi-Disciplinary Disposition Conference Note  Date of the Conference: 04/15/2018   Patient Name: Shannon Ellis  Referring Provider: Primary GYN Oncologist:  Stage/Disposition:  Stage IIIC serous ovarian cancer. Disposition is for 1 more cycle of chemotherapy followed by surgery 3 weeks after.   This Multidisciplinary conference took place involving physicians from [place], [place], Radiation Oncology, Pathology, Radiology along with the Gynecologic Oncology Nurse Practitioner and RN.  Comprehensive assessment of the patient's malignancy, staging, need for surgery, chemotherapy, radiation therapy, and need for further testing were reviewed. Supportive measures, both inpatient and following discharge were also discussed. The recommended plan of care is documented. Greater than 35 minutes were spent correlating and coordinating this patient's care.

## 2018-04-15 NOTE — Telephone Encounter (Signed)
NG out of office 10/30 thru 11/29 - moved 11/1 f/u to Dr. Audelia Hives. Spoke with patient she is aware.

## 2018-04-16 ENCOUNTER — Telehealth: Payer: Self-pay | Admitting: *Deleted

## 2018-04-16 ENCOUNTER — Telehealth: Payer: Self-pay | Admitting: Gynecologic Oncology

## 2018-04-16 NOTE — Telephone Encounter (Signed)
Patient's daughter Myriam Forehand called this morning on behalf of her mother Shannon Ellis stating that Mrs. Kierstead would like to have surgery on next Thursday, November 7th, 2019 at [facility] with Dr. Gerarda Fraction.  I informed the daughter that I would let Joylene John, NP aware so that we could add Mrs. Amenta to the surgical  schedule, I also made the daughter aware that our office would call her back with an appointment to see Dr. Gerarda Fraction before the surgery.  Patient's daughter states that Mrs. Westmoreland is scheduled for chemo this Friday November 1st.  I informed patient's daughter that our office will make Dr. Alvy Bimler aware of the decision about surgery for next week.  Patient's daughter verbalized understanding.

## 2018-04-16 NOTE — Telephone Encounter (Signed)
Returned call to patient's daughter. She had called the office with several questions this am.  All questions answered.  Discussed the plan of meeting with Dr. Gerarda Fraction on Friday then surgery on Nov 7.  Advised that all appts related to chemo have been cancelled at this time.  Advised to call for any needs.

## 2018-04-18 NOTE — Progress Notes (Addendum)
Progress Note: Established Patient FIRST VISIT  Consult was originally requested by Dr. Marton Redwood for a palpable mass that on workup was found to be c/w an adnexal mass with associated carcinomatosis and periaortic adenopathy (1.14x2.17cm)  Chief Complaint  Patient presents with  . Malignant neoplasm of ovary, unspecified laterality [facility])   GYN Oncologic Summary 1. Stage 3C Ovarian Cancer  o 01/31/18 - Diagnostic LSC / omental biopsy o 02/15/18 - 03/29/18 Carbo / Taxol (neoadjuvant) Q3 week   HPI: [name]  is an 84 y.o.  P4   Interval History Recall start CA125 was 1492 and she has carcinomatosis/omental cake and a 2.1x1.1 cm paraaortic node.  She completed 3 cycles of chemotherapy ; the last ~3 weeks ago. CA125 increased precycle2, but decreased cycle 3 as noted below. Imaging has been performed post-cycle 3 . 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  Since her last visit we presented her case at Tumor Board and agreement was the lesion being called new omental disease was actually present on her July 2019 scan. It was agreed to proceed to surgical debulking. She returns today to discuss the recommendations and ongoing plan.  No new complaints.   Presenting History She was seen by her PCP, Dr. Brigitte Pulse, for an annual exam and on palpation of the abdomen a mass was noted. Workup included a CTScan AP 01/02/18 revealing a large complex pelvic mass, carcinomatosis with omental nodularity, and associated left periaortic adenopathy (measured by me at 1.14 x 2.17cm). No chest imaging.  01/03/18 CA125 was obtained and equal to 1492. She was therefore referred for recommendations.  She has noted some fatigue and apathy. No pain. She discusses some discomfort that she first noted 3 years ago in her LLQ but the family states it was elsewhere that she was having pain then. Denies bleeding, vaginal dc. She has had about 3-4 months of early  satiety but no weight loss, no reflux. Some slight constipation but no big change in bowel movements. Last colonoscopy was 2-3 years ago.   Biopsy of the omentum -  returned 01/21/2018 as poorly differentiated carcinoma with stains +PAX8, WT1, ER/PR, CK7, and +AE1/AE3, PLAP. Negative for multiple other markers with a read the morphology and immunophenotype are suggestive of a germ cell tumor.  Given the unusual histology additional sampling was preferred.  I took her 01/31/18 and performed laparoscopic biopsies of the omentum. This has been reviewed by [facility] and returned with likely EOC. Hepatoid features may be present and additional stains could be done to add to final diagnosis.  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 - SEE MICROSCOPIC DESCRIPTION. Microscopic Comment This case is sent to Dr. Annitta Jersey for consultation. The case was discussed with Dr. Gerarda Fraction on 02/01/18. (JDP:kh 02/01/18) The omentum shows nodules of high grade carcinoma characterized by diffuse sheets of malignant cells with enlarged nuclei with prominent nucleoli. There is moderate to abundant eosinophilic cytoplasm and frequent mitotic figures. 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 The entire specimen is examined histologically and there are nodules of tumor throughout the specimen with identical morphology. Additional immunohistochemistry for hepatoid markers will be performed and reported as an addendum.  Measurement  of disease:  CA125 . 01/04/18 1492 (preop)  . 02/14/18 (C1) 1523 . 03/05/18 (precycle 2) 1844 . 03/29/18 (C3)  1141 --> Interval Debulking planned     Recent Labs    01/31/18 1415 02/14/18 1505  03/05/18 1016 03/29/18 0944  [id number]  --  1,523.0* 1,844.0* 1,141.0*  INHBB <7.0  --   --   --       Radiology:   01/02/18 CTAP - FINDINGS:l ower chest: 6 mm subpleural right lower lobe nodule is unchanged and considered benign. Mild scarring in both lower lobes. Heart is at the upper limits of normal in size. No pericardial or pleural effusion. Hepatobiliary: Liver and gallbladder are unremarkable. No biliary ductal dilatation. Pancreas: Negative. Spleen: Negative. Adrenals/Urinary Tract: Adrenal glands are unremarkable. Subcentimeter low-attenuation lesions in the kidneys are too small to characterize. Ureters are decompressed. Bladder is low in volume and largely obscured by streak artifact from a right hip arthroplasty.  Stomach/Bowel: Stomach, small bowel and colon are unremarkable. Appendix is not readily visualized.  Vascular/Lymphatic: Atherosclerotic calcification of the arterial vasculature without abdominal aortic aneurysm. Left periaortic lymph node measures 11 mm (image 28).  Reproductive: Low pelvic structures are somewhat obscured by streak artifact from a right hip arthroplasty. There is a large heterogeneous mass in the left paramidline pelvis, measuring 14.2 x 8.1 x 11.4 cm, with associated cystic components and/or fluid. Left ovary is not visualized as a separate structure. Uterus is seen to the right of the chest described mass (series 2, image 65).  Other: Small pelvic free fluid. Omental haziness and nodularity are seen with an index omental nodule anterior to the gallbladder measuring 1.5 cm.  Musculoskeletal: Right hip arthroplasty. Degenerative changes in the spine. No worrisome lytic or sclerotic lesions.  IMPRESSION: 1. Large complex left paramidline pelvic mass likely rises from the left ovary. Together with left periaortic adenopathy and peritoneal carcinomatosis, findings are worrisome for metastatic ovarian cancer. 2. Small pelvic free fluid. 3.  Aortic atherosclerosis    02/23/18 CTCAP - COMPARISON:  08/05/2019FINDINGS:Lower chest: No acute abnormality.Hepatobiliary: No focal liver abnormality is seen. No gallstones,gallbladder wall thickening, or biliary dilatation.Pancreas: Unremarkable. Nopancreatic ductal dilatation orsurrounding inflammatory changes.Spleen: Normal in size without focal abnormality.Adrenals/Urinary Tract: Mild right hydronephrosis and hydroureter.No evidence of obstructing calculi.Stomach/Bowel:Small hiatal hernia. Normal appearance of thestomach, small and large bowel.Vascular/Lymphatic: Calcific atherosclerotic disease of the aorta.Again seen is left periaortic lymph node. Soft tissue noduleadjacentto the cecum measures 2.3 cm, stable. Again seen is omentalhaziness. The index omental mass anterior to the gallbladder hasgreatly decreased in size.Reproductive: The large multilobular left paramedial pelvic massmeasures 1 5 by 8.4 cm. Again seen is associated cystic componentand adjacent loculated fluid.Other: Interval development of abdominal ascites.Musculoskeletal: No suspicious osseous lesions. Spondylosis of thelumbosacralspine. Post right hip arthroplasty.IMPRESSION:Interval development of abdominal ascites.Decrease in omental carcinomatosis, likely due to chemotherapy.No significant change in multilobulated complex left pelvicmass,which compresses the pelvic structures and displaces them to theright.Interval development of mild right hydronephrosis and hydroureter,possibly postobstructive.  04/10/18 - CTCAP -     Ct Chest W Contrast  Result Date: 04/10/2018 CLINICAL DATA:  Ovarian cancer, chemotherapy in progress. Fatigue and bloating. EXAM: CT CHEST, ABDOMEN, AND PELVIS WITH CONTRAST TECHNIQUE: Multidetector CT imaging of the chest, abdomen and pelvis was performed following the standard protocol during bolus administration of intravenous contrast. CONTRAST:  170mL OMNIPAQUE IOHEXOL 300 MG/ML  SOLN COMPARISON:  CT abdomen pelvis  02/23/2018 and 02/19/2013. CT chest 11/19/2010. FINDINGS: CT CHEST FINDINGS Cardiovascular: Right IJ Port-A-Cath terminates in the low SVC.  Atherosclerotic calcification of the arterial vasculature, including coronary arteries and aortic valve. Heart is at the upper limits of normal in size to mildly enlarged. No pericardial effusion. Mediastinum/Nodes: Mediastinal lymph nodes are not enlarged by CT size criteria. No hilar or axillary lymph nodes. Esophagus is grossly unremarkable. Lungs/Pleura: Biapical pleuroparenchymal scarring. 2 mm peripheral lingular nodule (series 7, image 94), unchanged from 11/19/2010 and considered benign. 3 mm left lower lobe nodule (series 7, image 148), likely stable from 02/19/2013, given differences in slice thickness. Lungs are otherwise clear. No pleural fluid. Airway is unremarkable. Musculoskeletal: Degenerative changes in the spine. No worrisome lytic or sclerotic lesions. CT ABDOMEN PELVIS FINDINGS Hepatobiliary: Subcentimeter low-attenuation lesion in the periphery of the right hepatic lobe is too small to characterize. Liver and gallbladder are otherwise unremarkable. No biliary ductal dilatation. Pancreas: Negative. Spleen: Negative. Adrenals/Urinary Tract: Adrenal glands are unremarkable. Subcentimeter low-attenuation lesions in the kidneys are too small to characterize but statistically, cysts are most likely. Ureters may be minimally prominent. Bladder is displaced to the right due to mass effect from adjacent mass. Stomach/Bowel: Small hiatal hernia. Stomach is decompressed. Stomach, small bowel and colon are unremarkable. Appendix is not readily visualized. Vascular/Lymphatic: Atherosclerotic calcification of the arterial vasculature without abdominal aortic aneurysm. No pathologically enlarged lymph nodes. Reproductive: Uterus is visualized. The endometrial cavity may be dilated and contains fluid. There is a significant amount of streak artifact in this area from a  right hip arthroplasty, however. Complex left adnexal mass measures 7.6 x 12.9 cm, previously 8.4 x 15.2 cm. It exerts mass effect on the uterus and bladder. Other: Small scattered ascites, slightly increased. Right paracolic peritoneal nodule measures 1.8 cm (series 2, image 94), previously 2.3 cm. Left paramidline omental nodule measures 1.3 cm (image 74) and is new. Tiny periumbilical hernia contains fluid. Musculoskeletal: Right hip arthroplasty. Degenerative changes in the spine. No worrisome lytic or sclerotic lesions. IMPRESSION: 1. New omental nodule. Otherwise, left ovarian mass and right paracolic gutter nodule have decreased in size in the interval. 2. Small ascites, increased. 3. Endometrial cavity may be dilated and contains fluid. Please correlate clinically. 4. Mild prominence of the ureters bilaterally, possibly due to large left adnexal mass. 5. Aortic atherosclerosis (ICD10-170.0). Coronary artery calcification. Electronically Signed   By: Lorin Picket M.D.   On: 04/10/2018 14:05   Ct Abdomen Pelvis W Contrast  Result Date: 04/10/2018 CLINICAL DATA:  Ovarian cancer, chemotherapy in progress. Fatigue and bloating. EXAM: CT CHEST, ABDOMEN, AND PELVIS WITH CONTRAST TECHNIQUE: Multidetector CT imaging of the chest, abdomen and pelvis was performed following the standard protocol during bolus administration of intravenous contrast. CONTRAST:  162mL OMNIPAQUE IOHEXOL 300 MG/ML  SOLN COMPARISON:  CT abdomen pelvis 02/23/2018 and 02/19/2013. CT chest 11/19/2010. FINDINGS: CT CHEST FINDINGS Cardiovascular: Right IJ Port-A-Cath terminates in the low SVC. Atherosclerotic calcification of the arterial vasculature, including coronary arteries and aortic valve. Heart is at the upper limits of normal in size to mildly enlarged. No pericardial effusion. Mediastinum/Nodes: Mediastinal lymph nodes are not enlarged by CT size criteria. No hilar or axillary lymph nodes. Esophagus is grossly unremarkable.  Lungs/Pleura: Biapical pleuroparenchymal scarring. 2 mm peripheral lingular nodule (series 7, image 94), unchanged from 11/19/2010 and considered benign. 3 mm left lower lobe nodule (series 7, image 148), likely stable from 02/19/2013, given differences in slice thickness. Lungs are otherwise clear. No pleural fluid. Airway is unremarkable. Musculoskeletal: Degenerative changes in the spine. No worrisome lytic or sclerotic lesions. CT ABDOMEN PELVIS FINDINGS Hepatobiliary: Subcentimeter low-attenuation  lesion in the periphery of the right hepatic lobe is too small to characterize. Liver and gallbladder are otherwise unremarkable. No biliary ductal dilatation. Pancreas: Negative. Spleen: Negative. Adrenals/Urinary Tract: Adrenal glands are unremarkable. Subcentimeter low-attenuation lesions in the kidneys are too small to characterize but statistically, cysts are most likely. Ureters may be minimally prominent. Bladder is displaced to the right due to mass effect from adjacent mass. Stomach/Bowel: Small hiatal hernia. Stomach is decompressed. Stomach, small bowel and colon are unremarkable. Appendix is not readily visualized. Vascular/Lymphatic: Atherosclerotic calcification of the arterial vasculature without abdominal aortic aneurysm. No pathologically enlarged lymph nodes. Reproductive: Uterus is visualized. The endometrial cavity may be dilated and contains fluid. There is a significant amount of streak artifact in this area from a right hip arthroplasty, however. Complex left adnexal mass measures 7.6 x 12.9 cm, previously 8.4 x 15.2 cm. It exerts mass effect on the uterus and bladder. Other: Small scattered ascites, slightly increased. Right paracolic peritoneal nodule measures 1.8 cm (series 2, image 94), previously 2.3 cm. Left paramidline omental nodule measures 1.3 cm (image 74) and is new. Tiny periumbilical hernia contains fluid. Musculoskeletal: Right hip arthroplasty. Degenerative changes in the spine.  No worrisome lytic or sclerotic lesions. IMPRESSION: 1. New omental nodule. Otherwise, left ovarian mass and right paracolic gutter nodule have decreased in size in the interval. 2. Small ascites, increased. 3. Endometrial cavity may be dilated and contains fluid. Please correlate clinically. 4. Mild prominence of the ureters bilaterally, possibly due to large left adnexal mass. 5. Aortic atherosclerosis (ICD10-170.0). Coronary artery calcification. Electronically Signed   By: Lorin Picket M.D.   On: 04/10/2018 14:05   .   Outpatient Encounter Medications as of 04/19/2018  Medication Sig  . acetaminophen (TYLENOL) 500 MG tablet Take 500 mg by mouth every 6 (six) hours as needed for mild pain.  Marland Kitchen amLODipine (NORVASC) 5 MG tablet Take 5 mg by mouth daily at 6 PM.  . dexamethasone (DECADRON) 4 MG tablet Take 3 tabs at the night before and 3 tab the morning of chemotherapy, every 3 weeks, by mouth  . levothyroxine (SYNTHROID, LEVOTHROID) 88 MCG tablet Take 88 mcg by mouth daily before breakfast.  . lidocaine-prilocaine (EMLA) cream Apply to affected area once  . loratadine (CLARITIN) 10 MG tablet Take 10 mg by mouth daily as needed for allergies (for congestion).   . LORazepam (ATIVAN) 0.5 MG tablet Take 1 tablet (0.5 mg total) by mouth 2 (two) times daily as needed for anxiety or sedation.  . metoprolol succinate (TOPROL-XL) 50 MG 24 hr tablet Take 50 mg by mouth daily. In the morning  . Multiple Vitamins-Minerals (MULTIVITAMINS THER. W/MINERALS) TABS Take 1 tablet by mouth daily.    Marland Kitchen olmesartan (BENICAR) 40 MG tablet Take 40 mg by mouth daily at 6 PM.   . ondansetron (ZOFRAN ODT) 4 MG disintegrating tablet Take 1 tablet (4 mg total) by mouth every 8 (eight) hours as needed for nausea or vomiting.  . ondansetron (ZOFRAN) 8 MG tablet Take 1 tablet (8 mg total) by mouth every 8 (eight) hours as needed for refractory nausea / vomiting. Start on day 3 after chemo.  . prochlorperazine (COMPAZINE) 10 MG  tablet Take 1 tablet (10 mg total) by mouth every 6 (six) hours as needed (Nausea or vomiting).  . [DISCONTINUED] ibuprofen (ADVIL,MOTRIN) 200 MG tablet Take 200 mg by mouth daily as needed. For pain    No facility-administered encounter medications on file as of 04/19/2018.    No  Known Allergies  Past Medical History:  Diagnosis Date  . Allergy   . Anemia   . Blood transfusion without reported diagnosis   . Cancer ([place])   . Complication of anesthesia    waking up-does not take much medication  . Family history of adverse reaction to anesthesia    problems waking up as does not need much medication  . Hypertension   . Hypothyroidism   . Osteoporosis 10/07   Dexa scan  . PONV (postoperative nausea and vomiting)    Past Surgical History:  Procedure Laterality Date  . APPENDECTOMY  1950   not ruptured  . IR IMAGING GUIDED PORT INSERTION  02/11/2018  . LAPAROSCOPY N/A 01/31/2018   Procedure: LAPAROSCOPY DIAGNOSTIC WITH BIOPSY;  Surgeon: [name], MD;  Location: [facility];  Service: Gynecology;  Laterality: N/A;  . ovarian biopsy    . THYROIDECTOMY    . TONSILLECTOMY    . TOTAL HIP ARTHROPLASTY Right       Past Gynecological History:   GYNECOLOGIC HISTORY:  No LMP recorded. Patient is postmenopausal. . Menarche: 84 years old . P 4 . LMP 84 yo . Contraceptive 2 years OCP . HRT None  . Last Pap 12/2017 negative but no TZone seen due to atrophy Family Hx:  Family History  Problem Relation Age of Onset  . Diabetes Mother   . Stroke Mother   . Breast cancer Sister 78       breast ca  . Diabetes Sister   . Lung cancer Brother   . Stroke Brother    Social Hx:  Marland Kitchen Tobacco use: none . Alcohol use: none . Illicit Drug use: none . Illicit IV Drug use: none    Review of Systems: Review of Systems  Constitutional: Positive for fatigue.  HENT:   Positive for hearing loss.   Gastrointestinal: Positive for abdominal distention.  Psychiatric/Behavioral: The patient is  nervous/anxious.   All other systems reviewed and are negative. + early satiety   Vitals: Blood pressure (!) 152/68, pulse 82, temperature (!) 97.5 F (36.4 C), temperature source Oral, resp. rate 20, height 5\' 6"  (1.676 m), weight 166 lb 3.2 oz (75.4 kg), SpO2 97 %. Vitals:   04/19/18 0943  Weight: 162 lb 9.6 oz (73.8 kg)  Height: 5\' 6"  (1.676 m)    Body mass index is 26.24 kg/m.   Physical Exam:   General :  Well developed, 84 y.o., female in no apparent distress HEENT:  Normocephalic/atraumatic, symmetric, EOMI, eyelids normal Neck:   No visible masses.  Respiratory:  Respirations unlabored, no use of accessory muscles CV:   Deferred Breast:  Deferred Musculoskeletal: Normal muscle strength. Abdomen:  No visible masses or protrusion Extremities:  No visible edema or deformities Skin:   Normal inspection Neuro/Psych:  No focal motor deficit, no abnormal mental status. Normal gait. Normal affect. Alert and oriented to person, place, and time  GU :   Deferred today  Genito Urinary: From 04/12/18 exam Vulva: Normal external female genitalia.  Bladder/urethra: Urethral meatus normal in size and location. No lesions or   masses, well supported bladder Bimanual exam: No lesions or masses Rectovaginal:  No further nodular disease palpated on exam. Good tone, no masses, no cul de sac nodularity, no parametrial involvement or nodularity.  from 01/10/18 exam Bimanual exam: palpable disease in culdesac, nodular, no breakthrough to vagina.  Uterus: Normal size, mobile.  Adnexa: palpable disease in culdesac deviating to patient's left.  Rectovaginal:  + disease palpable  but no breakthrough into rectal mucsosa. Cannot separate from anterior rectal wall. Starting about 6-7cm from anal verge. Good tone, no parametrial involvement   Assessment  Pelvic mass with paraaortic adenopathy and omental disease  Plan  1. Response o We confirmed at Tumor Board she looks to be responding and  the omental nodule was present back in July 2019. 2. Surgery consideration o Surgery is reasonable at this time. There is still risk of suboptimal debulking and bowel resection. o The left paraaortic lymph node is high just under the left renal vessels. It is now non-pathologic size and so I won't be planning to resect.  o The surgical sketch was reviewed along with the risks, benefits, and alternatives. She was given a copy of this at the end of the visit. i. We discussed the possibility of inability to remove all disease (specifically the high left PALN), bowel resection, ostomy, ICU stay.  3. Treatment plan o We reviewed the need for chemo following surgery and she confirms she plan to complete out the 6 cycles as previously discussed.   Face to face time with patient was ~30 minutes. Over 50% of this time was spent on counseling and coordination of care.    [name], MD  04/19/2018, 10:49 AM   Cc: Marton Redwood, MD (Referring PCP) [name], MD (Medical Oncology)

## 2018-04-18 NOTE — H&P (View-Only) (Signed)
[place] at [facility]   Progress Note: Established Patient FIRST VISIT  Consult was originally requested by Dr. Marton Redwood for a palpable mass that on workup was found to be c/w an adnexal mass with associated carcinomatosis and periaortic adenopathy (1.14x2.17cm)  Chief Complaint  Patient presents with  . Malignant neoplasm of ovary, unspecified laterality [facility])   GYN Oncologic Summary 1. Stage 3C Ovarian Cancer  o 01/31/18 - Diagnostic LSC / omental biopsy o 02/15/18 - 03/29/18 Carbo / Taxol (neoadjuvant) Q3 week   HPI: [name]  is an 84 y.o.  P4   Interval History Recall start CA125 was 1492 and she has carcinomatosis/omental cake and a 2.1x1.1 cm paraaortic node.  She completed 3 cycles of chemotherapy ; the last ~3 weeks ago. CA125 increased precycle2, but decreased cycle 3 as noted below. Imaging has been performed post-cycle 3 . 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  Since her last visit we presented her case at Tumor Board and agreement was the lesion being called new omental disease was actually present on her July 2019 scan. It was agreed to proceed to surgical debulking. She returns today to discuss the recommendations and ongoing plan.  No new complaints.   Presenting History She was seen by her PCP, Dr. Brigitte Pulse, for an annual exam and on palpation of the abdomen a mass was noted. Workup included a CTScan AP 01/02/18 revealing a large complex pelvic mass, carcinomatosis with omental nodularity, and associated left periaortic adenopathy (measured by me at 1.14 x 2.17cm). No chest imaging.  01/03/18 CA125 was obtained and equal to 1492. She was therefore referred for recommendations.  She has noted some fatigue and apathy. No pain. She discusses some discomfort that she first noted 3 years ago in her LLQ but the family states it was elsewhere that she was having pain  then. Denies bleeding, vaginal dc. She has had about 3-4 months of early satiety but no weight loss, no reflux. Some slight constipation but no big change in bowel movements. Last colonoscopy was 2-3 years ago.   Biopsy of the omentum -  returned 01/21/2018 as poorly differentiated carcinoma with stains +PAX8, WT1, ER/PR, CK7, and +AE1/AE3, PLAP. Negative for multiple other markers with a read the morphology and immunophenotype are suggestive of a germ cell tumor.  Given the unusual histology additional sampling was preferred.  I took her 01/31/18 and performed laparoscopic biopsies of the omentum. This has been reviewed by [facility] and returned with likely EOC. Hepatoid features may be present and additional stains could be done to add to final diagnosis.  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 - SEE MICROSCOPIC DESCRIPTION. Microscopic Comment This case is sent to Dr. Annitta Jersey for consultation. The case was discussed with Dr. Gerarda Fraction on 02/01/18. (JDP:kh 02/01/18) The omentum shows nodules of high grade carcinoma characterized by diffuse sheets of malignant cells with enlarged nuclei with prominent nucleoli. There is moderate to abundant eosinophilic cytoplasm and frequent mitotic figures. 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 The entire specimen is examined histologically and there are nodules of tumor throughout the specimen with identical morphology. Additional immunohistochemistry for  hepatoid markers will be performed and reported as an addendum.  Measurement of disease:  CA125 . 01/04/18 1492 (preop)  . 02/14/18 (C1) 1523 . 03/05/18 (precycle 2) 1844 . 03/29/18 (C3)  1141 --> Interval  Debulking planned     Recent Labs    01/31/18 1415 02/14/18 1505 03/05/18 1016 03/29/18 0944  [id number]  --  1,523.0* 1,844.0* 1,141.0*  INHBB <7.0  --   --   --       Radiology:   01/02/18 CTAP - FINDINGS:l ower chest: 6 mm subpleural right lower lobe nodule is unchanged and considered benign. Mild scarring in both lower lobes. Heart is at the upper limits of normal in size. No pericardial or pleural effusion. Hepatobiliary: Liver and gallbladder are unremarkable. No biliary ductal dilatation. Pancreas: Negative. Spleen: Negative. Adrenals/Urinary Tract: Adrenal glands are unremarkable. Subcentimeter low-attenuation lesions in the kidneys are too small to characterize. Ureters are decompressed. Bladder is low in volume and largely obscured by streak artifact from a right hip arthroplasty.  Stomach/Bowel: Stomach, small bowel and colon are unremarkable. Appendix is not readily visualized.  Vascular/Lymphatic: Atherosclerotic calcification of the arterial vasculature without abdominal aortic aneurysm. Left periaortic lymph node measures 11 mm (image 28).  Reproductive: Low pelvic structures are somewhat obscured by streak artifact from a right hip arthroplasty. There is a large heterogeneous mass in the left paramidline pelvis, measuring 14.2 x 8.1 x 11.4 cm, with associated cystic components and/or fluid. Left ovary is not visualized as a separate structure. Uterus is seen to the right of the chest described mass (series 2, image 65).  Other: Small pelvic free fluid. Omental haziness and nodularity are seen with an index omental nodule anterior to the gallbladder measuring 1.5 cm.  Musculoskeletal: Right hip arthroplasty. Degenerative changes in the spine. No worrisome lytic or sclerotic lesions.  IMPRESSION: 1. Large complex left paramidline pelvic mass likely rises from the left ovary. Together with left periaortic adenopathy and peritoneal carcinomatosis, findings are worrisome for  metastatic ovarian cancer. 2. Small pelvic free fluid. 3.  Aortic atherosclerosis   02/23/18 CTCAP - COMPARISON:  08/05/2019FINDINGS:Lower chest: No acute abnormality.Hepatobiliary: No focal liver abnormality is seen. No gallstones,gallbladder wall thickening, or biliary dilatation.Pancreas: Unremarkable. Nopancreatic ductal dilatation orsurrounding inflammatory changes.Spleen: Normal in size without focal abnormality.Adrenals/Urinary Tract: Mild right hydronephrosis and hydroureter.No evidence of obstructing calculi.Stomach/Bowel:Small hiatal hernia. Normal appearance of thestomach, small and large bowel.Vascular/Lymphatic: Calcific atherosclerotic disease of the aorta.Again seen is left periaortic lymph node. Soft tissue noduleadjacentto the cecum measures 2.3 cm, stable. Again seen is omentalhaziness. The index omental mass anterior to the gallbladder hasgreatly decreased in size.Reproductive: The large multilobular left paramedial pelvic massmeasures 1 5 by 8.4 cm. Again seen is associated cystic componentand adjacent loculated fluid.Other: Interval development of abdominal ascites.Musculoskeletal: No suspicious osseous lesions. Spondylosis of thelumbosacralspine. Post right hip arthroplasty.IMPRESSION:Interval development of abdominal ascites.Decrease in omental carcinomatosis, likely due to chemotherapy.No significant change in multilobulated complex left pelvicmass,which compresses the pelvic structures and displaces them to theright.Interval development of mild right hydronephrosis and hydroureter,possibly postobstructive.  04/10/18 - CTCAP -     Ct Chest W Contrast  Result Date: 04/10/2018 CLINICAL DATA:  Ovarian cancer, chemotherapy in progress. Fatigue and bloating. EXAM: CT CHEST, ABDOMEN, AND PELVIS WITH CONTRAST TECHNIQUE: Multidetector CT imaging of the chest, abdomen and pelvis was performed following the standard protocol during bolus administration of intravenous contrast.  CONTRAST:  186mL OMNIPAQUE IOHEXOL 300 MG/ML  SOLN COMPARISON:  CT abdomen pelvis 02/23/2018 and 02/19/2013. CT chest 11/19/2010. FINDINGS:  CT CHEST FINDINGS Cardiovascular: Right IJ Port-A-Cath terminates in the low SVC. Atherosclerotic calcification of the arterial vasculature, including coronary arteries and aortic valve. Heart is at the upper limits of normal in size to mildly enlarged. No pericardial effusion. Mediastinum/Nodes: Mediastinal lymph nodes are not enlarged by CT size criteria. No hilar or axillary lymph nodes. Esophagus is grossly unremarkable. Lungs/Pleura: Biapical pleuroparenchymal scarring. 2 mm peripheral lingular nodule (series 7, image 94), unchanged from 11/19/2010 and considered benign. 3 mm left lower lobe nodule (series 7, image 148), likely stable from 02/19/2013, given differences in slice thickness. Lungs are otherwise clear. No pleural fluid. Airway is unremarkable. Musculoskeletal: Degenerative changes in the spine. No worrisome lytic or sclerotic lesions. CT ABDOMEN PELVIS FINDINGS Hepatobiliary: Subcentimeter low-attenuation lesion in the periphery of the right hepatic lobe is too small to characterize. Liver and gallbladder are otherwise unremarkable. No biliary ductal dilatation. Pancreas: Negative. Spleen: Negative. Adrenals/Urinary Tract: Adrenal glands are unremarkable. Subcentimeter low-attenuation lesions in the kidneys are too small to characterize but statistically, cysts are most likely. Ureters may be minimally prominent. Bladder is displaced to the right due to mass effect from adjacent mass. Stomach/Bowel: Small hiatal hernia. Stomach is decompressed. Stomach, small bowel and colon are unremarkable. Appendix is not readily visualized. Vascular/Lymphatic: Atherosclerotic calcification of the arterial vasculature without abdominal aortic aneurysm. No pathologically enlarged lymph nodes. Reproductive: Uterus is visualized. The endometrial cavity may be dilated and  contains fluid. There is a significant amount of streak artifact in this area from a right hip arthroplasty, however. Complex left adnexal mass measures 7.6 x 12.9 cm, previously 8.4 x 15.2 cm. It exerts mass effect on the uterus and bladder. Other: Small scattered ascites, slightly increased. Right paracolic peritoneal nodule measures 1.8 cm (series 2, image 94), previously 2.3 cm. Left paramidline omental nodule measures 1.3 cm (image 74) and is new. Tiny periumbilical hernia contains fluid. Musculoskeletal: Right hip arthroplasty. Degenerative changes in the spine. No worrisome lytic or sclerotic lesions. IMPRESSION: 1. New omental nodule. Otherwise, left ovarian mass and right paracolic gutter nodule have decreased in size in the interval. 2. Small ascites, increased. 3. Endometrial cavity may be dilated and contains fluid. Please correlate clinically. 4. Mild prominence of the ureters bilaterally, possibly due to large left adnexal mass. 5. Aortic atherosclerosis (ICD10-170.0). Coronary artery calcification. Electronically Signed   By: Lorin Picket M.D.   On: 04/10/2018 14:05   Ct Abdomen Pelvis W Contrast  Result Date: 04/10/2018 CLINICAL DATA:  Ovarian cancer, chemotherapy in progress. Fatigue and bloating. EXAM: CT CHEST, ABDOMEN, AND PELVIS WITH CONTRAST TECHNIQUE: Multidetector CT imaging of the chest, abdomen and pelvis was performed following the standard protocol during bolus administration of intravenous contrast. CONTRAST:  13mL OMNIPAQUE IOHEXOL 300 MG/ML  SOLN COMPARISON:  CT abdomen pelvis 02/23/2018 and 02/19/2013. CT chest 11/19/2010. FINDINGS: CT CHEST FINDINGS Cardiovascular: Right IJ Port-A-Cath terminates in the low SVC. Atherosclerotic calcification of the arterial vasculature, including coronary arteries and aortic valve. Heart is at the upper limits of normal in size to mildly enlarged. No pericardial effusion. Mediastinum/Nodes: Mediastinal lymph nodes are not enlarged by CT size  criteria. No hilar or axillary lymph nodes. Esophagus is grossly unremarkable. Lungs/Pleura: Biapical pleuroparenchymal scarring. 2 mm peripheral lingular nodule (series 7, image 94), unchanged from 11/19/2010 and considered benign. 3 mm left lower lobe nodule (series 7, image 148), likely stable from 02/19/2013, given differences in slice thickness. Lungs are otherwise clear. No pleural fluid. Airway is unremarkable. Musculoskeletal: Degenerative changes in the spine. No  worrisome lytic or sclerotic lesions. CT ABDOMEN PELVIS FINDINGS Hepatobiliary: Subcentimeter low-attenuation lesion in the periphery of the right hepatic lobe is too small to characterize. Liver and gallbladder are otherwise unremarkable. No biliary ductal dilatation. Pancreas: Negative. Spleen: Negative. Adrenals/Urinary Tract: Adrenal glands are unremarkable. Subcentimeter low-attenuation lesions in the kidneys are too small to characterize but statistically, cysts are most likely. Ureters may be minimally prominent. Bladder is displaced to the right due to mass effect from adjacent mass. Stomach/Bowel: Small hiatal hernia. Stomach is decompressed. Stomach, small bowel and colon are unremarkable. Appendix is not readily visualized. Vascular/Lymphatic: Atherosclerotic calcification of the arterial vasculature without abdominal aortic aneurysm. No pathologically enlarged lymph nodes. Reproductive: Uterus is visualized. The endometrial cavity may be dilated and contains fluid. There is a significant amount of streak artifact in this area from a right hip arthroplasty, however. Complex left adnexal mass measures 7.6 x 12.9 cm, previously 8.4 x 15.2 cm. It exerts mass effect on the uterus and bladder. Other: Small scattered ascites, slightly increased. Right paracolic peritoneal nodule measures 1.8 cm (series 2, image 94), previously 2.3 cm. Left paramidline omental nodule measures 1.3 cm (image 74) and is new. Tiny periumbilical hernia contains  fluid. Musculoskeletal: Right hip arthroplasty. Degenerative changes in the spine. No worrisome lytic or sclerotic lesions. IMPRESSION: 1. New omental nodule. Otherwise, left ovarian mass and right paracolic gutter nodule have decreased in size in the interval. 2. Small ascites, increased. 3. Endometrial cavity may be dilated and contains fluid. Please correlate clinically. 4. Mild prominence of the ureters bilaterally, possibly due to large left adnexal mass. 5. Aortic atherosclerosis (ICD10-170.0). Coronary artery calcification. Electronically Signed   By: Lorin Picket M.D.   On: 04/10/2018 14:05   .   Outpatient Encounter Medications as of 04/19/2018  Medication Sig  . acetaminophen (TYLENOL) 500 MG tablet Take 500 mg by mouth every 6 (six) hours as needed for mild pain.  Marland Kitchen amLODipine (NORVASC) 5 MG tablet Take 5 mg by mouth daily at 6 PM.  . dexamethasone (DECADRON) 4 MG tablet Take 3 tabs at the night before and 3 tab the morning of chemotherapy, every 3 weeks, by mouth  . levothyroxine (SYNTHROID, LEVOTHROID) 88 MCG tablet Take 88 mcg by mouth daily before breakfast.  . lidocaine-prilocaine (EMLA) cream Apply to affected area once  . loratadine (CLARITIN) 10 MG tablet Take 10 mg by mouth daily as needed for allergies (for congestion).   . LORazepam (ATIVAN) 0.5 MG tablet Take 1 tablet (0.5 mg total) by mouth 2 (two) times daily as needed for anxiety or sedation.  . metoprolol succinate (TOPROL-XL) 50 MG 24 hr tablet Take 50 mg by mouth daily. In the morning  . Multiple Vitamins-Minerals (MULTIVITAMINS THER. W/MINERALS) TABS Take 1 tablet by mouth daily.    Marland Kitchen olmesartan (BENICAR) 40 MG tablet Take 40 mg by mouth daily at 6 PM.   . ondansetron (ZOFRAN ODT) 4 MG disintegrating tablet Take 1 tablet (4 mg total) by mouth every 8 (eight) hours as needed for nausea or vomiting.  . ondansetron (ZOFRAN) 8 MG tablet Take 1 tablet (8 mg total) by mouth every 8 (eight) hours as needed for refractory  nausea / vomiting. Start on day 3 after chemo.  . prochlorperazine (COMPAZINE) 10 MG tablet Take 1 tablet (10 mg total) by mouth every 6 (six) hours as needed (Nausea or vomiting).  . [DISCONTINUED] ibuprofen (ADVIL,MOTRIN) 200 MG tablet Take 200 mg by mouth daily as needed. For pain    No  facility-administered encounter medications on file as of 04/19/2018.    No Known Allergies  Past Medical History:  Diagnosis Date  . Allergy   . Anemia   . Blood transfusion without reported diagnosis   . Cancer ([place])   . Complication of anesthesia    waking up-does not take much medication  . Family history of adverse reaction to anesthesia    problems waking up as does not need much medication  . Hypertension   . Hypothyroidism   . Osteoporosis 10/07   Dexa scan  . PONV (postoperative nausea and vomiting)    Past Surgical History:  Procedure Laterality Date  . APPENDECTOMY  1950   not ruptured  . IR IMAGING GUIDED PORT INSERTION  02/11/2018  . LAPAROSCOPY N/A 01/31/2018   Procedure: LAPAROSCOPY DIAGNOSTIC WITH BIOPSY;  Surgeon: [name], MD;  Location: [facility];  Service: Gynecology;  Laterality: N/A;  . ovarian biopsy    . THYROIDECTOMY    . TONSILLECTOMY    . TOTAL HIP ARTHROPLASTY Right       Past Gynecological History:   GYNECOLOGIC HISTORY:  No LMP recorded. Patient is postmenopausal. . Menarche: 84 years old . P 4 . LMP 84 yo . Contraceptive 2 years OCP . HRT None  . Last Pap 12/2017 negative but no TZone seen due to atrophy Family Hx:  Family History  Problem Relation Age of Onset  . Diabetes Mother   . Stroke Mother   . Breast cancer Sister 19       breast ca  . Diabetes Sister   . Lung cancer Brother   . Stroke Brother    Social Hx:  Marland Kitchen Tobacco use: none . Alcohol use: none . Illicit Drug use: none . Illicit IV Drug use: none    Review of Systems: Review of Systems  Constitutional: Positive for fatigue.  HENT:   Positive for hearing loss.    Gastrointestinal: Positive for abdominal distention.  Psychiatric/Behavioral: The patient is nervous/anxious.   All other systems reviewed and are negative. + early satiety   Vitals: Blood pressure (!) 152/68, pulse 82, temperature (!) 97.5 F (36.4 C), temperature source Oral, resp. rate 20, height 5\' 6"  (1.676 m), weight 166 lb 3.2 oz (75.4 kg), SpO2 97 %. Vitals:   04/19/18 0943  Weight: 162 lb 9.6 oz (73.8 kg)  Height: 5\' 6"  (1.676 m)    Body mass index is 26.24 kg/m.   Physical Exam:   General :  Well developed, 84 y.o., female in no apparent distress HEENT:  Normocephalic/atraumatic, symmetric, EOMI, eyelids normal Neck:   No visible masses.  Respiratory:  Respirations unlabored, no use of accessory muscles CV:   Deferred Breast:  Deferred Musculoskeletal: Normal muscle strength. Abdomen:  No visible masses or protrusion Extremities:  No visible edema or deformities Skin:   Normal inspection Neuro/Psych:  No focal motor deficit, no abnormal mental status. Normal gait. Normal affect. Alert and oriented to person, place, and time  GU :   Deferred today  Genito Urinary: From 04/12/18 exam Vulva: Normal external female genitalia.  Bladder/urethra: Urethral meatus normal in size and location. No lesions or   masses, well supported bladder Bimanual exam: No lesions or masses Rectovaginal:  No further nodular disease palpated on exam. Good tone, no masses, no cul de sac nodularity, no parametrial involvement or nodularity.  from 01/10/18 exam Bimanual exam: palpable disease in culdesac, nodular, no breakthrough to vagina.  Uterus: Normal size, mobile.  Adnexa: palpable disease  in culdesac deviating to patient's left.  Rectovaginal:  + disease palpable but no breakthrough into rectal mucsosa. Cannot separate from anterior rectal wall. Starting about 6-7cm from anal verge. Good tone, no parametrial involvement   Assessment  Pelvic mass with paraaortic adenopathy and  omental disease  Plan  1. Response o We confirmed at Tumor Board she looks to be responding and the omental nodule was present back in July 2019. 2. Surgery consideration o Surgery is reasonable at this time. There is still risk of suboptimal debulking and bowel resection. o The left paraaortic lymph node is high just under the left renal vessels. It is now non-pathologic size and so I won't be planning to resect.  o The surgical sketch was reviewed along with the risks, benefits, and alternatives. She was given a copy of this at the end of the visit. i. We discussed the possibility of inability to remove all disease (specifically the high left PALN), bowel resection, ostomy, ICU stay.  3. Treatment plan o We reviewed the need for chemo following surgery and she confirms she plan to complete out the 6 cycles as previously discussed.   Face to face time with patient was ~30 minutes. Over 50% of this time was spent on counseling and coordination of care.    [name], MD  04/19/2018, 10:49 AM   Cc: Marton Redwood, MD (Referring PCP) [name], MD (Medical Oncology)

## 2018-04-19 ENCOUNTER — Inpatient Hospital Stay: Payer: Medicare Other | Attending: Obstetrics | Admitting: Obstetrics

## 2018-04-19 ENCOUNTER — Ambulatory Visit: Payer: Medicare Other | Admitting: Hematology and Oncology

## 2018-04-19 ENCOUNTER — Ambulatory Visit: Payer: Medicare Other

## 2018-04-19 ENCOUNTER — Other Ambulatory Visit: Payer: Medicare Other

## 2018-04-19 ENCOUNTER — Encounter: Payer: Self-pay | Admitting: Obstetrics

## 2018-04-19 VITALS — BP 147/89 | HR 91 | Temp 97.5°F | Resp 20 | Ht 66.0 in | Wt 162.6 lb

## 2018-04-19 DIAGNOSIS — C786 Secondary malignant neoplasm of retroperitoneum and peritoneum: Secondary | ICD-10-CM | POA: Diagnosis not present

## 2018-04-19 DIAGNOSIS — Z90722 Acquired absence of ovaries, bilateral: Secondary | ICD-10-CM | POA: Insufficient documentation

## 2018-04-19 DIAGNOSIS — Z9221 Personal history of antineoplastic chemotherapy: Secondary | ICD-10-CM

## 2018-04-19 DIAGNOSIS — C569 Malignant neoplasm of unspecified ovary: Secondary | ICD-10-CM

## 2018-04-19 NOTE — Patient Instructions (Signed)
Preparing for your Surgery  Plan for surgery on April 25, 2018 with Dr. Precious Haws at [place] will be scheduled for an exploratory laparotomy, total abdominal hysterectomy, bilateral salpingo-oophorectomy, omentectomy, tumor debulking, possible bowel resection.  Pre-operative Testing -You will receive a phone call from presurgical testing at [facility] to arrange for a pre-operative testing appointment before your surgery.  This appointment normally occurs one to two weeks before your scheduled surgery.   -Bring your insurance card, copy of an advanced directive if applicable, medication list  -At that visit, you will be asked to sign a consent for a possible blood transfusion in case a transfusion becomes necessary during surgery.  The need for a blood transfusion is rare but having consent is a necessary part of your care.     -You should not be taking blood thinners or aspirin at least ten days prior to surgery unless instructed by your surgeon.  Day Before Surgery at [facility] will be asked to take in a light diet the day before surgery.  Avoid carbonated beverages.  You will be advised to have nothing to eat or drink after midnight the evening before.    Eat a light diet the day before surgery.  Examples including soups, broths, toast, yogurt, mashed potatoes.  Things to avoid include carbonated beverages (fizzy beverages), raw fruits and raw vegetables, or beans.   If your bowels are filled with gas, your surgeon will have difficulty visualizing your pelvic organs which increases your surgical risks.  Starting at 4pm the day before surgery, begin drinking one bottle of magnesium citrate and only take in clear liquids after that time.  Your role in recovery Your role is to become active as soon as directed by your doctor, while still giving yourself time to heal.  Rest when you feel tired. You will be asked to do the following in order to  speed your recovery:  - Cough and breathe deeply. This helps toclear and expand your lungs and can prevent pneumonia. You may be given a spirometer to practice deep breathing. A staff member will show you how to use the spirometer. - Do mild physical activity. Walking or moving your legs help your circulation and body functions return to normal. A staff member will help you when you try to walk and will provide you with simple exercises. Do not try to get up or walk alone the first time. - Actively manage your pain. Managing your pain lets you move in comfort. We will ask you to rate your pain on a scale of zero to 10. It is your responsibility to tell your doctor or nurse where and how much you hurt so your pain can be treated.  Special Considerations -If you are diabetic, you may be placed on insulin after surgery to have closer control over your blood sugars to promote healing and recovery.  This does not mean that you will be discharged on insulin.  If applicable, your oral antidiabetics will be resumed when you are tolerating a solid diet.  -Your final pathology results from surgery should be available by the Friday after surgery and the results will be relayed to you when available.  -Dr. Lahoma Crocker is the Surgeon that assists your GYN Oncologist with surgery.  The next day after your surgery you will either see your GYN Oncologist or Dr. Lahoma Crocker.   Blood Transfusion Information WHAT IS A BLOOD TRANSFUSION? A transfusion is the replacement of blood  or some of its parts. Blood is made up of multiple cells which provide different functions.  Red blood cells carry oxygen and are used for blood loss replacement.  White blood cells fight against infection.  Platelets control bleeding.  Plasma helps clot blood.  Other blood products are available for specialized needs, such as hemophilia or other clotting disorders. BEFORE THE TRANSFUSION  Who gives blood for  transfusions?   You may be able to donate blood to be used at a later date on yourself (autologous donation).  Relatives can be asked to donate blood. This is generally not any safer than if you have received blood from a stranger. The same precautions are taken to ensure safety when a relative's blood is donated.  Healthy volunteers who are fully evaluated to make sure their blood is safe. This is blood bank blood. Transfusion therapy is the safest it has ever been in the practice of medicine. Before blood is taken from a donor, a complete history is taken to make sure that person has no history of diseases nor engages in risky social behavior (examples are intravenous drug use or sexual activity with multiple partners). The donor's travel history is screened to minimize risk of transmitting infections, such as malaria. The donated blood is tested for signs of infectious diseases, such as HIV and hepatitis. The blood is then tested to be sure it is compatible with you in order to minimize the chance of a transfusion reaction. If you or a relative donates blood, this is often done in anticipation of surgery and is not appropriate for emergency situations. It takes many days to process the donated blood. RISKS AND COMPLICATIONS Although transfusion therapy is very safe and saves many lives, the main dangers of transfusion include:   Getting an infectious disease.  Developing a transfusion reaction. This is an allergic reaction to something in the blood you were given. Every precaution is taken to prevent this. The decision to have a blood transfusion has been considered carefully by your caregiver before blood is given. Blood is not given unless the benefits outweigh the risks.

## 2018-04-23 NOTE — Progress Notes (Signed)
03-29-18 (Epic) CBC w/Diff, CMP

## 2018-04-23 NOTE — Patient Instructions (Addendum)
Shannon Ellis  04/23/2018   Your procedure is scheduled on: 04-25-18     Report to [facility] Main  Entrance    Report to Admitting at 10:00 AM    Call this number if you have problems the morning of surgery [phone]     Remember: Do not eat food or drink liquids :After Midnight.    BRUSH YOUR TEETH MORNING OF SURGERY AND RINSE YOUR MOUTH OUT, NO CHEWING GUM CANDY OR MINTS.    Eat a light diet the day before surgery.  Examples including soups, broths, toast, yogurt, mashed potatoes.  Things to avoid include carbonated beverages (fizzy beverages), raw fruits and raw vegetables, or beans.    If your bowels are filled with gas, your surgeon will have difficulty visualizing your pelvic organs which increases your surgical risks.   Take these medicines the morning of surgery with A SIP OF WATER: Levothyroxine (Synthroid), Metoprolol Succinate, Claritin as needed. You may also bring and use your eyedrops                               You may not have any metal on your body including hair pins and              piercings  Do not wear jewelry, make-up, lotions, powders or perfumes, deodorant             Do not wear nail polish.  Do not shave  48 hours prior to surgery.             Do not bring valuables to the hospital. [place].  Contacts, dentures or bridgework may not be worn into surgery.  Leave suitcase in the car. After surgery it may be brought to your room.       Special Instructions: Cough and Deep Breath Exercises              Please read over the following fact sheets you were given: _____________________________________________________________________             [facility] - Preparing for Surgery Before surgery, you can play an important role.  Because skin is not sterile, your skin needs to be as free of germs as possible.  You can reduce the number of germs on your skin by washing with  CHG (chlorahexidine gluconate) soap before surgery.  CHG is an antiseptic cleaner which kills germs and bonds with the skin to continue killing germs even after washing. Please DO NOT use if you have an allergy to CHG or antibacterial soaps.  If your skin becomes reddened/irritated stop using the CHG and inform your nurse when you arrive at Short Stay. Do not shave (including legs and underarms) for at least 48 hours prior to the first CHG shower.  You may shave your face/neck. Please follow these instructions carefully:  1.  Shower with CHG Soap the night before surgery and the  morning of Surgery.  2.  If you choose to wash your hair, wash your hair first as usual with your  normal  shampoo.  3.  After you shampoo, rinse your hair and body thoroughly to remove the  shampoo.  4.  Use CHG as you would any other liquid soap.  You can apply chg directly  to the skin and wash                       Gently with a scrungie or clean washcloth.  5.  Apply the CHG Soap to your body ONLY FROM THE NECK DOWN.   Do not use on face/ open                           Wound or open sores. Avoid contact with eyes, ears mouth and genitals (private parts).                       Wash face,  Genitals (private parts) with your normal soap.             6.  Wash thoroughly, paying special attention to the area where your surgery  will be performed.  7.  Thoroughly rinse your body with warm water from the neck down.  8.  DO NOT shower/wash with your normal soap after using and rinsing off  the CHG Soap.                9.  Pat yourself dry with a clean towel.            10.  Wear clean pajamas.            11.  Place clean sheets on your bed the night of your first shower and do not  sleep with pets. Day of Surgery : Do not apply any lotions/deodorants the morning of surgery.  Please wear clean clothes to the hospital/surgery center.  FAILURE TO FOLLOW THESE INSTRUCTIONS MAY RESULT IN THE CANCELLATION  OF YOUR SURGERY PATIENT SIGNATURE_________________________________  NURSE SIGNATURE__________________________________  ________________________________________________________________________   Adam Phenix  An incentive spirometer is a tool that can help keep your lungs clear and active. This tool measures how well you are filling your lungs with each breath. Taking long deep breaths may help reverse or decrease the chance of developing breathing (pulmonary) problems (especially infection) following:  A long period of time when you are unable to move or be active. BEFORE THE PROCEDURE   If the spirometer includes an indicator to show your best effort, your nurse or respiratory therapist will set it to a desired goal.  If possible, sit up straight or lean slightly forward. Try not to slouch.  Hold the incentive spirometer in an upright position. INSTRUCTIONS FOR USE  1. Sit on the edge of your bed if possible, or sit up as far as you can in bed or on a chair. 2. Hold the incentive spirometer in an upright position. 3. Breathe out normally. 4. Place the mouthpiece in your mouth and seal your lips tightly around it. 5. Breathe in slowly and as deeply as possible, raising the piston or the ball toward the top of the column. 6. Hold your breath for 3-5 seconds or for as long as possible. Allow the piston or ball to fall to the bottom of the column. 7. Remove the mouthpiece from your mouth and breathe out normally. 8. Rest for a few seconds and repeat Steps 1 through 7 at least 10 times every 1-2 hours when you are awake. Take your time and take a few normal breaths between deep breaths. 9. The spirometer may include an indicator to show  your best effort. Use the indicator as a goal to work toward during each repetition. 10. After each set of 10 deep breaths, practice coughing to be sure your lungs are clear. If you have an incision (the cut made at the time of surgery), support your  incision when coughing by placing a pillow or rolled up towels firmly against it. Once you are able to get out of bed, walk around indoors and cough well. You may stop using the incentive spirometer when instructed by your caregiver.  RISKS AND COMPLICATIONS  Take your time so you do not get dizzy or light-headed.  If you are in pain, you may need to take or ask for pain medication before doing incentive spirometry. It is harder to take a deep breath if you are having pain. AFTER USE  Rest and breathe slowly and easily.  It can be helpful to keep track of a log of your progress. Your caregiver can provide you with a simple table to help with this. If you are using the spirometer at home, follow these instructions: [place] IF:   You are having difficultly using the spirometer.  You have trouble using the spirometer as often as instructed.  Your pain medication is not giving enough relief while using the spirometer.  You develop fever of 100.5 F (38.1 C) or higher. SEEK IMMEDIATE MEDICAL CARE IF:   You cough up bloody sputum that had not been present before.  You develop fever of 102 F (38.9 C) or greater.  You develop worsening pain at or near the incision site. MAKE SURE YOU:   Understand these instructions.  Will watch your condition.  Will get help right away if you are not doing well or get worse. Document Released: 10/16/2006 Document Revised: 08/28/2011 Document Reviewed: 12/17/2006 Exit[employer] Patient Information 2014 Exit[employer], [place].   ________________________________________________________________________  WHAT IS A BLOOD TRANSFUSION? Blood Transfusion Information  A transfusion is the replacement of blood or some of its parts. Blood is made up of multiple cells which provide different functions.  Red blood cells carry oxygen and are used for blood loss replacement.  White blood cells fight against infection.  Platelets control bleeding.  Plasma  helps clot blood.  Other blood products are available for specialized needs, such as hemophilia or other clotting disorders. BEFORE THE TRANSFUSION  Who gives blood for transfusions?   Healthy volunteers who are fully evaluated to make sure their blood is safe. This is blood bank blood. Transfusion therapy is the safest it has ever been in the practice of medicine. Before blood is taken from a donor, a complete history is taken to make sure that person has no history of diseases nor engages in risky social behavior (examples are intravenous drug use or sexual activity with multiple partners). The donor's travel history is screened to minimize risk of transmitting infections, such as malaria. The donated blood is tested for signs of infectious diseases, such as HIV and hepatitis. The blood is then tested to be sure it is compatible with you in order to minimize the chance of a transfusion reaction. If you or a relative donates blood, this is often done in anticipation of surgery and is not appropriate for emergency situations. It takes many days to process the donated blood. RISKS AND COMPLICATIONS Although transfusion therapy is very safe and saves many lives, the main dangers of transfusion include:   Getting an infectious disease.  Developing a transfusion reaction. This is an allergic reaction to  something in the blood you were given. Every precaution is taken to prevent this. The decision to have a blood transfusion has been considered carefully by your caregiver before blood is given. Blood is not given unless the benefits outweigh the risks. AFTER THE TRANSFUSION  Right after receiving a blood transfusion, you will usually feel much better and more energetic. This is especially true if your red blood cells have gotten low (anemic). The transfusion raises the level of the red blood cells which carry oxygen, and this usually causes an energy increase.  The nurse administering the transfusion  will monitor you carefully for complications. HOME CARE INSTRUCTIONS  No special instructions are needed after a transfusion. You may find your energy is better. Speak with your caregiver about any limitations on activity for underlying diseases you may have. SEEK MEDICAL CARE IF:   Your condition is not improving after your transfusion.  You develop redness or irritation at the intravenous (IV) site. SEEK IMMEDIATE MEDICAL CARE IF:  Any of the following symptoms occur over the next 12 hours:  Shaking chills.  You have a temperature by mouth above 102 F (38.9 C), not controlled by medicine.  Chest, back, or muscle pain.  People around you feel you are not acting correctly or are confused.  Shortness of breath or difficulty breathing.  Dizziness and fainting.  You get a rash or develop hives.  You have a decrease in urine output.  Your urine turns a dark color or changes to pink, red, or brown. Any of the following symptoms occur over the next 10 days:  You have a temperature by mouth above 102 F (38.9 C), not controlled by medicine.  Shortness of breath.  Weakness after normal activity.  The white part of the eye turns yellow (jaundice).  You have a decrease in the amount of urine or are urinating less often.  Your urine turns a dark color or changes to pink, red, or brown. Document Released: 06/02/2000 Document Revised: 08/28/2011 Document Reviewed: 01/20/2008 [facility] Patient Information 2014 [place], [place].  _______________________________________________________________________

## 2018-04-24 ENCOUNTER — Encounter (HOSPITAL_COMMUNITY): Payer: Self-pay

## 2018-04-24 ENCOUNTER — Other Ambulatory Visit: Payer: Self-pay

## 2018-04-24 ENCOUNTER — Encounter (HOSPITAL_COMMUNITY)
Admission: RE | Admit: 2018-04-24 | Discharge: 2018-04-24 | Disposition: A | Discharge status: Home / Self Care | Payer: Medicare Other | Source: Ambulatory Visit | Attending: Obstetrics | Admitting: Obstetrics

## 2018-04-24 DIAGNOSIS — C569 Malignant neoplasm of unspecified ovary: Secondary | ICD-10-CM

## 2018-04-24 DIAGNOSIS — Z01812 Encounter for preprocedural laboratory examination: Secondary | ICD-10-CM

## 2018-04-24 LAB — CBC WITH DIFFERENTIAL/PLATELET
ABS IMMATURE GRANULOCYTES: 0.08 10*3/uL — AB (ref 0.00–0.07)
BASOS ABS: 0.1 10*3/uL (ref 0.0–0.1)
BASOS PCT: 1 %
Eosinophils Absolute: 0.2 10*3/uL (ref 0.0–0.5)
Eosinophils Relative: 2 %
HCT: 39 % (ref 36.0–46.0)
HEMOGLOBIN: 12.3 g/dL (ref 12.0–15.0)
IMMATURE GRANULOCYTES: 1 %
LYMPHS PCT: 19 %
Lymphs Abs: 1.5 10*3/uL (ref 0.7–4.0)
MCH: 28.2 pg (ref 26.0–34.0)
MCHC: 31.5 g/dL (ref 30.0–36.0)
MCV: 89.4 fL (ref 80.0–100.0)
MONO ABS: 0.7 10*3/uL (ref 0.1–1.0)
Monocytes Relative: 9 %
NEUTROS ABS: 5.3 10*3/uL (ref 1.7–7.7)
NEUTROS PCT: 68 %
PLATELETS: 288 10*3/uL (ref 150–400)
RBC: 4.36 MIL/uL (ref 3.87–5.11)
RDW: 15.9 % — ABNORMAL HIGH (ref 11.5–15.5)
WBC: 7.8 10*3/uL (ref 4.0–10.5)
nRBC: 0 % (ref 0.0–0.2)

## 2018-04-24 LAB — COMPREHENSIVE METABOLIC PANEL
ALBUMIN: 4.2 g/dL (ref 3.5–5.0)
ALK PHOS: 160 U/L — AB (ref 38–126)
ALT: 20 U/L (ref 0–44)
AST: 34 U/L (ref 15–41)
Anion gap: 7 (ref 5–15)
BUN: 15 mg/dL (ref 8–23)
CHLORIDE: 103 mmol/L (ref 98–111)
CO2: 27 mmol/L (ref 22–32)
Calcium: 9.3 mg/dL (ref 8.9–10.3)
Creatinine, Ser: 0.88 mg/dL (ref 0.44–1.00)
GFR calc non Af Amer: 59 mL/min — ABNORMAL LOW (ref >60)
GLUCOSE: 108 mg/dL — AB (ref 70–99)
Potassium: 4.4 mmol/L (ref 3.5–5.1)
SODIUM: 137 mmol/L (ref 135–145)
Total Bilirubin: 0.6 mg/dL (ref 0.3–1.2)
Total Protein: 7.5 g/dL (ref 6.5–8.1)

## 2018-04-24 LAB — URINALYSIS, ROUTINE W REFLEX MICROSCOPIC
Bilirubin Urine: NEGATIVE
GLUCOSE, UA: NEGATIVE mg/dL
HGB URINE DIPSTICK: NEGATIVE
Ketones, ur: 5 mg/dL — AB
NITRITE: NEGATIVE
Protein, ur: NEGATIVE mg/dL
SPECIFIC GRAVITY, URINE: 1.019 (ref 1.005–1.030)
pH: 6 (ref 5.0–8.0)

## 2018-04-24 MED ORDER — SODIUM CHLORIDE 0.9 % IV SOLN
2.0000 g | INTRAVENOUS | Status: AC
  Administered 2018-04-25: 2 g via INTRAVENOUS
  Filled 2018-04-24: qty 2

## 2018-04-24 NOTE — Progress Notes (Signed)
04-10-18 (Epic) CT chest w/contrast  01-30-18 (Epic) EKG

## 2018-04-24 NOTE — Progress Notes (Addendum)
Pt to have a Light Diet the day prior to surgery. Pt advised nurse that she had a slice of bacon, and a corn muffin prior to PAT appt. Nurse discussed what a Light diet consists of.Marland KitchenMarland KitchenSharyn Lull, of Dr. Pete Pelt office made aware. Sharyn Lull spoke with Joylene John, PA. No new orders at this time.   04-24-18 UA result routed to Dr. Gerarda Fraction for review.

## 2018-04-25 ENCOUNTER — Encounter (HOSPITAL_COMMUNITY): Payer: Self-pay | Admitting: Certified Registered Nurse Anesthetist

## 2018-04-25 ENCOUNTER — Inpatient Hospital Stay (HOSPITAL_COMMUNITY): Payer: Medicare Other | Admitting: Certified Registered Nurse Anesthetist

## 2018-04-25 ENCOUNTER — Encounter (HOSPITAL_COMMUNITY)
Admission: RE | Disposition: A | Discharge status: Home / Self Care | Payer: Self-pay | Source: Ambulatory Visit | Attending: Obstetrics

## 2018-04-25 ENCOUNTER — Inpatient Hospital Stay (HOSPITAL_COMMUNITY)
Admission: RE | Admit: 2018-04-25 | Discharge: 2018-04-30 | DRG: 737 | Disposition: A | Discharge status: Home / Self Care | Payer: Medicare Other | Source: Ambulatory Visit | Attending: Obstetrics | Admitting: Obstetrics

## 2018-04-25 DIAGNOSIS — I10 Essential (primary) hypertension: Secondary | ICD-10-CM | POA: Diagnosis not present

## 2018-04-25 DIAGNOSIS — R000 Tachycardia, unspecified: Secondary | ICD-10-CM | POA: Diagnosis not present

## 2018-04-25 DIAGNOSIS — C569 Malignant neoplasm of unspecified ovary: Principal | ICD-10-CM | POA: Diagnosis present

## 2018-04-25 DIAGNOSIS — D649 Anemia, unspecified: Secondary | ICD-10-CM | POA: Diagnosis not present

## 2018-04-25 DIAGNOSIS — F419 Anxiety disorder, unspecified: Secondary | ICD-10-CM | POA: Diagnosis present

## 2018-04-25 DIAGNOSIS — G629 Polyneuropathy, unspecified: Secondary | ICD-10-CM | POA: Diagnosis present

## 2018-04-25 DIAGNOSIS — K9189 Other postprocedural complications and disorders of digestive system: Secondary | ICD-10-CM | POA: Diagnosis not present

## 2018-04-25 DIAGNOSIS — E890 Postprocedural hypothyroidism: Secondary | ICD-10-CM | POA: Diagnosis present

## 2018-04-25 DIAGNOSIS — C7989 Secondary malignant neoplasm of other specified sites: Secondary | ICD-10-CM | POA: Diagnosis not present

## 2018-04-25 DIAGNOSIS — D62 Acute posthemorrhagic anemia: Secondary | ICD-10-CM

## 2018-04-25 DIAGNOSIS — E871 Hypo-osmolality and hyponatremia: Secondary | ICD-10-CM | POA: Diagnosis not present

## 2018-04-25 DIAGNOSIS — Z889 Allergy status to unspecified drugs, medicaments and biological substances status: Secondary | ICD-10-CM

## 2018-04-25 DIAGNOSIS — Z9221 Personal history of antineoplastic chemotherapy: Secondary | ICD-10-CM | POA: Diagnosis not present

## 2018-04-25 DIAGNOSIS — R34 Anuria and oliguria: Secondary | ICD-10-CM

## 2018-04-25 DIAGNOSIS — D259 Leiomyoma of uterus, unspecified: Secondary | ICD-10-CM | POA: Diagnosis present

## 2018-04-25 DIAGNOSIS — C7982 Secondary malignant neoplasm of genital organs: Secondary | ICD-10-CM | POA: Diagnosis not present

## 2018-04-25 DIAGNOSIS — C562 Malignant neoplasm of left ovary: Secondary | ICD-10-CM | POA: Diagnosis not present

## 2018-04-25 DIAGNOSIS — J45909 Unspecified asthma, uncomplicated: Secondary | ICD-10-CM

## 2018-04-25 DIAGNOSIS — E869 Volume depletion, unspecified: Secondary | ICD-10-CM | POA: Diagnosis present

## 2018-04-25 DIAGNOSIS — Z4682 Encounter for fitting and adjustment of non-vascular catheter: Secondary | ICD-10-CM | POA: Diagnosis not present

## 2018-04-25 DIAGNOSIS — Z79899 Other long term (current) drug therapy: Secondary | ICD-10-CM

## 2018-04-25 DIAGNOSIS — K567 Ileus, unspecified: Secondary | ICD-10-CM | POA: Diagnosis not present

## 2018-04-25 DIAGNOSIS — C786 Secondary malignant neoplasm of retroperitoneum and peritoneum: Secondary | ICD-10-CM | POA: Diagnosis not present

## 2018-04-25 DIAGNOSIS — Z0189 Encounter for other specified special examinations: Secondary | ICD-10-CM

## 2018-04-25 DIAGNOSIS — R1110 Vomiting, unspecified: Secondary | ICD-10-CM

## 2018-04-25 DIAGNOSIS — Z96641 Presence of right artificial hip joint: Secondary | ICD-10-CM | POA: Diagnosis present

## 2018-04-25 HISTORY — PX: HYSTERECTOMY ABDOMINAL WITH SALPINGO-OOPHORECTOMY: SHX6792

## 2018-04-25 HISTORY — PX: OMENTECTOMY: SHX5985

## 2018-04-25 HISTORY — PX: DEBULKING: SHX6277

## 2018-04-25 LAB — URINE CULTURE: CULTURE: NO GROWTH

## 2018-04-25 SURGERY — HYSTERECTOMY, ABDOMINAL, WITH SALPINGO-OOPHORECTOMY
Anesthesia: General

## 2018-04-25 MED ORDER — BUPIVACAINE LIPOSOME 1.3 % IJ SUSP
20.0000 mL | Freq: Once | INTRAMUSCULAR | Status: AC
  Administered 2018-04-25: 20 mL
  Filled 2018-04-25: qty 20

## 2018-04-25 MED ORDER — PREGABALIN 50 MG PO CAPS
50.0000 mg | ORAL_CAPSULE | Freq: Two times a day (BID) | ORAL | Status: DC
  Administered 2018-04-26 – 2018-04-30 (×9): 50 mg via ORAL
  Filled 2018-04-25 (×9): qty 1

## 2018-04-25 MED ORDER — SODIUM CHLORIDE (PF) 0.9 % IJ SOLN
INTRAMUSCULAR | Status: AC
  Filled 2018-04-25: qty 50

## 2018-04-25 MED ORDER — CHEWING GUM (ORBIT) SUGAR FREE
1.0000 | CHEWING_GUM | Freq: Three times a day (TID) | ORAL | Status: DC
  Administered 2018-04-25 – 2018-04-30 (×13): 1 via ORAL
  Filled 2018-04-25: qty 1

## 2018-04-25 MED ORDER — AMLODIPINE BESYLATE 5 MG PO TABS
5.0000 mg | ORAL_TABLET | Freq: Every day | ORAL | Status: DC
  Administered 2018-04-25 – 2018-04-29 (×5): 5 mg via ORAL
  Filled 2018-04-25 (×5): qty 1

## 2018-04-25 MED ORDER — SCOPOLAMINE 1 MG/3DAYS TD PT72
1.0000 | MEDICATED_PATCH | Freq: Once | TRANSDERMAL | Status: DC
  Administered 2018-04-25: 1.5 mg via TRANSDERMAL

## 2018-04-25 MED ORDER — BUPIVACAINE HCL (PF) 0.25 % IJ SOLN
INTRAMUSCULAR | Status: DC | PRN
  Administered 2018-04-25: 20 mL

## 2018-04-25 MED ORDER — ONDANSETRON HCL 4 MG PO TABS: 4.0000 mg | ORAL_TABLET | Freq: Four times a day (QID) | ORAL | Status: DC | PRN

## 2018-04-25 MED ORDER — ACETAMINOPHEN 500 MG PO TABS
1000.0000 mg | ORAL_TABLET | ORAL | Status: AC
  Administered 2018-04-25: 1000 mg via ORAL
  Filled 2018-04-25: qty 2

## 2018-04-25 MED ORDER — SUGAMMADEX SODIUM 500 MG/5ML IV SOLN
INTRAVENOUS | Status: DC | PRN
  Administered 2018-04-25: 200 mg via INTRAVENOUS

## 2018-04-25 MED ORDER — PROPOFOL 10 MG/ML IV BOLUS
INTRAVENOUS | Status: DC | PRN
  Administered 2018-04-25: 130 mg via INTRAVENOUS

## 2018-04-25 MED ORDER — ALBUMIN HUMAN 5 % IV SOLN
INTRAVENOUS | Status: DC | PRN
  Administered 2018-04-25 (×2): via INTRAVENOUS

## 2018-04-25 MED ORDER — PROMETHAZINE HCL 25 MG/ML IJ SOLN: 6.2500 mg | INTRAMUSCULAR | Status: DC | PRN

## 2018-04-25 MED ORDER — TRAMADOL HCL 50 MG PO TABS: 100.0000 mg | ORAL_TABLET | Freq: Two times a day (BID) | ORAL | Status: DC | PRN

## 2018-04-25 MED ORDER — ROCURONIUM BROMIDE 100 MG/10ML IV SOLN
INTRAVENOUS | Status: DC | PRN
  Administered 2018-04-25: 10 mg via INTRAVENOUS
  Administered 2018-04-25: 50 mg via INTRAVENOUS
  Administered 2018-04-25: 20 mg via INTRAVENOUS

## 2018-04-25 MED ORDER — LEVOTHYROXINE SODIUM 88 MCG PO TABS
88.0000 ug | ORAL_TABLET | Freq: Every day | ORAL | Status: DC
  Administered 2018-04-26 – 2018-04-30 (×5): 88 ug via ORAL
  Filled 2018-04-25 (×5): qty 1

## 2018-04-25 MED ORDER — OXYCODONE HCL 5 MG PO TABS
5.0000 mg | ORAL_TABLET | ORAL | Status: DC | PRN
  Administered 2018-04-25 – 2018-04-26 (×2): 5 mg via ORAL
  Filled 2018-04-25 (×3): qty 1

## 2018-04-25 MED ORDER — SODIUM CHLORIDE (PF) 0.9 % IJ SOLN
INTRAMUSCULAR | Status: DC | PRN
  Administered 2018-04-25: 20 mL

## 2018-04-25 MED ORDER — 0.9 % SODIUM CHLORIDE (POUR BTL) OPTIME
TOPICAL | Status: DC | PRN
  Administered 2018-04-25: 1000 mL

## 2018-04-25 MED ORDER — PROPOFOL 500 MG/50ML IV EMUL
INTRAVENOUS | Status: DC | PRN
  Administered 2018-04-25: 100 ug/kg/min via INTRAVENOUS

## 2018-04-25 MED ORDER — LIDOCAINE HCL (CARDIAC) PF 100 MG/5ML IV SOSY
PREFILLED_SYRINGE | INTRAVENOUS | Status: DC | PRN
  Administered 2018-04-25: 50 mg via INTRAVENOUS

## 2018-04-25 MED ORDER — ENSURE ENLIVE PO LIQD
237.0000 mL | Freq: Two times a day (BID) | ORAL | Status: DC
  Administered 2018-04-25 – 2018-04-26 (×3): 237 mL via ORAL

## 2018-04-25 MED ORDER — DEXAMETHASONE SODIUM PHOSPHATE 4 MG/ML IJ SOLN: 4.0000 mg | INTRAMUSCULAR | Status: DC

## 2018-04-25 MED ORDER — DOCUSATE SODIUM 100 MG PO CAPS
100.0000 mg | ORAL_CAPSULE | Freq: Two times a day (BID) | ORAL | Status: DC
  Administered 2018-04-25 – 2018-04-26 (×3): 100 mg via ORAL
  Filled 2018-04-25 (×3): qty 1

## 2018-04-25 MED ORDER — GABAPENTIN 300 MG PO CAPS
300.0000 mg | ORAL_CAPSULE | ORAL | Status: AC
  Administered 2018-04-25: 300 mg via ORAL
  Filled 2018-04-25: qty 1

## 2018-04-25 MED ORDER — PROPOFOL 10 MG/ML IV BOLUS
INTRAVENOUS | Status: AC
  Filled 2018-04-25: qty 60

## 2018-04-25 MED ORDER — LACTATED RINGERS IV SOLN
INTRAVENOUS | Status: DC | PRN
  Administered 2018-04-25 (×3): via INTRAVENOUS

## 2018-04-25 MED ORDER — IRBESARTAN 300 MG PO TABS
300.0000 mg | ORAL_TABLET | Freq: Every day | ORAL | Status: DC
  Administered 2018-04-25 – 2018-04-30 (×6): 300 mg via ORAL
  Filled 2018-04-25 (×6): qty 1

## 2018-04-25 MED ORDER — STERILE WATER FOR IRRIGATION IR SOLN
Status: DC | PRN
  Administered 2018-04-25: 2000 mL

## 2018-04-25 MED ORDER — FENTANYL CITRATE (PF) 100 MCG/2ML IJ SOLN
INTRAMUSCULAR | Status: DC | PRN
  Administered 2018-04-25 (×3): 50 ug via INTRAVENOUS
  Administered 2018-04-25: 25 ug via INTRAVENOUS
  Administered 2018-04-25: 50 ug via INTRAVENOUS
  Administered 2018-04-25: 25 ug via INTRAVENOUS

## 2018-04-25 MED ORDER — FENTANYL CITRATE (PF) 100 MCG/2ML IJ SOLN
25.0000 ug | INTRAMUSCULAR | Status: DC | PRN
  Administered 2018-04-25: 25 ug via INTRAVENOUS

## 2018-04-25 MED ORDER — ONDANSETRON HCL 4 MG/2ML IJ SOLN
INTRAMUSCULAR | Status: DC | PRN
  Administered 2018-04-25: 4 mg via INTRAVENOUS

## 2018-04-25 MED ORDER — ENOXAPARIN SODIUM 40 MG/0.4ML ~~LOC~~ SOLN
40.0000 mg | SUBCUTANEOUS | Status: DC
  Administered 2018-04-26 – 2018-04-30 (×5): 40 mg via SUBCUTANEOUS
  Filled 2018-04-25 (×5): qty 0.4

## 2018-04-25 MED ORDER — NON FORMULARY: 1.0000 [IU] | Freq: Three times a day (TID) | Status: DC

## 2018-04-25 MED ORDER — FENTANYL CITRATE (PF) 100 MCG/2ML IJ SOLN
INTRAMUSCULAR | Status: AC
  Filled 2018-04-25: qty 2

## 2018-04-25 MED ORDER — MIDAZOLAM HCL 2 MG/2ML IJ SOLN
INTRAMUSCULAR | Status: AC
  Filled 2018-04-25: qty 2

## 2018-04-25 MED ORDER — HEMOSTATIC AGENTS (NO CHARGE) OPTIME
TOPICAL | Status: DC | PRN
  Administered 2018-04-25: 1 via TOPICAL

## 2018-04-25 MED ORDER — PHENYLEPHRINE HCL 10 MG/ML IJ SOLN
INTRAMUSCULAR | Status: DC | PRN
  Administered 2018-04-25: 40 ug via INTRAVENOUS
  Administered 2018-04-25: 20 ug via INTRAVENOUS
  Administered 2018-04-25: 40 ug via INTRAVENOUS

## 2018-04-25 MED ORDER — ACETAMINOPHEN 500 MG PO TABS
1000.0000 mg | ORAL_TABLET | Freq: Two times a day (BID) | ORAL | Status: DC
  Administered 2018-04-25: 1000 mg via ORAL
  Filled 2018-04-25: qty 2

## 2018-04-25 MED ORDER — FENTANYL CITRATE (PF) 250 MCG/5ML IJ SOLN
INTRAMUSCULAR | Status: AC
  Filled 2018-04-25: qty 5

## 2018-04-25 MED ORDER — SCOPOLAMINE 1 MG/3DAYS TD PT72
MEDICATED_PATCH | TRANSDERMAL | Status: AC
  Filled 2018-04-25: qty 1

## 2018-04-25 MED ORDER — MIDAZOLAM HCL 5 MG/5ML IJ SOLN
INTRAMUSCULAR | Status: DC | PRN
  Administered 2018-04-25 (×2): 1 mg via INTRAVENOUS

## 2018-04-25 MED ORDER — SODIUM CHLORIDE 0.9 % IV SOLN
2.0000 g | Freq: Once | INTRAVENOUS | Status: AC
  Administered 2018-04-25: 2 g via INTRAVENOUS
  Filled 2018-04-25 (×2): qty 2

## 2018-04-25 MED ORDER — METOPROLOL SUCCINATE ER 50 MG PO TB24
50.0000 mg | ORAL_TABLET | ORAL | Status: DC
  Administered 2018-04-27 – 2018-04-30 (×3): 50 mg via ORAL
  Filled 2018-04-25 (×6): qty 1

## 2018-04-25 MED ORDER — HYDROMORPHONE HCL 1 MG/ML IJ SOLN
0.5000 mg | INTRAMUSCULAR | Status: DC | PRN
  Administered 2018-04-25: 0.5 mg via INTRAVENOUS
  Filled 2018-04-25: qty 0.5

## 2018-04-25 MED ORDER — BUPIVACAINE HCL (PF) 0.25 % IJ SOLN
INTRAMUSCULAR | Status: AC
  Filled 2018-04-25: qty 30

## 2018-04-25 MED ORDER — SODIUM CHLORIDE 0.9 % IV SOLN
INTRAVENOUS | Status: AC
  Filled 2018-04-25: qty 2

## 2018-04-25 MED ORDER — DEXAMETHASONE SODIUM PHOSPHATE 10 MG/ML IJ SOLN
INTRAMUSCULAR | Status: DC | PRN
  Administered 2018-04-25: 10 mg via INTRAVENOUS

## 2018-04-25 MED ORDER — IBUPROFEN 200 MG PO TABS
600.0000 mg | ORAL_TABLET | Freq: Four times a day (QID) | ORAL | Status: DC
  Filled 2018-04-25: qty 3

## 2018-04-25 MED ORDER — ONDANSETRON HCL 4 MG/2ML IJ SOLN
4.0000 mg | Freq: Four times a day (QID) | INTRAMUSCULAR | Status: DC | PRN
  Administered 2018-04-26 – 2018-04-27 (×2): 4 mg via INTRAVENOUS
  Filled 2018-04-25 (×2): qty 2

## 2018-04-25 MED ORDER — PROPOFOL 10 MG/ML IV BOLUS
INTRAVENOUS | Status: AC
  Filled 2018-04-25: qty 20

## 2018-04-25 MED ORDER — KCL IN DEXTROSE-NACL 20-5-0.45 MEQ/L-%-% IV SOLN
INTRAVENOUS | Status: DC
  Administered 2018-04-25: 20:00:00 via INTRAVENOUS
  Filled 2018-04-25 (×2): qty 1000

## 2018-04-25 MED ORDER — LACTATED RINGERS IV SOLN
INTRAVENOUS | Status: DC
  Administered 2018-04-25: 1000 mL via INTRAVENOUS

## 2018-04-25 SURGICAL SUPPLY — 59 items
ADH SKN CLS APL DERMABOND .7 (GAUZE/BANDAGES/DRESSINGS) ×4
AGENT HMST KT MTR STRL THRMB (HEMOSTASIS) ×2
ATTRACTOMAT 16X20 MAGNETIC DRP (DRAPES) ×4 IMPLANT
BLADE EXTENDED COATED 6.5IN (ELECTRODE) ×4 IMPLANT
BRR ADH 6X5 SEPRAFILM 1 SHT (MISCELLANEOUS)
CHLORAPREP W/TINT 26ML (MISCELLANEOUS) ×4 IMPLANT
CLIP VESOCCLUDE LG 6/CT (CLIP) ×4 IMPLANT
CLIP VESOCCLUDE MED 6/CT (CLIP) ×4 IMPLANT
CLIP VESOCCLUDE MED LG 6/CT (CLIP) ×2 IMPLANT
CONT SPEC 4OZ CLIKSEAL STRL BL (MISCELLANEOUS) IMPLANT
COVER WAND RF STERILE (DRAPES) IMPLANT
DERMABOND ADVANCED (GAUZE/BANDAGES/DRESSINGS) ×4
DERMABOND ADVANCED .7 DNX12 (GAUZE/BANDAGES/DRESSINGS) IMPLANT
DRAPE INCISE IOBAN 66X45 STRL (DRAPES) ×2 IMPLANT
DRAPE SURG IRRIG POUCH 19X23 (DRAPES) ×2 IMPLANT
DRAPE UNDERBUTTOCKS STRL (DRAPE) ×4 IMPLANT
DRAPE WARM FLUID 44X44 (DRAPE) ×4 IMPLANT
DRSG OPSITE POSTOP 4X12 (GAUZE/BANDAGES/DRESSINGS) ×2 IMPLANT
DRSG TEGADERM 4X4.75 (GAUZE/BANDAGES/DRESSINGS) ×2 IMPLANT
ELECT REM PT RETURN 15FT ADLT (MISCELLANEOUS) ×4 IMPLANT
GAUZE 4X4 16PLY RFD (DISPOSABLE) IMPLANT
GAUZE SPONGE 4X4 12PLY STRL (GAUZE/BANDAGES/DRESSINGS) ×2 IMPLANT
GLOVE BIOGEL PI IND STRL 7.0 (GLOVE) ×4 IMPLANT
GLOVE BIOGEL PI INDICATOR 7.0 (GLOVE) ×4
GLOVE SURG SS PI 6.5 STRL IVOR (GLOVE) ×8 IMPLANT
GOWN STRL REUS W/ TWL LRG LVL3 (GOWN DISPOSABLE) ×2 IMPLANT
GOWN STRL REUS W/TWL LRG LVL3 (GOWN DISPOSABLE) ×8 IMPLANT
HANDLE SUCTION POOLE (INSTRUMENTS) IMPLANT
HOLDER FOLEY CATH W/STRAP (MISCELLANEOUS) ×2 IMPLANT
KIT BASIN OR (CUSTOM PROCEDURE TRAY) ×4 IMPLANT
LIGASURE IMPACT 36 18CM CVD LR (INSTRUMENTS) ×4 IMPLANT
NEEDLE HYPO 22GX1.5 SAFETY (NEEDLE) ×8 IMPLANT
NS IRRIG 1000ML POUR BTL (IV SOLUTION) ×6 IMPLANT
PACK GENERAL/GYN (CUSTOM PROCEDURE TRAY) ×4 IMPLANT
POSITIONER SURGICAL ARM (MISCELLANEOUS) ×2 IMPLANT
RETAINER VISCERA MED (MISCELLANEOUS) IMPLANT
SEPRAFILM MEMBRANE 5X6 (MISCELLANEOUS) IMPLANT
SHEET LAVH (DRAPES) ×4 IMPLANT
SPONGE LAP 18X18 RF (DISPOSABLE) ×8 IMPLANT
SUCTION POOLE HANDLE (INSTRUMENTS) ×4
SURGIFLO W/THROMBIN 8M KIT (HEMOSTASIS) ×2 IMPLANT
SUT MNCRL AB 4-0 PS2 18 (SUTURE) ×12 IMPLANT
SUT PDS AB 1 TP1 96 (SUTURE) ×8 IMPLANT
SUT PLAIN 2 0 XLH (SUTURE) IMPLANT
SUT SILK 2 0 (SUTURE)
SUT SILK 2-0 18XBRD TIE 12 (SUTURE) IMPLANT
SUT VIC AB 0 CT1 18XCR BRD 8 (SUTURE) ×2 IMPLANT
SUT VIC AB 0 CT1 36 (SUTURE) ×20 IMPLANT
SUT VIC AB 0 CT1 8-18 (SUTURE) ×4
SUT VIC AB 4-0 PS2 18 (SUTURE) ×4 IMPLANT
SUT VICRYL 0 TIES 12 18 (SUTURE) ×4 IMPLANT
SUT VICRYL 3 0 BR 18  UND (SUTURE) ×2
SUT VICRYL 3 0 BR 18 UND (SUTURE) IMPLANT
SUT VICRYL 4-0 PS2 18IN ABS (SUTURE) ×4 IMPLANT
SYR 30ML LL (SYRINGE) ×8 IMPLANT
TOWEL OR 17X26 10 PK STRL BLUE (TOWEL DISPOSABLE) ×4 IMPLANT
TOWEL OR NON WOVEN STRL DISP B (DISPOSABLE) ×2 IMPLANT
TRAY FOLEY CATH 16FR SILVER (SET/KITS/TRAYS/PACK) ×2 IMPLANT
TRAY FOLEY MTR SLVR 14FR STAT (SET/KITS/TRAYS/PACK) ×2 IMPLANT

## 2018-04-25 NOTE — Anesthesia Postprocedure Evaluation (Signed)
Anesthesia Post Note  Patient: Shannon Ellis  Procedure(s) Performed: HYSTERECTOMY ABDOMINAL WITH BILATERAL SALPINGO-OOPHORECTOMY (Bilateral ) OMENTECTOMY (N/A ) DEBULKING (N/A )     Patient location during evaluation: PACU Anesthesia Type: General Level of consciousness: sedated Pain management: pain level controlled Vital Signs Assessment: post-procedure vital signs reviewed and stable Respiratory status: spontaneous breathing and respiratory function stable Cardiovascular status: stable Postop Assessment: no apparent nausea or vomiting Anesthetic complications: no    Last Vitals:  Vitals:   04/25/18 1630 04/25/18 1645  BP: 137/75 130/76  Pulse: 67 67  Resp: 13 12  Temp:    SpO2: 97% 97%    Last Pain:  Vitals:   04/25/18 1645  TempSrc:   PainSc: Asleep                 Anna Livers DANIEL

## 2018-04-25 NOTE — Anesthesia Procedure Notes (Signed)
Procedure Name: Intubation Date/Time: 04/25/2018 12:23 PM Performed by: Duane Boston, MD Pre-anesthesia Checklist: Patient identified, Emergency Drugs available, Suction available, Patient being monitored and Timeout performed Patient Re-evaluated:Patient Re-evaluated prior to induction Oxygen Delivery Method: Circle system utilized Preoxygenation: Pre-oxygenation with 100% oxygen Induction Type: IV induction and Cricoid Pressure applied Ventilation: Mask ventilation without difficulty Laryngoscope Size: Mac and 4 Grade View: Grade III Tube type: Oral Tube size: 7.5 mm Number of attempts: 2 Airway Equipment and Method: Stylet Placement Confirmation: ETT inserted through vocal cords under direct vision,  breath sounds checked- equal and bilateral and positive ETCO2 Secured at: 21 cm Tube secured with: Tape Dental Injury: Teeth and Oropharynx as per pre-operative assessment  Difficulty Due To: Difficulty was anticipated, Difficult Airway- due to anterior larynx and Difficult Airway- due to dentition Future Recommendations: Recommend- induction with short-acting agent, and alternative techniques readily available Comments: More anterior than expected.  Suggest glidescope next time.

## 2018-04-25 NOTE — Transfer of Care (Signed)
Immediate Anesthesia Transfer of Care Note  Patient: Shannon Ellis  Procedure(s) Performed: HYSTERECTOMY ABDOMINAL WITH BILATERAL SALPINGO-OOPHORECTOMY (Bilateral ) OMENTECTOMY (N/A ) DEBULKING (N/A )  Patient Location: PACU  Anesthesia Type:General  Level of Consciousness: awake, drowsy, patient cooperative, lethargic and responds to stimulation  Airway & Oxygen Therapy: Patient Spontanous Breathing and Patient connected to face mask oxygen  Post-op Assessment: Report given to RN, Post -op Vital signs reviewed and stable and Patient moving all extremities  Post vital signs: Reviewed and stable  Last Vitals:  Vitals Value Taken Time  BP 157/72 04/25/2018  3:46 PM  Temp    Pulse 67 04/25/2018  3:55 PM  Resp 13 04/25/2018  3:55 PM  SpO2 100 % 04/25/2018  3:55 PM  Vitals shown include unvalidated device data.  Last Pain:  Vitals:   04/25/18 1032  TempSrc:   PainSc: 0-No pain      Patients Stated Pain Goal: 4 (16/57/90 [id number])  Complications: No apparent anesthesia complications

## 2018-04-25 NOTE — Anesthesia Preprocedure Evaluation (Signed)
Anesthesia Evaluation  Patient identified by MRN, date of birth, ID band Patient awake    Reviewed: Allergy & Precautions, NPO status , Patient's Chart, lab work & pertinent test results, reviewed documented beta blocker date and time   History of Anesthesia Complications (+) PONV and history of anesthetic complications  Airway Mallampati: II  TM Distance: >3 FB Neck ROM: Full    Dental no notable dental hx. (+) Dental Advisory Given   Pulmonary neg pulmonary ROS,    Pulmonary exam normal        Cardiovascular hypertension, Pt. on home beta blockers and Pt. on medications Normal cardiovascular exam     Neuro/Psych PSYCHIATRIC DISORDERS Anxiety    GI/Hepatic negative GI ROS, Neg liver ROS,   Endo/Other  Hypothyroidism   Renal/GU negative Renal ROS     Musculoskeletal   Abdominal   Peds  Hematology   Anesthesia Other Findings   Reproductive/Obstetrics                             Anesthesia Physical Anesthesia Plan  ASA: III  Anesthesia Plan: General   Post-op Pain Management:    Induction: Intravenous  PONV Risk Score and Plan: 4 or greater and Ondansetron, Dexamethasone, Scopolamine patch - Pre-op and Diphenhydramine  Airway Management Planned: Oral ETT  Additional Equipment:   Intra-op Plan:   Post-operative Plan: Extubation in OR  Informed Consent: I have reviewed the patients History and Physical, chart, labs and discussed the procedure including the risks, benefits and alternatives for the proposed anesthesia with the patient or authorized representative who has indicated his/her understanding and acceptance.   Dental advisory given  Plan Discussed with: CRNA and Anesthesiologist  Anesthesia Plan Comments:         Anesthesia Quick Evaluation

## 2018-04-25 NOTE — Op Note (Signed)
OPERATIVE NOTE 04/25/18   Preoperative Diagnosis: Ovarian cancer s/p neoadjuvant chemotherapy   Postoperative Diagnosis:  Same  Procedure(s) Performed:   1. Exploratory laparotomy  2. TAH/Bilateral salpingo-oophorectomy with radical tumor debulking for ovarian cancer . 3. Omentectomy  Surgeon: Bernita Raisin, MD  Assistant Surgeon:  Lahoma Crocker M.D. (an MD assistant was necessary for tissue manipulation, management of instrumentation, retraction and positioning due to the complexity of the case and hospital policies).   Anesthesia: GETA  Specimens: Bilateral tubes and ovaries, omentum, sigmoid nodule, cecal nodule   Complications: None  Indication for Procedure:  Patient is s/p 3 cycles of chemotherapy with response to platinum, for interval debulking.  Operative Findings: Large left sided ovarian tumor wedged retroperitoneal and inferiorly into pelvis with some adherence to sigmoid mesentary. Some tumor nodules on surface of sigmoid epiploica that was removed. ~1-2 cm tumor implant lateral to cecum also excised. No obvious omental disease. This represented an optimal cytoreduction (R0) with no gross visible disease remaining. No carcinomatosis. Diaphragms and small bowel /mesentary all free of disease.  Procedure in Detail:  The patient was taken to the operating room and GETA induced. She was prepped and draped in the normal sterile fashion in the dorsal lithotomy position in padded Allen stirrups with good attention paid to support of the lower back and lower extremities. Position was adjusted for appropriate support. A Foley catheter was placed to gravity.   A midline vertical incision was made and carried through the subcutaneous tissue to the fascia. The fascial incision was made and extended superiorally. The rectus muscles were separated. The peritoneum was identified and entered sharply. The peritoneal incision was extended longitudinally.  A Bookwalter retractor was  then placed. A survey of the abdomen and pelvis revealed the above findings.   After packing the small bowel into the upper abdomen, I initiated the left salpingo-oophorectomy by entering the pelvic sidewall, elevating the round ligament, ligating then transecting. The pararectal space was developed and the retroperitoneum developed up to the level of the common iliac artery.  The course of the ureter was identified. The left IP was then skeletonized. Medially I had to separate the sigmoid from the IP ligament. Once this was accomplished we were able to isolate the left IP, then ligate, coagulate and transect with the Ligasure. The ovary was separated from its peritoneal and mesenteric attachments with the sharp, bovie, and gentle blunt dissection. The ureter was noted during dissection and some gentle ureterolysis performed. The mass was hemorrhagic and so I wanted to separate and deliver it first. Therefore I took down the uteroovarian ligament with the Ligasure. Once separated from the surrounding structures the mass was removed and sent for permanent section.   Hysterectomy and removal of the right adnexa was initiated. Kelly clamps were placed on both uterine cornua. The right round ligament was identified and transected with the bovie. The anterior peritoneal reflection was incised and the bladder was dissected off the lower uterine segment. Serial pedicles of the cardinal and utero-sacral ligaments were clamped, coagulated and transected with the Ligasure. Curved clamps placed at the cervico-vaginal junction and the specimen amputated. The vaginal cuff angles were suture ligated with 0-Vicryl suture. The vaginal cuff was then closed with 0-Vicryl figure of 8 interrupted sutures. Hemostasis was obtained in the left pelvis.  Peritoneal implants on the sigmoid and cecum were excised.   No palpable lymphadenopathy.   The omentum was dissected free from the transverse colon using cautery. The lesser sac  was  entered. The omentum was separated from the mesentery of the transverse colon and ligasure was used to transect the specimen. No visible/bulky tumor. Hemostasis was confirmed. The colon was closely inspected and was noted to be intact and hemostatic.  The peritoneal cavity was irrigated and hemostasis was obtained using cautery and Surgiflo.   Local anesthetic was injected.  The fascia was reapproximated with 0 looped PDS using a total of two sutures. The subcutaneous layer was then irrigated copiously.  The subcutaneous layer was then reapproximated with interrupted 4-0 Vicryl and then running 4-0 Monocryl.    The patient tolerated the procedure well. Sponge, lap and needle counts were correct x 2.   Disposition: PACU -Stable

## 2018-04-25 NOTE — Interval H&P Note (Signed)
History and Physical Interval Note:  04/25/2018 11:39 AM  Shannon Ellis  has presented today for surgery, with the diagnosis of OVARIAN CANCER  The various methods of treatment have been discussed with the patient and family. After consideration of risks, benefits and other options for treatment, the patient has consented to  Procedure(s): EXPLORATORY LAPAROTOMY AND POSSIBLE BOWEL RESECTION (N/A) HYSTERECTOMY ABDOMINAL WITH SALPINGO-OOPHORECTOMY (N/A) OMENTECTOMY (N/A) DEBULKING (N/A) as a surgical intervention .  The patient's history has been reviewed, patient examined, no change in status, stable for surgery.  I have reviewed the patient's chart and labs.  Questions were answered to the patient's satisfaction.     Shannon Ellis

## 2018-04-26 ENCOUNTER — Telehealth: Payer: Self-pay | Admitting: Oncology

## 2018-04-26 ENCOUNTER — Encounter (HOSPITAL_COMMUNITY): Payer: Self-pay | Admitting: Obstetrics

## 2018-04-26 LAB — BASIC METABOLIC PANEL
Anion gap: 5 (ref 5–15)
BUN: 16 mg/dL (ref 8–23)
CALCIUM: 8.6 mg/dL — AB (ref 8.9–10.3)
CO2: 25 mmol/L (ref 22–32)
CREATININE: 0.79 mg/dL (ref 0.44–1.00)
Chloride: 104 mmol/L (ref 98–111)
GFR calc Af Amer: >60 mL/min (ref >60)
GLUCOSE: 195 mg/dL — AB (ref 70–99)
Potassium: 4.4 mmol/L (ref 3.5–5.1)
Sodium: 134 mmol/L — ABNORMAL LOW (ref 135–145)

## 2018-04-26 LAB — HEMOGLOBIN AND HEMATOCRIT, BLOOD
HEMATOCRIT: 25.8 % — AB (ref 36.0–46.0)
HEMOGLOBIN: 8.1 g/dL — AB (ref 12.0–15.0)

## 2018-04-26 LAB — CBC
HEMATOCRIT: 29.4 % — AB (ref 36.0–46.0)
Hemoglobin: 9.2 g/dL — ABNORMAL LOW (ref 12.0–15.0)
MCH: 28 pg (ref 26.0–34.0)
MCHC: 31.3 g/dL (ref 30.0–36.0)
MCV: 89.6 fL (ref 80.0–100.0)
PLATELETS: 256 10*3/uL (ref 150–400)
RBC: 3.28 MIL/uL — ABNORMAL LOW (ref 3.87–5.11)
RDW: 15.8 % — ABNORMAL HIGH (ref 11.5–15.5)
WBC: 14.7 10*3/uL — AB (ref 4.0–10.5)
nRBC: 0 % (ref 0.0–0.2)

## 2018-04-26 LAB — POCT I-STAT 4, (NA,K, GLUC, HGB,HCT)
Glucose, Bld: 134 mg/dL — ABNORMAL HIGH (ref 70–99)
HCT: 27 % — ABNORMAL LOW (ref 36.0–46.0)
Hemoglobin: 9.2 g/dL — ABNORMAL LOW (ref 12.0–15.0)
Potassium: 3.8 mmol/L (ref 3.5–5.1)
Sodium: 139 mmol/L (ref 135–145)

## 2018-04-26 MED ORDER — SODIUM CHLORIDE 0.9 % IV BOLUS
500.0000 mL | Freq: Once | INTRAVENOUS | Status: AC
  Administered 2018-04-26: 500 mL via INTRAVENOUS

## 2018-04-26 MED ORDER — IBUPROFEN 200 MG PO TABS: 600.0000 mg | ORAL_TABLET | Freq: Four times a day (QID) | ORAL | Status: DC | PRN

## 2018-04-26 MED ORDER — ACETAMINOPHEN 500 MG PO TABS
1000.0000 mg | ORAL_TABLET | Freq: Four times a day (QID) | ORAL | Status: DC
  Administered 2018-04-26 (×3): 1000 mg via ORAL
  Filled 2018-04-26 (×3): qty 2

## 2018-04-26 MED ORDER — LORATADINE 10 MG PO TABS
10.0000 mg | ORAL_TABLET | Freq: Every day | ORAL | Status: DC | PRN
  Administered 2018-04-26 – 2018-04-27 (×2): 10 mg via ORAL
  Filled 2018-04-26 (×2): qty 1

## 2018-04-26 MED ORDER — SODIUM CHLORIDE 0.9 % IV SOLN
INTRAVENOUS | Status: DC
  Administered 2018-04-26: 09:00:00 via INTRAVENOUS

## 2018-04-26 MED ORDER — ENOXAPARIN (LOVENOX) PATIENT EDUCATION KIT
PACK | Freq: Once | Status: AC
  Administered 2018-04-26: 10:00:00
  Filled 2018-04-26: qty 1

## 2018-04-26 NOTE — Telephone Encounter (Signed)
Shannon Ellis's daughter Shannon Ellis called and would like to make sure that chemotherapy is scheduled for 3 weeks from surgery.  Advised her that we will schedule her to see Dr. Derrill Center as soon as she is discharged and call back with the appointment.

## 2018-04-26 NOTE — Telephone Encounter (Signed)
Called [name] back with apt with Dr. Derrill Center on 05/06/18 at 8:30 am.

## 2018-04-26 NOTE — Progress Notes (Addendum)
1 Day Post-Op Procedure(s) (LRB): HYSTERECTOMY ABDOMINAL WITH BILATERAL SALPINGO-OOPHORECTOMY (Bilateral) OMENTECTOMY (N/A) DEBULKING (N/A)  Subjective: Patient reports soreness this am but no significant incisional pain.  Up with assist.  Due to void since foley removal. Tolerating liquids.  Waiting for sold food to arrive for breakfast. No nausea or emesis reported. Reporting a scratchy throat. Denies chest pain, dyspnea, passing flatus or having a BM. No concerns voiced.  Objective: Vital signs in last 24 hours: Temp:  [97.4 F (36.3 C)-98.3 F (36.8 C)] 98.3 F (36.8 C) (11/08 0916) Pulse Rate:  [65-92] 80 (11/08 0916) Resp:  [11-49] 18 (11/08 0950) BP: (108-157)/(57-98) 108/57 (11/08 0916) SpO2:  [93 %-100 %] 99 % (11/08 0916) Weight:  [165 lb (74.8 kg)] 165 lb (74.8 kg) (11/07 1032)    Intake/Output from previous day: 11/07 0701 - 11/08 0700 In: 3650 [P.O.:360; I.V.:2690; IV Piggyback:600] Out: 2125 [Urine:1025; Blood:700]  Physical Examination (full exam performed by Dr. Sherryl Barters addendum): General: alert, cooperative and no distress Extremities: extremities normal, atraumatic, no cyanosis or edema  Midline incision with op site dressing in place. Dsg clean, dry, intact with no active drainage noted.  Labs: WBC/Hgb/Hct/Plts:  14.7/9.2/29.4/256 (11/08 1856) BUN/Cr/glu/ALT/AST/amyl/lip:  16/0.79/--/--/--/--/-- (11/08 0429)  Assessment: 84 y.o. s/p Procedure(s): HYSTERECTOMY ABDOMINAL WITH BILATERAL SALPINGO-OOPHORECTOMY OMENTECTOMY DEBULKING: stable Pain:  Pain is well-controlled on PRN medications.  Heme: Hgb 9.2 and Hct 29.4 this am. Appropriate. Plan for repeat labs in the am.  CV: BP and HR stable. Continue to monitor with routine ordered vital signs. Norvasc ordered.  GI:  Tolerating po: Yes. Scopolamine patch in place.  GU: Due to void since foley removal. Adequate output reported.    FEN: Mildly hyponatremic- IVF changed. No critical  values.  Prophylaxis: Lovenox post-op and SCDs.   Plan: Labs for tomorrow am Diet as tolerated IVF change to NS at 30 cc/hr Lovenox teaching kit for home use Kpad  Encourage ambulation, IS use, deep breathing, coughing, chewing gum to assist with return of bowel function Continue plan of care per Dr. Gerarda Fraction   LOS: 1 day    Melissa D Cross 04/26/2018, 10:19 AM  Lungs CTA CV RRR Abdo occasional BS. Soft, NTND. Dressing CDI Ext +SCD

## 2018-04-27 ENCOUNTER — Other Ambulatory Visit: Payer: Self-pay

## 2018-04-27 ENCOUNTER — Inpatient Hospital Stay (HOSPITAL_COMMUNITY): Payer: Medicare Other

## 2018-04-27 ENCOUNTER — Encounter: Payer: Self-pay | Admitting: Obstetrics

## 2018-04-27 LAB — BASIC METABOLIC PANEL
ANION GAP: 5 (ref 5–15)
BUN: 15 mg/dL (ref 8–23)
CALCIUM: 8.2 mg/dL — AB (ref 8.9–10.3)
CO2: 23 mmol/L (ref 22–32)
Chloride: 102 mmol/L (ref 98–111)
Creatinine, Ser: 0.63 mg/dL (ref 0.44–1.00)
Glucose, Bld: 152 mg/dL — ABNORMAL HIGH (ref 70–99)
POTASSIUM: 3.9 mmol/L (ref 3.5–5.1)
Sodium: 130 mmol/L — ABNORMAL LOW (ref 135–145)

## 2018-04-27 LAB — HEMOGLOBIN AND HEMATOCRIT, BLOOD
HEMATOCRIT: 26.8 % — AB (ref 36.0–46.0)
HEMOGLOBIN: 8.4 g/dL — AB (ref 12.0–15.0)

## 2018-04-27 LAB — MAGNESIUM: Magnesium: 1.8 mg/dL (ref 1.7–2.4)

## 2018-04-27 MED ORDER — PHENOL 1.4 % MT LIQD
1.0000 | OROMUCOSAL | Status: DC | PRN
  Filled 2018-04-27: qty 177

## 2018-04-27 MED ORDER — SODIUM CHLORIDE 0.9 % IV BOLUS
250.0000 mL | Freq: Once | INTRAVENOUS | Status: AC
  Administered 2018-04-27: 250 mL via INTRAVENOUS

## 2018-04-27 MED ORDER — SODIUM CHLORIDE 0.9 % IV SOLN
INTRAVENOUS | Status: DC
  Filled 2018-04-27: qty 1000

## 2018-04-27 MED ORDER — SODIUM CHLORIDE 0.9% FLUSH
10.0000 mL | INTRAVENOUS | Status: DC | PRN
  Administered 2018-04-30: 10 mL
  Filled 2018-04-27: qty 40

## 2018-04-27 MED ORDER — PROMETHAZINE HCL 25 MG/ML IJ SOLN
12.5000 mg | Freq: Once | INTRAMUSCULAR | Status: AC
  Administered 2018-04-27: 12.5 mg via INTRAVENOUS
  Filled 2018-04-27: qty 1

## 2018-04-27 MED ORDER — PANTOPRAZOLE SODIUM 40 MG IV SOLR
40.0000 mg | INTRAVENOUS | Status: DC
  Administered 2018-04-27 – 2018-04-29 (×3): 40 mg via INTRAVENOUS
  Filled 2018-04-27 (×3): qty 40

## 2018-04-27 MED ORDER — POTASSIUM CHLORIDE IN NACL 20-0.9 MEQ/L-% IV SOLN
INTRAVENOUS | Status: AC
  Administered 2018-04-27 – 2018-04-28 (×3): via INTRAVENOUS
  Filled 2018-04-27 (×2): qty 1000

## 2018-04-27 MED ORDER — ACETAMINOPHEN 10 MG/ML IV SOLN
1000.0000 mg | Freq: Four times a day (QID) | INTRAVENOUS | Status: AC
  Administered 2018-04-27 – 2018-04-28 (×4): 1000 mg via INTRAVENOUS
  Filled 2018-04-27 (×4): qty 100

## 2018-04-27 NOTE — Progress Notes (Signed)
NG advanced 8cm and taped in place. Pt tolerated well

## 2018-04-27 NOTE — Evaluation (Signed)
Physical Therapy Evaluation Patient Details Name: Shannon Ellis MRN: [id number] DOB: 26-Dec-1931 Today's Date: 04/27/2018   History of Present Illness  84 yo female with ovarian CA was admitted for salpingo-oophorectomy, omentectomy and debulking of tumor and now referred to PT for mobility.  Currently on NG tube for recent vomiting and more comfortable now.  PMHx:  chemotherapy, pelvic mass,   Clinical Impression  Pt was seen for evaluation of mobility with strength tested, and noted her ability to move with assistance under controlled conditions.  Pt is hoping to return home to live with independence but for now recommend rehab to restore safety with gait, improve balance and increase safety with strengthening and flexibility to LE's given her leg length discrepancy from previous hip surgery.  Follow acutely for same to transition care to SNF.    Follow Up Recommendations SNF    Equipment Recommendations  None recommended by PT    Recommendations for Other Services       Precautions / Restrictions Precautions Precautions: Fall Precaution Comments: monitor due to leg length differenc Restrictions Weight Bearing Restrictions: No      Mobility  Bed Mobility Overal bed mobility: Needs Assistance Bed Mobility: Supine to Sit     Supine to sit: Mod assist     General bed mobility comments: assisted trunk to get off the bed  Transfers Overall transfer level: Needs assistance Equipment used: Rolling walker (2 wheeled);1 person hand held assist Transfers: Sit to/from Stand Sit to Stand: Min assist;Mod assist         General transfer comment: mod assist to power up and then with practice was mod just for initial clearance then min for safety  Ambulation/Gait Ambulation/Gait assistance: Min guard Gait Distance (Feet): 225 Feet Assistive device: Rolling walker (2 wheeled);1 person hand held assist Gait Pattern/deviations: Step-through pattern;Decreased stride length;Wide  base of support;Trunk flexed Gait velocity: reduced Gait velocity interpretation: <1.8 ft/sec, indicate of risk for recurrent falls General Gait Details: pt was assisted to walk the hallway with close guard of chair by her daughter, but also maintaining cues for safety on walker  Stairs            Wheelchair Mobility    Modified Rankin (Stroke Patients Only)       Balance Overall balance assessment: Needs assistance(pt has shorter R 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 hip surgery in last couple years) Sitting-balance support: Feet supported Sitting balance-Leahy Scale: Good   Postural control: Posterior lean Standing balance support: Bilateral upper extremity supported;During functional activity Standing balance-Leahy Scale: Poor                               Pertinent Vitals/Pain Pain Assessment: No/denies pain    Home Living Family/patient expects to be discharged to:: Private residence Living Arrangements: Alone Available Help at Discharge: Family;Available PRN/intermittently Type of Home: House Home Access: Stairs to enter Entrance Stairs-Rails: Left;Right;Can reach both Entrance Stairs-Number of Steps: 4 Home Layout: One level Home Equipment: None      Prior Function Level of Independence: Independent               Hand Dominance   Dominant Hand: Right    Extremity/Trunk Assessment   Upper Extremity Assessment Upper Extremity Assessment: Overall WFL for tasks assessed    Lower Extremity Assessment Lower Extremity Assessment: Generalized weakness    Cervical / Trunk Assessment Cervical / Trunk Assessment: Normal  Communication   Communication: HOH  Cognition Arousal/Alertness:  Awake/alert Behavior During Therapy: Flat affect Overall Cognitive Status: Impaired/Different from baseline Area of Impairment: Following commands;Safety/judgement;Awareness;Problem solving                       Following Commands: Follows one step commands with  increased time Safety/Judgement: Decreased awareness of safety;Decreased awareness of deficits Awareness: Intellectual Problem Solving: Slow processing;Decreased initiation;Requires verbal cues;Requires tactile cues General Comments: pt was independent living prior to her surgery this week      General Comments General comments (skin integrity, edema, etc.): pt requires RW and close guard due to weakness, unsteady gait and difficulty with controlling transfers    Exercises     Assessment/Plan    PT Assessment Patient needs continued PT services  PT Problem List Decreased strength;Decreased range of motion;Decreased activity tolerance;Decreased balance;Decreased mobility;Decreased coordination;Decreased cognition;Decreased knowledge of use of DME;Decreased safety awareness;Decreased skin integrity       PT Treatment Interventions DME instruction;Gait training;Stair training;Functional mobility training;Therapeutic activities;Therapeutic exercise;Balance training;Neuromuscular re-education;Patient/family education    PT Goals (Current goals can be found in the Care Plan section)  Acute Rehab PT Goals Patient Stated Goal: none stated PT Goal Formulation: With patient/family Time For Goal Achievement: 05/11/18 Potential to Achieve Goals: Good    Frequency Min 2X/week   Barriers to discharge Inaccessible home environment;Decreased caregiver support stairs to get in house and has daughter near but not in the house    Co-evaluation               AM-PAC PT "6 Clicks" Daily Activity  Outcome Measure Difficulty turning over in bed (including adjusting bedclothes, sheets and blankets)?: A Little Difficulty moving from lying on back to sitting on the side of the bed? : Unable Difficulty sitting down on and standing up from a chair with arms (e.g., wheelchair, bedside commode, etc,.)?: Unable Help needed moving to and from a bed to chair (including a wheelchair)?: A Lot Help  needed walking in hospital room?: A Lot Help needed climbing 3-5 steps with a railing? : Total 6 Click Score: 10    End of Session Equipment Utilized During Treatment: Gait belt Activity Tolerance: Patient tolerated treatment well;Patient limited by fatigue Patient left: in chair;with call bell/phone within reach;with family/visitor present;with nursing/sitter in room Nurse Communication: Mobility status PT Visit Diagnosis: Unsteadiness on feet (R26.81);Muscle weakness (generalized) (M62.81);Difficulty in walking, not elsewhere classified (R26.2)    Time: 6811-5726 PT Time Calculation (min) (ACUTE ONLY): 31 min   Charges:   PT Evaluation $PT Eval Moderate Complexity: 1 Mod PT Treatments $Gait Training: 8-22 mins        Ramond Dial 04/27/2018, 2:36 PM   Mee Hives, PT MS Acute Rehab Dept. Number: [place] [facility] [place]

## 2018-04-27 NOTE — Progress Notes (Signed)
2 Days Post-Op Procedure(s) (LRB): HYSTERECTOMY ABDOMINAL WITH BILATERAL SALPINGO-OOPHORECTOMY (Bilateral) OMENTECTOMY (N/A) DEBULKING (N/A)  1529/1529-01  Past Medical History:  Diagnosis Date  . Allergy   . Anemia   . Blood transfusion without reported diagnosis   . Cancer ([place])   . Complication of anesthesia    waking up-does not take much medication  . Family history of adverse reaction to anesthesia    problems waking up as does not need much medication  . Hypertension   . Hypothyroidism   . Osteoporosis 10/07   Dexa scan  . PONV (postoperative nausea and vomiting)      Subjective: Patient reports nausea/emesis 2-3 times starting ~midnight overnight; last episode measured at 400cc. Other episodes volume not recorded. Feels weak. Feels distended and a bulge in upper abdomen. States baseline SOB with some worsening on ambulation.  Pain tolerable, more achy in her abdomen. Denies chest pain.  Objective: Vital signs in last 24 hours: Temp:  [98 F (36.7 C)-98.7 F (37.1 C)] 98 F (36.7 C) (11/09 0536) Pulse Rate:  [80-118] 118 (11/09 0536) Resp:  [15-49] 18 (11/09 0536) BP: (108-153)/(57-86) 153/86 (11/09 0536) SpO2:  [92 %-99 %] 92 % (11/09 0536)    Intake/Output from previous day: 11/08 0701 - 11/09 0700 In: 1858.7 [P.O.:770; I.V.:838.7; IV Piggyback:250] Out: 1800 [Urine:1400; Emesis/NG output:400] No data found.   Physical Examination: General: alert, fatigued and no distress Resp: clear to auscultation bilaterally Cardio: tachy, regular rhythm GI: soft, mild distended, nontender. no rebound. Hypoactive BS. Dressing CDI Extremities: extremities normal, atraumatic, no cyanosis or edema  Labs: WBC/Hgb/Hct/Plts:  --/8.4/26.8/-- (11/09 0349) BUN/Cr/glu/ALT/AST/amyl/lip:  15/0.63/--/--/--/--/-- (11/09 0349)  CBC Latest Ref Rng & Units 04/27/2018 04/26/2018 04/26/2018  WBC 4.0 - 10.5 K/uL - - 14.7(H)  Hemoglobin 12.0 - 15.0 g/dL 8.4(L) 8.1(L) 9.2(L)  Hematocrit  36.0 - 46.0 % 26.8(L) 25.8(L) 29.4(L)  Platelets 150 - 400 K/uL - - 256   BMP Latest Ref Rng & Units 04/27/2018 04/26/2018 04/25/2018  Glucose 70 - 99 mg/dL 152(H) 195(H) 134(H)  BUN 8 - 23 mg/dL 15 16 -  Creatinine 0.44 - 1.00 mg/dL 0.63 0.79 -  Sodium 135 - 145 mmol/L 130(L) 134(L) 139  Potassium 3.5 - 5.1 mmol/L 3.9 4.4 3.8  Chloride 98 - 111 mmol/L 102 104 -  CO2 22 - 32 mmol/L 23 25 -  Calcium 8.9 - 10.3 mg/dL 8.2(L) 8.6(L) -      Assessment/Plan:  84 y.o. s/p Procedure(s): HYSTERECTOMY ABDOMINAL WITH BILATERAL SALPINGO-OOPHORECTOMY OMENTECTOMY DEBULKING: ileus present  Pain:  Pain is controlled medications.  Neuro: no issues  Pulm: no issues; O2 sat 92% RA. Baseline SOB; monitor  Heme: Anemia: stable since labs pm yesterday  CV:  Tachy; check EKG; likely due to volume depletion  Prophylaxis: pharmacologic prophylaxis (with any of the following: lovenox and SCD).  GI:    . Tolerating po: No: N/V o NPO    . Pt with ileus. o  distended bowel loops. NG too high; repeating this am.   . GI stress ulcer prophylaxis includes: proton pump inhibitor per orders. . Treatment for N/V: zofran intravenous. Using Phenergan sparingly one time order.  GU: . adequate urine output after issues yesterday  FEN:  . IVF at 100cc o Appears volume depleted based on labs/tachycardia plus NPO now so . Elec Hyponatremic; suspect volume related; will continue NS and reassess in am . Diet NPO now  Endo: no issues.    ID: no issues  Gyn: no bleeding from cuff  reported  Consults: Physical therapy   The patient is to be discharged to home.   LOS: 2 days    [name] 04/27/2018, 8:55 AM

## 2018-04-27 NOTE — Progress Notes (Signed)
Since 2300 tonight pt has had 2 episodes of emesis despite receiving 2 different antiemetics. MD notified and orders received. Pt and family at bedside given update and verbalize understanding.

## 2018-04-27 NOTE — Progress Notes (Signed)
NG tube advanced 2 cm based off xray results. NG tube now placed on LIWS. Will continue to monitor.

## 2018-04-28 LAB — CBC
HEMATOCRIT: 23.5 % — AB (ref 36.0–46.0)
HEMOGLOBIN: 7.3 g/dL — AB (ref 12.0–15.0)
MCH: 28.6 pg (ref 26.0–34.0)
MCHC: 31.1 g/dL (ref 30.0–36.0)
MCV: 92.2 fL (ref 80.0–100.0)
NRBC: 0 % (ref 0.0–0.2)
Platelets: 217 10*3/uL (ref 150–400)
RBC: 2.55 MIL/uL — AB (ref 3.87–5.11)
RDW: 16.8 % — ABNORMAL HIGH (ref 11.5–15.5)
WBC: 8.3 10*3/uL (ref 4.0–10.5)

## 2018-04-28 LAB — MAGNESIUM: MAGNESIUM: 1.7 mg/dL (ref 1.7–2.4)

## 2018-04-28 LAB — BPAM RBC
Blood Product Expiration Date: 201911302359
Blood Product Expiration Date: 201911302359
ISSUE DATE / TIME: 201911071346
ISSUE DATE / TIME: 201911071346
UNIT TYPE AND RH: 6200
Unit Type and Rh: 6200

## 2018-04-28 LAB — COMPREHENSIVE METABOLIC PANEL
ALK PHOS: 78 U/L (ref 38–126)
ALT: 18 U/L (ref 0–44)
AST: 19 U/L (ref 15–41)
Albumin: 2.6 g/dL — ABNORMAL LOW (ref 3.5–5.0)
Anion gap: 5 (ref 5–15)
BILIRUBIN TOTAL: 0.7 mg/dL (ref 0.3–1.2)
BUN: 8 mg/dL (ref 8–23)
CALCIUM: 7.8 mg/dL — AB (ref 8.9–10.3)
CO2: 25 mmol/L (ref 22–32)
CREATININE: 0.48 mg/dL (ref 0.44–1.00)
Chloride: 103 mmol/L (ref 98–111)
GFR calc Af Amer: >60 mL/min (ref >60)
GFR calc non Af Amer: >60 mL/min (ref >60)
Glucose, Bld: 118 mg/dL — ABNORMAL HIGH (ref 70–99)
Potassium: 3.6 mmol/L (ref 3.5–5.1)
Sodium: 133 mmol/L — ABNORMAL LOW (ref 135–145)
TOTAL PROTEIN: 5.5 g/dL — AB (ref 6.5–8.1)

## 2018-04-28 LAB — TYPE AND SCREEN
ABO/RH(D): A POS
Antibody Screen: NEGATIVE
UNIT DIVISION: 0
Unit division: 0

## 2018-04-28 LAB — PREPARE RBC (CROSSMATCH)

## 2018-04-28 MED ORDER — ACETAMINOPHEN 10 MG/ML IV SOLN
1000.0000 mg | Freq: Four times a day (QID) | INTRAVENOUS | Status: DC
  Administered 2018-04-28 – 2018-04-29 (×3): 1000 mg via INTRAVENOUS
  Filled 2018-04-28 (×4): qty 100

## 2018-04-28 MED ORDER — POTASSIUM CHLORIDE 2 MEQ/ML IV SOLN: INTRAVENOUS | Status: DC

## 2018-04-28 MED ORDER — KCL IN DEXTROSE-NACL 20-5-0.9 MEQ/L-%-% IV SOLN
INTRAVENOUS | Status: DC
  Administered 2018-04-28 (×2): via INTRAVENOUS
  Filled 2018-04-28 (×3): qty 1000

## 2018-04-28 MED ORDER — FUROSEMIDE 10 MG/ML IJ SOLN
20.0000 mg | Freq: Once | INTRAMUSCULAR | Status: AC
  Administered 2018-04-28: 20 mg via INTRAVENOUS
  Filled 2018-04-28: qty 2

## 2018-04-28 MED ORDER — DIPHENHYDRAMINE HCL 50 MG/ML IJ SOLN
12.5000 mg | Freq: Once | INTRAMUSCULAR | Status: AC
  Administered 2018-04-28: 12.5 mg via INTRAVENOUS
  Filled 2018-04-28: qty 1

## 2018-04-28 MED ORDER — SODIUM CHLORIDE 0.9% IV SOLUTION
Freq: Once | INTRAVENOUS | Status: AC
  Administered 2018-04-28: 21:00:00 via INTRAVENOUS

## 2018-04-28 NOTE — Progress Notes (Signed)
I have reviewed and concur with this student's documentation.   

## 2018-04-28 NOTE — Progress Notes (Signed)
3 Days Post-Op Procedure(s) (LRB): HYSTERECTOMY ABDOMINAL WITH BILATERAL SALPINGO-OOPHORECTOMY (Bilateral) OMENTECTOMY (N/A) DEBULKING (N/A)  1529/1529-01  Past Medical History:  Diagnosis Date  . Allergy   . Anemia   . Blood transfusion without reported diagnosis   . Cancer ([place])   . Complication of anesthesia    waking up-does not take much medication  . Family history of adverse reaction to anesthesia    problems waking up as does not need much medication  . Hypertension   . Hypothyroidism   . Osteoporosis 10/07   Dexa scan  . PONV (postoperative nausea and vomiting)      Subjective: Patient reports feeling better. Has walked several times. Passing flatus. Still some fatigue. Denies SOB/CP.  Objective: Vital signs in last 24 hours: Temp:  [97.8 F (36.6 C)-98.8 F (37.1 C)] 97.8 F (36.6 C) (11/09 2054) Pulse Rate:  [86-112] 86 (11/10 0528) Resp:  [16-18] 16 (11/10 0528) BP: (125-157)/(74-83) 147/77 (11/10 0528) SpO2:  [94 %] 94 % (11/10 0528)    Intake/Output from previous day: 11/09 0701 - 11/10 0700 In: 2769.5 [I.V.:2427; IV Piggyback:342.5] Out: 3000 [Urine:2650; Emesis/NG output:350] No data found.   Physical Examination: General: alert and no distress Resp: clear to auscultation bilaterally Cardio: regular rate and rhythm GI: soft, non-tender; bowel sounds normal; no masses,  no organomegaly Extremities: extremities normal, atraumatic, no cyanosis or edema  Labs: WBC/Hgb/Hct/Plts:  8.3/7.3/23.5/217 (11/10 0411) BUN/Cr/glu/ALT/AST/amyl/lip:  8/0.48/--/18/19/--/-- (11/10 0411)  CBC Latest Ref Rng & Units 04/28/2018 04/27/2018 04/26/2018  WBC 4.0 - 10.5 K/uL 8.3 - -  Hemoglobin 12.0 - 15.0 g/dL 7.3(L) 8.4(L) 8.1(L)  Hematocrit 36.0 - 46.0 % 23.5(L) 26.8(L) 25.8(L)  Platelets 150 - 400 K/uL 217 - -   BMP Latest Ref Rng & Units 04/28/2018 04/27/2018 04/26/2018  Glucose 70 - 99 mg/dL 118(H) 152(H) 195(H)  BUN 8 - 23 mg/dL 8 15 16   Creatinine 0.44 -  1.00 mg/dL 0.48 0.63 0.79  Sodium 135 - 145 mmol/L 133(L) 130(L) 134(L)  Potassium 3.5 - 5.1 mmol/L 3.6 3.9 4.4  Chloride 98 - 111 mmol/L 103 102 104  CO2 22 - 32 mmol/L 25 23 25   Calcium 8.9 - 10.3 mg/dL 7.8(L) 8.2(L) 8.6(L)      Assessment/Plan:  84 y.o. s/p Procedure(s): HYSTERECTOMY ABDOMINAL WITH BILATERAL SALPINGO-OOPHORECTOMY OMENTECTOMY DEBULKING: ileus present  Pain:  Pain is controlled medications.  Neuro: no issues  Pulm: no issues; O2 sat 94% RA. Baseline SOB; monitor  Heme: Anemia: decreased to <8 today. Symptomatic with fatigue and chemo planned after surgery. Will transfuse 2 U PRBC.   CV:  EKG yesterday done; likely due to volume depletion; now improved.  Prophylaxis: pharmacologic prophylaxis (with any of the following: lovenox and SCD).  GI:    . Tolerating po: No: o NPO    . Pt with ileus. o  distended bowel loops. NG too high; repeating this am.   . GI stress ulcer prophylaxis includes: proton pump inhibitor per orders. . Treatment for N/V: zofran intravenous. Using Phenergan sparingly one time order. Marland Kitchen NGT with <500cc / 24 hour. Will dc today. Discussed with patient this may need to be replaced if worsening.  GU: . adequate urine output for 48 hours now. . Will dc foley  FEN:  . IVF at 100cc given NPO status . Elec Hyponatremic; but improved over yesterday; suspect volume related; will continue NS and reassess in am . Diet NPO until tomorrow assessment  Endo: no issues.    ID: no issues  Gyn:  no bleeding from cuff reported  Consults: Physical therapy to continue to work with patient and give any recommendations if home PT needed or other.  The patient is to be discharged to home.   LOS: 3 days    [name] 04/28/2018, 10:43 AM

## 2018-04-29 LAB — BPAM RBC
BLOOD PRODUCT EXPIRATION DATE: 201912042359
BLOOD PRODUCT EXPIRATION DATE: 201912062359
ISSUE DATE / TIME: 201911101442
ISSUE DATE / TIME: 201911102022
UNIT TYPE AND RH: 6200
Unit Type and Rh: 6200

## 2018-04-29 LAB — TYPE AND SCREEN
ABO/RH(D): A POS
ANTIBODY SCREEN: NEGATIVE
UNIT DIVISION: 0
Unit division: 0

## 2018-04-29 LAB — BASIC METABOLIC PANEL
Anion gap: 7 (ref 5–15)
BUN: 7 mg/dL — ABNORMAL LOW (ref 8–23)
CALCIUM: 8.6 mg/dL — AB (ref 8.9–10.3)
CO2: 27 mmol/L (ref 22–32)
Chloride: 105 mmol/L (ref 98–111)
Creatinine, Ser: 0.51 mg/dL (ref 0.44–1.00)
GFR calc Af Amer: >60 mL/min (ref >60)
GFR calc non Af Amer: >60 mL/min (ref >60)
GLUCOSE: 110 mg/dL — AB (ref 70–99)
Potassium: 3.5 mmol/L (ref 3.5–5.1)
SODIUM: 139 mmol/L (ref 135–145)

## 2018-04-29 LAB — HEMOGLOBIN AND HEMATOCRIT, BLOOD
HCT: 32.7 % — ABNORMAL LOW (ref 36.0–46.0)
HEMOGLOBIN: 10.5 g/dL — AB (ref 12.0–15.0)

## 2018-04-29 LAB — MAGNESIUM: Magnesium: 1.8 mg/dL (ref 1.7–2.4)

## 2018-04-29 MED ORDER — ACETAMINOPHEN 500 MG PO TABS
1000.0000 mg | ORAL_TABLET | Freq: Four times a day (QID) | ORAL | Status: DC
  Administered 2018-04-29 – 2018-04-30 (×4): 1000 mg via ORAL
  Filled 2018-04-29 (×5): qty 2

## 2018-04-29 MED ORDER — DOCUSATE SODIUM 100 MG PO CAPS
100.0000 mg | ORAL_CAPSULE | Freq: Two times a day (BID) | ORAL | Status: DC
  Administered 2018-04-29 – 2018-04-30 (×3): 100 mg via ORAL
  Filled 2018-04-29 (×3): qty 1

## 2018-04-29 MED ORDER — PANTOPRAZOLE SODIUM 40 MG PO TBEC
40.0000 mg | DELAYED_RELEASE_TABLET | Freq: Every day | ORAL | Status: DC
  Administered 2018-04-30: 40 mg via ORAL
  Filled 2018-04-29: qty 1

## 2018-04-29 NOTE — Progress Notes (Signed)
PHARMACIST - PHYSICIAN COMMUNICATION  DR:   Gerarda Fraction  CONCERNING: IV to Oral Route Change Policy  RECOMMENDATION: This patient is receiving pantoprazole by the intravenous route.  Based on criteria approved by the Pharmacy and Therapeutics Committee, the intravenous medication(s) is/are being converted to the equivalent oral dose form(s).   DESCRIPTION: These criteria include:  The patient is eating (either orally or via tube) and/or has been taking other orally administered medications for a least 24 hours  The patient has no evidence of active gastrointestinal bleeding or impaired GI absorption (gastrectomy, short bowel, patient on TNA or NPO).  If you have questions about this conversion, please contact the Pharmacy Department  []   [phone] )  [facility] []   [phone] )  [facility] []   [phone] )  [facility] []   [phone] )  [facility] [x]   [phone] )  [place], [facility] 04/29/2018 2:18 PM

## 2018-04-29 NOTE — Progress Notes (Addendum)
4 Days Post-Op Procedure(s) (LRB): HYSTERECTOMY ABDOMINAL WITH BILATERAL SALPINGO-OOPHORECTOMY (Bilateral) OMENTECTOMY (N/A) DEBULKING (N/A)  Subjective: Patient reports wanting [place] this am. No nausea or emesis reported. Ambulating without difficulty.  Passing "some" flatus. No pain reported.  Very appreciative of the heating pad which assists with abdominal discomfort.  She states she feels her abdomen is less distended compared to yesterday. Denies chest pain, dyspnea, having a BM. Daughter, [name], at the bedside.  Objective: Vital signs in last 24 hours: Temp:  [98 F (36.7 C)-99.6 F (37.6 C)] 98 F (36.7 C) (11/11 0613) Pulse Rate:  [87-105] 103 (11/11 0613) Resp:  [17-19] 18 (11/11 0613) BP: (133-164)/(72-103) 133/103 (11/11 0613) SpO2:  [93 %-99 %] 97 % (11/11 0613) Last BM Date: 04/25/18  Intake/Output from previous day: 11/10 0701 - 11/11 0700 In: 1240 [I.V.:825; Blood:315; IV Piggyback:100] Out: 1975 [Urine:1975]  Physical Examination: General: alert, cooperative and no distress Resp: clear to auscultation bilaterally Cardio: regular rate and rhythm, S1, S2 normal, no murmur, click, rub or gallop GI: incision: midline incision with op site dressing in place with no active drainage present and abdomen soft, slightly distended but improved per pt, active bowel sounds, tympanic in upper quadrants Extremities: extremities normal, atraumatic, no cyanosis or edema  Labs: WBC/Hgb/Hct/Plts:  --/10.5/32.7/-- (11/11 0419) BUN/Cr/glu/ALT/AST/amyl/lip:  7/0.51/--/--/--/--/-- (11/11 0419)  Assessment: 84 y.o. s/p Procedure(s): HYSTERECTOMY ABDOMINAL WITH BILATERAL SALPINGO-OOPHORECTOMY OMENTECTOMY DEBULKING: stable Pain:  Pain is well-controlled on PRN medications.  Heme: Hbg 10.5 and Hct 32.7 this am s/p transfusion on 04/28/18.  CV: BP and HR stable post-op. Norvasc ordered. Continue to monitor with vital sign assessments.  GI:  Tolerating po: No: NPO. NG removed  04/28/18. Last abd imaging 04/27/18: IMPRESSION: Persistent air-filled large and small bowel loops without significant dilatation suggesting ileus.  Nasogastric tube with tip in the expected region of the gastroesophageal junction. This could be advanced another 8-10 cm.    GU: Voiding adequate amounts.    FEN: No critical values. Mag 1.8 this am.   Prophylaxis: SCDs and lovenox. To be on lovenox after discharge. Teaching kit in the room.  Plan: Pt given few ice chips to assess tolerability Dr. Phelps to see patient later this am Encourage IS use, deep breathing, coughing, IS use, use of rocking chair, chewing gum, and ambulation Continue plan of care per Dr. Phelps   LOS: 4 days    Melissa D Cross 04/29/2018, 10:05 AM    Patient seen and examined. Agree with above. Advance to full liquids and reassess tomorrow for further planning.  

## 2018-04-30 MED ORDER — HEPARIN SOD (PORK) LOCK FLUSH 100 UNIT/ML IV SOLN
500.0000 [IU] | INTRAVENOUS | Status: AC | PRN
  Administered 2018-04-30: 500 [IU]

## 2018-04-30 MED ORDER — SENNOSIDES-DOCUSATE SODIUM 8.6-50 MG PO TABS: 1.0000 | ORAL_TABLET | Freq: Every day | ORAL | 1 refills | Status: DC

## 2018-04-30 MED ORDER — ENOXAPARIN SODIUM 40 MG/0.4ML ~~LOC~~ SOLN: 40.0000 mg | SUBCUTANEOUS | 0 refills | Status: DC

## 2018-04-30 MED FILL — ENOXAPARIN 40 MG/0.4 ML SYR: 40 | 23 days supply | Qty: 9 | Fill #0

## 2018-04-30 NOTE — Discharge Summary (Signed)
Physician Discharge Summary  Patient ID: Shannon Ellis MRN: [id number] DOB/AGE: 12/18/1931 84 y.o.  Admit date: 04/25/2018 Discharge date: 04/30/2018  Admission Diagnoses: Ovarian cancer [facility])  Discharge Diagnoses:  Principal Problem:   Ovarian cancer [facility])   Discharged Condition:  The patient is in good condition and stable for discharge.    Hospital Course: On 04/25/2018, the patient underwent the following: Procedure(s): HYSTERECTOMY ABDOMINAL WITH BILATERAL SALPINGO-OOPHORECTOMY OMENTECTOMY DEBULKING.   The postoperative course was uneventful except for a post-operative ileus that resolved after decompression with a nasogastric tube.  She was discharged to home on postoperative day 5 tolerating a regular diet, having bowel movements, passing flatus, ambulating without difficulty, voiding, minimal soreness reported.  She is discharged home with home health physical therapy.  Consults: PT  Significant Diagnostic Studies: Abd xray on 04/27/18  Treatments: IV hydration and surgery: see above  Discharge Exam: Blood pressure (!) 150/68, pulse 67, temperature 97.8 F (36.6 C), temperature source Oral, resp. rate 17, height 5\' 6"  (1.676 m), weight 165 lb (74.8 kg), SpO2 96 %. General appearance: alert, cooperative and no distress Resp: clear to auscultation bilaterally Cardio: regular rate and rhythm, S1, S2 normal, no murmur, click, rub or gallop GI: soft, non-tender; bowel sounds normal; no masses,  no organomegaly Extremities: extremities normal, atraumatic, no cyanosis or edema Incision/Wound: Op site dressing removed from the midline incision without difficulty. No drainage present  Disposition: Discharge disposition: 01-Home or Self Care       Discharge Instructions    Call MD for:  difficulty breathing, headache or visual disturbances   Complete by:  As directed    Call MD for:  extreme fatigue   Complete by:  As directed    Call MD for:  hives   Complete by:  As  directed    Call MD for:  persistant dizziness or light-headedness   Complete by:  As directed    Call MD for:  persistant nausea and vomiting   Complete by:  As directed    Call MD for:  redness, tenderness, or signs of infection (pain, swelling, redness, odor or green/yellow discharge around incision site)   Complete by:  As directed    Call MD for:  severe uncontrolled pain   Complete by:  As directed    Call MD for:  temperature >100.4   Complete by:  As directed    Diet - low sodium heart healthy   Complete by:  As directed    Driving Restrictions   Complete by:  As directed    No driving for 2 weeks from surgery.  Do not take narcotics and drive.   Increase activity slowly   Complete by:  As directed    Lifting restrictions   Complete by:  As directed    No lifting greater than 10 lbs.   Sexual Activity Restrictions   Complete by:  As directed    No sexual activity, nothing in the vagina, for 8 weeks from surgery.     Allergies as of 04/30/2018   No Known Allergies     Medication List    TAKE these medications   acetaminophen 500 MG tablet Commonly known as:  TYLENOL Take 500 mg by mouth every 8 (eight) hours as needed for mild pain or headache.   amLODipine 5 MG tablet Commonly known as:  NORVASC Take 5 mg by mouth at bedtime.   enoxaparin 40 MG/0.4ML injection Commonly known as:  LOVENOX Inject 0.4 mLs (40 mg total)  into the skin daily. Start taking on:  05/01/2018   levothyroxine 88 MCG tablet Commonly known as:  SYNTHROID, LEVOTHROID Take 88 mcg by mouth daily before breakfast.   loratadine 10 MG tablet Commonly known as:  CLARITIN Take 10 mg by mouth daily as needed for allergies.   metoprolol succinate 50 MG 24 hr tablet Commonly known as:  TOPROL-XL Take 50 mg by mouth every morning.   multivitamins ther. w/minerals Tabs tablet Take 1 tablet by mouth daily.   NAPHCON-A 0.025-0.3 % ophthalmic solution Generic drug:   naphazoline-pheniramine Place 1 drop into both eyes daily as needed for eye irritation.   olmesartan 40 MG tablet Commonly known as:  BENICAR Take 40 mg by mouth at bedtime.   senna-docusate 8.6-50 MG tablet Commonly known as:  Senokot-S Take 1 tablet by mouth at bedtime. Hold for loose stools            Durable Medical Equipment  (From admission, onward)         Start     Ordered   04/30/18 1450  For home use only DME 3 n 1  Once     04/30/18 1450         Follow-up Information    [name], MD Follow up on 05/15/2018.   Specialty:  Gynecologic Oncology Why:  at 11:45am Contact information: [place] [address] [place], [place] Follow up.   Specialty:  [place] Why:  for home health PT Contact information: [address] [place] [address] [phone]           Greater than thirty minutes were spend for face to face discharge instructions and discharge orders/summary in EPIC.   Signed: Dorothyann Gibbs 04/30/2018, 3:49 PM

## 2018-04-30 NOTE — Progress Notes (Signed)
Pt alert and oriented, tolerating diet.  D/C instructions given, all questions answered. Pt was d.cd home with daughter.

## 2018-04-30 NOTE — Progress Notes (Signed)
Physical Therapy Treatment Patient Details Name: Shannon Ellis MRN: [id number] DOB: February 19, 1932 Today's Date: 04/30/2018    History of Present Illness 84 yo female with ovarian CA was admitted for salpingo-oophorectomy, omentectomy and debulking of tumor and now referred to PT for mobility.  Currently on NG tube for recent vomiting and more comfortable now.  PMHx:  chemotherapy, pelvic mass,     PT Comments    Pt progressing well with mobility, she ambulated 400' with IV pole without loss of balance. PT now recommending HHPT rather than ST-SNF. Pt's daughter will be able to assist pt at home as needed. Recommended pt use a cane at home and have standby assist for stairs and shower transfers, daughter stated she can provide this. Family will privately purchase a shower stool and bedrail.     Follow Up Recommendations  Home health PT     Equipment Recommendations  3in1 (PT)    Recommendations for Other Services       Precautions / Restrictions Precautions Precautions: Fall Precaution Comments: long standing h/o leg length discrepancy 2* R hip fx ~30 years ago Restrictions Weight Bearing Restrictions: No    Mobility  Bed Mobility Overal bed mobility: Modified Independent Bed Mobility: Supine to Sit     Supine to sit: Mod assist;Modified independent (Device/Increase time)     General bed mobility comments: bed flat, pt log rolled, used rail to push up  Transfers Overall transfer level: Needs assistance Equipment used: None Transfers: Sit to/from Stand Sit to Stand: Independent         General transfer comment: steady with no loss of balance  Ambulation/Gait Ambulation/Gait assistance: Supervision Gait Distance (Feet): 400 Feet Assistive device: IV Pole;None Gait Pattern/deviations: Step-through pattern;Decreased stride length Gait velocity: WFL   General Gait Details: pt has long standing h/o limp 2* leg length discrepancy from R hip fx 30 years ago,  ambulated 200' without AD and pt occaissionally reached for handrail in hallway, then ambulated 200' with IV pole with improved steadiness, recommended pt walk with a cane at home for increased support   Stairs             Wheelchair Mobility    Modified Rankin (Stroke Patients Only)       Balance Overall balance assessment: Needs assistance(pt has shorter R leg after hip surgery in last couple years) Sitting-balance support: Feet supported Sitting balance-Leahy Scale: Good     Standing balance support: During functional activity Standing balance-Leahy Scale: Good                              Cognition Arousal/Alertness: Awake/alert Behavior During Therapy: WFL for tasks assessed/performed Overall Cognitive Status: Within Functional Limits for tasks assessed                                        Exercises      General Comments        Pertinent Vitals/Pain Pain Assessment: No/denies pain    Home Living                      Prior Function            PT Goals (current goals can now be found in the care plan section) Acute Rehab PT Goals Patient Stated Goal: none stated PT Goal Formulation: With patient/family  Time For Goal Achievement: 05/11/18 Potential to Achieve Goals: Good Progress towards PT goals: Progressing toward goals    Frequency    Min 3X/week      PT Plan Discharge plan needs to be updated    Co-evaluation              AM-PAC PT "6 Clicks" Daily Activity  Outcome Measure  Difficulty turning over in bed (including adjusting bedclothes, sheets and blankets)?: A Little Difficulty moving from lying on back to sitting on the side of the bed? : A Little Difficulty sitting down on and standing up from a chair with arms (e.g., wheelchair, bedside commode, etc,.)?: A Little Help needed moving to and from a bed to chair (including a wheelchair)?: A Little Help needed walking in hospital room?: A  Little Help needed climbing 3-5 steps with a railing? : A Little 6 Click Score: 18    End of Session Equipment Utilized During Treatment: Gait belt Activity Tolerance: Patient tolerated treatment well Patient left: in chair;with call bell/phone within reach;with family/visitor present Nurse Communication: Mobility status PT Visit Diagnosis: Unsteadiness on feet (R26.81);Muscle weakness (generalized) (M62.81);Difficulty in walking, not elsewhere classified (R26.2)     Time: 6579-0383 PT Time Calculation (min) (ACUTE ONLY): 37 min  Charges:  $Gait Training: 8-22 mins $Therapeutic Activity: 8-22 mins                     Blondell Reveal Kistler PT 04/30/2018  Acute Rehabilitation Services Pager [phone] Office [phone]

## 2018-04-30 NOTE — Progress Notes (Signed)
5 Days Post-Op Procedure(s) (LRB): HYSTERECTOMY ABDOMINAL WITH BILATERAL SALPINGO-OOPHORECTOMY (Bilateral) OMENTECTOMY (N/A) DEBULKING (N/A)  Subjective: Patient reports doing well this am with no nausea or emesis reported. Ambulating without difficulty.  Passing flatus and had episode of loose stool yesterday. Mild soreness reported but "no real pain" reported.  Wanting to try solid food this am.  Reporting moderate diuresis yesterday afternoon with feeling slight dizziness at that time. Denies chest pain, dyspnea. Daughter, [name], at the bedside.  Objective: Vital signs in last 24 hours: Temp:  [97.7 F (36.5 C)-98.4 F (36.9 C)] 97.7 F (36.5 C) (11/12 0930) Pulse Rate:  [64-96] 96 (11/12 0930) Resp:  [15-18] 15 (11/12 0930) BP: (106-171)/(70-105) 106/88 (11/12 0930) SpO2:  [94 %-96 %] 96 % (11/12 0930) Last BM Date: 04/29/18  Intake/Output from previous day: 11/11 0701 - 11/12 0700 In: 1026.1 [P.O.:600; I.V.:426.1] Out: 1550 [Urine:1250; Stool:300]  Physical Examination: General: alert, cooperative and no distress Resp: clear to auscultation bilaterally Cardio: regular rate and rhythm, S1, S2 normal, no murmur, click, rub or gallop GI: incision: midline incision with op site dressing in place with no active drainage present and abdomen soft, active bowel sounds, less tympanic in upper quadrants Extremities: extremities normal, atraumatic, no cyanosis or edema Port a cath accessed to the right chest with fluids at [place]: 84 y.o. s/p Procedure(s): HYSTERECTOMY ABDOMINAL WITH BILATERAL SALPINGO-OOPHORECTOMY OMENTECTOMY DEBULKING: stable Pain:  Pain is well-controlled on PRN medications.  Heme: Hbg 10.5 and Hct 32.7 04/29/18 am s/p transfusion on 04/28/18.  CV: BP and HR stable post-op. Norvasc ordered. Continue to monitor with vital sign assessments.  GI:  Tolerating po: Yes. NG removed 04/28/18. Last abd imaging 04/27/18: IMPRESSION: Persistent air-filled large  and small bowel loops without significant dilatation suggesting ileus.   GU: Voiding adequate amounts.    FEN: No critical values on 04/29/18 [facility]. Mag 1.8 on 04/29/18 am.   Prophylaxis: SCDs and lovenox. To be on lovenox after discharge. Teaching kit in the room.  Plan: Diet advanced to regular. Discussed food choices. Encourage IS use, deep breathing, coughing, IS use, use of rocking chair, chewing gum, and ambulation Continue plan of care per Dr. Delsa Sale Consider possible discharge later today if diet tolerated, no nausea, pain controlled   LOS: 5 days    Dorothyann Gibbs 04/30/2018, 10:34 AM    Patient seen and examined. Agree with above. Advance to full liquids and reassess tomorrow for further planning.

## 2018-04-30 NOTE — Discharge Instructions (Signed)
04/29/2018  Activity: 1. Be up and out of the bed during the day.  Take a nap if needed.  You may walk up steps but be careful and use the hand rail.  Stair climbing will tire you more than you think, you may need to stop part way and rest.   2. No lifting or straining for 6 weeks.  3. No driving for 2 weeks.  Do Not drive if you are taking narcotic pain medicine.  4. Shower daily.  Use soap and water on your incision and pat dry; don't rub.   5. No sexual activity and nothing in the vagina for 6-8 weeks.  Medications:  - Take ibuprofen and tylenol first line for pain control. Take these regularly (every 6 hours) to decrease the build up of pain.  - If necessary, for severe pain not relieved by ibuprofen, call our office.  - You should take sennakot every night to reduce the likelihood of constipation. If this causes diarrhea, stop its use.  Diet: 1. Low sodium Heart Healthy Diet is recommended.  2. It is safe to use a laxative if you have difficulty moving your bowels.   Wound Care: 1. Keep clean and dry.  Shower daily.  Reasons to call the Doctor:   Fever - Oral temperature greater than 100.4 degrees Fahrenheit  Foul-smelling vaginal discharge  Difficulty urinating  Nausea and vomiting  Increased pain at the site of the incision that is unrelieved with pain medicine.  Difficulty breathing with or without chest pain  New calf pain especially if only on one side  Sudden, continuing increased vaginal bleeding with or without clots.   Follow-up: 1. See Precious Haws in 1-2 weeks.  Contacts: For questions or concerns you should contact:  Dr. Precious Haws at [phone]  After hours and on week-ends call [phone] and ask to speak to the physician on call for Gynecologic Oncology   How and Where to Give Subcutaneous Enoxaparin Injections Enoxaparin is an injectable medicine. It is used to help prevent blood clots from developing in your veins. Health care  providers often use anticoagulants like enoxaparin to prevent clots following surgery. Enoxaparin is also used in combination with other medicines to treat blood clots and heart attacks. If blood clots are left untreated, they can be life threatening. Enoxaparin comes in single-use syringes. You inject enoxaparin through a syringe into your belly (abdomen). You should change the injection site each time you give yourself a shot. Continue the enoxaparin injections as directed by your health care provider. Your health care provider will use blood clotting test results to decide when you can safely stop using enoxaparin injections. If your health care provider prescribes any additional medicines, use the medicines exactly as directed. How do I inject enoxaparin? 9. Wash your hands with soap and water. 10. Clean the selected injection site as directed by your health care provider. 11. Remove the needle cap by pulling it straight off the syringe. 12. When using a prefilled syringe, do not push the air bubble out of the syringe before the injection. The air bubble will help you get all of the medicine out of the syringe. 13. Hold the syringe like a pencil using your writing hand. 14. Use your other hand to pinch and hold an inch of the cleansed skin. 15. Insert the entire needle straight down into the fold of skin. 16. Push the plunger with your thumb until the syringe is empty. 17. Pull the needle straight out  of your skin. 18. Enoxaparin injection prefilled syringes and graduated prefilled syringes are available with a system that shields the needle after injection. After you have completed your injection and removed the needle from your skin, firmly push down on the plunger. The protective sleeve will automatically cover the needle and you will hear a click. The click means the needle is safely covered. 19. Place the syringe in the nearest needle box, also called a sharps container. If you do not have a  sharps container, you can use a hard-sided plastic container with a secure lid, such as an empty laundry detergent bottle. What else do I need to know?  Do not use enoxaparin if: ? You have allergies to heparin or pork products. ? You have been diagnosed with a condition called thrombocytopenia.  Do not use the syringe or needle more than one time.  Use medicines only as directed by your health care provider.  Changes in medicines, supplements, diet, and illness can affect your anticoagulation therapy. Be sure to inform your health care provider of any of these changes.  It is important that you tell all of your health care providers and your dentist that you are taking an anticoagulant, especially if you are injured or plan to have any type of procedure.  While on anticoagulants, you will need to have blood tests done routinely as directed by your health care provider.  While using this medicine, avoid physical activities or sports that could result in a fall or cause injury.  Follow up with your laboratory test and health care provider appointments as directed. It is very important to keep your appointments. Not keeping appointments could result in a chronic or permanent injury, pain, or disability.  Before giving your medicine, you should make sure the injection is a clear and colorless or pale yellow solution. If your medicine becomes discolored or if there are particles in the syringe, do not use it and notify your health care provider.  Keep your medicine safely stored at room temperature. Contact a health care provider if:  You develop any rashes on your skin.  You have large areas of bruising on your skin.  You have any worsening of the condition for which you take Enoxaparin.  You develop a fever. Get help right away if:  You develop bleeding problems such as: ? Bleeding from the gums or nose that does not stop quickly. ? Vomiting blood or coughing up blood. ? Blood in  your urine. ? Blood in your stool, or stool that has a dark, tarry, or coffee grounds appearance. ? A cut that does not stop bleeding within 10 minutes. These symptoms may represent a serious problem that is an emergency. Do not wait to see if the symptoms will go away. Get medical help right away. Call your local emergency services (911 in the [place] Do not drive yourself to the hospital. This information is not intended to replace advice given to you by your health care provider. Make sure you discuss any questions you have with your health care provider. Document Released: 04/06/2004 Document Revised: 02/10/2016 Document Reviewed: 11/20/2013 Elsevier Interactive Patient Education  2018 [employer].

## 2018-04-30 NOTE — Care Management (Signed)
DC plans discussed with pt and daughter. Choice offered for [facility]PT and [facility] chosen. [facility] requested as well. RN to get orders for HHPT and [facility]. [facility] rep alerted. Marney Doctor RN,BSN [phone]

## 2018-05-01 DIAGNOSIS — M810 Age-related osteoporosis without current pathological fracture: Secondary | ICD-10-CM | POA: Diagnosis not present

## 2018-05-01 DIAGNOSIS — Z90722 Acquired absence of ovaries, bilateral: Secondary | ICD-10-CM | POA: Diagnosis not present

## 2018-05-01 DIAGNOSIS — Z9221 Personal history of antineoplastic chemotherapy: Secondary | ICD-10-CM | POA: Diagnosis not present

## 2018-05-01 DIAGNOSIS — C562 Malignant neoplasm of left ovary: Secondary | ICD-10-CM | POA: Diagnosis not present

## 2018-05-01 DIAGNOSIS — I10 Essential (primary) hypertension: Secondary | ICD-10-CM | POA: Diagnosis not present

## 2018-05-01 DIAGNOSIS — Z483 Aftercare following surgery for neoplasm: Secondary | ICD-10-CM | POA: Diagnosis not present

## 2018-05-01 DIAGNOSIS — C481 Malignant neoplasm of specified parts of peritoneum: Secondary | ICD-10-CM | POA: Diagnosis not present

## 2018-05-01 DIAGNOSIS — Z90710 Acquired absence of both cervix and uterus: Secondary | ICD-10-CM | POA: Diagnosis not present

## 2018-05-03 ENCOUNTER — Encounter: Payer: Medicare Other | Admitting: Gynecologic Oncology

## 2018-05-03 ENCOUNTER — Telehealth: Payer: Self-pay | Admitting: *Deleted

## 2018-05-03 DIAGNOSIS — C562 Malignant neoplasm of left ovary: Secondary | ICD-10-CM | POA: Diagnosis not present

## 2018-05-03 DIAGNOSIS — Z483 Aftercare following surgery for neoplasm: Secondary | ICD-10-CM | POA: Diagnosis not present

## 2018-05-03 DIAGNOSIS — Z90710 Acquired absence of both cervix and uterus: Secondary | ICD-10-CM | POA: Diagnosis not present

## 2018-05-03 DIAGNOSIS — M810 Age-related osteoporosis without current pathological fracture: Secondary | ICD-10-CM | POA: Diagnosis not present

## 2018-05-03 DIAGNOSIS — C481 Malignant neoplasm of specified parts of peritoneum: Secondary | ICD-10-CM | POA: Diagnosis not present

## 2018-05-03 DIAGNOSIS — Z90722 Acquired absence of ovaries, bilateral: Secondary | ICD-10-CM | POA: Diagnosis not present

## 2018-05-03 NOTE — Telephone Encounter (Signed)
Patient's daughters [name] and [name] called regarding the patient's condition. [name] stated that "Mom has been sleeping a lot the last day and half. She hs no energy all she can do is make it from the bed to the couch. She's not eating solids, has no fever or vomiting. She does state that she is having some nausea. She took a zofran last night. She is stating that she feels terrible." Per Melissa APP schedule the patient to come in to be seen. Spoke with [name] and scheduled the appt for today at 1 pm.

## 2018-05-03 NOTE — Telephone Encounter (Signed)
Spoke wit Ms Fiorello. She states that she is feeling beeter today then yesterday. Bowels are moving more today then yesterday. Pass flatus. It is normal to feel fatigued after a surgery and especially have had chemotherapy prior to surgery.  Told her that Melissa Cross,NP said to just take in full liquids for the next few days to see if that helps with fulness. If patient does not pass gas, have BM, and or develops severe abdominal pain with vomiting she needs to call the office or go to the ED as she could have an ileus as she did in hospital after surgery.  Pt verbalizes understanding.  She is declining the appointment today at 1300 with Joylene John, NP.  Encourage pt to use tylenol alt with Ibuprofen 400 mg q 6 hours. Not to exceed 4000 mg of tylenol in a 24 hr period.

## 2018-05-06 ENCOUNTER — Inpatient Hospital Stay: Payer: Medicare Other

## 2018-05-06 ENCOUNTER — Inpatient Hospital Stay (HOSPITAL_BASED_OUTPATIENT_CLINIC_OR_DEPARTMENT_OTHER): Payer: Medicare Other | Admitting: Hematology and Oncology

## 2018-05-06 ENCOUNTER — Encounter: Payer: Self-pay | Admitting: Hematology and Oncology

## 2018-05-06 ENCOUNTER — Telehealth: Payer: Self-pay | Admitting: Hematology and Oncology

## 2018-05-06 VITALS — BP 146/73 | HR 70 | Temp 97.6°F | Resp 18 | Ht 66.0 in | Wt 159.2 lb

## 2018-05-06 DIAGNOSIS — C562 Malignant neoplasm of left ovary: Secondary | ICD-10-CM

## 2018-05-06 DIAGNOSIS — Z90722 Acquired absence of ovaries, bilateral: Secondary | ICD-10-CM

## 2018-05-06 DIAGNOSIS — Z9221 Personal history of antineoplastic chemotherapy: Secondary | ICD-10-CM | POA: Diagnosis not present

## 2018-05-06 DIAGNOSIS — C786 Secondary malignant neoplasm of retroperitoneum and peritoneum: Secondary | ICD-10-CM | POA: Diagnosis not present

## 2018-05-06 DIAGNOSIS — C569 Malignant neoplasm of unspecified ovary: Secondary | ICD-10-CM | POA: Diagnosis not present

## 2018-05-06 LAB — COMPREHENSIVE METABOLIC PANEL
ALK PHOS: 100 U/L (ref 38–126)
ALT: 41 U/L (ref 0–44)
AST: 26 U/L (ref 15–41)
Albumin: 3.2 g/dL — ABNORMAL LOW (ref 3.5–5.0)
Anion gap: 7 (ref 5–15)
BUN: 8 mg/dL (ref 8–23)
CALCIUM: 9.5 mg/dL (ref 8.9–10.3)
CO2: 29 mmol/L (ref 22–32)
CREATININE: 0.72 mg/dL (ref 0.44–1.00)
Chloride: 103 mmol/L (ref 98–111)
Glucose, Bld: 86 mg/dL (ref 70–99)
Potassium: 4.2 mmol/L (ref 3.5–5.1)
Sodium: 139 mmol/L (ref 135–145)
Total Bilirubin: 0.3 mg/dL (ref 0.3–1.2)
Total Protein: 7 g/dL (ref 6.5–8.1)

## 2018-05-06 LAB — CBC WITH DIFFERENTIAL (CANCER CENTER ONLY)
ABS IMMATURE GRANULOCYTES: 0.17 10*3/uL — AB (ref 0.00–0.07)
Basophils Absolute: 0.1 10*3/uL (ref 0.0–0.1)
Basophils Relative: 1 %
Eosinophils Absolute: 0.2 10*3/uL (ref 0.0–0.5)
Eosinophils Relative: 3 %
HEMATOCRIT: 35.8 % — AB (ref 36.0–46.0)
Hemoglobin: 11.2 g/dL — ABNORMAL LOW (ref 12.0–15.0)
Immature Granulocytes: 2 %
LYMPHS ABS: 1.3 10*3/uL (ref 0.7–4.0)
LYMPHS PCT: 17 %
MCH: 28.4 pg (ref 26.0–34.0)
MCHC: 31.3 g/dL (ref 30.0–36.0)
MCV: 90.6 fL (ref 80.0–100.0)
MONO ABS: 0.7 10*3/uL (ref 0.1–1.0)
Monocytes Relative: 10 %
NEUTROS ABS: 5.2 10*3/uL (ref 1.7–7.7)
Neutrophils Relative %: 67 %
Platelet Count: 446 10*3/uL — ABNORMAL HIGH (ref 150–400)
RBC: 3.95 MIL/uL (ref 3.87–5.11)
RDW: 16.2 % — ABNORMAL HIGH (ref 11.5–15.5)
WBC Count: 7.6 10*3/uL (ref 4.0–10.5)
nRBC: 0 % (ref 0.0–0.2)

## 2018-05-06 LAB — PHOSPHORUS: Phosphorus: 3.3 mg/dL (ref 2.5–4.6)

## 2018-05-06 LAB — MAGNESIUM: MAGNESIUM: 1.9 mg/dL (ref 1.7–2.4)

## 2018-05-06 NOTE — Progress Notes (Signed)
Hematology/Oncology Outpatient Progress Note  Patient Care Team: Marton Redwood, [name]  ASSESSMENT & PLAN:  Ovarian cancer [facility]) Locally advanced ovarian carcinoma; status post exploratory laparotomy; total abdominal hysterectomy and bilateral salpingo-oophorectomy with omentectomy under the direction of Dr. Precious Haws, gynecologic oncology on April 25, 2018.  -Large left sided ovarian tumor wedged retroperitoneal and inferiorly into pelvis with some adherence to sigmoid mesentary. Some tumor nodules on surface of sigmoid epiploica that was removed. ~1-2 cm tumor implant lateral to cecum also excised. No obvious omental disease. This represented an optimal cytoreduction (R0) with no gross visible disease remaining. No carcinomatosis. Diaphragms and small bowel /mesentary all free of disease.  -The results of the surgical histopathology revealed a high-grade carcinoma spanning 13.3 cm, presumed from the ovarian surface involvement. No definitive tumor was identified within the fallopian tube on the left.  The left fallopian tube was not grossly or microscopically identified and thus involvement could not be assessed.  Within the endometrium was in active endometrium without hyperplasia or malignancy. Endocervix was benign squamous and endocervical mucosa. No dysplasia or malignancy was identified. The right fallopian tube was involved with metastatic carcinoma.  There was a soft tissue sigmoid implant with metastatic carcinoma.  The omentum contained metastatic carcinoma.  There was a soft tissue biopsy and cecal implant that was positive for metastatic carcinoma.  -We discussed the results of her CT imaging of the chest, abdomen and pelvis from October 23 prior to surgery.  Those results are detailed below.  -We discussed also in lengthy detail the results of her surgical resection and histopathology.  Questions were answered to their satisfaction.  Her  midline incision has healed.  -On March 29, 2018, under the direction of Dr. Alvy Bimler, she received cycle 3 of paclitaxel and carboplatin.  She tolerated that therapy without significant difficulty.  -She continues to complain of constant numbness of her fingertips and intermittent numbness of her toes due to chemotherapy-induced peripheral neuropathy.  -It was recommended that she hold the stool softeners, senna S and MiraLAX, until her stool firms up.  If necessary, restart senna S: 1-2 tablets twice daily as needed.  She was advised to call if ineffective.  -The results of her laboratory studies were not available at the time of discharge.  They will be discussed in detail the time of her next visit.  Those results are detailed below.  -Barring any unforeseen complications, her next scheduled doctor visit with laboratory studies is on December 2.  If medically fit and cleared surgically, continued treatment will be restarted on December 4 (cycle 4 paclitaxel and carboplatin).  -She was advised to call us in the interim should any new or untoward problems arise.  INTERVAL HISTORY: She returns with daughter for further follow-up Since her surgery and hospital discharge on November 12, her appetite is poor.   She has lost 4 pounds. No nausea or vomiting. Pasty, loose stool numbering 3-4 times daily on MiraLAX and senna S daily. No infectious or bleeding complications No pain or narcotic analgesics. She is ambulating without difficulty. Energy level low; tired and sleepy. Scheduled to see Dr. Gerarda Fraction on November 27. She continues to have peripheral neuropathy with numbness affecting the tips of fingers and toes but not worse  SUMMARY OF ONCOLOGIC HISTORY:   Ovarian cancer ([place])   01/02/2018 Imaging    1. Large complex left paramidline pelvic mass likely rises from the left ovary. Together with left periaortic adenopathy and peritoneal carcinomatosis,  findings are worrisome for metastatic  ovarian cancer. 2. Small pelvic free fluid. 3.  Aortic atherosclerosis (ICD10-170.0).    01/21/2018 Pathology Results    Omentum, biopsy, dominant omental nodule within the right lower abdominal quadrant - POORLY DIFFERENTIATED CARCINOMA CONSISTENT WITH GYNECOLOGIC PRIMARY. - SEE MICROSCOPIC DESCRIPTION. Microscopic Comment The tumor is characterized by sheets of tumor cells with enlarged nuclei, many containing prominent nucleoli. There are patchy areas with eosinophilic cytoplasmic inclusions. Immunohistochemistry shows positivity with cytokeratin AE1/AE3, cytokeratin 7, estrogen receptor, progesterone receptor, PAX-8, WT-1, and placental alkaline phosphatase (PLAP). The tumor is negative with alpha fetoprotein, human chorionic gonadotropin, CD117, CD10, CD30, CD56, CDX-2, CEA, cytokeratin 20, S100, SOX-10, synaptophysin, chromogranin, D2-40, GATA-3, GCDFP, Glypican 3 and Inhibin. 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 The estrogen receptor is 100% with strong staining and the progesterone receptor is 90% with strong staining.    01/21/2018 Procedure    Technically successful CT guided core needle biopsy of dominant omental nodule within the right lower abdominal quadrant, adjacent to the cecum.    01/24/2018 Cancer Staging    Staging form: Ovary, Fallopian Tube, and Primary Peritoneal Carcinoma, AJCC 8th Edition - Clinical: Stage IIIC (cT3c, cN1b, cM0) - Signed by [name], MD on 01/24/2018    01/25/2018 Tumor Marker    Patient's tumor was tested for the following markers: AFP Results of the tumor marker test revealed 4.7    01/31/2018 Surgery    Pre-operative Diagnosis: Ovarian cancer NOS  Post-operative Diagnosis: At least Stage 3 ovarian cancer   Operation: Laparoscopic omental biopsy  Surgeon: Mart Piggs, MD  Specimens: Omentum  Operative Findings: Omental nodule >2cm. Large left pelvic mass 12-15cm with  sigmoid draped medially and potentially adherent to colon. The mass appeared to have a 3cm cystic area that was ruptured preoperatively. Uterus appears normal with small anterior fibroid ~1cm. Right adnexa nonenlarged. Small bowel without evidence of disease. No diaphragmatic disease.     01/31/2018 Pathology Results    Omentum, resection for tumor - HIGH GRADE CARCINOMA. - SEE MICROSCOPIC DESCRIPTION. Microscopic Comment This case is sent to Dr. Annitta Jersey for consultation. The case was discussed with Dr. Gerarda Fraction on 02/01/18. (JDP:kh 02/01/18) The omentum shows nodules of high grade carcinoma characterized by diffuse sheets of malignant cells with enlarged nuclei with prominent nucleoli. There is moderate to abundant eosinophilic cytoplasm and frequent mitotic figures. 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 The entire specimen is examined histologically and there are nodules of tumor throughout the specimen with identical morphology. Additional immunohistochemistry for hepatoid markers will be performed and reported as an addendum    02/11/2018 Procedure    Status post right IJ port catheter placement. Catheter ready for use.    02/14/2018 Tumor Marker    Patient's tumor was tested for the following markers: CA-125 Results of the tumor marker test revealed 1523    02/15/2018 -  Chemotherapy    The patient had carboplatin and taxol. Taxol dose reduced from cycle 2 onwards    02/23/2018 Imaging    Interval development of abdominal ascites.  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  No significant change in multilobulated complex left pelvic mass, which compresses the pelvic structures  and displaces  them to the right.  Interval development of mild right hydronephrosis and hydroureter, possibly postobstructive.    03/08/2018 Tumor Marker    Patient's tumor was tested for the following markers: CA-125 Results of the tumor marker test revealed 1844    03/29/2018 Tumor Marker    Patient's tumor was tested for the following markers: CA-125 Results of the tumor marker test revealed 1141    04/10/2018 Imaging    1. New omental nodule. Otherwise, left ovarian mass and right paracolic gutter nodule have decreased in size in the interval. 2. Small ascites, increased. 3. Endometrial cavity may be dilated and contains fluid. Please correlate clinically. 4. Mild prominence of the ureters bilaterally, possibly due to large left adnexal mass. 5. Aortic atherosclerosis (ICD10-170.0). Coronary artery calcification.       04/25/2018                   Surgical Histopathology/Diagnosis                                        1. Adnexa - ovary +/- tube, neoplastic, left                                         - HIGH GRADE CARCINOMA, SPANNING 13.3 CM.                                         - PRESUMED OVARIAN SURFACE INVOLVEMENT.                                         - DEFINITIVE FALLOPIAN TUBE NOT IDENTIFIED.                                         - SEE COMMENT AND ONCOLOGY TABLE.                                        2. Uterus and cervix, right tube and ovary                                         - UTERUS:                                         -ENDOMETRIUM: INACTIVE ENDOMETRIUM.                                           NO HYPERPLASIA OR MALIGNANCY.                                         -  MYOMETRIUM: LEIOMYOMA. NO MALIGNANCY.                                         -SEROSA: UNREMARKABLE. NO MALIGNANCY.                                         - CERVIX: BENIGN SQUAMOUS AND ENDOCERVICAL                                          MUCOSA. NO DYSPLASIA OR MALIGNANCY.                                          - RIGHT OVARY: UNREMARKABLE. NO MALIGNANCY.                                         - RIGHT FALLOPIAN TUBES: METASTATIC                                            CARCINOMA.                                        3. Soft tissue, biopsy, sigmoid implant                                         - METASTATIC CARCINOMA.                                        4. Omentum, resection for tumor                                         - METASTATIC CARCINOMA.                                        5. Soft tissue, biopsy, pelvic cecal implant                                         - METASTATIC CARCINOMA.  REVIEW OF SYSTEMS:   Constitutional: Denies fevers, chills; 4 pound weight loss. Eyes: Denies blurriness of vision Ears, nose, mouth, throat, and face: Denies mucositis or sore throat Respiratory: Denies cough, dyspnea or wheezes Cardiovascular: Denies palpitation, chest discomfort or lower extremity swelling Gastrointestinal: 3-4 pasty/loose stools daily while on MiraLAX and senna S Skin: Denies abnormal skin  rashes Lymphatics: Denies new lymphadenopathy or easy bruising Behavioral/Psych: Mood is stable, no new changes  All other systems were reviewed with the patient and are negative.  Past Medical History Reviewed        Family History Reviewed        Social History Reviewed  ALLERGIES:  has No Known Allergies.  MEDICATIONS:  Current Outpatient Medications  Medication Sig Dispense Refill  . acetaminophen (TYLENOL) 500 MG tablet Take 500 mg by mouth every 8 (eight) hours as needed for mild pain or headache.     Marland Kitchen amLODipine (NORVASC) 5 MG tablet Take 5 mg by mouth at bedtime.     . enoxaparin (LOVENOX) 40 MG/0.4ML injection Inject 0.4 mLs (40 mg total) into the skin daily. 23 Syringe 0  . levothyroxine (SYNTHROID, LEVOTHROID) 88 MCG tablet Take 88 mcg by mouth daily before breakfast.    . loratadine (CLARITIN) 10 MG tablet Take 10 mg by mouth daily as needed for allergies.     .  metoprolol succinate (TOPROL-XL) 50 MG 24 hr tablet Take 50 mg by mouth every morning.     . Multiple Vitamins-Minerals (MULTIVITAMINS THER. W/MINERALS) TABS Take 1 tablet by mouth daily.      . naphazoline-pheniramine (NAPHCON-A) 0.025-0.3 % ophthalmic solution Place 1 drop into both eyes daily as needed for eye irritation.    Marland Kitchen olmesartan (BENICAR) 40 MG tablet Take 40 mg by mouth at bedtime.     . senna-docusate (SENOKOT-S) 8.6-50 MG tablet Take 1 tablet by mouth at bedtime. Hold for loose stools 30 tablet 1   No current facility-administered medications for this visit.    PHYSICAL EXAMINATION: ECOG PERFORMANCE STATUS: 1 - Symptomatic but completely ambulatory Vitals:   05/06/18 0859  BP: (!) 146/73  Pulse: 70  Resp: 18  Temp: 97.6 F (36.4 C)  SpO2: 99%   Filed Weights   05/06/18 0859  Weight: 159 lb 3.2 oz (72.2 kg)  GENERAL:alert, no distress and comfortable SKIN: skin color, texture, turgor are normal, no rashes or significant lesions EYES: normal, Conjunctiva are pink and non-injected, sclera clear OROPHARYNX:no exudate, no erythema and lips, buccal mucosa, and tongue normal  NECK: supple, thyroid normal size, non-tender, without nodularity LYMPH:  no palpable lymphadenopathy in the cervical, axillary or inguinal LUNGS: clear to auscultation and percussion with normal breathing effort HEART: regular rate & rhythm and no murmurs and no lower extremity edema ABDOMEN:abdomen soft, palpable pelvic mass arising near the left lower quadrant.  No abdominal ascites.  No tenderness on palpation; intact midline surgical incision. Musculoskeletal:no cyanosis of digits and no clubbing  NEURO: alert & oriented x 3 with fluent speech, no focal motor/sensory deficits  LABORATORY DATA:  I have reviewed the data as listed May 06, 2018  Ref Range & Units 10:21 7d ago 8d ago 9d ago 10d ago  WBC Count 4.0 - 10.5 K/uL 7.6   8.3     RBC 3.87 - 5.11 MIL/uL 3.95   2.55Low      Hemoglobin  12.0 - 15.0 g/dL 11.2Low   10.5Low  CM 7.3Low   8.4Low   8.1Low    HCT 36.0 - 46.0 % 35.8Low   32.7Low  CM 23.5Low   26.8Low  CM 25.8Low  CM  MCV 80.0 - 100.0 fL 90.6   92.2     MCH 26.0 - 34.0 pg 28.4   28.6     MCHC 30.0 - 36.0 g/dL 31.3   31.1     RDW 11.5 -  15.5 % 16.2High    16.8High      Platelet Count 150 - 400 K/uL 446High    217     nRBC 0.0 - 0.2 % 0.0   0.0 CM    Neutrophils Relative % % 67       Neutro Abs 1.7 - 7.7 K/uL 5.2       Lymphocytes Relative % 17       Lymphs Abs 0.7 - 4.0 K/uL 1.3       Monocytes Relative % 10       Monocytes Absolute 0.1 - 1.0 K/uL 0.7       Eosinophils Relative % 3       Eosinophils Absolute 0.0 - 0.5 K/uL 0.2       Basophils Relative % 1       Basophils Absolute 0.0 - 0.1 K/uL 0.1       Immature Granulocytes % 2       Abs Immature Granulocytes 0.00 - 0.07 K/uL 0.17High          Ref Range & Units 10:21 7d ago 8d ago 9d ago 10d ago  Sodium 135 - 145 mmol/L 139  139  133Low   130Low   134Low    Potassium 3.5 - 5.1 mmol/L 4.2  3.5  3.6  3.9  4.4   Chloride 98 - 111 mmol/L 103  105  103  102  104   CO2 22 - 32 mmol/L _0 Glucose, Bld 70 - 99 mg/dL 86  110High   118High   152High   195High    BUN 8 - 23 mg/dL 8  7Low   _1 Creatinine, Ser 0.44 - 1.00 mg/dL 0.72  0.51  0.48  0.63  0.79   Calcium 8.9 - 10.3 mg/dL 9.5  8.6Low   7.8Low   8.2Low   8.6Low    Total Protein 6.5 - 8.1 g/dL 7.0   5.5Low      Albumin 3.5 - 5.0 g/dL 3.2Low    2.6Low      AST 15 - 41 U/L 26   19     ALT 0 - 44 U/L 41   18     Alkaline Phosphatase 38 - 126 U/L 100   78     Total Bilirubin 0.3 - 1.2 mg/dL 0.3   0.7     GFR calc non Af Amer >60 mL/min >60  >60  >60  >60  >60   GFR calc Af Amer >60 mL/min >60  >60 CM >60 CM >60 CM >60 CM  Comment: (NOTE)   Magnesium 1.9 Phosphorus 3.3     Component Value Date/Time   NA 139 04/29/2018 0419   K 3.5 04/29/2018 0419   CL 105 04/29/2018 0419   CO2 27 04/29/2018 0419   GLUCOSE 110 (H) 04/29/2018  0419   BUN 7 (L) 04/29/2018 0419   CREATININE 0.51 04/29/2018 0419   CALCIUM 8.6 (L) 04/29/2018 0419   PROT 5.5 (L) 04/28/2018 0411   ALBUMIN 2.6 (L) 04/28/2018 0411   AST 19 04/28/2018 0411   ALT 18 04/28/2018 0411   ALKPHOS 78 04/28/2018 0411   BILITOT 0.7 04/28/2018 0411   GFRNONAA >60 04/29/2018 0419   GFRAA >60 04/29/2018 0419    No results found for: SPEP, UPEP  Lab Results  Component Value Date   WBC 8.3 04/28/2018   NEUTROABS 5.3  04/24/2018   HGB 10.5 (L) 04/29/2018   HCT 32.7 (L) 04/29/2018   MCV 92.2 04/28/2018   PLT 217 04/28/2018      Chemistry      Component Value Date/Time   NA 139 04/29/2018 0419   K 3.5 04/29/2018 0419   CL 105 04/29/2018 0419   CO2 27 04/29/2018 0419   BUN 7 (L) 04/29/2018 0419   CREATININE 0.51 04/29/2018 0419      Component Value Date/Time   CALCIUM 8.6 (L) 04/29/2018 0419   ALKPHOS 78 04/28/2018 0411   AST 19 04/28/2018 0411   ALT 18 04/28/2018 0411   BILITOT 0.7 04/28/2018 0411     Imaging Studies: April 10, 2018 CT CHEST, ABDOMEN, AND PELVIS WITH CONTRAST  TECHNIQUE: Multidetector CT imaging of the chest, abdomen and pelvis was performed following the standard protocol during bolus administration of intravenous contrast.  CONTRAST:  132m OMNIPAQUE IOHEXOL 300 MG/ML  SOLN  COMPARISON:  CT abdomen pelvis 02/23/2018 and 02/19/2013. CT chest 11/19/2010.  FINDINGS: CT CHEST FINDINGS  Cardiovascular: Right IJ Port-A-Cath terminates in the low SVC. Atherosclerotic calcification of the arterial vasculature, including coronary arteries and aortic valve. Heart is at the upper limits of normal in size to mildly enlarged. No pericardial effusion.  Mediastinum/Nodes: Mediastinal lymph nodes are not enlarged by CT size criteria. No hilar or axillary lymph nodes. Esophagus is grossly unremarkable.  Lungs/Pleura: Biapical pleuroparenchymal scarring. 2 mm peripheral lingular nodule (series 7, image 94), unchanged  from 11/19/2010 and considered benign. 3 mm left lower lobe nodule (series 7, image 148), likely stable from 02/19/2013, given differences in slice thickness. Lungs are otherwise clear. No pleural fluid. Airway is unremarkable.  Musculoskeletal: Degenerative changes in the spine. No worrisome lytic or sclerotic lesions.  CT ABDOMEN PELVIS FINDINGS  Hepatobiliary: Subcentimeter low-attenuation lesion in the periphery of the right hepatic lobe is too small to characterize. Liver and gallbladder are otherwise unremarkable. No biliary ductal dilatation.  Pancreas: Negative.  Spleen: Negative.  Adrenals/Urinary Tract: Adrenal glands are unremarkable. Subcentimeter low-attenuation lesions in the kidneys are too small to characterize but statistically, cysts are most likely. Ureters may be minimally prominent. Bladder is displaced to the right due to mass effect from adjacent mass.  Stomach/Bowel: Small hiatal hernia. Stomach is decompressed. Stomach, small bowel and colon are unremarkable. Appendix is not readily visualized.  Vascular/Lymphatic: Atherosclerotic calcification of the arterial vasculature without abdominal aortic aneurysm. No pathologically enlarged lymph nodes.  Reproductive: Uterus is visualized. The endometrial cavity may be dilated and contains fluid. There is a significant amount of streak artifact in this area from a right hip arthroplasty, however. Complex left adnexal mass measures 7.6 x 12.9 cm, previously 8.4 x 15.2 cm. It exerts mass effect on the uterus and bladder.  Other: Small scattered ascites, slightly increased. Right paracolic peritoneal nodule measures 1.8 cm (series 2, image 94), previously 2.3 cm. Left paramidline omental nodule measures 1.3 cm (image 74) and is new. Tiny periumbilical hernia contains fluid.  Musculoskeletal: Right hip arthroplasty. Degenerative changes in the spine. No worrisome lytic or sclerotic  lesions.  IMPRESSION: 1. New omental nodule. Otherwise, left ovarian mass and right paracolic gutter nodule have decreased in size in the interval. 2. Small ascites, increased. 3. Endometrial cavity may be dilated and contains fluid. Please correlate clinically. 4. Mild prominence of the ureters bilaterally, possibly due to large left adnexal mass. 5. Aortic atherosclerosis (ICD10-170.0). Coronary artery calcification.  MLorin PicketM.D. 04/10/2018 14:05  The total time spent  discussing the her recent surgery, surgical histopathology,, physical examination, role and rationale for continued treatment, adjustment of stool softeners and recommendations was 40 minutes. At least 50% of that time was spent in face to face discussion, counseling, and answering questions. There was ample time allotted to answer all questions.  This note was dictated using voice activated technology/software.  Unfortunately, typographical errors are not uncommon, and transcription is subject to mistakes and regrettably misinterpretation.  If necessary, clarification of the above information can be discussed with me at any time.  FOLLOW UP: AS DIRECTED   cc:    [name] MD          Precious Haws, MD    Henreitta Leber, MD  Hematology/Oncology [facility] [address] [place], [place] [address] Office: [phone] EMVV: [phone]

## 2018-05-06 NOTE — Telephone Encounter (Signed)
Gave pt avs and calendar  °

## 2018-05-06 NOTE — Patient Instructions (Addendum)
He discussed the surgical procedure and the histopathology from November 7.  It is recommended that you hold senna S and MiraLAX temporarily until bowel movements firm up pain.  Restart senna as needed to maintain a daily bowel.  Please call if ineffective.  Laboratory studies will be obtained today.  You can call for those results within 24 hours unless notified sooner.  Leave your name and telephone number and I will call you back.  A follow-up appointment will be scheduled on December 2 with lab work before the visit.  A tentative appointment has been scheduled for chemotherapy on December 4.  Please do not hesitate to call in the interim should any new or untoward problems arise.  Thank you! Happy Thanksgiving! Ladona Ridgel, MD Hematology/Oncology

## 2018-05-07 DIAGNOSIS — Z90710 Acquired absence of both cervix and uterus: Secondary | ICD-10-CM | POA: Diagnosis not present

## 2018-05-07 DIAGNOSIS — Z90722 Acquired absence of ovaries, bilateral: Secondary | ICD-10-CM | POA: Diagnosis not present

## 2018-05-07 DIAGNOSIS — Z483 Aftercare following surgery for neoplasm: Secondary | ICD-10-CM | POA: Diagnosis not present

## 2018-05-07 DIAGNOSIS — M810 Age-related osteoporosis without current pathological fracture: Secondary | ICD-10-CM | POA: Diagnosis not present

## 2018-05-07 DIAGNOSIS — C562 Malignant neoplasm of left ovary: Secondary | ICD-10-CM | POA: Diagnosis not present

## 2018-05-07 DIAGNOSIS — C481 Malignant neoplasm of specified parts of peritoneum: Secondary | ICD-10-CM | POA: Diagnosis not present

## 2018-05-07 LAB — CA 125: Cancer Antigen (CA) 125: 190 U/mL — ABNORMAL HIGH (ref 0.0–38.1)

## 2018-05-10 DIAGNOSIS — Z90710 Acquired absence of both cervix and uterus: Secondary | ICD-10-CM | POA: Diagnosis not present

## 2018-05-10 DIAGNOSIS — C481 Malignant neoplasm of specified parts of peritoneum: Secondary | ICD-10-CM | POA: Diagnosis not present

## 2018-05-10 DIAGNOSIS — Z483 Aftercare following surgery for neoplasm: Secondary | ICD-10-CM | POA: Diagnosis not present

## 2018-05-10 DIAGNOSIS — Z90722 Acquired absence of ovaries, bilateral: Secondary | ICD-10-CM | POA: Diagnosis not present

## 2018-05-10 DIAGNOSIS — M810 Age-related osteoporosis without current pathological fracture: Secondary | ICD-10-CM | POA: Diagnosis not present

## 2018-05-10 DIAGNOSIS — C562 Malignant neoplasm of left ovary: Secondary | ICD-10-CM | POA: Diagnosis not present

## 2018-05-13 DIAGNOSIS — Z90710 Acquired absence of both cervix and uterus: Secondary | ICD-10-CM | POA: Diagnosis not present

## 2018-05-13 DIAGNOSIS — C562 Malignant neoplasm of left ovary: Secondary | ICD-10-CM | POA: Diagnosis not present

## 2018-05-13 DIAGNOSIS — M810 Age-related osteoporosis without current pathological fracture: Secondary | ICD-10-CM | POA: Diagnosis not present

## 2018-05-13 DIAGNOSIS — C481 Malignant neoplasm of specified parts of peritoneum: Secondary | ICD-10-CM | POA: Diagnosis not present

## 2018-05-13 DIAGNOSIS — Z90722 Acquired absence of ovaries, bilateral: Secondary | ICD-10-CM | POA: Diagnosis not present

## 2018-05-13 DIAGNOSIS — Z483 Aftercare following surgery for neoplasm: Secondary | ICD-10-CM | POA: Diagnosis not present

## 2018-05-14 NOTE — Progress Notes (Signed)
S: Being seen today for an early postop check. On 04/25/18 underwent Interval Debulking with ELAP/TAH/BSO with radical tumor debulking for ovarian cancer/omentectomy  Findings as follows: Large left sided ovarian tumor wedged retroperitoneal and inferiorly into pelvis with some adherence to sigmoid mesentary. Some tumor nodules on surface of sigmoid epiploica that was removed. ~1-2 cm tumor implant lateral to cecum also excised. No obvious omental disease. This represented an optimal cytoreduction (R0) with no gross visible disease remaining. No carcinomatosis. Diaphragms and small bowel /mesentary all free of disease. Pathology as follows: 1. 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 - PRESUMED OVARIAN SURFACE INVOLVEMENT. - DEFINITIVE FALLOPIAN TUBE NOT IDENTIFIED. - SEE COMMENT AND ONCOLOGY TABLE. 2. Uterus and cervix, right tube and ovary - UTERUS: -ENDOMETRIUM: INACTIVE ENDOMETRIUM. NO HYPERPLASIA OR MALIGNANCY. -MYOMETRIUM: LEIOMYOMA. NO MALIGNANCY. -SEROSA: UNREMARKABLE. NO MALIGNANCY. - CERVIX: BENIGN SQUAMOUS AND ENDOCERVICAL MUCOSA. NO DYSPLASIA OR MALIGNANCY. - RIGHT OVARY: UNREMARKABLE. NO MALIGNANCY. - RIGHT FALLOPIAN TUBES: METASTATIC CARCINOMA. 3. Soft tissue, biopsy, sigmoid implant - METASTATIC CARCINOMA. 4. Omentum, resection for tumor - METASTATIC CARCINOMA. 5. Soft tissue, biopsy, pelvic cecal implant - METASTATIC CARCINOMA. Comment(s): Residual ovarian parenchyma is not identified and the entire mass consists of tumor, thus, surface involvement is presumably present. The left fallopian tube is not grossly or microscopically identified and thus involvement can not be assessed. A prior omental biopsy was sent to Dr. Annitta Jersey for consultation and was diagnosed as high grade carcinoma with hepatoid features. The current tumor is compared to the biopsy and looks morphologically identical.  Initial postop course notable for  ileus that resolved spontaneously. No bowel/bladder complaints. Mild Grade 1 neuropathy. Denies fever, denies pain.  O:  Vitals:   05/15/18 1153  BP: 140/90  Pulse: 65  Resp: 16  Temp: (!) 97.5 F (36.4 C)  SpO2: 98%   General :  Well developed, 84 y.o., female in no apparent distress HEENT:  Normocephalic/atraumatic, symmetric, EOMI, eyelids normal Neck:   No visible masses.  Respiratory:  Respirations unlabored, no use of accessory muscles CV:   Deferred Breast:  Deferred Musculoskeletal: Normal muscle strength. Abdomen:  Wound CDI No visible masses or protrusion Extremities:  No visible edema or deformities Skin:   Normal inspection Neuro/Psych:  No focal motor deficit, no abnormal mental status. Normal gait. Normal affect. Alert and oriented to person, place, and time    A/P Ovarian CA S/p 3 cycles chemo followed by interval debulking  She is ~20 days postop today  Cleared to restart chemo next week (ie first week December)  I would continue dosing as is. Her neuropathy sounds stable and she's already dose-reduced. RTC for vaginal cuff check in 4-6 weeks.  Cc: Marton Redwood, MD (Referring PCP) Ladona Ridgel, MD for [name], MD (Medical Oncology)

## 2018-05-15 ENCOUNTER — Inpatient Hospital Stay (HOSPITAL_BASED_OUTPATIENT_CLINIC_OR_DEPARTMENT_OTHER): Payer: Medicare Other | Admitting: Obstetrics

## 2018-05-15 ENCOUNTER — Encounter: Payer: Self-pay | Admitting: Obstetrics

## 2018-05-15 VITALS — BP 140/90 | HR 65 | Temp 97.5°F | Resp 16 | Ht 66.0 in | Wt 156.4 lb

## 2018-05-15 DIAGNOSIS — C569 Malignant neoplasm of unspecified ovary: Secondary | ICD-10-CM

## 2018-05-15 DIAGNOSIS — Z90722 Acquired absence of ovaries, bilateral: Secondary | ICD-10-CM

## 2018-05-15 DIAGNOSIS — Z90710 Acquired absence of both cervix and uterus: Secondary | ICD-10-CM

## 2018-05-15 NOTE — Patient Instructions (Signed)
Return in one month for a pelvic Still no lifting, pulling, pushing > 5 pounds until you are cleared after visit above. Keep chemo appointment to restart 1st week December

## 2018-05-19 ENCOUNTER — Other Ambulatory Visit: Payer: Self-pay | Admitting: Hematology and Oncology

## 2018-05-19 DIAGNOSIS — C562 Malignant neoplasm of left ovary: Secondary | ICD-10-CM

## 2018-05-19 DIAGNOSIS — Z5111 Encounter for antineoplastic chemotherapy: Secondary | ICD-10-CM

## 2018-05-20 ENCOUNTER — Encounter: Payer: Self-pay | Admitting: Hematology and Oncology

## 2018-05-20 ENCOUNTER — Inpatient Hospital Stay: Payer: Medicare Other | Attending: Obstetrics

## 2018-05-20 ENCOUNTER — Inpatient Hospital Stay (HOSPITAL_BASED_OUTPATIENT_CLINIC_OR_DEPARTMENT_OTHER): Payer: Medicare Other | Admitting: Hematology and Oncology

## 2018-05-20 VITALS — BP 146/90 | HR 98 | Temp 97.5°F | Resp 17 | Ht 66.0 in | Wt 157.3 lb

## 2018-05-20 DIAGNOSIS — H6501 Acute serous otitis media, right ear: Secondary | ICD-10-CM | POA: Insufficient documentation

## 2018-05-20 DIAGNOSIS — T451X5A Adverse effect of antineoplastic and immunosuppressive drugs, initial encounter: Secondary | ICD-10-CM | POA: Insufficient documentation

## 2018-05-20 DIAGNOSIS — Z90722 Acquired absence of ovaries, bilateral: Secondary | ICD-10-CM

## 2018-05-20 DIAGNOSIS — Z79899 Other long term (current) drug therapy: Secondary | ICD-10-CM

## 2018-05-20 DIAGNOSIS — H6981 Other specified disorders of Eustachian tube, right ear: Secondary | ICD-10-CM | POA: Insufficient documentation

## 2018-05-20 DIAGNOSIS — C562 Malignant neoplasm of left ovary: Secondary | ICD-10-CM | POA: Diagnosis not present

## 2018-05-20 DIAGNOSIS — C786 Secondary malignant neoplasm of retroperitoneum and peritoneum: Secondary | ICD-10-CM

## 2018-05-20 DIAGNOSIS — Z5111 Encounter for antineoplastic chemotherapy: Secondary | ICD-10-CM | POA: Diagnosis not present

## 2018-05-20 DIAGNOSIS — K5909 Other constipation: Secondary | ICD-10-CM | POA: Insufficient documentation

## 2018-05-20 DIAGNOSIS — G620 Drug-induced polyneuropathy: Secondary | ICD-10-CM | POA: Insufficient documentation

## 2018-05-20 LAB — CBC WITH DIFFERENTIAL (CANCER CENTER ONLY)
Abs Immature Granulocytes: 0.04 10*3/uL (ref 0.00–0.07)
Basophils Absolute: 0.1 10*3/uL (ref 0.0–0.1)
Basophils Relative: 1 %
EOS ABS: 0.2 10*3/uL (ref 0.0–0.5)
EOS PCT: 2 %
HCT: 39.4 % (ref 36.0–46.0)
Hemoglobin: 12.4 g/dL (ref 12.0–15.0)
Immature Granulocytes: 1 %
Lymphocytes Relative: 19 %
Lymphs Abs: 1.5 10*3/uL (ref 0.7–4.0)
MCH: 28.7 pg (ref 26.0–34.0)
MCHC: 31.5 g/dL (ref 30.0–36.0)
MCV: 91.2 fL (ref 80.0–100.0)
Monocytes Absolute: 0.8 10*3/uL (ref 0.1–1.0)
Monocytes Relative: 10 %
Neutro Abs: 5.2 10*3/uL (ref 1.7–7.7)
Neutrophils Relative %: 67 %
Platelet Count: 268 10*3/uL (ref 150–400)
RBC: 4.32 MIL/uL (ref 3.87–5.11)
RDW: 15.7 % — ABNORMAL HIGH (ref 11.5–15.5)
WBC Count: 7.7 10*3/uL (ref 4.0–10.5)
nRBC: 0 % (ref 0.0–0.2)

## 2018-05-20 LAB — COMPREHENSIVE METABOLIC PANEL
ALT: 19 U/L (ref 0–44)
ANION GAP: 7 (ref 5–15)
AST: 22 U/L (ref 15–41)
Albumin: 3.8 g/dL (ref 3.5–5.0)
Alkaline Phosphatase: 81 U/L (ref 38–126)
BILIRUBIN TOTAL: 0.5 mg/dL (ref 0.3–1.2)
BUN: 11 mg/dL (ref 8–23)
CO2: 27 mmol/L (ref 22–32)
Calcium: 10.1 mg/dL (ref 8.9–10.3)
Chloride: 106 mmol/L (ref 98–111)
Creatinine, Ser: 0.92 mg/dL (ref 0.44–1.00)
GFR calc Af Amer: >60 mL/min (ref >60)
GFR calc non Af Amer: 57 mL/min — ABNORMAL LOW (ref >60)
Glucose, Bld: 122 mg/dL — ABNORMAL HIGH (ref 70–99)
Potassium: 4.2 mmol/L (ref 3.5–5.1)
Sodium: 140 mmol/L (ref 135–145)
Total Protein: 7.4 g/dL (ref 6.5–8.1)

## 2018-05-20 MED FILL — ONDANSETRON ODT 4 MG TABLET: 4 | 21 days supply | Qty: 18 | Fill #1

## 2018-05-20 NOTE — Progress Notes (Signed)
Hematology/Oncology Outpatient Progress Note  May 20, 2018  Patient Care Team: Marton Redwood, [name]  ASSESSMENT & PLAN:  Ovarian cancer [facility]) Locally advanced ovarian carcinoma; status post exploratory laparotomy; total abdominal hysterectomy and bilateral salpingo-oophorectomy with omentectomy under the direction of Dr. Precious Haws, gynecologic oncology on April 25, 2018.  -Large left sided ovarian tumor wedged retroperitoneal and inferiorly into pelvis with some adherence to sigmoid mesentary. Some tumor nodules on surface of sigmoid epiploica that was removed. ~1-2 cm tumor implant lateral to cecum also excised. No obvious omental disease. This represented an optimal cytoreduction (R0) with no gross visible disease remaining. No carcinomatosis. Diaphragms and small bowel /mesentary all free of disease.  -The results of the surgical histopathology revealed a high-grade carcinoma spanning 13.3 cm, presumed from the ovarian surface involvement. No definitive tumor was identified within the fallopian tube on the left.  The left fallopian tube was not grossly or microscopically identified and thus involvement could not be assessed.  Within the endometrium was in active endometrium without hyperplasia or malignancy. Endocervix was benign squamous and endocervical mucosa. No dysplasia or malignancy was identified. The right fallopian tube was involved with metastatic carcinoma.  There was a soft tissue sigmoid implant with metastatic carcinoma.  The omentum contained metastatic carcinoma.  There was a soft tissue biopsy and cecal implant that was positive for metastatic carcinoma.  -At the time of her last visit, we discussed the results of her CT imaging of the chest, abdomen and pelvis from October 23 prior to surgery.  Those results are detailed below.  -We discussed also in lengthy detail the results of her surgical resection and histopathology.  Her  midline incision has healed.  -On March 29, 2018, under the direction of Dr. Alvy Bimler, she received cycle 3 of paclitaxel and carboplatin.  She tolerated that therapy without significant difficulty.  -Prior to administration of cycle 4 of paclitaxel and carboplatin on December 4, antiemetics and anti-anaphylactics will be given as per protocol.  -She continues to complain of intermittent numbness of her fingertips and toes due to chemotherapy-induced peripheral neuropathy.  -It was previously recommended that she hold the stool softeners, senna S and MiraLAX, until her stool firms up.  If necessary, restart senna S: 1-2 tablets twice daily as needed.  Since her last visit, she moves her bowels 1-2 times daily without difficulty.  -The results of her laboratory studies from today were reviewed and discussed in detail. Those results are detailed below.  -Barring any unforeseen complications, her next scheduled doctor visit (Dr. Alvy Bimler) with laboratory studies is on December 23 prior to cycle 5 of treatment.  -Cycle 5 of paclitaxel and carboplatin is scheduled on December 26 due to the Christmas holiday.  -She was advised to call us in the interim should any new or untoward problems arise.  INTERVAL HISTORY: She returns with daughter for further follow-up In the interim since her last visit, her appetite is slowly improved. She has lost 2 pounds. She has not been using Boost Plus as recommended. No nausea or vomiting. She no longer has diarrhea. No infectious or bleeding complications No pain syndrome or narcotic analgesics. She is ambulating without difficulty. Energy level fair; less tired and sleepy. She was seen by Dr. Gerarda Fraction on November 27. She continues to have peripheral neuropathy with numbness affecting the tips of fingers and toes now intermittent and slowly improved.  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 Large complex left paramidline pelvic mass likely rises from the left ovary. Together with left periaortic adenopathy and peritoneal carcinomatosis, findings are worrisome for metastatic ovarian cancer. 2. Small pelvic free fluid. 3.  Aortic atherosclerosis (ICD10-170.0).    01/21/2018 Pathology Results    Omentum, biopsy, dominant omental nodule within the right lower abdominal quadrant - POORLY DIFFERENTIATED CARCINOMA CONSISTENT WITH GYNECOLOGIC PRIMARY. - SEE MICROSCOPIC DESCRIPTION. Microscopic Comment The tumor is characterized by sheets of tumor cells with enlarged nuclei, many containing prominent nucleoli. There are patchy areas with eosinophilic cytoplasmic inclusions. Immunohistochemistry shows positivity with cytokeratin AE1/AE3, cytokeratin 7, estrogen receptor, progesterone receptor, PAX-8, WT-1, and placental alkaline phosphatase (PLAP). The tumor is negative with alpha fetoprotein, human chorionic gonadotropin, CD117, CD10, CD30, CD56, CDX-2, CEA, cytokeratin 20, S100, SOX-10, synaptophysin, chromogranin, D2-40, GATA-3, GCDFP, Glypican 3 and Inhibin. 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 The estrogen receptor is 100% with strong staining and the progesterone receptor is 90% with strong staining.    01/21/2018 Procedure    Technically successful CT guided core needle biopsy of dominant omental nodule within the right lower abdominal quadrant, adjacent to the cecum.    01/24/2018 Cancer Staging    Staging form: Ovary, Fallopian Tube, and Primary Peritoneal Carcinoma, AJCC 8th Edition - Clinical: Stage IIIC (cT3c, cN1b, cM0) - Signed by [name], MD on 01/24/2018    01/25/2018 Tumor Marker    Patient's tumor was tested for the following markers: AFP Results of the tumor marker test revealed 4.7    01/31/2018 Surgery    Pre-operative Diagnosis: Ovarian cancer NOS  Post-operative Diagnosis: At least  Stage 3 ovarian cancer   Operation: Laparoscopic omental biopsy  Surgeon: Mart Piggs, MD  Specimens: Omentum  Operative Findings: Omental nodule >2cm. Large left pelvic mass 12-15cm with sigmoid draped medially and potentially adherent to colon. The mass appeared to have a 3cm cystic area that was ruptured preoperatively. Uterus appears normal with small anterior fibroid ~1cm. Right adnexa nonenlarged. Small bowel without evidence of disease. No diaphragmatic disease.     01/31/2018 Pathology Results    Omentum, resection for tumor - HIGH GRADE CARCINOMA. - SEE MICROSCOPIC DESCRIPTION. Microscopic Comment This case is sent to Dr. Annitta Jersey for consultation. The case was discussed with Dr. Gerarda Fraction on 02/01/18. (JDP:kh 02/01/18) The omentum shows nodules of high grade carcinoma characterized by diffuse sheets of malignant cells with enlarged nuclei with prominent nucleoli. There is moderate to abundant eosinophilic cytoplasm and frequent mitotic figures. 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 The entire specimen is examined histologically and there are nodules of tumor throughout the specimen with identical morphology. Additional immunohistochemistry for hepatoid markers will be performed and reported as an addendum    02/11/2018 Procedure    Status post right IJ port catheter placement. Catheter ready for use.    02/14/2018 Tumor Marker    Patient's tumor was tested for the following markers: CA-125 Results of the tumor marker test revealed 1523    02/15/2018 -  Chemotherapy    The patient had carboplatin and taxol. Taxol dose reduced from cycle 2 onwards    02/23/2018 Imaging  Interval development of  abdominal ascites.  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  No significant change in multilobulated complex left pelvic mass, which compresses the pelvic structures and displaces them to the right.  Interval development of mild right hydronephrosis and hydroureter, possibly postobstructive.    03/08/2018 Tumor Marker    Patient's tumor was tested for the following markers: CA-125 Results of the tumor marker test revealed 1844    03/29/2018 Tumor Marker    Patient's tumor was tested for the following markers: CA-125 Results of the tumor marker test revealed 1141    04/10/2018 Imaging    1. New omental nodule. Otherwise, left ovarian mass and right paracolic gutter nodule have decreased in size in the interval. 2. Small ascites, increased. 3. Endometrial cavity may be dilated and contains fluid. Please correlate clinically. 4. Mild prominence of the ureters bilaterally, possibly due to large left adnexal mass. 5. Aortic atherosclerosis (ICD10-170.0). Coronary artery calcification.    04/25/2018 Surgery    Procedure(s) Performed:   1. Exploratory laparotomy  2. TAH/Bilateral salpingo-oophorectomy with radical tumor debulking for ovarian cancer . 3. Omentectomy  Surgeon: Bernita Raisin, MD Specimens: Bilateral tubes and ovaries, omentum, sigmoid nodule, cecal nodule   Operative Findings: Large left sided ovarian tumor wedged retroperitoneal and inferiorly into pelvis with some adherence to sigmoid mesentary. Some tumor nodules on surface of sigmoid epiploica that was removed. ~1-2 cm tumor implant lateral to cecum also excised. No obvious omental disease. This represented an optimal cytoreduction (R0) with no gross visible disease remaining. No carcinomatosis. Diaphragms and small bowel /mesentary all free of disease.     04/25/2018 Pathology Results    1. 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 - PRESUMED  OVARIAN SURFACE INVOLVEMENT. - DEFINITIVE FALLOPIAN TUBE NOT IDENTIFIED. - SEE COMMENT AND ONCOLOGY TABLE. 2. Uterus and cervix, right tube and ovary - UTERUS: -ENDOMETRIUM: INACTIVE ENDOMETRIUM. NO HYPERPLASIA OR MALIGNANCY. -MYOMETRIUM: LEIOMYOMA. NO MALIGNANCY. -SEROSA: UNREMARKABLE. NO MALIGNANCY. - CERVIX: BENIGN SQUAMOUS AND ENDOCERVICAL MUCOSA. NO DYSPLASIA OR MALIGNANCY. - RIGHT OVARY: UNREMARKABLE. NO MALIGNANCY. - RIGHT FALLOPIAN TUBES: METASTATIC CARCINOMA. 3. Soft tissue, biopsy, sigmoid implant - METASTATIC CARCINOMA. 4. Omentum, resection for tumor - METASTATIC CARCINOMA. 5. Soft tissue, biopsy, pelvic cecal implant - METASTATIC CARCINOMA. Microscopic Comment 1. OVARY or FALLOPIAN TUBE or PRIMARY PERITONEUM: Procedure: Left salpingo-oophorectomy. Hysterectomy and right salpingo-oophorectomy, omentectomy and implant biopsies. Specimen Integrity: Disrupted. Tumor Site: Left ovary. Ovarian Surface Involvement (required only if applicable): Presumed surface involvement, see comment. Fallopian Tube Surface Involvement (required only if applicable): Can not be determined, see comment. Tumor Size: 13.3 cm. Histologic Type: See comment. Histologic Grade: High grade. Implants (required for advanced stage serous/seromucinous borderline tumors only): Present. Other Tissue/ Organ Involvement: Right fallopian tube, sigmoid implant, omental implant, cecal implant. Largest Extrapelvic Peritoneal Focus (required only if applicable): 1.6 cm. Peritoneal/Ascitic Fluid: N/A. Treatment Effect (required only for high-grade serous carcinomas): Definitive effect not seen. Regional Lymph Nodes: No lymph nodes submitted or found. Pathologic Stage Classification (pTNM, AJCC 8th Edition): ypT3b, ypNX Representative Tumor Block: 1A-F Comment(s): Residual ovarian parenchyma is not identified and the entire mass consists of tumor, thus, surface involvement is presumably present. The left fallopian  tube is not grossly or microscopically identified and thus involvement can not be assessed. A prior omental biopsy was sent to Dr. Annitta Jersey for consultation and was diagnosed as high grade carcinoma with hepatoid features. The current tumor is compared to the biopsy and  looks morphologically identical.    05/06/2018 Tumor Marker    Patient's tumor was tested for the following markers: CA-125 Results of the tumor marker test revealed 190       04/25/2018                   Surgical Histopathology/Diagnosis                                        1. Adnexa - ovary +/- tube, neoplastic, left                                         - HIGH GRADE CARCINOMA, SPANNING 13.3 CM.                                         - PRESUMED OVARIAN SURFACE INVOLVEMENT.                                         - DEFINITIVE FALLOPIAN TUBE NOT IDENTIFIED.                                         - SEE COMMENT AND ONCOLOGY TABLE.                                        2. Uterus and cervix, right tube and ovary                                         - UTERUS:                                         -ENDOMETRIUM: INACTIVE ENDOMETRIUM.                                           NO HYPERPLASIA OR MALIGNANCY.                                         -MYOMETRIUM: LEIOMYOMA. NO MALIGNANCY.                                         -SEROSA: UNREMARKABLE. NO MALIGNANCY.                                         - CERVIX: BENIGN SQUAMOUS AND ENDOCERVICAL  MUCOSA. NO DYSPLASIA OR MALIGNANCY.                                         - RIGHT OVARY: UNREMARKABLE. NO MALIGNANCY.                                         - RIGHT FALLOPIAN TUBES: METASTATIC                                            CARCINOMA.                                        3. Soft tissue, biopsy, sigmoid implant                                         - METASTATIC CARCINOMA.                                        4. Omentum,  resection for tumor                                         - METASTATIC CARCINOMA.                                        5. Soft tissue, biopsy, pelvic cecal implant                                         - METASTATIC CARCINOMA.  05/22/2018                     Cycle 4 of paclitaxel and carboplatin  REVIEW OF SYSTEMS:   Constitutional: Denies fevers, chills; 2 pound weight loss. Eyes: Denies blurriness of vision Ears, nose, mouth, throat, and face: Denies mucositis or sore throat Respiratory: Denies cough, dyspnea or wheezes Cardiovascular: Denies palpitation, chest discomfort or lower extremity swelling Gastrointestinal: No diarrhea or constipation; no melena or bright red blood per rectum Skin: Denies abnormal skin rashes Lymphatics: Denies new lymphadenopathy or easy bruising Behavioral/Psych: Mood is stable, no new changes  All other systems were reviewed with the patient and are negative.  Past Medical History Reviewed        Family History Reviewed        Social History Reviewed  ALLERGIES:  has No Known Allergies.  MEDICATIONS:  Current Outpatient Medications  Medication Sig Dispense Refill  . acetaminophen (TYLENOL) 500 MG tablet Take 500 mg by mouth every 8 (eight) hours as needed for mild pain or headache.     Marland Kitchen amLODipine (NORVASC) 5 MG tablet Take 5  mg by mouth at bedtime.     . enoxaparin (LOVENOX) 40 MG/0.4ML injection Inject 0.4 mLs (40 mg total) into the skin daily. 23 Syringe 0  . levothyroxine (SYNTHROID, LEVOTHROID) 88 MCG tablet Take 88 mcg by mouth daily before breakfast.    . loratadine (CLARITIN) 10 MG tablet Take 10 mg by mouth daily as needed for allergies.     . metoprolol succinate (TOPROL-XL) 50 MG 24 hr tablet Take 50 mg by mouth every morning.     . Multiple Vitamins-Minerals (MULTIVITAMINS THER. W/MINERALS) TABS Take 1 tablet by mouth daily.      . naphazoline-pheniramine (NAPHCON-A) 0.025-0.3 % ophthalmic solution Place 1 drop into both eyes daily as  needed for eye irritation.    Marland Kitchen olmesartan (BENICAR) 40 MG tablet Take 40 mg by mouth at bedtime.     . senna-docusate (SENOKOT-S) 8.6-50 MG tablet Take 1 tablet by mouth at bedtime. Hold for loose stools 30 tablet 1   No current facility-administered medications for this visit.    PHYSICAL EXAMINATION: ECOG PERFORMANCE STATUS: 1 - Symptomatic but completely ambulatory Vitals:   05/20/18 1115  BP: (!) 146/90  Pulse: 98  Resp: 17  Temp: (!) 97.5 F (36.4 C)  SpO2: 100%   Filed Weights   05/20/18 1115  Weight: 157 lb 4.8 oz (71.4 kg)  GENERAL:alert, no distress and comfortable SKIN: skin color, texture, turgor are normal, no rashes or significant lesions EYES: normal, Conjunctiva are pink and non-injected, sclera clear OROPHARYNX:no exudate, no erythema and lips, buccal mucosa, and tongue normal  NECK: supple, thyroid normal size, non-tender, without nodularity LYMPH:  no palpable lymphadenopathy in the cervical, axillary or inguinal LUNGS: clear to auscultation and percussion with normal breathing effort HEART: regular rate & rhythm and no murmurs and no lower extremity edema ABDOMEN:abdomen soft, palpable pelvic mass arising near the left lower quadrant.  No abdominal ascites.  No tenderness on palpation; intact midline surgical incision. Musculoskeletal:no cyanosis of digits and no clubbing  NEURO: alert & oriented x 3 with fluent speech, no focal motor/sensory deficits  LABORATORY DATA:  I have reviewed the data as listed May 20, 2018  Ref Range & Units 10:18 (05/20/18) 2wk ago (05/06/18) 3wk ago (04/29/18) 3wk ago (04/28/18)  WBC Count 4.0 - 10.5 K/uL 7.7  7.6   8.3   RBC 3.87 - 5.11 MIL/uL 4.32  3.95   2.55Low    Hemoglobin 12.0 - 15.0 g/dL 12.4  11.2Low   10.5Low  CM 7.3Low    HCT 36.0 - 46.0 % 39.4  35.8Low   32.7Low  CM 23.5Low    MCV 80.0 - 100.0 fL 91.2  90.6   92.2   MCH 26.0 - 34.0 pg 28.7  28.4   28.6   MCHC 30.0 - 36.0 g/dL 31.5  31.3   31.1   RDW 11.5 -  15.5 % 15.7High   16.2High    16.8High    Platelet Count 150 - 400 K/uL 268  446High    217   nRBC 0.0 - 0.2 % 0.0  0.0   0.0 CM  Neutrophils Relative % % 67  67     Neutro Abs 1.7 - 7.7 K/uL 5.2  5.2     Lymphocytes Relative % 19  17     Lymphs Abs 0.7 - 4.0 K/uL 1.5  1.3     Monocytes Relative % 10  10     Monocytes Absolute 0.1 - 1.0 K/uL 0.8  0.7  Eosinophils Relative % 2  3     Eosinophils Absolute 0.0 - 0.5 K/uL 0.2  0.2     Basophils Relative % 1  1     Basophils Absolute 0.0 - 0.1 K/uL 0.1  0.1     Immature Granulocytes % 1  2     Abs Immature Granulocytes 0.00 - 0.07 K/uL 0.04  0.17High  CM      Ref Range & Units 10:18 (05/20/18) 2wk ago (05/06/18) 3wk ago (04/29/18) 3wk ago (04/28/18)  Sodium 135 - 145 mmol/L 140  139  139  133Low    Potassium 3.5 - 5.1 mmol/L 4.2  4.2  3.5  3.6   Chloride 98 - 111 mmol/L 106  103  105  103   CO2 22 - 32 mmol/L '27  29  27  25   ' Glucose, Bld 70 - 99 mg/dL 122High   86  110High   118High    BUN 8 - 23 mg/dL 11  8  7Low   8   Creatinine, Ser 0.44 - 1.00 mg/dL 0.92  0.72  0.51  0.48   Calcium 8.9 - 10.3 mg/dL 10.1  9.5  8.6Low   7.8Low    Total Protein 6.5 - 8.1 g/dL 7.4  7.0   5.5Low    Albumin 3.5 - 5.0 g/dL 3.8  3.2Low    2.6Low    AST 15 - 41 U/L '22  26   19   ' ALT 0 - 44 U/L 19  41   18   Alkaline Phosphatase 38 - 126 U/L 81  100   78   Total Bilirubin 0.3 - 1.2 mg/dL 0.5  0.3   0.7   GFR calc non Af Amer >60 mL/min 57Low   >60  >60  >60   GFR calc Af Amer >60 mL/min >60  >60 CM >60 CM >60 CM  Anion gap 5 - '15 7  7 ' CM 7 CM 5 CM  Comment: Performed at [facility], [address], [place], [place] [address]   May 06, 2018 Magnesium is .9 Phosphorus 3.3 CA 125 196 all     Component Value Date/Time   NA 140 05/20/2018 1018   K 4.2 05/20/2018 1018   CL 106 05/20/2018 1018   CO2 27 05/20/2018 1018   GLUCOSE 122 (H) 05/20/2018 1018   BUN 11 05/20/2018 1018   CREATININE 0.92 05/20/2018 1018    CALCIUM 10.1 05/20/2018 1018   PROT 7.4 05/20/2018 1018   ALBUMIN 3.8 05/20/2018 1018   AST 22 05/20/2018 1018   ALT 19 05/20/2018 1018   ALKPHOS 81 05/20/2018 1018   BILITOT 0.5 05/20/2018 1018   GFRNONAA 57 (L) 05/20/2018 1018   GFRAA >60 05/20/2018 1018    No results found for: SPEP, UPEP  Lab Results  Component Value Date   WBC 7.7 05/20/2018   NEUTROABS 5.2 05/20/2018   HGB 12.4 05/20/2018   HCT 39.4 05/20/2018   MCV 91.2 05/20/2018   PLT 268 05/20/2018      Chemistry      Component Value Date/Time   NA 140 05/20/2018 1018   K 4.2 05/20/2018 1018   CL 106 05/20/2018 1018   CO2 27 05/20/2018 1018   BUN 11 05/20/2018 1018   CREATININE 0.92 05/20/2018 1018      Component Value Date/Time   CALCIUM 10.1 05/20/2018 1018   ALKPHOS 81 05/20/2018 1018   AST 22 05/20/2018 1018   ALT  19 05/20/2018 1018   BILITOT 0.5 05/20/2018 1018     Imaging Studies: April 10, 2018 CT CHEST, ABDOMEN, AND PELVIS WITH CONTRAST  TECHNIQUE: Multidetector CT imaging of the chest, abdomen and pelvis was performed following the standard protocol during bolus administration of intravenous contrast.  CONTRAST:  183m OMNIPAQUE IOHEXOL 300 MG/ML  SOLN  COMPARISON:  CT abdomen pelvis 02/23/2018 and 02/19/2013. CT chest 11/19/2010.  FINDINGS: CT CHEST FINDINGS  Cardiovascular: Right IJ Port-A-Cath terminates in the low SVC. Atherosclerotic calcification of the arterial vasculature, including coronary arteries and aortic valve. Heart is at the upper limits of normal in size to mildly enlarged. No pericardial effusion.  Mediastinum/Nodes: Mediastinal lymph nodes are not enlarged by CT size criteria. No hilar or axillary lymph nodes. Esophagus is grossly unremarkable.  Lungs/Pleura: Biapical pleuroparenchymal scarring. 2 mm peripheral lingular nodule (series 7, image 94), unchanged from 11/19/2010 and considered benign. 3 mm left lower lobe nodule (series 7, image 148),  likely stable from 02/19/2013, given differences in slice thickness. Lungs are otherwise clear. No pleural fluid. Airway is unremarkable.  Musculoskeletal: Degenerative changes in the spine. No worrisome lytic or sclerotic lesions.  CT ABDOMEN PELVIS FINDINGS  Hepatobiliary: Subcentimeter low-attenuation lesion in the periphery of the right hepatic lobe is too small to characterize. Liver and gallbladder are otherwise unremarkable. No biliary ductal dilatation.  Pancreas: Negative.  Spleen: Negative.  Adrenals/Urinary Tract: Adrenal glands are unremarkable. Subcentimeter low-attenuation lesions in the kidneys are too small to characterize but statistically, cysts are most likely. Ureters may be minimally prominent. Bladder is displaced to the right due to mass effect from adjacent mass.  Stomach/Bowel: Small hiatal hernia. Stomach is decompressed. Stomach, small bowel and colon are unremarkable. Appendix is not readily visualized.  Vascular/Lymphatic: Atherosclerotic calcification of the arterial vasculature without abdominal aortic aneurysm. No pathologically enlarged lymph nodes.  Reproductive: Uterus is visualized. The endometrial cavity may be dilated and contains fluid. There is a significant amount of streak artifact in this area from a right hip arthroplasty, however. Complex left adnexal mass measures 7.6 x 12.9 cm, previously 8.4 x 15.2 cm. It exerts mass effect on the uterus and bladder.  Other: Small scattered ascites, slightly increased. Right paracolic peritoneal nodule measures 1.8 cm (series 2, image 94), previously 2.3 cm. Left paramidline omental nodule measures 1.3 cm (image 74) and is new. Tiny periumbilical hernia contains fluid.  Musculoskeletal: Right hip arthroplasty. Degenerative changes in the spine. No worrisome lytic or sclerotic lesions.  IMPRESSION: 1. New omental nodule. Otherwise, left ovarian mass and right paracolic gutter  nodule have decreased in size in the interval. 2. Small ascites, increased. 3. Endometrial cavity may be dilated and contains fluid. Please correlate clinically. 4. Mild prominence of the ureters bilaterally, possibly due to large left adnexal mass. 5. Aortic atherosclerosis (ICD10-170.0). Coronary artery calcification.  MLorin PicketM.D. 04/10/2018 14:05  This note was dictated using voice activated technology/software.  Unfortunately, typographical errors are not uncommon, and transcription is subject to mistakes and regrettably misinterpretation.  If necessary, clarification of the above information can be discussed with me at any time.  FOLLOW UP: AS DIRECTED   cc:    NHeath LarkMD          SPrecious Haws MD   RHenreitta Leber MD  Hematology/Oncology [facility] [place] [id number]: [phone]: [phone]

## 2018-05-20 NOTE — Patient Instructions (Signed)
We discussed the results of your laboratory studies from today.  The CA 125 is still pending.  You are scheduled for cycle 4 of paclitaxel and carboplatin on December 4.  Barring any unforeseen complications, your next scheduled doctor visit with laboratory studies is on December 23 with Dr. Alvy Bimler.  Because of the Christmas holiday, cycle 5 of treatment is deferred until December 26.  Please do not hesitate to call should any new or untoward problems arise.

## 2018-05-21 ENCOUNTER — Telehealth: Payer: Self-pay | Admitting: Hematology and Oncology

## 2018-05-21 DIAGNOSIS — Z90710 Acquired absence of both cervix and uterus: Secondary | ICD-10-CM | POA: Diagnosis not present

## 2018-05-21 DIAGNOSIS — C481 Malignant neoplasm of specified parts of peritoneum: Secondary | ICD-10-CM | POA: Diagnosis not present

## 2018-05-21 DIAGNOSIS — M810 Age-related osteoporosis without current pathological fracture: Secondary | ICD-10-CM | POA: Diagnosis not present

## 2018-05-21 DIAGNOSIS — Z90722 Acquired absence of ovaries, bilateral: Secondary | ICD-10-CM | POA: Diagnosis not present

## 2018-05-21 DIAGNOSIS — Z483 Aftercare following surgery for neoplasm: Secondary | ICD-10-CM | POA: Diagnosis not present

## 2018-05-21 DIAGNOSIS — C562 Malignant neoplasm of left ovary: Secondary | ICD-10-CM | POA: Diagnosis not present

## 2018-05-21 LAB — CA 125: Cancer Antigen (CA) 125: 44.1 U/mL — ABNORMAL HIGH (ref 0.0–38.1)

## 2018-05-21 NOTE — Telephone Encounter (Signed)
Spoke with patient daughter and verified dec appointments.  Daughter stated they were my-chart active and not to mail a calendar.

## 2018-05-22 ENCOUNTER — Inpatient Hospital Stay: Payer: Medicare Other

## 2018-05-22 VITALS — BP 149/86 | HR 92 | Temp 97.4°F | Resp 18

## 2018-05-22 DIAGNOSIS — G620 Drug-induced polyneuropathy: Secondary | ICD-10-CM | POA: Diagnosis not present

## 2018-05-22 DIAGNOSIS — C562 Malignant neoplasm of left ovary: Secondary | ICD-10-CM

## 2018-05-22 DIAGNOSIS — T451X5A Adverse effect of antineoplastic and immunosuppressive drugs, initial encounter: Secondary | ICD-10-CM | POA: Diagnosis not present

## 2018-05-22 DIAGNOSIS — Z5111 Encounter for antineoplastic chemotherapy: Secondary | ICD-10-CM | POA: Diagnosis not present

## 2018-05-22 DIAGNOSIS — K5909 Other constipation: Secondary | ICD-10-CM | POA: Diagnosis not present

## 2018-05-22 DIAGNOSIS — C786 Secondary malignant neoplasm of retroperitoneum and peritoneum: Secondary | ICD-10-CM | POA: Diagnosis not present

## 2018-05-22 MED ORDER — PALONOSETRON HCL INJECTION 0.25 MG/5ML
0.2500 mg | Freq: Once | INTRAVENOUS | Status: AC
  Administered 2018-05-22: 0.25 mg via INTRAVENOUS

## 2018-05-22 MED ORDER — DIPHENHYDRAMINE HCL 50 MG/ML IJ SOLN
INTRAMUSCULAR | Status: AC
  Filled 2018-05-22: qty 1

## 2018-05-22 MED ORDER — FAMOTIDINE IN NACL 20-0.9 MG/50ML-% IV SOLN
INTRAVENOUS | Status: AC
  Filled 2018-05-22: qty 50

## 2018-05-22 MED ORDER — PALONOSETRON HCL INJECTION 0.25 MG/5ML
INTRAVENOUS | Status: AC
  Filled 2018-05-22: qty 5

## 2018-05-22 MED ORDER — SODIUM CHLORIDE 0.9 % IV SOLN
448.8000 mg | Freq: Once | INTRAVENOUS | Status: AC
  Administered 2018-05-22: 450 mg via INTRAVENOUS
  Filled 2018-05-22: qty 45

## 2018-05-22 MED ORDER — SODIUM CHLORIDE 0.9 % IV SOLN
131.2500 mg/m2 | Freq: Once | INTRAVENOUS | Status: AC
  Administered 2018-05-22: 246 mg via INTRAVENOUS
  Filled 2018-05-22: qty 41

## 2018-05-22 MED ORDER — SODIUM CHLORIDE 0.9 % IV SOLN
Freq: Once | INTRAVENOUS | Status: AC
  Administered 2018-05-22: 11:00:00 via INTRAVENOUS
  Filled 2018-05-22: qty 5

## 2018-05-22 MED ORDER — DIPHENHYDRAMINE HCL 50 MG/ML IJ SOLN
25.0000 mg | Freq: Once | INTRAMUSCULAR | Status: AC
  Administered 2018-05-22: 25 mg via INTRAVENOUS

## 2018-05-22 MED ORDER — FAMOTIDINE IN NACL 20-0.9 MG/50ML-% IV SOLN
20.0000 mg | Freq: Once | INTRAVENOUS | Status: AC
  Administered 2018-05-22: 20 mg via INTRAVENOUS

## 2018-05-22 MED ORDER — HEPARIN SOD (PORK) LOCK FLUSH 100 UNIT/ML IV SOLN
500.0000 [IU] | Freq: Once | INTRAVENOUS | Status: AC | PRN
  Administered 2018-05-22: 500 [IU]
  Filled 2018-05-22: qty 5

## 2018-05-22 MED ORDER — SODIUM CHLORIDE 0.9% FLUSH
10.0000 mL | INTRAVENOUS | Status: DC | PRN
  Administered 2018-05-22: 10 mL
  Filled 2018-05-22: qty 10

## 2018-05-22 MED ORDER — SODIUM CHLORIDE 0.9 % IV SOLN
Freq: Once | INTRAVENOUS | Status: AC
  Administered 2018-05-22: 10:00:00 via INTRAVENOUS
  Filled 2018-05-22: qty 250

## 2018-05-22 NOTE — Patient Instructions (Signed)
[facility] Discharge Instructions for Patients Receiving Chemotherapy  Today you received the following chemotherapy agents Taxol, Carboplatin  To help prevent nausea and vomiting after your treatment, we encourage you to take your nausea medication as directed  If you develop nausea and vomiting that is not controlled by your nausea medication, call the clinic.   BELOW ARE SYMPTOMS THAT SHOULD BE REPORTED IMMEDIATELY:  *FEVER GREATER THAN 100.5 F  *CHILLS WITH OR WITHOUT FEVER  NAUSEA AND VOMITING THAT IS NOT CONTROLLED WITH YOUR NAUSEA MEDICATION  *UNUSUAL SHORTNESS OF BREATH  *UNUSUAL BRUISING OR BLEEDING  TENDERNESS IN MOUTH AND THROAT WITH OR WITHOUT PRESENCE OF ULCERS  *URINARY PROBLEMS  *BOWEL PROBLEMS  UNUSUAL RASH Items with * indicate a potential emergency and should be followed up as soon as possible.  Feel free to call the clinic should you have any questions or concerns. The clinic phone number is [phone].  Please show the CHEMO ALERT CARD at check-in to the Emergency Department and triage nurse.   

## 2018-06-10 ENCOUNTER — Encounter: Payer: Self-pay | Admitting: Hematology and Oncology

## 2018-06-10 ENCOUNTER — Telehealth: Payer: Self-pay | Admitting: Hematology and Oncology

## 2018-06-10 ENCOUNTER — Inpatient Hospital Stay: Payer: Medicare Other

## 2018-06-10 ENCOUNTER — Inpatient Hospital Stay (HOSPITAL_BASED_OUTPATIENT_CLINIC_OR_DEPARTMENT_OTHER): Payer: Medicare Other | Admitting: Hematology and Oncology

## 2018-06-10 VITALS — BP 151/75 | HR 74 | Temp 97.4°F | Resp 18 | Ht 66.0 in | Wt 158.8 lb

## 2018-06-10 DIAGNOSIS — C569 Malignant neoplasm of unspecified ovary: Secondary | ICD-10-CM

## 2018-06-10 DIAGNOSIS — K5909 Other constipation: Secondary | ICD-10-CM

## 2018-06-10 DIAGNOSIS — C562 Malignant neoplasm of left ovary: Secondary | ICD-10-CM | POA: Diagnosis not present

## 2018-06-10 DIAGNOSIS — G620 Drug-induced polyneuropathy: Secondary | ICD-10-CM

## 2018-06-10 DIAGNOSIS — T451X5A Adverse effect of antineoplastic and immunosuppressive drugs, initial encounter: Secondary | ICD-10-CM | POA: Diagnosis not present

## 2018-06-10 DIAGNOSIS — C786 Secondary malignant neoplasm of retroperitoneum and peritoneum: Secondary | ICD-10-CM | POA: Diagnosis not present

## 2018-06-10 DIAGNOSIS — Z79899 Other long term (current) drug therapy: Secondary | ICD-10-CM

## 2018-06-10 DIAGNOSIS — Z90722 Acquired absence of ovaries, bilateral: Secondary | ICD-10-CM | POA: Diagnosis not present

## 2018-06-10 DIAGNOSIS — Z5111 Encounter for antineoplastic chemotherapy: Secondary | ICD-10-CM | POA: Diagnosis not present

## 2018-06-10 LAB — CBC WITH DIFFERENTIAL/PLATELET
Abs Immature Granulocytes: 0.22 10*3/uL — ABNORMAL HIGH (ref 0.00–0.07)
Basophils Absolute: 0.1 10*3/uL (ref 0.0–0.1)
Basophils Relative: 2 %
Eosinophils Absolute: 0.1 10*3/uL (ref 0.0–0.5)
Eosinophils Relative: 2 %
HEMATOCRIT: 36.8 % (ref 36.0–46.0)
Hemoglobin: 11.8 g/dL — ABNORMAL LOW (ref 12.0–15.0)
Immature Granulocytes: 4 %
LYMPHS ABS: 1.5 10*3/uL (ref 0.7–4.0)
Lymphocytes Relative: 29 %
MCH: 28.9 pg (ref 26.0–34.0)
MCHC: 32.1 g/dL (ref 30.0–36.0)
MCV: 90 fL (ref 80.0–100.0)
Monocytes Absolute: 0.9 10*3/uL (ref 0.1–1.0)
Monocytes Relative: 18 %
Neutro Abs: 2.3 10*3/uL (ref 1.7–7.7)
Neutrophils Relative %: 45 %
Platelets: 270 10*3/uL (ref 150–400)
RBC: 4.09 MIL/uL (ref 3.87–5.11)
RDW: 14.9 % (ref 11.5–15.5)
WBC: 5.1 10*3/uL (ref 4.0–10.5)
nRBC: 0 % (ref 0.0–0.2)

## 2018-06-10 LAB — COMPREHENSIVE METABOLIC PANEL
ALT: 25 U/L (ref 0–44)
AST: 25 U/L (ref 15–41)
Albumin: 3.6 g/dL (ref 3.5–5.0)
Alkaline Phosphatase: 93 U/L (ref 38–126)
Anion gap: 7 (ref 5–15)
BUN: 10 mg/dL (ref 8–23)
CO2: 27 mmol/L (ref 22–32)
Calcium: 9.4 mg/dL (ref 8.9–10.3)
Chloride: 105 mmol/L (ref 98–111)
Creatinine, Ser: 0.74 mg/dL (ref 0.44–1.00)
GFR calc Af Amer: >60 mL/min (ref >60)
GFR calc non Af Amer: >60 mL/min (ref >60)
Glucose, Bld: 98 mg/dL (ref 70–99)
Potassium: 4.1 mmol/L (ref 3.5–5.1)
Sodium: 139 mmol/L (ref 135–145)
Total Bilirubin: 0.3 mg/dL (ref 0.3–1.2)
Total Protein: 7.1 g/dL (ref 6.5–8.1)

## 2018-06-10 MED ORDER — DULOXETINE HCL 20 MG PO CPEP: 20.0000 mg | ORAL_CAPSULE | Freq: Every day | ORAL | 3 refills | Status: DC

## 2018-06-10 MED FILL — DULOXETINE HCL 20 MG CPEP: 20 | 30 days supply | Qty: 30 | Fill #0

## 2018-06-10 NOTE — Assessment & Plan Note (Signed)
She has chronic intermittent constipation This could be exacerbated by Cymbalta and I recommend her to continue on stool softeners and laxatives

## 2018-06-10 NOTE — Assessment & Plan Note (Signed)
This is getting progressively worse I plan to reduce her Taxol dose a little further I recommend a trial of Cymbalta The risks, benefits, side effects of Cymbalta is discussed with the patient and she is willing to proceed

## 2018-06-10 NOTE — Assessment & Plan Note (Addendum)
The patient has mild progressive neuropathy. I plan to reduce the Taxol dose a little further I recommend genetic counselor referral for genetic testing/HRD mutation study

## 2018-06-10 NOTE — Telephone Encounter (Signed)
Gave avs and calendar ° °

## 2018-06-10 NOTE — Progress Notes (Signed)
[place] OFFICE PROGRESS NOTE  Patient Care Team: Marton Redwood, [name]  ASSESSMENT & PLAN:  Ovarian cancer [facility]) The patient has mild progressive neuropathy. I plan to reduce the Taxol dose a little further I recommend genetic counselor referral for genetic testing/HRD mutation study  Peripheral neuropathy due to chemotherapy [facility]) This is getting progressively worse I plan to reduce her Taxol dose a little further I recommend a trial of Cymbalta The risks, benefits, side effects of Cymbalta is discussed with the patient and she is willing to proceed  Other constipation She has chronic intermittent constipation This could be exacerbated by Cymbalta and I recommend her to continue on stool softeners and laxatives   Orders Placed This Encounter  Procedures  . Ambulatory referral to Genetics    Referral Priority:   Routine    Referral Type:   Consultation    Referral Reason:   Specialty Services Required    Number of Visits Requested:   1    INTERVAL HISTORY: Please see below for problem oriented charting. She returns to be seen prior to cycle 5 of chemotherapy Since last time I saw her, she felt mild progressive neuropathy affecting the tips of fingers and toes and also some mild achiness after chemo No recent infection, fever or chills Her wound is healing well She continues to have chronic constipation, well controlled with laxatives She denies the need for of taking pain medicine.  Extra strength Tylenol appears to be helpful to control her bone pain.  SUMMARY OF ONCOLOGIC HISTORY: Oncology History   High grade serous     Ovarian cancer ([place])   01/02/2018 Imaging    1. Large complex left paramidline pelvic mass likely rises from the left ovary. Together with left periaortic adenopathy and peritoneal carcinomatosis, findings are worrisome for metastatic ovarian cancer. 2. Small pelvic free fluid. 3.  Aortic  atherosclerosis (ICD10-170.0).    01/21/2018 Pathology Results    Omentum, biopsy, dominant omental nodule within the right lower abdominal quadrant - POORLY DIFFERENTIATED CARCINOMA CONSISTENT WITH GYNECOLOGIC PRIMARY. - SEE MICROSCOPIC DESCRIPTION. Microscopic Comment The tumor is characterized by sheets of tumor cells with enlarged nuclei, many containing prominent nucleoli. There are patchy areas with eosinophilic cytoplasmic inclusions. Immunohistochemistry shows positivity with cytokeratin AE1/AE3, cytokeratin 7, estrogen receptor, progesterone receptor, PAX-8, WT-1, and placental alkaline phosphatase (PLAP). The tumor is negative with alpha fetoprotein, human chorionic gonadotropin, CD117, CD10, CD30, CD56, CDX-2, CEA, cytokeratin 20, S100, SOX-10, synaptophysin, chromogranin, D2-40, GATA-3, GCDFP, Glypican 3 and Inhibin. 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 The estrogen receptor is 100% with strong staining and the progesterone receptor is 90% with strong staining.    01/21/2018 Procedure    Technically successful CT guided core needle biopsy of dominant omental nodule within the right lower abdominal quadrant, adjacent to the cecum.    01/24/2018 Cancer Staging    Staging form: Ovary, Fallopian Tube, and Primary Peritoneal Carcinoma, AJCC 8th Edition - Clinical: Stage IIIC (cT3c, cN1b, cM0) - Signed by [name], MD on 01/24/2018    01/25/2018 Tumor Marker    Patient's tumor was tested for the following markers: AFP Results of the tumor marker test revealed 4.7    01/31/2018 Surgery    Pre-operative Diagnosis: Ovarian cancer NOS  Post-operative Diagnosis: At least Stage 3 ovarian cancer   Operation: Laparoscopic omental biopsy  Surgeon: Mart Piggs, MD  Specimens: Omentum  Operative Findings:  Omental nodule >2cm. Large left pelvic mass 12-15cm with sigmoid draped medially and potentially adherent to colon. The  mass appeared to have a 3cm cystic area that was ruptured preoperatively. Uterus appears normal with small anterior fibroid ~1cm. Right adnexa nonenlarged. Small bowel without evidence of disease. No diaphragmatic disease.     01/31/2018 Pathology Results    Omentum, resection for tumor - HIGH GRADE CARCINOMA. - SEE MICROSCOPIC DESCRIPTION. Microscopic Comment This case is sent to Dr. Annitta Jersey for consultation. The case was discussed with Dr. Gerarda Fraction on 02/01/18. (JDP:kh 02/01/18) The omentum shows nodules of high grade carcinoma characterized by diffuse sheets of malignant cells with enlarged nuclei with prominent nucleoli. There is moderate to abundant eosinophilic cytoplasm and frequent mitotic figures. 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 The entire specimen is examined histologically and there are nodules of tumor throughout the specimen with identical morphology. Additional immunohistochemistry for hepatoid markers will be performed and reported as an addendum    02/11/2018 Procedure    Status post right IJ port catheter placement. Catheter ready for use.    02/14/2018 Tumor Marker    Patient's tumor was tested for the following markers: CA-125 Results of the tumor marker test revealed 1523    02/15/2018 -  Chemotherapy    The patient had carboplatin and taxol. Taxol dose reduced from cycle 2 onwards    02/23/2018 Imaging    Interval development of abdominal ascites.  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  No significant change in multilobulated complex left pelvic mass, which compresses the pelvic structures and displaces them to the right.  Interval development of mild right  hydronephrosis and hydroureter, possibly postobstructive.    03/08/2018 Tumor Marker    Patient's tumor was tested for the following markers: CA-125 Results of the tumor marker test revealed 1844    03/29/2018 Tumor Marker    Patient's tumor was tested for the following markers: CA-125 Results of the tumor marker test revealed 1141    04/10/2018 Imaging    1. New omental nodule. Otherwise, left ovarian mass and right paracolic gutter nodule have decreased in size in the interval. 2. Small ascites, increased. 3. Endometrial cavity may be dilated and contains fluid. Please correlate clinically. 4. Mild prominence of the ureters bilaterally, possibly due to large left adnexal mass. 5. Aortic atherosclerosis (ICD10-170.0). Coronary artery calcification.    04/25/2018 Surgery    Procedure(s) Performed:   1. Exploratory laparotomy  2. TAH/Bilateral salpingo-oophorectomy with radical tumor debulking for ovarian cancer . 3. Omentectomy  Surgeon: Bernita Raisin, MD Specimens: Bilateral tubes and ovaries, omentum, sigmoid nodule, cecal nodule   Operative Findings: Large left sided ovarian tumor wedged retroperitoneal and inferiorly into pelvis with some adherence to sigmoid mesentary. Some tumor nodules on surface of sigmoid epiploica that was removed. ~1-2 cm tumor implant lateral to cecum also excised. No obvious omental disease. This represented an optimal cytoreduction (R0) with no gross visible disease remaining. No carcinomatosis. Diaphragms and small bowel /mesentary all free of disease.     04/25/2018 Pathology Results    1. 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 - PRESUMED OVARIAN SURFACE INVOLVEMENT. - DEFINITIVE FALLOPIAN TUBE NOT IDENTIFIED. - SEE COMMENT  AND ONCOLOGY TABLE. 2. Uterus and cervix, right tube and ovary - UTERUS: -ENDOMETRIUM: INACTIVE ENDOMETRIUM. NO HYPERPLASIA OR MALIGNANCY. -MYOMETRIUM: LEIOMYOMA. NO  MALIGNANCY. -SEROSA: UNREMARKABLE. NO MALIGNANCY. - CERVIX: BENIGN SQUAMOUS AND ENDOCERVICAL MUCOSA. NO DYSPLASIA OR MALIGNANCY. - RIGHT OVARY: UNREMARKABLE. NO MALIGNANCY. - RIGHT FALLOPIAN TUBES: METASTATIC CARCINOMA. 3. Soft tissue, biopsy, sigmoid implant - METASTATIC CARCINOMA. 4. Omentum, resection for tumor - METASTATIC CARCINOMA. 5. Soft tissue, biopsy, pelvic cecal implant - METASTATIC CARCINOMA. Microscopic Comment 1. OVARY or FALLOPIAN TUBE or PRIMARY PERITONEUM: Procedure: Left salpingo-oophorectomy. Hysterectomy and right salpingo-oophorectomy, omentectomy and implant biopsies. Specimen Integrity: Disrupted. Tumor Site: Left ovary. Ovarian Surface Involvement (required only if applicable): Presumed surface involvement, see comment. Fallopian Tube Surface Involvement (required only if applicable): Can not be determined, see comment. Tumor Size: 13.3 cm. Histologic Type: See comment. Histologic Grade: High grade. Implants (required for advanced stage serous/seromucinous borderline tumors only): Present. Other Tissue/ Organ Involvement: Right fallopian tube, sigmoid implant, omental implant, cecal implant. Largest Extrapelvic Peritoneal Focus (required only if applicable): 1.6 cm. Peritoneal/Ascitic Fluid: N/A. Treatment Effect (required only for high-grade serous carcinomas): Definitive effect not seen. Regional Lymph Nodes: No lymph nodes submitted or found. Pathologic Stage Classification (pTNM, AJCC 8th Edition): ypT3b, ypNX Representative Tumor Block: 1A-F Comment(s): Residual ovarian parenchyma is not identified and the entire mass consists of tumor, thus, surface involvement is presumably present. The left fallopian tube is not grossly or microscopically identified and thus involvement can not be assessed. A prior omental biopsy was sent to Dr. Annitta Jersey for consultation and was diagnosed as high grade carcinoma with hepatoid features. The current tumor is compared  to the biopsy and looks morphologically identical.    05/06/2018 Tumor Marker    Patient's tumor was tested for the following markers: CA-125 Results of the tumor marker test revealed 190     REVIEW OF SYSTEMS:   Constitutional: Denies fevers, chills or abnormal weight loss Eyes: Denies blurriness of vision Ears, nose, mouth, throat, and face: Denies mucositis or sore throat Respiratory: Denies cough, dyspnea or wheezes Cardiovascular: Denies palpitation, chest discomfort or lower extremity swelling Skin: Denies abnormal skin rashes Lymphatics: Denies new lymphadenopathy or easy bruising Neurological:Denies numbness, tingling or new weaknesses Behavioral/Psych: Mood is stable, no new changes  All other systems were reviewed with the patient and are negative.  I have reviewed the past medical history, past surgical history, social history and family history with the patient and they are unchanged from previous note.  ALLERGIES:  has No Known Allergies.  MEDICATIONS:  Current Outpatient Medications  Medication Sig Dispense Refill  . acetaminophen (TYLENOL) 500 MG tablet Take 500 mg by mouth every 8 (eight) hours as needed for mild pain or headache.     Marland Kitchen amLODipine (NORVASC) 5 MG tablet Take 5 mg by mouth at bedtime.     . DULoxetine (CYMBALTA) 20 MG capsule Take 1 capsule (20 mg total) by mouth daily. 30 capsule 3  . levothyroxine (SYNTHROID, LEVOTHROID) 88 MCG tablet Take 88 mcg by mouth daily before breakfast.    . loratadine (CLARITIN) 10 MG tablet Take 10 mg by mouth daily as needed for allergies.     . metoprolol succinate (TOPROL-XL) 50 MG 24 hr tablet Take 50 mg by mouth every morning.     . Multiple Vitamins-Minerals (MULTIVITAMINS THER. W/MINERALS) TABS Take 1 tablet by mouth daily.      . naphazoline-pheniramine (NAPHCON-A) 0.025-0.3 % ophthalmic solution Place 1 drop into both eyes daily as needed for eye irritation.    Marland Kitchen  olmesartan (BENICAR) 40 MG tablet Take 40 mg by  mouth at bedtime.     . senna-docusate (SENOKOT-S) 8.6-50 MG tablet Take 1 tablet by mouth at bedtime. Hold for loose stools 30 tablet 1   No current facility-administered medications for this visit.     PHYSICAL EXAMINATION: ECOG PERFORMANCE STATUS: 1 - Symptomatic but completely ambulatory  Vitals:   06/10/18 0919  BP: (!) 151/75  Pulse: 74  Resp: 18  Temp: (!) 97.4 F (36.3 C)  SpO2: 97%   Filed Weights   06/10/18 0919  Weight: 158 lb 12.8 oz (72 kg)    GENERAL:alert, no distress and comfortable SKIN: skin color, texture, turgor are normal, no rashes or significant lesions EYES: normal, Conjunctiva are pink and non-injected, sclera clear OROPHARYNX:no exudate, no erythema and lips, buccal mucosa, and tongue normal  NECK: supple, thyroid normal size, non-tender, without nodularity LYMPH:  no palpable lymphadenopathy in the cervical, axillary or inguinal LUNGS: clear to auscultation and percussion with normal breathing effort HEART: regular rate & rhythm and no murmurs and no lower extremity edema ABDOMEN:abdomen soft, non-tender and normal bowel sounds Musculoskeletal:no cyanosis of digits and no clubbing  NEURO: alert & oriented x 3 with fluent speech, no focal motor/sensory deficits  LABORATORY DATA:  I have reviewed the data as listed    Component Value Date/Time   NA 139 06/10/2018 0904   K 4.1 06/10/2018 0904   CL 105 06/10/2018 0904   CO2 27 06/10/2018 0904   GLUCOSE 98 06/10/2018 0904   BUN 10 06/10/2018 0904   CREATININE 0.74 06/10/2018 0904   CALCIUM 9.4 06/10/2018 0904   PROT 7.1 06/10/2018 0904   ALBUMIN 3.6 06/10/2018 0904   AST 25 06/10/2018 0904   ALT 25 06/10/2018 0904   ALKPHOS 93 06/10/2018 0904   BILITOT 0.3 06/10/2018 0904   GFRNONAA >60 06/10/2018 0904   GFRAA >60 06/10/2018 0904    No results found for: SPEP, UPEP  Lab Results  Component Value Date   WBC 5.1 06/10/2018   NEUTROABS 2.3 06/10/2018   HGB 11.8 (L) 06/10/2018   HCT  36.8 06/10/2018   MCV 90.0 06/10/2018   PLT 270 06/10/2018      Chemistry      Component Value Date/Time   NA 139 06/10/2018 0904   K 4.1 06/10/2018 0904   CL 105 06/10/2018 0904   CO2 27 06/10/2018 0904   BUN 10 06/10/2018 0904   CREATININE 0.74 06/10/2018 0904      Component Value Date/Time   CALCIUM 9.4 06/10/2018 0904   ALKPHOS 93 06/10/2018 0904   AST 25 06/10/2018 0904   ALT 25 06/10/2018 0904   BILITOT 0.3 06/10/2018 0904      All questions were answered. The patient knows to call the clinic with any problems, questions or concerns. No barriers to learning was detected.  I spent 25 minutes counseling the patient face to face. The total time spent in the appointment was 30 minutes and more than 50% was on counseling and review of test results  [name], MD 06/10/2018 10:55 AM

## 2018-06-11 ENCOUNTER — Telehealth: Payer: Self-pay

## 2018-06-11 LAB — CA 125: Cancer Antigen (CA) 125: 16.4 U/mL (ref 0.0–38.1)

## 2018-06-11 NOTE — Telephone Encounter (Signed)
Called and given CA-125 results. Left message to call office for questions.

## 2018-06-11 NOTE — Telephone Encounter (Signed)
-----   Message from [name], MD sent at 06/11/2018  8:14 AM EST ----- Regarding: CA-125 Pls let her know result is now normal She has poor hearing. Might be better calling family ----- Message ----- From: Buel Ream, Lab In [place] Sent: 06/10/2018   9:19 AM EST To: [name], MD

## 2018-06-13 ENCOUNTER — Other Ambulatory Visit: Payer: Self-pay | Admitting: Medical

## 2018-06-13 ENCOUNTER — Inpatient Hospital Stay: Payer: Medicare Other

## 2018-06-13 ENCOUNTER — Inpatient Hospital Stay (HOSPITAL_BASED_OUTPATIENT_CLINIC_OR_DEPARTMENT_OTHER): Payer: Medicare Other | Admitting: Medical

## 2018-06-13 VITALS — BP 148/82 | HR 99 | Temp 97.7°F | Resp 20

## 2018-06-13 DIAGNOSIS — H6591 Unspecified nonsuppurative otitis media, right ear: Secondary | ICD-10-CM

## 2018-06-13 DIAGNOSIS — H6981 Other specified disorders of Eustachian tube, right ear: Secondary | ICD-10-CM | POA: Diagnosis not present

## 2018-06-13 DIAGNOSIS — Z5111 Encounter for antineoplastic chemotherapy: Secondary | ICD-10-CM | POA: Diagnosis not present

## 2018-06-13 DIAGNOSIS — C786 Secondary malignant neoplasm of retroperitoneum and peritoneum: Secondary | ICD-10-CM | POA: Diagnosis not present

## 2018-06-13 DIAGNOSIS — C569 Malignant neoplasm of unspecified ovary: Secondary | ICD-10-CM

## 2018-06-13 DIAGNOSIS — H6501 Acute serous otitis media, right ear: Secondary | ICD-10-CM | POA: Diagnosis not present

## 2018-06-13 DIAGNOSIS — K5909 Other constipation: Secondary | ICD-10-CM | POA: Diagnosis not present

## 2018-06-13 DIAGNOSIS — Z79899 Other long term (current) drug therapy: Secondary | ICD-10-CM

## 2018-06-13 DIAGNOSIS — C562 Malignant neoplasm of left ovary: Secondary | ICD-10-CM | POA: Diagnosis not present

## 2018-06-13 DIAGNOSIS — G620 Drug-induced polyneuropathy: Secondary | ICD-10-CM | POA: Diagnosis not present

## 2018-06-13 DIAGNOSIS — T451X5A Adverse effect of antineoplastic and immunosuppressive drugs, initial encounter: Secondary | ICD-10-CM | POA: Diagnosis not present

## 2018-06-13 MED ORDER — DIPHENHYDRAMINE HCL 50 MG/ML IJ SOLN
25.0000 mg | Freq: Once | INTRAMUSCULAR | Status: AC
  Administered 2018-06-13: 25 mg via INTRAVENOUS

## 2018-06-13 MED ORDER — SODIUM CHLORIDE 0.9 % IV SOLN
116.6667 mg/m2 | Freq: Once | INTRAVENOUS | Status: AC
  Administered 2018-06-13: 216 mg via INTRAVENOUS
  Filled 2018-06-13: qty 36

## 2018-06-13 MED ORDER — HEPARIN SOD (PORK) LOCK FLUSH 100 UNIT/ML IV SOLN
500.0000 [IU] | Freq: Once | INTRAVENOUS | Status: AC | PRN
  Administered 2018-06-13: 500 [IU]
  Filled 2018-06-13: qty 5

## 2018-06-13 MED ORDER — FAMOTIDINE IN NACL 20-0.9 MG/50ML-% IV SOLN
INTRAVENOUS | Status: AC
  Filled 2018-06-13: qty 50

## 2018-06-13 MED ORDER — FAMOTIDINE IN NACL 20-0.9 MG/50ML-% IV SOLN
20.0000 mg | Freq: Once | INTRAVENOUS | Status: AC
  Administered 2018-06-13: 20 mg via INTRAVENOUS

## 2018-06-13 MED ORDER — FLUTICASONE PROPIONATE 50 MCG/ACT NA SUSP: 2.0000 | Freq: Two times a day (BID) | NASAL | 2 refills | Status: DC

## 2018-06-13 MED ORDER — SODIUM CHLORIDE 0.9 % IV SOLN
Freq: Once | INTRAVENOUS | Status: AC
  Administered 2018-06-13: 09:00:00 via INTRAVENOUS
  Filled 2018-06-13: qty 250

## 2018-06-13 MED ORDER — SODIUM CHLORIDE 0.9 % IV SOLN
Freq: Once | INTRAVENOUS | Status: AC
  Administered 2018-06-13: 10:00:00 via INTRAVENOUS
  Filled 2018-06-13: qty 5

## 2018-06-13 MED ORDER — SODIUM CHLORIDE 0.9 % IV SOLN
448.8000 mg | Freq: Once | INTRAVENOUS | Status: AC
  Administered 2018-06-13: 450 mg via INTRAVENOUS
  Filled 2018-06-13: qty 45

## 2018-06-13 MED ORDER — AZITHROMYCIN 250 MG PO TABS: ORAL_TABLET | ORAL | 0 refills | Status: DC

## 2018-06-13 MED ORDER — PALONOSETRON HCL INJECTION 0.25 MG/5ML
INTRAVENOUS | Status: AC
  Filled 2018-06-13: qty 5

## 2018-06-13 MED ORDER — PALONOSETRON HCL INJECTION 0.25 MG/5ML
0.2500 mg | Freq: Once | INTRAVENOUS | Status: AC
  Administered 2018-06-13: 0.25 mg via INTRAVENOUS

## 2018-06-13 MED ORDER — DIPHENHYDRAMINE HCL 50 MG/ML IJ SOLN
INTRAMUSCULAR | Status: AC
  Filled 2018-06-13: qty 1

## 2018-06-13 MED ORDER — SODIUM CHLORIDE 0.9% FLUSH
10.0000 mL | INTRAVENOUS | Status: DC | PRN
  Administered 2018-06-13: 10 mL
  Filled 2018-06-13: qty 10

## 2018-06-13 MED FILL — FLUTICASONE PROP 50 MCG SPR: 50 | 30 days supply | Qty: 16 | Fill #0

## 2018-06-13 MED FILL — AZITHROMYCIN 250 MG TABLET: 250 | 5 days supply | Qty: 6 | Fill #0

## 2018-06-13 NOTE — Patient Instructions (Signed)
    [facility] Discharge Instructions for Patients Receiving Chemotherapy  Today you received the following chemotherapy agents Taxol and Carboplatin   To help prevent nausea and vomiting after your treatment, we encourage you to take your nausea medication as directed.    If you develop nausea and vomiting that is not controlled by your nausea medication, call the clinic.   BELOW ARE SYMPTOMS THAT SHOULD BE REPORTED IMMEDIATELY:  *FEVER GREATER THAN 100.5 F  *CHILLS WITH OR WITHOUT FEVER  NAUSEA AND VOMITING THAT IS NOT CONTROLLED WITH YOUR NAUSEA MEDICATION  *UNUSUAL SHORTNESS OF BREATH  *UNUSUAL BRUISING OR BLEEDING  TENDERNESS IN MOUTH AND THROAT WITH OR WITHOUT PRESENCE OF ULCERS  *URINARY PROBLEMS  *BOWEL PROBLEMS  UNUSUAL RASH Items with * indicate a potential emergency and should be followed up as soon as possible.  Feel free to call the clinic should you have any questions or concerns. The clinic phone number is [phone].  Please show the CHEMO ALERT CARD at check-in to the Emergency Department and triage nurse.   

## 2018-06-14 ENCOUNTER — Telehealth: Payer: Self-pay

## 2018-06-14 NOTE — Telephone Encounter (Signed)
Per 12/26 no los 

## 2018-06-14 NOTE — Progress Notes (Signed)
[facility] Progress Note   Shannon Ellis [id number] 09-Jul-1931 84 y.o.  Shannon Ellis is managed by Dr. [name]  Actively treated with chemotherapy/immunotherapy/hormonal therapy: yes  Current Therapy: Carboplatin and paclitaxel  Last Treated: 06/13/2018 (cycle 5, day 1)  Assessment: Plan:    Eustachian tube dysfunction, right  Right acute serous otitis media, recurrence not specified  Malignant neoplasm of ovary, unspecified laterality ([place])   Eustachian tube dysfunction: The patient was given a prescription for Flonase nasal spray with instructions to use 1 spray in each nostril twice daily.  Right acute serous otitis media: The patient was given a prescription for a Z-Pak.  Malignant neoplasm of the ovary: The patient continues to be followed by Dr. [name] and received cycle 5, day 1 of carboplatin and paclitaxel chemotherapy today.  Please see After Visit Summary for patient specific instructions.  Future Appointments  Date Time Provider [place]  06/25/2018  1:45 PM [name], MD [facility] None  07/05/2018  8:45 AM [facility] LAB 2 [facility] None  07/05/2018  9:00 AM [facility] [place] None  07/05/2018  9:30 AM Alvy Bimler, Ernst Spell, MD [facility] None  07/05/2018 10:15 AM [facility] INFUSION [facility] None  07/15/2018  9:00 AM Clarene Essex, Counselor [facility] None  07/15/2018 10:00 AM [facility] LAB 6 [facility] None    No orders of the defined types were placed in this encounter.      Subjective:   Patient ID:  Shannon Ellis is a 84 y.o. (DOB 1932-02-15) female.  Chief Complaint: No chief complaint on file.   HPI Shannon Ellis is an 84 year old female with a history of an ovarian carcinoma who is managed by Dr. [name] and received cycle 5, day 1 of carboplatin and paclitaxel chemotherapy today.  She reported having right ear pain while she was being treated today.  She was evaluated after her  treatment.  She denies fevers, chills, sweats, facial pressure, facial pain, postnasal drainage, congestion, or a cough.  Medications: I have reviewed the patient's current medications.  Allergies: No Known Allergies  Past Medical History:  Diagnosis Date  . Allergy   . Anemia   . Blood transfusion without reported diagnosis   . Cancer ([place])   . Complication of anesthesia    waking up-does not take much medication  . Family history of adverse reaction to anesthesia    problems waking up as does not need much medication  . Hypertension   . Hypothyroidism   . Osteoporosis 10/07   Dexa scan  . PONV (postoperative nausea and vomiting)     Past Surgical History:  Procedure Laterality Date  . APPENDECTOMY  1950   not ruptured  . DEBULKING N/A 04/25/2018   Procedure: DEBULKING;  Surgeon: [name], MD;  Location: [facility];  Service: Gynecology;  Laterality: N/A;  . HYSTERECTOMY ABDOMINAL WITH SALPINGO-OOPHORECTOMY Bilateral 04/25/2018   Procedure: HYSTERECTOMY ABDOMINAL WITH BILATERAL SALPINGO-OOPHORECTOMY;  Surgeon: [name], MD;  Location: [facility];  Service: Gynecology;  Laterality: Bilateral;  . IR IMAGING GUIDED PORT INSERTION  02/11/2018  . LAPAROSCOPY N/A 01/31/2018   Procedure: LAPAROSCOPY DIAGNOSTIC WITH BIOPSY;  Surgeon: [name], MD;  Location: [facility];  Service: Gynecology;  Laterality: N/A;  . OMENTECTOMY N/A 04/25/2018   Procedure: OMENTECTOMY;  Surgeon: [name], MD;  Location: [facility];  Service: Gynecology;  Laterality: N/A;  . ovarian biopsy    . THYROIDECTOMY    . TONSILLECTOMY    .  TOTAL HIP ARTHROPLASTY Right     Family History  Problem Relation Age of Onset  . Diabetes Mother   . Stroke Mother   . Breast cancer Sister 53       breast ca  . Diabetes Sister   . Lung cancer Brother   . Stroke Brother     Social History   Socioeconomic History  . Marital status: Widowed    Spouse name: Not on file  . Number of children: 4  .  Years of education: Not on file  . Highest education level: Not on file  Occupational History  . 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 Financial resource strain: Not on file  . Food insecurity:    Worry: Not on file    Inability: Not on file  . Transportation needs:    Medical: Not on file    Non-medical: Not on file  Tobacco Use  . Smoking status: Never Smoker  . Smokeless tobacco: Never Used  Substance and Sexual Activity  . Alcohol use: No    Alcohol/week: 0.0 standard drinks  . Drug use: No  . Sexual activity: Not Currently  Lifestyle  . Physical activity:    Days per week: Not on file    Minutes per session: Not on file  . Stress: Not on file  Relationships  . Social connections:    Talks on phone: Not on file    Gets together: Not on file    Attends religious service: Not on file    Active member of club or organization: Not on file    Attends meetings of clubs or organizations: Not on file    Relationship status: Not on file  . Intimate partner violence:    Fear of current or ex partner: Not on file    Emotionally abused: Not on file    Physically abused: Not on file    Forced sexual activity: Not on file  Other Topics Concern  . Not on file  Social History Narrative  . Not on file    Past Medical History, Surgical history, Social history, and Family history were reviewed and updated as appropriate.   Please see review of systems for further details on the patient's review from today.   Review of Systems:  Review of Systems  Constitutional: Negative for chills, diaphoresis, fatigue and fever.  HENT: Positive for ear pain. Negative for congestion, ear discharge, postnasal drip, rhinorrhea and sore throat.   Respiratory: Negative for cough, shortness of breath and wheezing.   Cardiovascular: Negative for palpitations.  Neurological: Negative for headaches.    Objective:   Physical Exam:  There were no vitals taken for  this visit. ECOG: 1  Physical Exam Constitutional:      General: She is not in acute distress.    Appearance: She is not diaphoretic.  HENT:     Head: Normocephalic and atraumatic.     Right Ear: Ear canal and external ear normal.     Left Ear: Tympanic membrane, ear canal and external ear normal.     Ears:     Comments: The right tympanic membrane shows a serous effusion.    Mouth/Throat:     Pharynx: No oropharyngeal exudate.  Neck:     Musculoskeletal: Normal range of motion and neck supple.  Cardiovascular:     Rate and Rhythm: Normal rate and regular rhythm.     Heart sounds: Normal heart sounds.  No murmur. No friction rub. No gallop.   Pulmonary:     Effort: Pulmonary effort is normal. No respiratory distress.     Breath sounds: Normal breath sounds. No wheezing or rales.  Lymphadenopathy:     Cervical: No cervical adenopathy.  Skin:    General: Skin is warm and dry.     Findings: No erythema or rash.  Neurological:     Mental Status: She is alert.     Coordination: Coordination normal.  Psychiatric:        Behavior: Behavior normal.        Thought Content: Thought content normal.        Judgment: Judgment normal.     Lab Review:     Component Value Date/Time   NA 139 06/10/2018 0904   K 4.1 06/10/2018 0904   CL 105 06/10/2018 0904   CO2 27 06/10/2018 0904   GLUCOSE 98 06/10/2018 0904   BUN 10 06/10/2018 0904   CREATININE 0.74 06/10/2018 0904   CALCIUM 9.4 06/10/2018 0904   PROT 7.1 06/10/2018 0904   ALBUMIN 3.6 06/10/2018 0904   AST 25 06/10/2018 0904   ALT 25 06/10/2018 0904   ALKPHOS 93 06/10/2018 0904   BILITOT 0.3 06/10/2018 0904   GFRNONAA >60 06/10/2018 0904   GFRAA >60 06/10/2018 0904       Component Value Date/Time   WBC 5.1 06/10/2018 0904   RBC 4.09 06/10/2018 0904   HGB 11.8 (L) 06/10/2018 0904   HGB 12.4 05/20/2018 1018   HCT 36.8 06/10/2018 0904   PLT 270 06/10/2018 0904   PLT 268 05/20/2018 1018   MCV 90.0 06/10/2018 0904   MCH  28.9 06/10/2018 0904   MCHC 32.1 06/10/2018 0904   RDW 14.9 06/10/2018 0904   LYMPHSABS 1.5 06/10/2018 0904   MONOABS 0.9 06/10/2018 0904   EOSABS 0.1 06/10/2018 0904   BASOSABS 0.1 06/10/2018 0904   -------------------------------  Imaging from last 24 hours (if applicable):  Radiology interpretation: No results found.

## 2018-06-17 ENCOUNTER — Ambulatory Visit: Payer: Medicare Other | Admitting: Obstetrics

## 2018-06-25 ENCOUNTER — Inpatient Hospital Stay: Payer: Medicare Other | Attending: Obstetrics | Admitting: Obstetrics

## 2018-06-25 ENCOUNTER — Encounter: Payer: Self-pay | Admitting: Obstetrics

## 2018-06-25 VITALS — BP 156/72 | HR 68 | Temp 97.3°F | Resp 16 | Ht 66.0 in | Wt 161.0 lb

## 2018-06-25 DIAGNOSIS — Z90710 Acquired absence of both cervix and uterus: Secondary | ICD-10-CM

## 2018-06-25 DIAGNOSIS — C569 Malignant neoplasm of unspecified ovary: Secondary | ICD-10-CM | POA: Insufficient documentation

## 2018-06-25 DIAGNOSIS — Z9221 Personal history of antineoplastic chemotherapy: Secondary | ICD-10-CM | POA: Insufficient documentation

## 2018-06-25 DIAGNOSIS — Z90722 Acquired absence of ovaries, bilateral: Secondary | ICD-10-CM | POA: Insufficient documentation

## 2018-06-25 MED FILL — AMLODIPINE BESYLATE 5 MG TA: 5 | 30 days supply | Qty: 30 | Fill #0

## 2018-06-25 NOTE — Progress Notes (Signed)
S: On 04/25/18 underwent Interval Debulking with ELAP/TAH/BSO with radical tumor debulking for ovarian cancer/omentectomy  Findings as follows: Large left sided ovarian tumor wedged retroperitoneal and inferiorly into pelvis with some adherence to sigmoid mesentary. Some tumor nodules on surface of sigmoid epiploica that was removed. ~1-2 cm tumor implant lateral to cecum also excised. No obvious omental disease. This represented an optimal cytoreduction (R0) with no gross visible disease remaining. No carcinomatosis. Diaphragms and small bowel /mesentary all free of disease. Pathology as follows: 1. 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 - PRESUMED OVARIAN SURFACE INVOLVEMENT. - DEFINITIVE FALLOPIAN TUBE NOT IDENTIFIED. - SEE COMMENT AND ONCOLOGY TABLE. 2. Uterus and cervix, right tube and ovary - UTERUS: -ENDOMETRIUM: INACTIVE ENDOMETRIUM. NO HYPERPLASIA OR MALIGNANCY. -MYOMETRIUM: LEIOMYOMA. NO MALIGNANCY. -SEROSA: UNREMARKABLE. NO MALIGNANCY. - CERVIX: BENIGN SQUAMOUS AND ENDOCERVICAL MUCOSA. NO DYSPLASIA OR MALIGNANCY. - RIGHT OVARY: UNREMARKABLE. NO MALIGNANCY. - RIGHT FALLOPIAN TUBES: METASTATIC CARCINOMA. 3. Soft tissue, biopsy, sigmoid implant - METASTATIC CARCINOMA. 4. Omentum, resection for tumor - METASTATIC CARCINOMA. 5. Soft tissue, biopsy, pelvic cecal implant - METASTATIC CARCINOMA. Comment(s): Residual ovarian parenchyma is not identified and the entire mass consists of tumor, thus, surface involvement is presumably present. The left fallopian tube is not grossly or microscopically identified and thus involvement can not be assessed. A prior omental biopsy was sent to Dr. Annitta Jersey for consultation and was diagnosed as high grade carcinoma with hepatoid features. The current tumor is compared to the biopsy and looks morphologically identical.  Initial postop course notable for ileus that resolved spontaneously. No  bowel/bladder complaints. Mild Grade 1 neuropathy. Denies fever, denies pain. Has one more chemotherapy treatment remaining. Here for vaginal cuff check/pelvic and continued Gyn Onc follow-up. States genetics set up for end of January and that will determine if PARP to be given.  O:  Vitals:   06/25/18 1352  BP: (!) 156/72  Pulse: 68  Resp: 16  Temp: (!) 97.3 F (36.3 C)  SpO2: 100%   General :  Well developed, 85 y.o., female in no apparent distress HEENT:  Normocephalic/atraumatic, symmetric, EOMI, eyelids normal Neck:   Supple, no masses.  Lymphatics:  No cervical/ submandibular/ supraclavicular/ infraclavicular/ inguinal adenopathy Respiratory:  Respirations unlabored, no use of accessory muscles CV:   Deferred Breast:  Deferred Musculoskeletal: No CVA tenderness, normal muscle strength. Abdomen:  Soft, non-tender and nondistended. No evidence of hernia. No masses. Incision healed Extremities:  No lymphedema, no erythema, non-tender. Skin:   Normal inspection Neuro/Psych:  No focal motor deficit, no abnormal mental status. Normal gait. Normal affect. Alert and oriented to person, place, and time  Genito Urinary: Vulva: Normal external female genitalia.  Bladder/urethra: Urethral meatus normal in size and location. No lesions or   masses, well supported bladder Bimanual exam: No masses, intact vaginal cuff Cervix/Uterus/Adnexa: Surgically absent  Adnexal region: No masses. Rectovaginal:  Good tone, no masses, no cul de sac nodularity, no parametrial involvement or nodularity.   A/P Ovarian CA S/p 3 cycles chemo followed by interval debulking and now 2 additional chemo cycles    Continue chemo with Dr.Gorsuch Agree with genetics and then possible PARP, per Dr. Alvy Bimler RTC 3 months for a pelvic  Cc: Marton Redwood, MD (Referring PCP) [name], MD (Medical Oncology) (not this visit)

## 2018-06-25 NOTE — Patient Instructions (Signed)
Return in 3 months for pelvic with Dr. Gerarda Fraction

## 2018-07-05 ENCOUNTER — Encounter: Payer: Self-pay | Admitting: Hematology and Oncology

## 2018-07-05 ENCOUNTER — Telehealth: Payer: Self-pay | Admitting: Hematology and Oncology

## 2018-07-05 ENCOUNTER — Inpatient Hospital Stay: Payer: Medicare Other

## 2018-07-05 ENCOUNTER — Inpatient Hospital Stay (HOSPITAL_BASED_OUTPATIENT_CLINIC_OR_DEPARTMENT_OTHER): Payer: Medicare Other | Admitting: Hematology and Oncology

## 2018-07-05 VITALS — HR 98

## 2018-07-05 VITALS — BP 155/84 | HR 112 | Temp 97.6°F | Resp 18 | Ht 66.0 in | Wt 159.6 lb

## 2018-07-05 DIAGNOSIS — R21 Rash and other nonspecific skin eruption: Secondary | ICD-10-CM | POA: Diagnosis not present

## 2018-07-05 DIAGNOSIS — C562 Malignant neoplasm of left ovary: Secondary | ICD-10-CM

## 2018-07-05 DIAGNOSIS — T451X5A Adverse effect of antineoplastic and immunosuppressive drugs, initial encounter: Secondary | ICD-10-CM

## 2018-07-05 DIAGNOSIS — K5909 Other constipation: Secondary | ICD-10-CM

## 2018-07-05 DIAGNOSIS — C569 Malignant neoplasm of unspecified ovary: Secondary | ICD-10-CM | POA: Diagnosis not present

## 2018-07-05 DIAGNOSIS — G620 Drug-induced polyneuropathy: Secondary | ICD-10-CM

## 2018-07-05 DIAGNOSIS — Z90710 Acquired absence of both cervix and uterus: Secondary | ICD-10-CM | POA: Diagnosis not present

## 2018-07-05 DIAGNOSIS — Z9221 Personal history of antineoplastic chemotherapy: Secondary | ICD-10-CM | POA: Diagnosis not present

## 2018-07-05 DIAGNOSIS — Z90722 Acquired absence of ovaries, bilateral: Secondary | ICD-10-CM | POA: Diagnosis not present

## 2018-07-05 LAB — CBC WITH DIFFERENTIAL/PLATELET
Abs Immature Granulocytes: 0.1 10*3/uL — ABNORMAL HIGH (ref 0.00–0.07)
Basophils Absolute: 0 10*3/uL (ref 0.0–0.1)
Basophils Relative: 0 %
Eosinophils Absolute: 0 10*3/uL (ref 0.0–0.5)
Eosinophils Relative: 0 %
HCT: 38.5 % (ref 36.0–46.0)
Hemoglobin: 12.7 g/dL (ref 12.0–15.0)
Immature Granulocytes: 1 %
Lymphocytes Relative: 14 %
Lymphs Abs: 1.2 10*3/uL (ref 0.7–4.0)
MCH: 28.7 pg (ref 26.0–34.0)
MCHC: 33 g/dL (ref 30.0–36.0)
MCV: 87.1 fL (ref 80.0–100.0)
Monocytes Absolute: 0.2 10*3/uL (ref 0.1–1.0)
Monocytes Relative: 3 %
Neutro Abs: 7.3 10*3/uL (ref 1.7–7.7)
Neutrophils Relative %: 82 %
Platelets: 280 10*3/uL (ref 150–400)
RBC: 4.42 MIL/uL (ref 3.87–5.11)
RDW: 14.4 % (ref 11.5–15.5)
WBC: 8.8 10*3/uL (ref 4.0–10.5)
nRBC: 0 % (ref 0.0–0.2)

## 2018-07-05 LAB — COMPREHENSIVE METABOLIC PANEL
ALBUMIN: 3.9 g/dL (ref 3.5–5.0)
ALT: 22 U/L (ref 0–44)
AST: 22 U/L (ref 15–41)
Alkaline Phosphatase: 105 U/L (ref 38–126)
Anion gap: 13 (ref 5–15)
BUN: 25 mg/dL — ABNORMAL HIGH (ref 8–23)
CALCIUM: 9.7 mg/dL (ref 8.9–10.3)
CO2: 20 mmol/L — ABNORMAL LOW (ref 22–32)
Chloride: 105 mmol/L (ref 98–111)
Creatinine, Ser: 1.03 mg/dL — ABNORMAL HIGH (ref 0.44–1.00)
GFR calc Af Amer: 58 mL/min — ABNORMAL LOW (ref >60)
GFR calc non Af Amer: 50 mL/min — ABNORMAL LOW (ref >60)
Glucose, Bld: 249 mg/dL — ABNORMAL HIGH (ref 70–99)
Potassium: 4 mmol/L (ref 3.5–5.1)
Sodium: 138 mmol/L (ref 135–145)
Total Bilirubin: 0.4 mg/dL (ref 0.3–1.2)
Total Protein: 8 g/dL (ref 6.5–8.1)

## 2018-07-05 MED ORDER — DIPHENHYDRAMINE HCL 50 MG/ML IJ SOLN
25.0000 mg | Freq: Once | INTRAMUSCULAR | Status: AC
  Administered 2018-07-05: 25 mg via INTRAVENOUS

## 2018-07-05 MED ORDER — SODIUM CHLORIDE 0.9% FLUSH
10.0000 mL | INTRAVENOUS | Status: DC | PRN
  Administered 2018-07-05: 10 mL
  Filled 2018-07-05: qty 10

## 2018-07-05 MED ORDER — FAMOTIDINE IN NACL 20-0.9 MG/50ML-% IV SOLN
20.0000 mg | Freq: Once | INTRAVENOUS | Status: AC
  Administered 2018-07-05: 20 mg via INTRAVENOUS

## 2018-07-05 MED ORDER — PALONOSETRON HCL INJECTION 0.25 MG/5ML
INTRAVENOUS | Status: AC
  Filled 2018-07-05: qty 5

## 2018-07-05 MED ORDER — HEPARIN SOD (PORK) LOCK FLUSH 100 UNIT/ML IV SOLN
500.0000 [IU] | Freq: Once | INTRAVENOUS | Status: AC | PRN
  Administered 2018-07-05: 500 [IU]
  Filled 2018-07-05: qty 5

## 2018-07-05 MED ORDER — SODIUM CHLORIDE 0.9 % IV SOLN
Freq: Once | INTRAVENOUS | Status: AC
  Administered 2018-07-05: 11:00:00 via INTRAVENOUS
  Filled 2018-07-05: qty 5

## 2018-07-05 MED ORDER — PALONOSETRON HCL INJECTION 0.25 MG/5ML
0.2500 mg | Freq: Once | INTRAVENOUS | Status: AC
  Administered 2018-07-05: 0.25 mg via INTRAVENOUS

## 2018-07-05 MED ORDER — SODIUM CHLORIDE 0.9 % IV SOLN
Freq: Once | INTRAVENOUS | Status: AC
  Administered 2018-07-05: 10:00:00 via INTRAVENOUS
  Filled 2018-07-05: qty 250

## 2018-07-05 MED ORDER — FAMOTIDINE IN NACL 20-0.9 MG/50ML-% IV SOLN
INTRAVENOUS | Status: AC
  Filled 2018-07-05: qty 50

## 2018-07-05 MED ORDER — SODIUM CHLORIDE 0.9 % IV SOLN
116.6667 mg/m2 | Freq: Once | INTRAVENOUS | Status: AC
  Administered 2018-07-05: 216 mg via INTRAVENOUS
  Filled 2018-07-05: qty 36

## 2018-07-05 MED ORDER — SODIUM CHLORIDE 0.9 % IV SOLN
440.0000 mg | Freq: Once | INTRAVENOUS | Status: AC
  Administered 2018-07-05: 440 mg via INTRAVENOUS
  Filled 2018-07-05: qty 44

## 2018-07-05 MED ORDER — DIPHENHYDRAMINE HCL 50 MG/ML IJ SOLN
INTRAMUSCULAR | Status: AC
  Filled 2018-07-05: qty 1

## 2018-07-05 NOTE — Progress Notes (Signed)
[place] OFFICE PROGRESS NOTE  Patient Care Team: Marton Redwood, [name]  ASSESSMENT & PLAN:  Ovarian cancer [facility]) The patient has mild stable neuropathy, along with other mild nonspecific complaints with skin rashes and constipation. I plan to continue on reduced dose Taxol I recommend genetic counselor referral for genetic testing/HRD mutation study, appointment pending We will complete final treatment today and a plan to repeat imaging study next month for further follow-up  Peripheral neuropathy due to chemotherapy [facility]) This is stable since recent dose reduction We will continue to monitor that closely.   Other constipation She has chronic intermittent constipation This could be exacerbated by Cymbalta and I recommend her to continue on stool softeners and laxatives  Skin rash She has skin rash of unknown etiology, not present today I recommend she takes a picture the next time it happened For now, I recommend topical emollient over-the-counter medicine as tolerated   Orders Placed This Encounter  Procedures  . CT ABDOMEN PELVIS W CONTRAST    Standing Status:   Future    Standing Expiration Date:   07/06/2019    Order Specific Question:   If indicated for the ordered procedure, I authorize the administration of contrast media per Radiology protocol    Answer:   Yes    Order Specific Question:   Preferred imaging location?    Answer:   [facility]    Order Specific Question:   Radiology Contrast Protocol - do NOT remove file path    Answer:   \\[email or web address]  . CT CHEST W CONTRAST    Standing Status:   Future    Standing Expiration Date:   07/06/2019    Order Specific Question:   If indicated for the ordered procedure, I authorize the administration of contrast media per Radiology protocol    Answer:   Yes    Order Specific Question:   Preferred imaging location?    Answer:   [facility]    Order Specific Question:   Radiology Contrast Protocol - do NOT remove file path    Answer:   \\[email or web address]    INTERVAL HISTORY: Please see below for problem oriented charting. She returns with her daughters for further follow-up She has multiple complaints but overall, his symptoms are stable She has unspecified skin rash comes and goes She has persistent tingling sensation with neuropathy but they are not worse No recent infection, fever or chills She has chronic stable constipation, resolved with laxatives  SUMMARY OF ONCOLOGIC HISTORY: Oncology History   High grade serous     Ovarian cancer ([place])   01/02/2018 Imaging    1. Large complex left paramidline pelvic mass likely rises from the left ovary. Together with left periaortic adenopathy and peritoneal carcinomatosis, findings are worrisome for metastatic ovarian cancer. 2. Small pelvic free fluid. 3.  Aortic atherosclerosis (ICD10-170.0).    01/21/2018 Pathology Results    Omentum, biopsy, dominant omental nodule within the right lower abdominal quadrant - POORLY DIFFERENTIATED CARCINOMA CONSISTENT WITH GYNECOLOGIC PRIMARY. - SEE MICROSCOPIC DESCRIPTION. Microscopic Comment The tumor is characterized by sheets of tumor cells with enlarged nuclei, many containing prominent nucleoli. There are patchy areas with eosinophilic cytoplasmic inclusions. Immunohistochemistry shows positivity with cytokeratin AE1/AE3, cytokeratin 7, estrogen receptor, progesterone receptor, PAX-8, WT-1, and placental alkaline phosphatase (PLAP). The tumor is negative with alpha fetoprotein, human chorionic gonadotropin, CD117, CD10, CD30, CD56, CDX-2, CEA, cytokeratin 20, S100, SOX-10, synaptophysin, chromogranin, D2-40, GATA-3, GCDFP,  Glypican 3 and Inhibin. 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 The estrogen receptor is 100% with strong staining and  the progesterone receptor is 90% with strong staining.    01/21/2018 Procedure    Technically successful CT guided core needle biopsy of dominant omental nodule within the right lower abdominal quadrant, adjacent to the cecum.    01/24/2018 Cancer Staging    Staging form: Ovary, Fallopian Tube, and Primary Peritoneal Carcinoma, AJCC 8th Edition - Clinical: Stage IIIC (cT3c, cN1b, cM0) - Signed by [name], MD on 01/24/2018    01/25/2018 Tumor Marker    Patient's tumor was tested for the following markers: AFP Results of the tumor marker test revealed 4.7    01/31/2018 Surgery    Pre-operative Diagnosis: Ovarian cancer NOS  Post-operative Diagnosis: At least Stage 3 ovarian cancer   Operation: Laparoscopic omental biopsy  Surgeon: Mart Piggs, MD  Specimens: Omentum  Operative Findings: Omental nodule >2cm. Large left pelvic mass 12-15cm with sigmoid draped medially and potentially adherent to colon. The mass appeared to have a 3cm cystic area that was ruptured preoperatively. Uterus appears normal with small anterior fibroid ~1cm. Right adnexa nonenlarged. Small bowel without evidence of disease. No diaphragmatic disease.     01/31/2018 Pathology Results    Omentum, resection for tumor - HIGH GRADE CARCINOMA. - SEE MICROSCOPIC DESCRIPTION. Microscopic Comment This case is sent to Dr. Annitta Jersey for consultation. The case was discussed with Dr. Gerarda Fraction on 02/01/18. (JDP:kh 02/01/18) The omentum shows nodules of high grade carcinoma characterized by diffuse sheets of malignant cells with enlarged nuclei with prominent nucleoli. There is moderate to abundant eosinophilic cytoplasm and frequent mitotic figures. 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 The entire specimen is examined histologically and there are nodules of tumor throughout the specimen with identical morphology. Additional immunohistochemistry for hepatoid markers will be performed and reported as an addendum    02/11/2018 Procedure    Status post right IJ port catheter placement. Catheter ready for use.    02/14/2018 Tumor Marker    Patient's tumor was tested for the following markers: CA-125 Results of the tumor marker test revealed 1523    02/15/2018 -  Chemotherapy    The patient had carboplatin and taxol. Taxol dose reduced from cycle 2 onwards    02/23/2018 Imaging    Interval development of abdominal ascites.  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  No significant change in multilobulated complex left pelvic mass, which compresses the pelvic structures and displaces them to the right.  Interval development of mild right hydronephrosis and hydroureter, possibly postobstructive.    03/08/2018 Tumor Marker    Patient's tumor was tested for the following markers: CA-125 Results of the tumor marker test revealed 1844    03/29/2018 Tumor Marker    Patient's tumor was tested for the following markers: CA-125 Results of the tumor marker test revealed 1141    04/10/2018 Imaging    1. New omental nodule. Otherwise, left ovarian mass and right paracolic gutter nodule have decreased in size in the interval. 2. Small ascites, increased. 3. Endometrial cavity may be dilated and contains fluid. Please correlate clinically. 4. Mild prominence of the ureters  bilaterally, possibly due to large left adnexal mass. 5. Aortic atherosclerosis (ICD10-170.0). Coronary artery calcification.    04/25/2018 Surgery    Procedure(s) Performed:   1. Exploratory laparotomy  2. TAH/Bilateral salpingo-oophorectomy with radical tumor debulking for ovarian cancer . 3. Omentectomy  Surgeon: Bernita Raisin,  MD Specimens: Bilateral tubes and ovaries, omentum, sigmoid nodule, cecal nodule   Operative Findings: Large left sided ovarian tumor wedged retroperitoneal and inferiorly into pelvis with some adherence to sigmoid mesentary. Some tumor nodules on surface of sigmoid epiploica that was removed. ~1-2 cm tumor implant lateral to cecum also excised. No obvious omental disease. This represented an optimal cytoreduction (R0) with no gross visible disease remaining. No carcinomatosis. Diaphragms and small bowel /mesentary all free of disease.     04/25/2018 Pathology Results    1. 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 - PRESUMED OVARIAN SURFACE INVOLVEMENT. - DEFINITIVE FALLOPIAN TUBE NOT IDENTIFIED. - SEE COMMENT AND ONCOLOGY TABLE. 2. Uterus and cervix, right tube and ovary - UTERUS: -ENDOMETRIUM: INACTIVE ENDOMETRIUM. NO HYPERPLASIA OR MALIGNANCY. -MYOMETRIUM: LEIOMYOMA. NO MALIGNANCY. -SEROSA: UNREMARKABLE. NO MALIGNANCY. - CERVIX: BENIGN SQUAMOUS AND ENDOCERVICAL MUCOSA. NO DYSPLASIA OR MALIGNANCY. - RIGHT OVARY: UNREMARKABLE. NO MALIGNANCY. - RIGHT FALLOPIAN TUBES: METASTATIC CARCINOMA. 3. Soft tissue, biopsy, sigmoid implant - METASTATIC CARCINOMA. 4. Omentum, resection for tumor - METASTATIC CARCINOMA. 5. Soft tissue, biopsy, pelvic cecal implant - METASTATIC CARCINOMA. Microscopic Comment 1. OVARY or FALLOPIAN TUBE or PRIMARY PERITONEUM: Procedure: Left salpingo-oophorectomy. Hysterectomy and right salpingo-oophorectomy, omentectomy and implant biopsies. Specimen Integrity: Disrupted. Tumor Site: Left ovary. Ovarian Surface Involvement (required only if applicable): Presumed surface involvement, see comment. Fallopian Tube Surface Involvement (required only if applicable): Can not be determined, see comment. Tumor Size: 13.3 cm. Histologic Type: See comment. Histologic Grade: High grade. Implants (required for advanced stage  serous/seromucinous borderline tumors only): Present. Other Tissue/ Organ Involvement: Right fallopian tube, sigmoid implant, omental implant, cecal implant. Largest Extrapelvic Peritoneal Focus (required only if applicable): 1.6 cm. Peritoneal/Ascitic Fluid: N/A. Treatment Effect (required only for high-grade serous carcinomas): Definitive effect not seen. Regional Lymph Nodes: No lymph nodes submitted or found. Pathologic Stage Classification (pTNM, AJCC 8th Edition): ypT3b, ypNX Representative Tumor Block: 1A-F Comment(s): Residual ovarian parenchyma is not identified and the entire mass consists of tumor, thus, surface involvement is presumably present. The left fallopian tube is not grossly or microscopically identified and thus involvement can not be assessed. A prior omental biopsy was sent to Dr. Annitta Jersey for consultation and was diagnosed as high grade carcinoma with hepatoid features. The current tumor is compared to the biopsy and looks morphologically identical.    05/06/2018 Tumor Marker    Patient's tumor was tested for the following markers: CA-125 Results of the tumor marker test revealed 190    06/10/2018 Tumor Marker    Patient's tumor was tested for the following markers: AFP Results of the tumor marker test revealed 16.4     REVIEW OF SYSTEMS:   Constitutional: Denies fevers, chills or abnormal weight loss Eyes: Denies blurriness of vision Ears, nose, mouth, throat, and face: Denies mucositis or sore throat Respiratory: Denies cough, dyspnea or wheezes Cardiovascular: Denies palpitation, chest discomfort or lower extremity swelling Lymphatics: Denies new lymphadenopathy or easy bruising Behavioral/Psych: Mood is stable, no new changes  All other systems were reviewed with the patient and are negative.  I have reviewed the past medical history, past surgical history, social history and family history with the patient and  they are unchanged from previous  note.  ALLERGIES:  has No Known Allergies.  MEDICATIONS:  Current Outpatient Medications  Medication Sig Dispense Refill  . acetaminophen (TYLENOL) 500 MG tablet Take 500 mg by mouth every 8 (eight) hours as needed for mild pain or headache.     Marland Kitchen amLODipine (NORVASC) 5 MG tablet Take 5 mg by mouth at bedtime.     . fluticasone (FLONASE) 50 MCG/ACT nasal spray Place 2 sprays into both nostrils 2 (two) times daily. (Patient taking differently: Place 2 sprays into both nostrils as needed. ) 16 g 2  . levothyroxine (SYNTHROID, LEVOTHROID) 88 MCG tablet Take 88 mcg by mouth daily before breakfast.    . loratadine (CLARITIN) 10 MG tablet Take 10 mg by mouth daily as needed for allergies.     . metoprolol succinate (TOPROL-XL) 50 MG 24 hr tablet Take 50 mg by mouth every morning.     . Multiple Vitamins-Minerals (MULTIVITAMINS THER. W/MINERALS) TABS Take 1 tablet by mouth daily.      . naphazoline-pheniramine (NAPHCON-A) 0.025-0.3 % ophthalmic solution Place 1 drop into both eyes daily as needed for eye irritation.    Marland Kitchen olmesartan (BENICAR) 40 MG tablet Take 40 mg by mouth at bedtime.     . senna-docusate (SENOKOT-S) 8.6-50 MG tablet Take 1 tablet by mouth at bedtime. Hold for loose stools (Patient taking differently: Take 1 tablet by mouth as needed. Hold for loose stools) 30 tablet 1   No current facility-administered medications for this visit.    Facility-Administered Medications Ordered in Other Visits  Medication Dose Route Frequency Provider Last Rate Last Dose  . sodium chloride flush (NS) 0.9 % injection 10 mL  10 mL Intracatheter PRN Alvy Bimler, Selene Peltzer, MD   10 mL at 07/05/18 1612    PHYSICAL EXAMINATION: ECOG PERFORMANCE STATUS: 1 - Symptomatic but completely ambulatory  Vitals:   07/05/18 0944  BP: (!) 155/84  Pulse: (!) 112  Resp: 18  Temp: 97.6 F (36.4 C)  SpO2: 95%   Filed Weights   07/05/18 0944  Weight: 159 lb 9.6 oz (72.4 kg)    GENERAL:alert, no distress and  comfortable SKIN: skin color, texture, turgor are normal, no rashes or significant lesions EYES: normal, Conjunctiva are pink and non-injected, sclera clear OROPHARYNX:no exudate, no erythema and lips, buccal mucosa, and tongue normal  NECK: supple, thyroid normal size, non-tender, without nodularity LYMPH:  no palpable lymphadenopathy in the cervical, axillary or inguinal LUNGS: clear to auscultation and percussion with normal breathing effort HEART: regular rate & rhythm and no murmurs and no lower extremity edema ABDOMEN:abdomen soft, non-tender and normal bowel sounds Musculoskeletal:no cyanosis of digits and no clubbing  NEURO: alert & oriented x 3 with fluent speech, no focal motor/sensory deficits  LABORATORY DATA:  I have reviewed the data as listed    Component Value Date/Time   NA 138 07/05/2018 0918   K 4.0 07/05/2018 0918   CL 105 07/05/2018 0918   CO2 20 (L) 07/05/2018 0918   GLUCOSE 249 (H) 07/05/2018 0918   BUN 25 (H) 07/05/2018 0918   CREATININE 1.03 (H) 07/05/2018 0918   CALCIUM 9.7 07/05/2018 0918   PROT 8.0 07/05/2018 0918   ALBUMIN 3.9 07/05/2018 0918   AST 22 07/05/2018 0918   ALT 22 07/05/2018 0918   ALKPHOS 105 07/05/2018 0918   BILITOT 0.4 07/05/2018 0918   GFRNONAA 50 (L) 07/05/2018 0918   GFRAA 58 (L) 07/05/2018 0918    No results found for: SPEP,  UPEP  Lab Results  Component Value Date   WBC 8.8 07/05/2018   NEUTROABS 7.3 07/05/2018   HGB 12.7 07/05/2018   HCT 38.5 07/05/2018   MCV 87.1 07/05/2018   PLT 280 07/05/2018      Chemistry      Component Value Date/Time   NA 138 07/05/2018 0918   K 4.0 07/05/2018 0918   CL 105 07/05/2018 0918   CO2 20 (L) 07/05/2018 0918   BUN 25 (H) 07/05/2018 0918   CREATININE 1.03 (H) 07/05/2018 0918      Component Value Date/Time   CALCIUM 9.7 07/05/2018 0918   ALKPHOS 105 07/05/2018 0918   AST 22 07/05/2018 0918   ALT 22 07/05/2018 0918   BILITOT 0.4 07/05/2018 0918       All questions were  answered. The patient knows to call the clinic with any problems, questions or concerns. No barriers to learning was detected.  I spent 15 minutes counseling the patient face to face. The total time spent in the appointment was 20 minutes and more than 50% was on counseling and review of test results  [name], MD 07/05/2018 5:47 PM

## 2018-07-05 NOTE — Patient Instructions (Signed)

## 2018-07-05 NOTE — Assessment & Plan Note (Signed)
This is stable since recent dose reduction We will continue to monitor that closely.

## 2018-07-05 NOTE — Assessment & Plan Note (Signed)
The patient has mild stable neuropathy, along with other mild nonspecific complaints with skin rashes and constipation. I plan to continue on reduced dose Taxol I recommend genetic counselor referral for genetic testing/HRD mutation study, appointment pending We will complete final treatment today and a plan to repeat imaging study next month for further follow-up

## 2018-07-05 NOTE — Assessment & Plan Note (Signed)
She has skin rash of unknown etiology, not present today I recommend she takes a picture the next time it happened For now, I recommend topical emollient over-the-counter medicine as tolerated

## 2018-07-05 NOTE — Progress Notes (Signed)
Per Dr Alvy Bimler OK to treat with elevated heart rate and CRT.  NO IVFs at this time.

## 2018-07-05 NOTE — Patient Instructions (Signed)
[facility] Discharge Instructions for Patients Receiving Chemotherapy  Today you received the following chemotherapy agents:  Taxol, Carbo  To help prevent nausea and vomiting after your treatment, we encourage you to take your nausea medication as prescribed.   If you develop nausea and vomiting that is not controlled by your nausea medication, call the clinic.   BELOW ARE SYMPTOMS THAT SHOULD BE REPORTED IMMEDIATELY:  *FEVER GREATER THAN 100.5 F  *CHILLS WITH OR WITHOUT FEVER  NAUSEA AND VOMITING THAT IS NOT CONTROLLED WITH YOUR NAUSEA MEDICATION  *UNUSUAL SHORTNESS OF BREATH  *UNUSUAL BRUISING OR BLEEDING  TENDERNESS IN MOUTH AND THROAT WITH OR WITHOUT PRESENCE OF ULCERS  *URINARY PROBLEMS  *BOWEL PROBLEMS  UNUSUAL RASH Items with * indicate a potential emergency and should be followed up as soon as possible.  Feel free to call the clinic should you have any questions or concerns. The clinic phone number is [phone].  Please show the CHEMO ALERT CARD at check-in to the Emergency Department and triage nurse.   

## 2018-07-05 NOTE — Assessment & Plan Note (Signed)
She has chronic intermittent constipation This could be exacerbated by Cymbalta and I recommend her to continue on stool softeners and laxatives

## 2018-07-05 NOTE — Telephone Encounter (Signed)
Gave avs and calendar ° °

## 2018-07-06 LAB — CA 125: Cancer Antigen (CA) 125: 13.4 U/mL (ref 0.0–38.1)

## 2018-07-15 ENCOUNTER — Inpatient Hospital Stay: Payer: Medicare Other

## 2018-07-15 ENCOUNTER — Encounter: Payer: Self-pay | Admitting: Genetic Counselor

## 2018-07-15 ENCOUNTER — Telehealth: Payer: Self-pay | Admitting: Genetic Counselor

## 2018-07-15 ENCOUNTER — Inpatient Hospital Stay (HOSPITAL_BASED_OUTPATIENT_CLINIC_OR_DEPARTMENT_OTHER): Payer: Medicare Other | Admitting: Genetic Counselor

## 2018-07-15 DIAGNOSIS — C562 Malignant neoplasm of left ovary: Secondary | ICD-10-CM

## 2018-07-15 DIAGNOSIS — Z86010 Personal history of colonic polyps: Secondary | ICD-10-CM | POA: Diagnosis not present

## 2018-07-15 DIAGNOSIS — C569 Malignant neoplasm of unspecified ovary: Secondary | ICD-10-CM | POA: Diagnosis not present

## 2018-07-15 DIAGNOSIS — Z803 Family history of malignant neoplasm of breast: Secondary | ICD-10-CM | POA: Diagnosis not present

## 2018-07-15 DIAGNOSIS — Z1379 Encounter for other screening for genetic and chromosomal anomalies: Secondary | ICD-10-CM

## 2018-07-15 DIAGNOSIS — Z808 Family history of malignant neoplasm of other organs or systems: Secondary | ICD-10-CM

## 2018-07-15 DIAGNOSIS — Z90710 Acquired absence of both cervix and uterus: Secondary | ICD-10-CM | POA: Diagnosis not present

## 2018-07-15 DIAGNOSIS — Z8042 Family history of malignant neoplasm of prostate: Secondary | ICD-10-CM | POA: Insufficient documentation

## 2018-07-15 DIAGNOSIS — Z90722 Acquired absence of ovaries, bilateral: Secondary | ICD-10-CM | POA: Diagnosis not present

## 2018-07-15 DIAGNOSIS — Z9221 Personal history of antineoplastic chemotherapy: Secondary | ICD-10-CM | POA: Diagnosis not present

## 2018-07-15 LAB — CMP (CANCER CENTER ONLY)
ALT: 22 U/L (ref 0–44)
ANION GAP: 6 (ref 5–15)
AST: 21 U/L (ref 15–41)
Albumin: 3.8 g/dL (ref 3.5–5.0)
Alkaline Phosphatase: 87 U/L (ref 38–126)
BUN: 13 mg/dL (ref 8–23)
CO2: 28 mmol/L (ref 22–32)
Calcium: 9.8 mg/dL (ref 8.9–10.3)
Chloride: 105 mmol/L (ref 98–111)
Creatinine: 0.72 mg/dL (ref 0.44–1.00)
GFR, Est AFR Am: >60 mL/min (ref >60)
GFR, Estimated: >60 mL/min (ref >60)
Glucose, Bld: 122 mg/dL — ABNORMAL HIGH (ref 70–99)
Potassium: 4.3 mmol/L (ref 3.5–5.1)
Sodium: 139 mmol/L (ref 135–145)
TOTAL PROTEIN: 7.3 g/dL (ref 6.5–8.1)
Total Bilirubin: 0.3 mg/dL (ref 0.3–1.2)

## 2018-07-15 LAB — CBC WITH DIFFERENTIAL (CANCER CENTER ONLY)
Abs Immature Granulocytes: 0.02 10*3/uL (ref 0.00–0.07)
BASOS PCT: 1 %
Basophils Absolute: 0 10*3/uL (ref 0.0–0.1)
Eosinophils Absolute: 0 10*3/uL (ref 0.0–0.5)
Eosinophils Relative: 1 %
HCT: 35.6 % — ABNORMAL LOW (ref 36.0–46.0)
Hemoglobin: 11.4 g/dL — ABNORMAL LOW (ref 12.0–15.0)
Immature Granulocytes: 1 %
Lymphocytes Relative: 42 %
Lymphs Abs: 1.2 10*3/uL (ref 0.7–4.0)
MCH: 28.7 pg (ref 26.0–34.0)
MCHC: 32 g/dL (ref 30.0–36.0)
MCV: 89.7 fL (ref 80.0–100.0)
Monocytes Absolute: 0.2 10*3/uL (ref 0.1–1.0)
Monocytes Relative: 9 %
NEUTROS PCT: 46 %
Neutro Abs: 1.3 10*3/uL — ABNORMAL LOW (ref 1.7–7.7)
PLATELETS: 238 10*3/uL (ref 150–400)
RBC: 3.97 MIL/uL (ref 3.87–5.11)
RDW: 14 % (ref 11.5–15.5)
WBC Count: 2.8 10*3/uL — ABNORMAL LOW (ref 4.0–10.5)
nRBC: 0 % (ref 0.0–0.2)

## 2018-07-15 NOTE — Telephone Encounter (Signed)
Error

## 2018-07-15 NOTE — Progress Notes (Signed)
REFERRING PROVIDER: Marton Redwood, MD [address] [place], [place] [address]  PRIMARY PROVIDER:  Marton Redwood, MD  PRIMARY REASON FOR VISIT:  1. Family history of breast cancer   2. Family history of prostate cancer   3. Family history of melanoma      HISTORY OF PRESENT ILLNESS:   Shannon Ellis, a 85 y.o. female, was seen for a [facility] cancer genetics consultation at the request of Dr. Brigitte Pulse due to a personal and family history of cancer.  Shannon Ellis presents to clinic today to discuss the possibility of a hereditary predisposition to cancer, genetic testing, and to further clarify her future cancer risks, as well as potential cancer risks for family members.   In July 2019, at the age of 3, Shannon Ellis was diagnosed with ovarian cancer. This was treated with surgery in November 2019, and chemotherapy.      CANCER HISTORY:  Oncology History   High grade serous     Ovarian cancer ([place])   01/02/2018 Imaging    1. Large complex left paramidline pelvic mass likely rises from the left ovary. Together with left periaortic adenopathy and peritoneal carcinomatosis, findings are worrisome for metastatic ovarian cancer. 2. Small pelvic free fluid. 3.  Aortic atherosclerosis (ICD10-170.0).    01/21/2018 Pathology Results    Omentum, biopsy, dominant omental nodule within the right lower abdominal quadrant - POORLY DIFFERENTIATED CARCINOMA CONSISTENT WITH GYNECOLOGIC PRIMARY. - SEE MICROSCOPIC DESCRIPTION. Microscopic Comment The tumor is characterized by sheets of tumor cells with enlarged nuclei, many containing prominent nucleoli. There are patchy areas with eosinophilic cytoplasmic inclusions. Immunohistochemistry shows positivity with cytokeratin AE1/AE3, cytokeratin 7, estrogen receptor, progesterone receptor, PAX-8, WT-1, and placental alkaline phosphatase (PLAP). The tumor is negative with alpha fetoprotein, human chorionic gonadotropin, CD117, CD10, CD30, CD56, CDX-2, CEA,  cytokeratin 20, S100, SOX-10, synaptophysin, chromogranin, D2-40, GATA-3, GCDFP, Glypican 3 and Inhibin. 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 The estrogen receptor is 100% with strong staining and the progesterone receptor is 90% with strong staining.    01/21/2018 Procedure    Technically successful CT guided core needle biopsy of dominant omental nodule within the right lower abdominal quadrant, adjacent to the cecum.    01/24/2018 Cancer Staging    Staging form: Ovary, Fallopian Tube, and Primary Peritoneal Carcinoma, AJCC 8th Edition - Clinical: Stage IIIC (cT3c, cN1b, cM0) - Signed by [name], MD on 01/24/2018    01/25/2018 Tumor Marker    Patient's tumor was tested for the following markers: AFP Results of the tumor marker test revealed 4.7    01/31/2018 Surgery    Pre-operative Diagnosis: Ovarian cancer NOS  Post-operative Diagnosis: At least Stage 3 ovarian cancer   Operation: Laparoscopic omental biopsy  Surgeon: Mart Piggs, MD  Specimens: Omentum  Operative Findings: Omental nodule >2cm. Large left pelvic mass 12-15cm with sigmoid draped medially and potentially adherent to colon. The mass appeared to have a 3cm cystic area that was ruptured preoperatively. Uterus appears normal with small anterior fibroid ~1cm. Right adnexa nonenlarged. Small bowel without evidence of disease. No diaphragmatic disease.     01/31/2018 Pathology Results    Omentum, resection for tumor - HIGH GRADE CARCINOMA. - SEE MICROSCOPIC DESCRIPTION. Microscopic Comment This case is sent to Dr. Annitta Jersey for consultation. The case was discussed with Dr. Gerarda Fraction on 02/01/18. (JDP:kh 02/01/18) The omentum shows nodules of high grade carcinoma characterized by diffuse sheets of malignant cells with enlarged nuclei with  prominent nucleoli. There is moderate to abundant eosinophilic cytoplasm and frequent mitotic figures. This case was  sent to Dr Annitta Jersey at 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 The entire specimen is examined histologically and there are nodules of tumor throughout the specimen with identical morphology. Additional immunohistochemistry for hepatoid markers will be performed and reported as an addendum    02/11/2018 Procedure    Status post right IJ port catheter placement. Catheter ready for use.    02/14/2018 Tumor Marker    Patient's tumor was tested for the following markers: CA-125 Results of the tumor marker test revealed 1523    02/15/2018 -  Chemotherapy    The patient had carboplatin and taxol. Taxol dose reduced from cycle 2 onwards    02/23/2018 Imaging    Interval development of abdominal ascites.  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  No significant change in multilobulated complex left pelvic mass, which compresses the pelvic structures and displaces them to the right.  Interval development of mild right hydronephrosis and hydroureter, possibly postobstructive.    03/08/2018 Tumor Marker    Patient's tumor was tested for the following markers: CA-125 Results of the tumor marker test revealed 1844    03/29/2018 Tumor Marker    Patient's tumor was tested for the following markers: CA-125 Results of the tumor marker test revealed 1141    04/10/2018 Imaging    1. New omental nodule. Otherwise, left ovarian mass and right paracolic gutter nodule have decreased in size in the interval. 2. Small ascites, increased. 3. Endometrial cavity may be dilated and contains fluid. Please correlate clinically. 4. Mild prominence of the ureters bilaterally, possibly due to large left adnexal mass. 5. Aortic atherosclerosis (ICD10-170.0).  Coronary artery calcification.    04/25/2018 Surgery    Procedure(s) Performed:   1. Exploratory laparotomy  2. TAH/Bilateral salpingo-oophorectomy with radical tumor debulking for ovarian cancer . 3. Omentectomy  Surgeon: Bernita Raisin, MD Specimens: Bilateral tubes and ovaries, omentum, sigmoid nodule, cecal nodule   Operative Findings: Large left sided ovarian tumor wedged retroperitoneal and inferiorly into pelvis with some adherence to sigmoid mesentary. Some tumor nodules on surface of sigmoid epiploica that was removed. ~1-2 cm tumor implant lateral to cecum also excised. No obvious omental disease. This represented an optimal cytoreduction (R0) with no gross visible disease remaining. No carcinomatosis. Diaphragms and small bowel /mesentary all free of disease.     04/25/2018 Pathology Results    1. 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 - PRESUMED OVARIAN SURFACE INVOLVEMENT. - DEFINITIVE FALLOPIAN TUBE NOT IDENTIFIED. - SEE COMMENT AND ONCOLOGY TABLE. 2. Uterus and cervix, right tube and ovary - UTERUS: -ENDOMETRIUM: INACTIVE ENDOMETRIUM. NO HYPERPLASIA OR MALIGNANCY. -MYOMETRIUM: LEIOMYOMA. NO MALIGNANCY. -SEROSA: UNREMARKABLE. NO MALIGNANCY. - CERVIX: BENIGN SQUAMOUS AND ENDOCERVICAL MUCOSA. NO DYSPLASIA OR MALIGNANCY. - RIGHT OVARY: UNREMARKABLE. NO MALIGNANCY. - RIGHT FALLOPIAN TUBES: METASTATIC CARCINOMA. 3. Soft tissue, biopsy, sigmoid implant - METASTATIC CARCINOMA. 4. Omentum, resection for tumor - METASTATIC CARCINOMA. 5. Soft tissue, biopsy, pelvic cecal implant - METASTATIC CARCINOMA. Microscopic Comment 1. OVARY or FALLOPIAN TUBE or PRIMARY PERITONEUM: Procedure: Left salpingo-oophorectomy. Hysterectomy and right salpingo-oophorectomy, omentectomy and implant biopsies. Specimen Integrity: Disrupted. Tumor Site: Left ovary. Ovarian Surface Involvement (required only if applicable): Presumed  surface  involvement, see comment. Fallopian Tube Surface Involvement (required only if applicable): Can not be determined, see comment. Tumor Size: 13.3 cm. Histologic Type: See comment. Histologic Grade: High grade. Implants (required for advanced stage serous/seromucinous borderline tumors only): Present. Other Tissue/ Organ Involvement: Right fallopian tube, sigmoid implant, omental implant, cecal implant. Largest Extrapelvic Peritoneal Focus (required only if applicable): 1.6 cm. Peritoneal/Ascitic Fluid: N/A. Treatment Effect (required only for high-grade serous carcinomas): Definitive effect not seen. Regional Lymph Nodes: No lymph nodes submitted or found. Pathologic Stage Classification (pTNM, AJCC 8th Edition): ypT3b, ypNX Representative Tumor Block: 1A-F Comment(s): Residual ovarian parenchyma is not identified and the entire mass consists of tumor, thus, surface involvement is presumably present. The left fallopian tube is not grossly or microscopically identified and thus involvement can not be assessed. A prior omental biopsy was sent to Dr. Annitta Jersey for consultation and was diagnosed as high grade carcinoma with hepatoid features. The current tumor is compared to the biopsy and looks morphologically identical.    05/06/2018 Tumor Marker    Patient's tumor was tested for the following markers: CA-125 Results of the tumor marker test revealed 190    06/10/2018 Tumor Marker    Patient's tumor was tested for the following markers: CA-125 Results of the tumor marker test revealed 16.4    07/05/2018 Tumor Marker    Patient's tumor was tested for the following markers: CA-125 Results of the tumor marker test revealed 13.4      HORMONAL RISK FACTORS:  Menarche was at age 38.  First live birth at age 57.  OCP use for approximately 2-3 years.  Ovaries intact: no.  Hysterectomy: yes.  Menopausal status: postmenopausal.  HRT use: 0 years. Colonoscopy: yes; one polyp. Mammogram  within the last year: no. Number of breast biopsies: 0. Up to date with pelvic exams:  yes. Any excessive radiation exposure in the past:  no  Past Medical History:  Diagnosis Date  . Allergy   . Anemia   . Blood transfusion without reported diagnosis   . Cancer ([place])   . Complication of anesthesia    waking up-does not take much medication  . Family history of adverse reaction to anesthesia    problems waking up as does not need much medication  . Family history of breast cancer   . Family history of melanoma   . Family history of prostate cancer   . Hypertension   . Hypothyroidism   . Osteoporosis 10/07   Dexa scan  . PONV (postoperative nausea and vomiting)     Past Surgical History:  Procedure Laterality Date  . APPENDECTOMY  1950   not ruptured  . DEBULKING N/A 04/25/2018   Procedure: DEBULKING;  Surgeon: [name], MD;  Location: [facility];  Service: Gynecology;  Laterality: N/A;  . HYSTERECTOMY ABDOMINAL WITH SALPINGO-OOPHORECTOMY Bilateral 04/25/2018   Procedure: HYSTERECTOMY ABDOMINAL WITH BILATERAL SALPINGO-OOPHORECTOMY;  Surgeon: [name], MD;  Location: [facility];  Service: Gynecology;  Laterality: Bilateral;  . IR IMAGING GUIDED PORT INSERTION  02/11/2018  . LAPAROSCOPY N/A 01/31/2018   Procedure: LAPAROSCOPY DIAGNOSTIC WITH BIOPSY;  Surgeon: [name], MD;  Location: [facility];  Service: Gynecology;  Laterality: N/A;  . OMENTECTOMY N/A 04/25/2018   Procedure: OMENTECTOMY;  Surgeon: [name], MD;  Location: [facility];  Service: Gynecology;  Laterality: N/A;  . ovarian biopsy    . THYROIDECTOMY    . TONSILLECTOMY    . TOTAL HIP ARTHROPLASTY Right  Social History   Socioeconomic History  . Marital status: Widowed    Spouse name: Not on file  . Number of children: 4  . Years of education: Not on file  . Highest education level: Not on file  Occupational History  . 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  Financial resource strain: Not on file  . Food insecurity:    Worry: Not on file    Inability: Not on file  . Transportation needs:    Medical: Not on file    Non-medical: Not on file  Tobacco Use  . Smoking status: Never Smoker  . Smokeless tobacco: Never Used  Substance and Sexual Activity  . Alcohol use: No    Alcohol/week: 0.0 standard drinks  . Drug use: No  . Sexual activity: Not Currently  Lifestyle  . Physical activity:    Days per week: Not on file    Minutes per session: Not on file  . Stress: Not on file  Relationships  . Social connections:    Talks on phone: Not on file    Gets together: Not on file    Attends religious service: Not on file    Active member of club or organization: Not on file    Attends meetings of clubs or organizations: Not on file    Relationship status: Not on file  Other Topics Concern  . Not on file  Social History Narrative  . Not on file     FAMILY HISTORY:  We obtained a detailed, 4-generation family history.  Significant diagnoses are listed below: Family History  Problem Relation Age of Onset  . Diabetes Mother   . Stroke Mother   . Breast cancer Sister 28       breast ca  . Diabetes Sister   . Lung cancer Brother   . Stroke Brother   . Asthma Maternal Grandmother   . Melanoma Grandson 20  . Prostate cancer Nephew   . Cancer Cousin        mat first cousin, unknown cancer    The patient has four children who are cancer free.  She has one grandson who had melanoma at age 59.  She has eight siblings. One sister had breast cancer at 40.  This sister had three daughters, one who had genetic testing that was negative.  She had a brother who died of lung cancer.  He had a son with prostate cancer.  Both parents are deceased.  The patient's mother was one of 10 siblings.  One aunt is living at 58, and several cousins had cancer, but she is unsure of the type.  There is no other reported family history of cancer.  The patient's  father is deceased.  He was one of 6 full siblings, and four paternal half siblings.  None reportedly had cancer.  The paternal grandparents are deceased.  Ms. Wallander is aware of previous family history of genetic testing for hereditary cancer risks in her niece. Patient's maternal ancestors are of Caucasian descent, and paternal ancestors are of Caucasian descent. There is no reported Ashkenazi Jewish ancestry. There is no known consanguinity.  GENETIC COUNSELING ASSESSMENT: VERLEEN STUCKEY is a 85 y.o. female with a personal and family history of cancer which is somewhat suggestive of a hereditary cancer syndrome and predisposition to cancer. We, therefore, discussed and recommended the following at today's visit.   DISCUSSION: We disucssed that about 15-20% of ovarian  cancer were hereditary with most cases due to BRCA mutations.  There are other genes that can increase the risk for ovarian cancer, as well as other cancers.  In addition to blood testing we can test the tumor for mutations that would make the patient eligible for PARP inhibitors.  We discussed that genetic testing through tumor testing will test for hereditary mutations that could explain her diagnosis of cancer.  However, homologous recombination testing (HRD) is genetic testing performed on her tumor that can determine genetic changes that could influence her management.  HRD testing is performed in tandem with genetic testing, and typically at no additional cost.    We reviewed the characteristics, features and inheritance patterns of hereditary cancer syndromes. We also discussed genetic testing, including the appropriate family members to test, the process of testing, insurance coverage and turn-around-time for results. We discussed the implications of a negative, positive and/or variant of uncertain significant result. We recommended Ms. Riggin pursue genetic testing for the TumorNext-HRD plus CancerNext gene panel. The CancerNext  gene panel offered by [employer] includes sequencing and rearrangement analysis for the following 34 genes:   APC, ATM, BARD1, BMPR1A, BRCA1, BRCA2, BRIP1, CDH1, CDK4, CDKN2A, CHEK2, DICER1, HOXB13, EPCAM, GREM1, MLH1, MRE11A, MSH2, MSH6, MUTYH, NBN, NF1, PALB2, PMS2, POLD1, POLE, PTEN, RAD50, RAD51C, RAD51D, SMAD4, SMARCA4, STK11, and TP53.    Based on Ms. Seymore's personal and family history of cancer, she meets medical criteria for genetic testing. Despite that she meets criteria, she may still have an out of pocket cost. We discussed that if her out of pocket cost for testing is over $100, the laboratory will call and confirm whether she wants to proceed with testing.  If the out of pocket cost of testing is less than $100 she will be billed by the genetic testing laboratory.   PLAN: After considering the risks, benefits, and limitations, Ms. Carra  provided informed consent to pursue genetic testing and the blood sample was sent to [facility] for analysis of the TumorNext-HRD with CancerNext. Results should be available within approximately 3-4 weeks' time, at which point they will be disclosed by telephone to Ms. Weidler, as will any additional recommendations warranted by these results. Ms. Ringley will receive a summary of her genetic counseling visit and a copy of her results once available. This information will also be available in Epic. We encouraged Ms. Copen to remain in contact with cancer genetics annually so that we can continuously update the family history and inform her of any changes in cancer genetics and testing that may be of benefit for her family. Ms. Friel questions were answered to her satisfaction today. Our contact information was provided should additional questions or concerns arise.  Lastly, we encouraged Ms. Persad to remain in contact with cancer genetics annually so that we can continuously update the family history and inform her of any  changes in cancer genetics and testing that may be of benefit for this family.   Ms.  Janeway questions were answered to her satisfaction today. Our contact information was provided should additional questions or concerns arise. Thank you for the referral and allowing [place] to share in the care of your patient.   Carroll Lingelbach P. Florene Glen, [place], [facility] Certified Genetic Counselor [email or web address] phone: [phone]  The patient was seen for a total of 60 minutes in face-to-face genetic counseling.  This patient was discussed with Drs. Magrinat, Lindi Adie and/or Burr Medico who agrees with the above.    _______________________________________________________________________ For [facility]  Staff:  Number of people involved in session: 2 Was an Intern/ student involved with case: yes; Floydene Flock

## 2018-07-18 MED FILL — METOPROLOL SUCCINATE ER 50: 50 | 90 days supply | Qty: 90 | Fill #0

## 2018-07-18 MED FILL — AMLODIPINE BESYLATE 5 MG TA: 5 | 30 days supply | Qty: 30 | Fill #1

## 2018-07-22 MED FILL — OLMESARTAN MEDOXOMIL 40 MG: 40 | 90 days supply | Qty: 90 | Fill #0 | Status: TO

## 2018-07-22 MED FILL — SYNTHROID 88 MCG TABLET: 88 | 90 days supply | Qty: 90 | Fill #0 | Status: TO

## 2018-08-01 ENCOUNTER — Ambulatory Visit (HOSPITAL_COMMUNITY)
Admission: RE | Admit: 2018-08-01 | Discharge: 2018-08-01 | Disposition: A | Discharge status: Home / Self Care | Payer: Medicare Other | Source: Ambulatory Visit | Attending: Hematology and Oncology | Admitting: Hematology and Oncology

## 2018-08-01 ENCOUNTER — Encounter (HOSPITAL_COMMUNITY): Payer: Self-pay

## 2018-08-01 ENCOUNTER — Inpatient Hospital Stay: Payer: Medicare Other

## 2018-08-01 ENCOUNTER — Inpatient Hospital Stay: Payer: Medicare Other | Attending: Obstetrics

## 2018-08-01 ENCOUNTER — Telehealth: Payer: Self-pay | Admitting: Oncology

## 2018-08-01 DIAGNOSIS — K449 Diaphragmatic hernia without obstruction or gangrene: Secondary | ICD-10-CM | POA: Insufficient documentation

## 2018-08-01 DIAGNOSIS — G620 Drug-induced polyneuropathy: Secondary | ICD-10-CM | POA: Diagnosis not present

## 2018-08-01 DIAGNOSIS — C569 Malignant neoplasm of unspecified ovary: Secondary | ICD-10-CM | POA: Insufficient documentation

## 2018-08-01 DIAGNOSIS — C786 Secondary malignant neoplasm of retroperitoneum and peritoneum: Secondary | ICD-10-CM | POA: Diagnosis not present

## 2018-08-01 DIAGNOSIS — Z9221 Personal history of antineoplastic chemotherapy: Secondary | ICD-10-CM | POA: Diagnosis not present

## 2018-08-01 DIAGNOSIS — T451X5A Adverse effect of antineoplastic and immunosuppressive drugs, initial encounter: Secondary | ICD-10-CM | POA: Diagnosis not present

## 2018-08-01 DIAGNOSIS — I10 Essential (primary) hypertension: Secondary | ICD-10-CM | POA: Insufficient documentation

## 2018-08-01 DIAGNOSIS — C562 Malignant neoplasm of left ovary: Secondary | ICD-10-CM

## 2018-08-01 DIAGNOSIS — Z79899 Other long term (current) drug therapy: Secondary | ICD-10-CM | POA: Diagnosis not present

## 2018-08-01 DIAGNOSIS — I700 Atherosclerosis of aorta: Secondary | ICD-10-CM | POA: Insufficient documentation

## 2018-08-01 DIAGNOSIS — R911 Solitary pulmonary nodule: Secondary | ICD-10-CM | POA: Diagnosis not present

## 2018-08-01 DIAGNOSIS — K5730 Diverticulosis of large intestine without perforation or abscess without bleeding: Secondary | ICD-10-CM | POA: Insufficient documentation

## 2018-08-01 LAB — CBC WITH DIFFERENTIAL/PLATELET
Abs Immature Granulocytes: 0.08 10*3/uL — ABNORMAL HIGH (ref 0.00–0.07)
Basophils Absolute: 0.1 10*3/uL (ref 0.0–0.1)
Basophils Relative: 1 %
Eosinophils Absolute: 0.1 10*3/uL (ref 0.0–0.5)
Eosinophils Relative: 2 %
HCT: 37.2 % (ref 36.0–46.0)
HEMOGLOBIN: 11.9 g/dL — AB (ref 12.0–15.0)
Immature Granulocytes: 1 %
Lymphocytes Relative: 21 %
Lymphs Abs: 1.6 10*3/uL (ref 0.7–4.0)
MCH: 28.5 pg (ref 26.0–34.0)
MCHC: 32 g/dL (ref 30.0–36.0)
MCV: 89.2 fL (ref 80.0–100.0)
Monocytes Absolute: 0.6 10*3/uL (ref 0.1–1.0)
Monocytes Relative: 8 %
Neutro Abs: 5.2 10*3/uL (ref 1.7–7.7)
Neutrophils Relative %: 67 %
Platelets: 241 10*3/uL (ref 150–400)
RBC: 4.17 MIL/uL (ref 3.87–5.11)
RDW: 15 % (ref 11.5–15.5)
WBC: 7.7 10*3/uL (ref 4.0–10.5)
nRBC: 0 % (ref 0.0–0.2)

## 2018-08-01 LAB — COMPREHENSIVE METABOLIC PANEL
ALT: 28 U/L (ref 0–44)
AST: 26 U/L (ref 15–41)
Albumin: 3.8 g/dL (ref 3.5–5.0)
Alkaline Phosphatase: 94 U/L (ref 38–126)
Anion gap: 6 (ref 5–15)
BUN: 11 mg/dL (ref 8–23)
CO2: 27 mmol/L (ref 22–32)
Calcium: 9.7 mg/dL (ref 8.9–10.3)
Chloride: 107 mmol/L (ref 98–111)
Creatinine, Ser: 0.69 mg/dL (ref 0.44–1.00)
GFR calc Af Amer: >60 mL/min (ref >60)
GFR calc non Af Amer: >60 mL/min (ref >60)
Glucose, Bld: 108 mg/dL — ABNORMAL HIGH (ref 70–99)
Potassium: 3.8 mmol/L (ref 3.5–5.1)
Sodium: 140 mmol/L (ref 135–145)
Total Bilirubin: 0.5 mg/dL (ref 0.3–1.2)
Total Protein: 7.4 g/dL (ref 6.5–8.1)

## 2018-08-01 MED ORDER — IOHEXOL 300 MG/ML  SOLN
100.0000 mL | Freq: Once | INTRAMUSCULAR | Status: AC | PRN
  Administered 2018-08-01: 100 mL via INTRAVENOUS

## 2018-08-01 MED ORDER — SODIUM CHLORIDE 0.9% FLUSH
10.0000 mL | Freq: Once | INTRAVENOUS | Status: AC
  Administered 2018-08-01: 10 mL
  Filled 2018-08-01: qty 10

## 2018-08-01 MED ORDER — HEPARIN SOD (PORK) LOCK FLUSH 100 UNIT/ML IV SOLN
500.0000 [IU] | Freq: Once | INTRAVENOUS | Status: AC
  Administered 2018-08-01: 500 [IU] via INTRAVENOUS

## 2018-08-01 MED ORDER — SODIUM CHLORIDE (PF) 0.9 % IJ SOLN
INTRAMUSCULAR | Status: AC
  Filled 2018-08-01: qty 50

## 2018-08-01 MED ORDER — HEPARIN SOD (PORK) LOCK FLUSH 100 UNIT/ML IV SOLN
INTRAVENOUS | Status: AC
  Administered 2018-08-01: 500 [IU] via INTRAVENOUS
  Filled 2018-08-01: qty 5

## 2018-08-01 NOTE — Telephone Encounter (Signed)
Called Shannon Ellis and asked if her apt for tomorrow, 08/02/18 can be moved to 08/08/18 because her genetic testing is not back yet.  She said that would be fine.  Apt rescheduled for 08/08/18 at 9:30.  She verbalized agreement.  Also discussed that her CT scan from today was good with no evidence of metastatic disease.  Also that her labs from today were good.  She again verbalized agreement.

## 2018-08-02 ENCOUNTER — Inpatient Hospital Stay: Payer: Medicare Other | Admitting: Hematology and Oncology

## 2018-08-02 LAB — CA 125: Cancer Antigen (CA) 125: 12.1 U/mL (ref 0.0–38.1)

## 2018-08-07 ENCOUNTER — Ambulatory Visit: Payer: Self-pay | Admitting: Genetic Counselor

## 2018-08-07 ENCOUNTER — Telehealth: Payer: Self-pay | Admitting: Genetic Counselor

## 2018-08-07 DIAGNOSIS — C569 Malignant neoplasm of unspecified ovary: Secondary | ICD-10-CM

## 2018-08-07 DIAGNOSIS — Z1379 Encounter for other screening for genetic and chromosomal anomalies: Secondary | ICD-10-CM | POA: Insufficient documentation

## 2018-08-07 NOTE — Telephone Encounter (Signed)
Spoke with [employer].  Revealed negative genetic testing.  Discussed that we do not know why her mother has ovarian cancer or why there is cancer in the family. It could be due to a different gene that we are not testing, or maybe our current technology may not be able to pick something up.  It will be important for her to keep in contact with genetics to keep up with whether additional testing may be needed.   Discussed that HRD testing was also negative.  Dr. Alvy Bimler will go over results on Thursday with the family as well.

## 2018-08-07 NOTE — Telephone Encounter (Signed)
-----   Message from Clarene Essex, Counselor sent at 07/15/2018 12:38 PM EST ----- Call daughter [name] [phone]

## 2018-08-07 NOTE — Progress Notes (Signed)
HPI:  [name] was previously seen in the [place] clinic due to a personal and family history of cancer and concerns regarding a hereditary predisposition to cancer. Please refer to our prior cancer genetics clinic note for more information regarding [name]'s medical, social and family histories, and our assessment and recommendations, at the time. [name] recent genetic test results were disclosed to her, as were recommendations warranted by these results. These results and recommendations are discussed in more detail below.  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 Large complex left paramidline pelvic mass likely rises from the left ovary. Together with left periaortic adenopathy and peritoneal carcinomatosis, findings are worrisome for metastatic ovarian cancer. 2. Small pelvic free fluid. 3.  Aortic atherosclerosis (ICD10-170.0).    01/21/2018 Pathology Results    Omentum, biopsy, dominant omental nodule within the right lower abdominal quadrant - POORLY DIFFERENTIATED CARCINOMA CONSISTENT WITH GYNECOLOGIC PRIMARY. - SEE MICROSCOPIC DESCRIPTION. Microscopic Comment The tumor is characterized by sheets of tumor cells with enlarged nuclei, many containing prominent nucleoli. There are patchy areas with eosinophilic cytoplasmic inclusions. Immunohistochemistry shows positivity with cytokeratin AE1/AE3, cytokeratin 7, estrogen receptor, progesterone receptor, PAX-8, WT-1, and placental alkaline phosphatase (PLAP). The tumor is negative with alpha fetoprotein, human chorionic gonadotropin, CD117, CD10, CD30, CD56, CDX-2, CEA, cytokeratin 20, S100, SOX-10, synaptophysin, chromogranin, D2-40, GATA-3, GCDFP, Glypican 3 and Inhibin. 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 The estrogen receptor is 100% with strong staining and the progesterone  receptor is 90% with strong staining.    01/21/2018 Procedure    Technically successful CT guided core needle biopsy of dominant omental nodule within the right lower abdominal quadrant, adjacent to the cecum.    01/24/2018 Cancer Staging    Staging form: Ovary, Fallopian Tube, and Primary Peritoneal Carcinoma, AJCC 8th Edition - Clinical: Stage IIIC (cT3c, cN1b, cM0) - Signed by [name], MD on 01/24/2018    01/25/2018 Tumor Marker    Patient's tumor was tested for the following markers: AFP Results of the tumor marker test revealed 4.7    01/31/2018 Surgery    Pre-operative Diagnosis: Ovarian cancer NOS  Post-operative Diagnosis: At least Stage 3 ovarian cancer   Operation: Laparoscopic omental biopsy  Surgeon: Mart Piggs, MD  Specimens: Omentum  Operative Findings: Omental nodule >2cm. Large left pelvic mass 12-15cm with sigmoid draped medially and potentially adherent to colon. The mass appeared to have a 3cm cystic area that was ruptured preoperatively. Uterus appears normal with small anterior fibroid ~1cm. Right adnexa nonenlarged. Small bowel without evidence of disease. No diaphragmatic disease.     01/31/2018 Pathology Results    Omentum, resection for tumor - HIGH GRADE CARCINOMA. - SEE MICROSCOPIC DESCRIPTION. Microscopic Comment This case is sent to Dr. Annitta Jersey for consultation. The case was discussed with Dr. Gerarda Fraction on 02/01/18. (JDP:kh 02/01/18) The omentum shows nodules of high grade carcinoma characterized by diffuse sheets of malignant cells with enlarged nuclei with prominent nucleoli. There is moderate to abundant eosinophilic cytoplasm and frequent mitotic figures. 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 The entire specimen is  examined histologically and there are nodules of tumor throughout the specimen with identical morphology. Additional immunohistochemistry for hepatoid markers will be performed and reported as an addendum    02/11/2018 Procedure    Status post right IJ port catheter placement. Catheter ready for use.    02/14/2018 Tumor Marker    Patient's tumor was tested for the following markers: CA-125 Results of the tumor marker test revealed 1523    02/15/2018 -  Chemotherapy    The patient had carboplatin and taxol. Taxol dose reduced from cycle 2 onwards    02/23/2018 Imaging    Interval development of abdominal ascites.  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  No significant change in multilobulated complex left pelvic mass, which compresses the pelvic structures and displaces them to the right.  Interval development of mild right hydronephrosis and hydroureter, possibly postobstructive.    03/08/2018 Tumor Marker    Patient's tumor was tested for the following markers: CA-125 Results of the tumor marker test revealed 1844    03/29/2018 Tumor Marker    Patient's tumor was tested for the following markers: CA-125 Results of the tumor marker test revealed 1141    04/10/2018 Imaging    1. New omental nodule. Otherwise, left ovarian mass and right paracolic gutter nodule have decreased in size in the interval. 2. Small ascites, increased. 3. Endometrial cavity may be dilated and contains fluid. Please correlate clinically. 4. Mild prominence of the ureters bilaterally, possibly due to large left adnexal mass. 5. Aortic atherosclerosis (ICD10-170.0). Coronary artery calcification.    04/25/2018 Surgery    Procedure(s) Performed:   1. Exploratory laparotomy  2. TAH/Bilateral salpingo-oophorectomy with radical tumor debulking for ovarian cancer . 3. Omentectomy  Surgeon: Bernita Raisin, MD Specimens: Bilateral tubes  and ovaries, omentum, sigmoid nodule, cecal nodule   Operative Findings: Large left sided ovarian tumor wedged retroperitoneal and inferiorly into pelvis with some adherence to sigmoid mesentary. Some tumor nodules on surface of sigmoid epiploica that was removed. ~1-2 cm tumor implant lateral to cecum also excised. No obvious omental disease. This represented an optimal cytoreduction (R0) with no gross visible disease remaining. No carcinomatosis. Diaphragms and small bowel /mesentary all free of disease.     04/25/2018 Pathology Results    1. 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 - PRESUMED OVARIAN SURFACE INVOLVEMENT. - DEFINITIVE FALLOPIAN TUBE NOT IDENTIFIED. - SEE COMMENT AND ONCOLOGY TABLE. 2. Uterus and cervix, right tube and ovary - UTERUS: -ENDOMETRIUM: INACTIVE ENDOMETRIUM. NO HYPERPLASIA OR MALIGNANCY. -MYOMETRIUM: LEIOMYOMA. NO MALIGNANCY. -SEROSA: UNREMARKABLE. NO MALIGNANCY. - CERVIX: BENIGN SQUAMOUS AND ENDOCERVICAL MUCOSA. NO DYSPLASIA OR MALIGNANCY. - RIGHT OVARY: UNREMARKABLE. NO MALIGNANCY. - RIGHT FALLOPIAN TUBES: METASTATIC CARCINOMA. 3. Soft tissue, biopsy, sigmoid implant - METASTATIC CARCINOMA. 4. Omentum, resection for tumor - METASTATIC CARCINOMA. 5. Soft tissue, biopsy, pelvic cecal implant - METASTATIC CARCINOMA. Microscopic Comment 1. OVARY or FALLOPIAN TUBE or PRIMARY PERITONEUM: Procedure: Left salpingo-oophorectomy. Hysterectomy and right salpingo-oophorectomy, omentectomy and implant biopsies. Specimen Integrity: Disrupted. Tumor Site: Left ovary. Ovarian Surface Involvement (required only if applicable): Presumed surface involvement, see comment. Fallopian Tube Surface Involvement (required only if applicable): Can not be determined, see comment. Tumor Size: 13.3 cm. Histologic Type: See comment. Histologic Grade: High grade. Implants (required for advanced stage serous/seromucinous borderline  tumors only): Present. Other Tissue/ Organ Involvement: Right fallopian tube, sigmoid implant, omental implant, cecal implant. Largest Extrapelvic Peritoneal Focus (required only if applicable): 1.6 cm. Peritoneal/Ascitic Fluid: N/A.  Treatment Effect (required only for high-grade serous carcinomas): Definitive effect not seen. Regional Lymph Nodes: No lymph nodes submitted or found. Pathologic Stage Classification (pTNM, AJCC 8th Edition): ypT3b, ypNX Representative Tumor Block: 1A-F Comment(s): Residual ovarian parenchyma is not identified and the entire mass consists of tumor, thus, surface involvement is presumably present. The left fallopian tube is not grossly or microscopically identified and thus involvement can not be assessed. A prior omental biopsy was sent to Dr. Annitta Jersey for consultation and was diagnosed as high grade carcinoma with hepatoid features. The current tumor is compared to the biopsy and looks morphologically identical.    05/06/2018 Tumor Marker    Patient's tumor was tested for the following markers: CA-125 Results of the tumor marker test revealed 190    06/10/2018 Tumor Marker    Patient's tumor was tested for the following markers: CA-125 Results of the tumor marker test revealed 16.4    07/05/2018 Tumor Marker    Patient's tumor was tested for the following markers: CA-125 Results of the tumor marker test revealed 13.4    08/01/2018 Imaging    1. No evidence of metastatic disease in the chest, abdomen or pelvis. 2. Interval resection of the large left adnexal mass. Haziness of the lower mesenteric and distal pericolonic fat, favor postsurgical change. This study will serve as a new baseline for future surveillance. 3. Chronic findings include: Aortic Atherosclerosis (ICD10-I70.0). Small hiatal hernia. Moderate sigmoid diverticulosis.    08/01/2018 Tumor Marker    Patient's tumor was tested for the following markers: CA-125 Results of the tumor marker test  revealed 12.1     FAMILY HISTORY:  We obtained a detailed, 4-generation family history.  Significant diagnoses are listed below: Family History  Problem Relation Age of Onset  . Diabetes Mother   . Stroke Mother   . Breast cancer Sister 36       breast ca  . Diabetes Sister   . Lung cancer Brother   . Stroke Brother   . Asthma Maternal Grandmother   . Melanoma Grandson 20  . Prostate cancer Nephew   . Cancer Cousin        mat first cousin, unknown cancer    The patient has four children who are cancer free.  She has one grandson who had melanoma at age 21.  She has eight siblings. One sister had breast cancer at 53.  This sister had three daughters, one who had genetic testing that was negative.  She had a brother who died of lung cancer.  He had a son with prostate cancer.  Both parents are deceased.  The patient's mother was one of 10 siblings.  One aunt is living at 26, and several cousins had cancer, but she is unsure of the type.  There is no other reported family history of cancer.  The patient's father is deceased.  He was one of 6 full siblings, and four paternal half siblings.  None reportedly had cancer.  The paternal grandparents are deceased.  [name] is aware of previous family history of genetic testing for hereditary cancer risks in her niece. Patient's maternal ancestors are of Caucasian descent, and paternal ancestors are of Caucasian descent. There is no reported Ashkenazi Jewish ancestry. There is no known consanguinity.   GENETIC TEST RESULTS: Genetic testing reported out on August 06, 2018 through the [facility] with CancerNext cancer panel found no deleterious mutations.  The CancerNext gene panel offered by [facility] includes sequencing and rearrangement analysis for  the following 34 genes:   APC, ATM, BARD1, BMPR1A, BRCA1, BRCA2, BRIP1, CDH1, CDK4, CDKN2A, CHEK2, DICER1, HOXB13, EPCAM, GREM1, MLH1, MRE11A, MSH2, MSH6, MUTYH, NBN, NF1, PALB2,  PMS2, POLD1, POLE, PTEN, RAD50, RAD51C, RAD51D, SMAD4, SMARCA4, STK11, and TP53.   The test report has been scanned into EPIC and is located under the Molecular Pathology section of the Results Review tab.    Genetic testing on the HRD testing for PARP inhibitors was negative.  No mutations were identified in any of the high risk genes.  We discussed with [name] that since the current genetic testing is not perfect, it is possible there may be a gene mutation in one of these genes that current testing cannot detect, but that chance is small.  We also discussed, that it is possible that another gene that has not yet been discovered, or that we have not yet tested, is responsible for the cancer diagnoses in the family, and it is, therefore, important to remain in touch with cancer genetics in the future so that we can continue to offer [name] the most up to date genetic testing.       CANCER SCREENING RECOMMENDATIONS: This result is reassuring and indicates that [name] likely does not have an increased risk for a future cancer due to a mutation in one of these genes. This normal test also suggests that [name]'s cancer was most likely not due to an inherited predisposition associated with one of these genes.  Most cancers happen by chance and this negative test suggests that her cancer falls into this category.  We, therefore, recommended she continue to follow the cancer management and screening guidelines provided by her oncology and primary healthcare provider.   An individual's cancer risk and medical management are not determined by genetic test results alone. Overall cancer risk assessment incorporates additional factors, including personal medical history, family history, and any available genetic information that may result in a personalized plan for cancer prevention and surveillance.  RECOMMENDATIONS FOR FAMILY MEMBERS:  Women in this family might be at some increased risk of  developing cancer, over the general population risk, simply due to the family history of cancer.  We recommended women in this family have a yearly mammogram beginning at age 3, or 63 years younger than the earliest onset of cancer, an annual clinical breast exam, and perform monthly breast self-exams. Women in this family should also have a gynecological exam as recommended by their primary provider. All family members should have a colonoscopy by age 74.  FOLLOW-UP: Lastly, we discussed with [name] that cancer genetics is a rapidly advancing field and it is possible that new genetic tests will be appropriate for her and/or her family members in the future. We encouraged her to remain in contact with cancer genetics on an annual basis so we can update her personal and family histories and let her know of advances in cancer genetics that may benefit this family.   Our contact number was provided. [name]'s questions were answered to her satisfaction, and she knows she is welcome to call us at anytime with additional questions or concerns.   Roma Kayser, MS, [facility] Certified Genetic Counselor [email or web address]

## 2018-08-08 ENCOUNTER — Telehealth: Payer: Self-pay | Admitting: Oncology

## 2018-08-08 ENCOUNTER — Encounter: Payer: Self-pay | Admitting: Hematology and Oncology

## 2018-08-08 ENCOUNTER — Inpatient Hospital Stay (HOSPITAL_BASED_OUTPATIENT_CLINIC_OR_DEPARTMENT_OTHER): Payer: Medicare Other | Admitting: Hematology and Oncology

## 2018-08-08 VITALS — BP 133/73 | HR 51 | Temp 97.5°F | Resp 17 | Ht 66.0 in | Wt 163.4 lb

## 2018-08-08 DIAGNOSIS — I10 Essential (primary) hypertension: Secondary | ICD-10-CM

## 2018-08-08 DIAGNOSIS — K5909 Other constipation: Secondary | ICD-10-CM

## 2018-08-08 DIAGNOSIS — G620 Drug-induced polyneuropathy: Secondary | ICD-10-CM

## 2018-08-08 DIAGNOSIS — Z79899 Other long term (current) drug therapy: Secondary | ICD-10-CM | POA: Diagnosis not present

## 2018-08-08 DIAGNOSIS — C786 Secondary malignant neoplasm of retroperitoneum and peritoneum: Secondary | ICD-10-CM | POA: Diagnosis not present

## 2018-08-08 DIAGNOSIS — C569 Malignant neoplasm of unspecified ovary: Secondary | ICD-10-CM

## 2018-08-08 DIAGNOSIS — Z9221 Personal history of antineoplastic chemotherapy: Secondary | ICD-10-CM | POA: Diagnosis not present

## 2018-08-08 DIAGNOSIS — T451X5A Adverse effect of antineoplastic and immunosuppressive drugs, initial encounter: Secondary | ICD-10-CM | POA: Diagnosis not present

## 2018-08-08 NOTE — Assessment & Plan Note (Signed)
Her blood pressure is satisfactory I recommend return visit to primary care doctor for medication adjustment

## 2018-08-08 NOTE — Progress Notes (Signed)
[facility] OFFICE PROGRESS NOTE  Patient Care Team: Marton Redwood, [name]  ASSESSMENT & PLAN:  Ovarian cancer [facility]) CT scan and blood work showed no evidence of disease The patient would like the port to be removed She will need follow-up with GYN oncology clinic every 3 months along with blood work   HTN (hypertension) Her blood pressure is satisfactory I recommend return visit to primary care doctor for medication adjustment  Peripheral neuropathy due to chemotherapy [facility]) She has mild residual neuropathy from prior treatment but not symptomatic.  Observe only  Other constipation Constipation is resolving.  She will continue conservative management with stool softener as needed   Orders Placed This Encounter  Procedures  . IR REMOVAL TUN ACCESS W/ PORT W/O FL MOD SED    Standing Status:   Future    Standing Expiration Date:   10/07/2019    Order Specific Question:   Reason for exam:    Answer:   chemo is completed    Order Specific Question:   Preferred Imaging Location?    Answer:   [facility]    INTERVAL HISTORY: Please see below for problem oriented charting. 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 Large complex left paramidline pelvic mass likely rises from the left ovary. Together with left periaortic adenopathy and peritoneal carcinomatosis, findings are worrisome for metastatic ovarian cancer. 2. Small pelvic free fluid. 3.  Aortic atherosclerosis (ICD10-170.0).    01/21/2018 Pathology Results    Omentum, biopsy, dominant omental nodule within the right lower abdominal quadrant - POORLY DIFFERENTIATED  CARCINOMA CONSISTENT WITH GYNECOLOGIC PRIMARY. - SEE MICROSCOPIC DESCRIPTION. Microscopic Comment The tumor is characterized by sheets of tumor cells with enlarged nuclei, many containing prominent nucleoli. There are patchy areas with eosinophilic cytoplasmic inclusions. Immunohistochemistry shows positivity with cytokeratin AE1/AE3, cytokeratin 7, estrogen receptor, progesterone receptor, PAX-8, WT-1, and placental alkaline phosphatase (PLAP). The tumor is negative with alpha fetoprotein, human chorionic gonadotropin, CD117, CD10, CD30, CD56, CDX-2, CEA, cytokeratin 20, S100, SOX-10, synaptophysin, chromogranin, D2-40, GATA-3, GCDFP, Glypican 3 and Inhibin. 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 The estrogen receptor is 100% with strong staining and the progesterone receptor is 90% with strong staining.    01/21/2018 Procedure    Technically successful CT guided core needle biopsy of dominant omental nodule within the right lower abdominal quadrant, adjacent to the cecum.    01/24/2018 Cancer Staging    Staging form: Ovary, Fallopian Tube, and Primary Peritoneal Carcinoma, AJCC 8th Edition - Clinical: Stage IIIC (cT3c, cN1b, cM0) - Signed by [name], MD on 01/24/2018    01/25/2018 Tumor Marker    Patient's tumor was tested for the following markers: AFP Results of the tumor marker test revealed 4.7    01/31/2018 Surgery    Pre-operative Diagnosis: Ovarian cancer NOS  Post-operative Diagnosis: At least Stage 3 ovarian cancer   Operation: Laparoscopic omental biopsy  Surgeon: Mart Piggs, MD  Specimens: Omentum  Operative Findings: Omental nodule >2cm. Large left pelvic mass 12-15cm with sigmoid draped medially and potentially adherent to colon. The mass appeared to have a 3cm  cystic area that was ruptured preoperatively. Uterus appears normal with small anterior fibroid ~1cm. Right adnexa nonenlarged. Small bowel without  evidence of disease. No diaphragmatic disease.     01/31/2018 Pathology Results    Omentum, resection for tumor - HIGH GRADE CARCINOMA. - SEE MICROSCOPIC DESCRIPTION. Microscopic Comment This case is sent to Dr. Annitta Jersey for consultation. The case was discussed with Dr. Gerarda Fraction on 02/01/18. (JDP:kh 02/01/18) The omentum shows nodules of high grade carcinoma characterized by diffuse sheets of malignant cells with enlarged nuclei with prominent nucleoli. There is moderate to abundant eosinophilic cytoplasm and frequent mitotic figures. 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 The entire specimen is examined histologically and there are nodules of tumor throughout the specimen with identical morphology. Additional immunohistochemistry for hepatoid markers will be performed and reported as an addendum    02/11/2018 Procedure    Status post right IJ port catheter placement. Catheter ready for use.    02/14/2018 Tumor Marker    Patient's tumor was tested for the following markers: CA-125 Results of the tumor marker test revealed 1523    02/15/2018 -  Chemotherapy    The patient had carboplatin and taxol. Taxol dose reduced from cycle 2 onwards    02/15/2018 - 07/05/2018 Chemotherapy    The patient had carboplatin and taxol x 6    02/23/2018 Imaging    Interval development of abdominal ascites.  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  No significant change in multilobulated complex left pelvic mass, which compresses the pelvic structures and displaces them to the right.  Interval development of mild right hydronephrosis and hydroureter, possibly postobstructive.    03/08/2018 Tumor Marker     Patient's tumor was tested for the following markers: CA-125 Results of the tumor marker test revealed 1844    03/29/2018 Tumor Marker    Patient's tumor was tested for the following markers: CA-125 Results of the tumor marker test revealed 1141    04/10/2018 Imaging    1. New omental nodule. Otherwise, left ovarian mass and right paracolic gutter nodule have decreased in size in the interval. 2. Small ascites, increased. 3. Endometrial cavity may be dilated and contains fluid. Please correlate clinically. 4. Mild prominence of the ureters bilaterally, possibly due to large left adnexal mass. 5. Aortic atherosclerosis (ICD10-170.0). Coronary artery calcification.    04/25/2018 Surgery    Procedure(s) Performed:   1. Exploratory laparotomy  2. TAH/Bilateral salpingo-oophorectomy with radical tumor debulking for ovarian cancer . 3. Omentectomy  Surgeon: Bernita Raisin, MD Specimens: Bilateral tubes and ovaries, omentum, sigmoid nodule, cecal nodule   Operative Findings: Large left sided ovarian tumor wedged retroperitoneal and inferiorly into pelvis with some adherence to sigmoid mesentary. Some tumor nodules on surface of sigmoid epiploica that was removed. ~1-2 cm tumor implant lateral to cecum also excised. No obvious omental disease. This represented an optimal cytoreduction (R0) with no gross visible disease remaining. No carcinomatosis. Diaphragms and small bowel /mesentary all free of disease.     04/25/2018 Pathology Results    1. 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 - PRESUMED OVARIAN SURFACE INVOLVEMENT. - DEFINITIVE FALLOPIAN TUBE NOT IDENTIFIED. - SEE COMMENT AND ONCOLOGY TABLE. 2. Uterus and  cervix, right tube and ovary - UTERUS: -ENDOMETRIUM: INACTIVE ENDOMETRIUM. NO HYPERPLASIA OR MALIGNANCY. -MYOMETRIUM: LEIOMYOMA. NO MALIGNANCY. -SEROSA: UNREMARKABLE. NO MALIGNANCY. - CERVIX: BENIGN SQUAMOUS AND ENDOCERVICAL  MUCOSA. NO DYSPLASIA OR MALIGNANCY. - RIGHT OVARY: UNREMARKABLE. NO MALIGNANCY. - RIGHT FALLOPIAN TUBES: METASTATIC CARCINOMA. 3. Soft tissue, biopsy, sigmoid implant - METASTATIC CARCINOMA. 4. Omentum, resection for tumor - METASTATIC CARCINOMA. 5. Soft tissue, biopsy, pelvic cecal implant - METASTATIC CARCINOMA. Microscopic Comment 1. OVARY or FALLOPIAN TUBE or PRIMARY PERITONEUM: Procedure: Left salpingo-oophorectomy. Hysterectomy and right salpingo-oophorectomy, omentectomy and implant biopsies. Specimen Integrity: Disrupted. Tumor Site: Left ovary. Ovarian Surface Involvement (required only if applicable): Presumed surface involvement, see comment. Fallopian Tube Surface Involvement (required only if applicable): Can not be determined, see comment. Tumor Size: 13.3 cm. Histologic Type: See comment. Histologic Grade: High grade. Implants (required for advanced stage serous/seromucinous borderline tumors only): Present. Other Tissue/ Organ Involvement: Right fallopian tube, sigmoid implant, omental implant, cecal implant. Largest Extrapelvic Peritoneal Focus (required only if applicable): 1.6 cm. Peritoneal/Ascitic Fluid: N/A. Treatment Effect (required only for high-grade serous carcinomas): Definitive effect not seen. Regional Lymph Nodes: No lymph nodes submitted or found. Pathologic Stage Classification (pTNM, AJCC 8th Edition): ypT3b, ypNX Representative Tumor Block: 1A-F Comment(s): Residual ovarian parenchyma is not identified and the entire mass consists of tumor, thus, surface involvement is presumably present. The left fallopian tube is not grossly or microscopically identified and thus involvement can not be assessed. A prior omental biopsy was sent to Dr. Annitta Jersey for consultation and was diagnosed as high grade carcinoma with hepatoid features. The current tumor is compared to the biopsy and looks morphologically identical.    05/06/2018 Tumor Marker    Patient's  tumor was tested for the following markers: CA-125 Results of the tumor marker test revealed 190    06/10/2018 Tumor Marker    Patient's tumor was tested for the following markers: CA-125 Results of the tumor marker test revealed 16.4    07/05/2018 Tumor Marker    Patient's tumor was tested for the following markers: CA-125 Results of the tumor marker test revealed 13.4    08/01/2018 Imaging    1. No evidence of metastatic disease in the chest, abdomen or pelvis. 2. Interval resection of the large left adnexal mass. Haziness of the lower mesenteric and distal pericolonic fat, favor postsurgical change. This study will serve as a new baseline for future surveillance. 3. Chronic findings include: Aortic Atherosclerosis (ICD10-I70.0). Small hiatal hernia. Moderate sigmoid diverticulosis.    08/01/2018 Tumor Marker    Patient's tumor was tested for the following markers: CA-125 Results of the tumor marker test revealed 12.1     REVIEW OF SYSTEMS:   Constitutional: Denies fevers, chills or abnormal weight loss Eyes: Denies blurriness of vision Ears, nose, mouth, throat, and face: Denies mucositis or sore throat Respiratory: Denies cough, dyspnea or wheezes Cardiovascular: Denies palpitation, chest discomfort or lower extremity swelling Gastrointestinal:  Denies nausea, heartburn or change in bowel habits Skin: Denies abnormal skin rashes Lymphatics: Denies new lymphadenopathy or easy bruising Neurological:Denies numbness, tingling or new weaknesses Behavioral/Psych: Mood is stable, no new changes  All other systems were reviewed with the patient and are negative.  I have reviewed the past medical history, past surgical history, social history and family history with the patient and they are unchanged from previous note.  ALLERGIES:  has No Known Allergies.  MEDICATIONS:  Current Outpatient Medications  Medication Sig Dispense Refill  . acetaminophen (TYLENOL) 500 MG tablet Take 500  mg  by mouth every 8 (eight) hours as needed for mild pain or headache.     Marland Kitchen amLODipine (NORVASC) 5 MG tablet Take 5 mg by mouth at bedtime.     . fluticasone (FLONASE) 50 MCG/ACT nasal spray Place 2 sprays into both nostrils 2 (two) times daily. (Patient taking differently: Place 2 sprays into both nostrils as needed. ) 16 g 2  . levothyroxine (SYNTHROID, LEVOTHROID) 88 MCG tablet Take 88 mcg by mouth daily before breakfast.    . loratadine (CLARITIN) 10 MG tablet Take 10 mg by mouth daily as needed for allergies.     . metoprolol succinate (TOPROL-XL) 50 MG 24 hr tablet Take 50 mg by mouth every morning.     . Multiple Vitamins-Minerals (MULTIVITAMINS THER. W/MINERALS) TABS Take 1 tablet by mouth daily.      . naphazoline-pheniramine (NAPHCON-A) 0.025-0.3 % ophthalmic solution Place 1 drop into both eyes daily as needed for eye irritation.    Marland Kitchen olmesartan (BENICAR) 40 MG tablet Take 40 mg by mouth at bedtime.      No current facility-administered medications for this visit.     PHYSICAL EXAMINATION: ECOG PERFORMANCE STATUS: 1 - Symptomatic but completely ambulatory  Vitals:   08/08/18 0924  BP: 133/73  Pulse: (!) 51  Resp: 17  Temp: (!) 97.5 F (36.4 C)  SpO2: 98%   Filed Weights   08/08/18 0924  Weight: 163 lb 6.4 oz (74.1 kg)    GENERAL:alert, no distress and comfortable NEURO: alert & oriented x 3 with fluent speech, no focal motor/sensory deficits  LABORATORY DATA:  I have reviewed the data as listed    Component Value Date/Time   NA 140 08/01/2018 0935   K 3.8 08/01/2018 0935   CL 107 08/01/2018 0935   CO2 27 08/01/2018 0935   GLUCOSE 108 (H) 08/01/2018 0935   BUN 11 08/01/2018 0935   CREATININE 0.69 08/01/2018 0935   CREATININE 0.72 07/15/2018 1010   CALCIUM 9.7 08/01/2018 0935   PROT 7.4 08/01/2018 0935   ALBUMIN 3.8 08/01/2018 0935   AST 26 08/01/2018 0935   AST 21 07/15/2018 1010   ALT 28 08/01/2018 0935   ALT 22 07/15/2018 1010   ALKPHOS 94 08/01/2018  0935   BILITOT 0.5 08/01/2018 0935   BILITOT 0.3 07/15/2018 1010   GFRNONAA >60 08/01/2018 0935   GFRNONAA >60 07/15/2018 1010   GFRAA >60 08/01/2018 0935   GFRAA >60 07/15/2018 1010    No results found for: SPEP, UPEP  Lab Results  Component Value Date   WBC 7.7 08/01/2018   NEUTROABS 5.2 08/01/2018   HGB 11.9 (L) 08/01/2018   HCT 37.2 08/01/2018   MCV 89.2 08/01/2018   PLT 241 08/01/2018      Chemistry      Component Value Date/Time   NA 140 08/01/2018 0935   K 3.8 08/01/2018 0935   CL 107 08/01/2018 0935   CO2 27 08/01/2018 0935   BUN 11 08/01/2018 0935   CREATININE 0.69 08/01/2018 0935   CREATININE 0.72 07/15/2018 1010      Component Value Date/Time   CALCIUM 9.7 08/01/2018 0935   ALKPHOS 94 08/01/2018 0935   AST 26 08/01/2018 0935   AST 21 07/15/2018 1010   ALT 28 08/01/2018 0935   ALT 22 07/15/2018 1010   BILITOT 0.5 08/01/2018 0935   BILITOT 0.3 07/15/2018 1010       RADIOGRAPHIC STUDIES: I have reviewed multiple imaging studies with patient and family I have personally reviewed  the radiological images as listed and agreed with the findings in the report. Ct Chest W Contrast  Result Date: 08/01/2018 CLINICAL DATA:  Left ovarian high-grade carcinoma diagnosed August 2019 status post chemotherapy with TAHBSO, omentectomy and radical tumor debulking 04/25/2018. Completion of adjuvant chemotherapy. Restaging. EXAM: CT CHEST, ABDOMEN, AND PELVIS WITH CONTRAST TECHNIQUE: Multidetector CT imaging of the chest, abdomen and pelvis was performed following the standard protocol during bolus administration of intravenous contrast. CONTRAST:  158m OMNIPAQUE IOHEXOL 300 MG/ML  SOLN COMPARISON:  04/10/2018 CT chest, abdomen and pelvis. FINDINGS: CT CHEST FINDINGS Cardiovascular: Normal heart size. No significant pericardial effusion/thickening. Stable aortic valvular calcification and thickening. Right internal jugular Port-A-Cath terminates in the lower third of the SVC.  Atherosclerotic nonaneurysmal thoracic aorta. Normal caliber pulmonary arteries. No central pulmonary emboli. Mediastinum/Nodes: No discrete thyroid nodules. Unremarkable esophagus. No pathologically enlarged axillary, mediastinal or hilar lymph nodes. Lungs/Pleura: No pneumothorax. No pleural effusion. No acute consolidative airspace disease or lung masses. Basilar left lower lobe 3 mm solid pulmonary nodule (series 7/image 138) is stable since 02/19/2013 CT abdomen study and considered benign. No new significant pulmonary nodules. Musculoskeletal: No aggressive appearing focal osseous lesions. Moderate thoracic spondylosis. CT ABDOMEN PELVIS FINDINGS Hepatobiliary: Normal liver size. Vague hypodense 0.5 cm right liver lesion (series 2/image 63), too small to characterize, stable since 01/02/2018 CT, probably stable since 02/19/2013 CT, probably benign. No new liver lesions. Normal gallbladder with no radiopaque cholelithiasis. No biliary ductal dilatation. Pancreas: Normal, with no mass or duct dilation. Spleen: Normal size. No mass. Adrenals/Urinary Tract: Normal adrenals. Stable mild fullness of the right renal collecting system without overt right hydronephrosis. No left hydronephrosis. Scattered subcentimeter hypodense renal cortical lesions in both kidneys are too small to characterize and are unchanged, considered benign. No new renal lesions. Bladder and distal ureters are obscured by streak artifact from the right hip hardware. Bladder is nondistended and grossly normal. Stomach/Bowel: Small hiatal hernia. Otherwise normal nondistended stomach. Normal caliber small bowel with no small bowel wall thickening. Appendectomy. Oral contrast traverses to the transverse colon. Moderate sigmoid diverticulosis. No definite colonic wall thickening. Vascular/Lymphatic: Atherosclerotic nonaneurysmal abdominal aorta. Patent portal, splenic, hepatic and renal veins. No pathologically enlarged lymph nodes in the abdomen  or pelvis. Reproductive: Status posthysterectomy. Interval resection of large solid left adnexal mass. There is haziness of the lower mesenteric and pericolonic fat along the sigmoid colon without discrete peritoneal nodularity. No adnexal masses. Other: No pneumoperitoneum, ascites or focal fluid collection. Previously described left omental and right pericolic gutter nodules have been resected. No peritoneal implants are noted on today's scan. Midline laparotomy scar without superficial fluid collections or masses. Musculoskeletal: No aggressive appearing focal osseous lesions. Right total hip arthroplasty. Moderate lumbar spondylosis. IMPRESSION: 1. No evidence of metastatic disease in the chest, abdomen or pelvis. 2. Interval resection of the large left adnexal mass. Haziness of the lower mesenteric and distal pericolonic fat, favor postsurgical change. This study will serve as a new baseline for future surveillance. 3. Chronic findings include: Aortic Atherosclerosis (ICD10-I70.0). Small hiatal hernia. Moderate sigmoid diverticulosis. Electronically Signed   By: JIlona SorrelM.D.   On: 08/01/2018 13:37   Ct Abdomen Pelvis W Contrast  Result Date: 08/01/2018 CLINICAL DATA:  Left ovarian high-grade carcinoma diagnosed August 2019 status post chemotherapy with TAHBSO, omentectomy and radical tumor debulking 04/25/2018. Completion of adjuvant chemotherapy. Restaging. EXAM: CT CHEST, ABDOMEN, AND PELVIS WITH CONTRAST TECHNIQUE: Multidetector CT imaging of the chest, abdomen and pelvis was performed following the standard protocol  during bolus administration of intravenous contrast. CONTRAST:  159m OMNIPAQUE IOHEXOL 300 MG/ML  SOLN COMPARISON:  04/10/2018 CT chest, abdomen and pelvis. FINDINGS: CT CHEST FINDINGS Cardiovascular: Normal heart size. No significant pericardial effusion/thickening. Stable aortic valvular calcification and thickening. Right internal jugular Port-A-Cath terminates in the lower third of  the SVC. Atherosclerotic nonaneurysmal thoracic aorta. Normal caliber pulmonary arteries. No central pulmonary emboli. Mediastinum/Nodes: No discrete thyroid nodules. Unremarkable esophagus. No pathologically enlarged axillary, mediastinal or hilar lymph nodes. Lungs/Pleura: No pneumothorax. No pleural effusion. No acute consolidative airspace disease or lung masses. Basilar left lower lobe 3 mm solid pulmonary nodule (series 7/image 138) is stable since 02/19/2013 CT abdomen study and considered benign. No new significant pulmonary nodules. Musculoskeletal: No aggressive appearing focal osseous lesions. Moderate thoracic spondylosis. CT ABDOMEN PELVIS FINDINGS Hepatobiliary: Normal liver size. Vague hypodense 0.5 cm right liver lesion (series 2/image 63), too small to characterize, stable since 01/02/2018 CT, probably stable since 02/19/2013 CT, probably benign. No new liver lesions. Normal gallbladder with no radiopaque cholelithiasis. No biliary ductal dilatation. Pancreas: Normal, with no mass or duct dilation. Spleen: Normal size. No mass. Adrenals/Urinary Tract: Normal adrenals. Stable mild fullness of the right renal collecting system without overt right hydronephrosis. No left hydronephrosis. Scattered subcentimeter hypodense renal cortical lesions in both kidneys are too small to characterize and are unchanged, considered benign. No new renal lesions. Bladder and distal ureters are obscured by streak artifact from the right hip hardware. Bladder is nondistended and grossly normal. Stomach/Bowel: Small hiatal hernia. Otherwise normal nondistended stomach. Normal caliber small bowel with no small bowel wall thickening. Appendectomy. Oral contrast traverses to the transverse colon. Moderate sigmoid diverticulosis. No definite colonic wall thickening. Vascular/Lymphatic: Atherosclerotic nonaneurysmal abdominal aorta. Patent portal, splenic, hepatic and renal veins. No pathologically enlarged lymph nodes in the  abdomen or pelvis. Reproductive: Status posthysterectomy. Interval resection of large solid left adnexal mass. There is haziness of the lower mesenteric and pericolonic fat along the sigmoid colon without discrete peritoneal nodularity. No adnexal masses. Other: No pneumoperitoneum, ascites or focal fluid collection. Previously described left omental and right pericolic gutter nodules have been resected. No peritoneal implants are noted on today's scan. Midline laparotomy scar without superficial fluid collections or masses. Musculoskeletal: No aggressive appearing focal osseous lesions. Right total hip arthroplasty. Moderate lumbar spondylosis. IMPRESSION: 1. No evidence of metastatic disease in the chest, abdomen or pelvis. 2. Interval resection of the large left adnexal mass. Haziness of the lower mesenteric and distal pericolonic fat, favor postsurgical change. This study will serve as a new baseline for future surveillance. 3. Chronic findings include: Aortic Atherosclerosis (ICD10-I70.0). Small hiatal hernia. Moderate sigmoid diverticulosis. Electronically Signed   By: JIlona SorrelM.D.   On: 08/01/2018 13:37    All questions were answered. The patient knows to call the clinic with any problems, questions or concerns. No barriers to learning was detected.  I spent 15 minutes counseling the patient face to face. The total time spent in the appointment was 20 minutes and more than 50% was on counseling and review of test results  NHeath Lark MD 08/08/2018 10:38 AM

## 2018-08-08 NOTE — Telephone Encounter (Signed)
Left a message for patient's daughter, [name], with appointment for Dr. Fermin Schwab on 10/29/18 at 9:30 am.

## 2018-08-08 NOTE — Assessment & Plan Note (Signed)
CT scan and blood work showed no evidence of disease The patient would like the port to be removed She will need follow-up with GYN oncology clinic every 3 months along with blood work

## 2018-08-08 NOTE — Assessment & Plan Note (Signed)
Constipation is resolving.  She will continue conservative management with stool softener as needed

## 2018-08-08 NOTE — Assessment & Plan Note (Signed)
She has mild residual neuropathy from prior treatment but not symptomatic.  Observe only

## 2018-08-22 MED FILL — AMLODIPINE BESYLATE 5 MG TA: 5 | 30 days supply | Qty: 30 | Fill #0 | Status: TO

## 2018-08-23 ENCOUNTER — Other Ambulatory Visit: Payer: Self-pay | Admitting: Radiology

## 2018-08-26 ENCOUNTER — Ambulatory Visit (HOSPITAL_COMMUNITY)
Admission: RE | Admit: 2018-08-26 | Discharge: 2018-08-26 | Disposition: A | Discharge status: Home / Self Care | Payer: Medicare Other | Source: Ambulatory Visit | Attending: Internal Medicine | Admitting: Internal Medicine

## 2018-08-26 ENCOUNTER — Ambulatory Visit (HOSPITAL_COMMUNITY)
Admission: RE | Admit: 2018-08-26 | Discharge: 2018-08-26 | Disposition: A | Discharge status: Home / Self Care | Payer: Medicare Other | Source: Ambulatory Visit | Attending: Hematology and Oncology | Admitting: Hematology and Oncology

## 2018-08-26 ENCOUNTER — Encounter (HOSPITAL_COMMUNITY): Payer: Self-pay

## 2018-08-26 DIAGNOSIS — Z9221 Personal history of antineoplastic chemotherapy: Secondary | ICD-10-CM | POA: Diagnosis not present

## 2018-08-26 DIAGNOSIS — Z452 Encounter for adjustment and management of vascular access device: Secondary | ICD-10-CM | POA: Insufficient documentation

## 2018-08-26 DIAGNOSIS — C569 Malignant neoplasm of unspecified ovary: Secondary | ICD-10-CM | POA: Diagnosis not present

## 2018-08-26 HISTORY — PX: IR REMOVAL TUN ACCESS W/ PORT W/O FL MOD SED: IMG2290

## 2018-08-26 LAB — CBC
HCT: 39.6 % (ref 36.0–46.0)
Hemoglobin: 12.4 g/dL (ref 12.0–15.0)
MCH: 29 pg (ref 26.0–34.0)
MCHC: 31.3 g/dL (ref 30.0–36.0)
MCV: 92.7 fL (ref 80.0–100.0)
Platelets: 243 10*3/uL (ref 150–400)
RBC: 4.27 MIL/uL (ref 3.87–5.11)
RDW: 15.2 % (ref 11.5–15.5)
WBC: 6 10*3/uL (ref 4.0–10.5)
nRBC: 0 % (ref 0.0–0.2)

## 2018-08-26 LAB — BASIC METABOLIC PANEL
Anion gap: 9 (ref 5–15)
BUN: 17 mg/dL (ref 8–23)
CALCIUM: 9.4 mg/dL (ref 8.9–10.3)
CO2: 23 mmol/L (ref 22–32)
Chloride: 107 mmol/L (ref 98–111)
Creatinine, Ser: 0.64 mg/dL (ref 0.44–1.00)
GFR calc Af Amer: >60 mL/min (ref >60)
GFR calc non Af Amer: >60 mL/min (ref >60)
Glucose, Bld: 114 mg/dL — ABNORMAL HIGH (ref 70–99)
Potassium: 3.7 mmol/L (ref 3.5–5.1)
Sodium: 139 mmol/L (ref 135–145)

## 2018-08-26 LAB — PROTIME-INR
INR: 1.1 (ref 0.8–1.2)
Prothrombin Time: 13.7 seconds (ref 11.4–15.2)

## 2018-08-26 MED ORDER — CEFAZOLIN SODIUM-DEXTROSE 2-4 GM/100ML-% IV SOLN
2.0000 g | INTRAVENOUS | Status: AC
  Administered 2018-08-26: 2 g via INTRAVENOUS

## 2018-08-26 MED ORDER — SODIUM CHLORIDE 0.9 % IV SOLN
INTRAVENOUS | Status: DC
  Administered 2018-08-26: 11:00:00 via INTRAVENOUS

## 2018-08-26 MED ORDER — CEFAZOLIN SODIUM-DEXTROSE 2-4 GM/100ML-% IV SOLN
INTRAVENOUS | Status: AC
  Administered 2018-08-26: 2 g via INTRAVENOUS
  Filled 2018-08-26: qty 100

## 2018-08-26 MED ORDER — LIDOCAINE-EPINEPHRINE (PF) 2 %-1:200000 IJ SOLN
INTRAMUSCULAR | Status: AC
  Filled 2018-08-26: qty 20

## 2018-08-26 NOTE — Discharge Instructions (Signed)
Implanted Port Removal, Care After  This sheet gives you information about how to care for yourself after your procedure. Your health care provider may also give you more specific instructions. If you have problems or questions, contact your health care provider.  What can I expect after the procedure?  After the procedure, it is common to have:   Soreness or pain near your incision.   Some swelling or bruising near your incision.  Follow these instructions at home:  Medicines   Take over-the-counter and prescription medicines only as told by your health care provider.   If you were prescribed an antibiotic medicine, take it as told by your health care provider. Do not stop taking the antibiotic even if you start to feel better.  Bathing   Do not take baths, swim, or use a hot tub until your health care provider approves. Ask your health care provider if you can take showers. You may only be allowed to take sponge baths.  Incision care     Follow instructions from your health care provider about how to take care of your incision. Make sure you:  ? Wash your hands with soap and water before you change your bandage (dressing). If soap and water are not available, use hand sanitizer.  ? Change your dressing as told by your health care provider.  ? Keep your dressing dry.  ? Leave stitches (sutures), skin glue, or adhesive strips in place. These skin closures may need to stay in place for 2 weeks or longer. If adhesive strip edges start to loosen and curl up, you may trim the loose edges. Do not remove adhesive strips completely unless your health care provider tells you to do that.   Check your incision area every day for signs of infection. Check for:  ? More redness, swelling, or pain.  ? More fluid or blood.  ? Warmth.  ? Pus or a bad smell.  Driving     Do not drive for 24 hours if you were given a medicine to help you relax (sedative) during your procedure.   If you did not receive a sedative, ask  your health care provider when it is safe to drive.  Activity   Return to your normal activities as told by your health care provider. Ask your health care provider what activities are safe for you.   Do not lift anything that is heavier than 10 lb (4.5 kg), or the limit that you are told, until your health care provider says that it is safe.   Do not do activities that involve lifting your arms over your head.  General instructions   Do not use any products that contain nicotine or tobacco, such as cigarettes and e-cigarettes. These can delay healing. If you need help quitting, ask your health care provider.   Keep all follow-up visits as told by your health care provider. This is important.  Contact a health care provider if:   You have more redness, swelling, or pain around your incision.   You have more fluid or blood coming from your incision.   Your incision feels warm to the touch.   You have pus or a bad smell coming from your incision.   You have pain that is not relieved by your pain medicine.  Get help right away if you have:   A fever or chills.   Chest pain.   Difficulty breathing.  Summary   After the procedure, it is common to   have pain, soreness, swelling, or bruising near your incision.   If you were prescribed an antibiotic medicine, take it as told by your health care provider. Do not stop taking the antibiotic even if you start to feel better.   Do not drive for 24 hours if you were given a sedative during your procedure.   Return to your normal activities as told by your health care provider. Ask your health care provider what activities are safe for you.  This information is not intended to replace advice given to you by your health care provider. Make sure you discuss any questions you have with your health care provider.  Document Released: 05/17/2015 Document Revised: 07/19/2017 Document Reviewed: 07/19/2017  Elsevier Interactive Patient Education  2019 [employer]

## 2018-08-26 NOTE — Procedures (Signed)
Interventional Radiology Procedure Note  Procedure: Removal or right chest PowerPort  Complications: None  Estimated Blood Loss: None  Recommendations: - DC home  Signed,  Criselda Peaches, MD

## 2018-08-26 NOTE — Progress Notes (Signed)
Patient ID: Shannon Ellis, female   DOB: [birth year], 85 y.o.   MRN: [id number] Patient presents to radiology department today for Port-A-Cath removal.  She has a history of ovarian carcinoma and is status post surgery as well as chemotherapy.  Follow-up imaging reveals no evidence of recurrent disease. Risks and benefits of image guided port-a-catheter placement was discussed with the patient including, but not limited to bleeding, infection, pneumothorax, or fibrin sheath development and need for additional procedures.  All of the patient's questions were answered, patient is agreeable to proceed. Consent signed and in chart.  Patient does not wish to receive IV conscious sedation for procedure.

## 2018-09-17 MED FILL — AMLODIPINE BESYLATE 5 MG TA: 5 | 30 days supply | Qty: 30 | Fill #1 | Status: TO

## 2018-09-27 ENCOUNTER — Ambulatory Visit: Payer: Medicare Other | Admitting: Obstetrics

## 2018-10-03 ENCOUNTER — Encounter: Payer: Self-pay | Admitting: Hematology and Oncology

## 2018-10-16 ENCOUNTER — Telehealth: Payer: Self-pay | Admitting: Gynecologic Oncology

## 2018-10-16 NOTE — Telephone Encounter (Signed)
Called patient to see how she is doing. She has an upcoming appt on May 12 and advised her that this would need to be rescheduled or she could see another provider if she was having symptoms. States she is doing well. No symptoms voiced. Advised she will be contacted to reschedule her appointment at a later date.

## 2018-10-29 ENCOUNTER — Ambulatory Visit: Payer: Medicare Other | Admitting: Gynecology

## 2018-11-27 ENCOUNTER — Telehealth: Payer: Self-pay | Admitting: *Deleted

## 2018-11-27 NOTE — Telephone Encounter (Signed)
Called and left the patient a message to call the office back. Need to schedule a follow up appt for July

## 2018-11-27 NOTE — Telephone Encounter (Signed)
Patient called back and scheduled for a follow up with Dr. Fermin Schwab

## 2018-12-18 ENCOUNTER — Telehealth: Payer: Self-pay | Admitting: *Deleted

## 2018-12-18 NOTE — Telephone Encounter (Signed)
Called and changed her appt to a phone visit

## 2018-12-19 DIAGNOSIS — M810 Age-related osteoporosis without current pathological fracture: Secondary | ICD-10-CM | POA: Diagnosis not present

## 2018-12-19 DIAGNOSIS — E7849 Other hyperlipidemia: Secondary | ICD-10-CM | POA: Diagnosis not present

## 2018-12-19 DIAGNOSIS — E038 Other specified hypothyroidism: Secondary | ICD-10-CM | POA: Diagnosis not present

## 2018-12-19 DIAGNOSIS — I10 Essential (primary) hypertension: Secondary | ICD-10-CM | POA: Diagnosis not present

## 2018-12-24 DIAGNOSIS — R82998 Other abnormal findings in urine: Secondary | ICD-10-CM | POA: Diagnosis not present

## 2018-12-24 DIAGNOSIS — I10 Essential (primary) hypertension: Secondary | ICD-10-CM | POA: Diagnosis not present

## 2018-12-25 ENCOUNTER — Encounter: Payer: Self-pay | Admitting: Gynecology

## 2018-12-25 ENCOUNTER — Inpatient Hospital Stay: Payer: Medicare Other | Attending: Gynecology | Admitting: Gynecology

## 2018-12-25 DIAGNOSIS — C569 Malignant neoplasm of unspecified ovary: Secondary | ICD-10-CM | POA: Diagnosis not present

## 2018-12-25 DIAGNOSIS — Z90710 Acquired absence of both cervix and uterus: Secondary | ICD-10-CM | POA: Diagnosis not present

## 2018-12-25 DIAGNOSIS — Z90722 Acquired absence of ovaries, bilateral: Secondary | ICD-10-CM

## 2018-12-25 NOTE — Progress Notes (Signed)
I connected with  Shannon Ellis on 24/58/09 by a phone application and verified that I am speaking with the correct person using two identifiers.   I discussed the limitations of evaluation and management by telemedicine. The patient expressed understanding and agreed to proceed. Virtual Visit via Telephone Note  I connected with Shannon Ellis on 12/25/18 at 10:30 AM EDT by telephone and verified that I am speaking with the correct person using two identifiers.  Location: Patient: home Provider: Gaspar Cola   I discussed the limitations, risks, security and privacy concerns of performing an evaluation and management service by telephone and the availability of in person appointments. I also discussed with the patient that there may be a patient responsible charge related to this service. The patient expressed understanding and agreed to proceed.  Assessment and Plan: Advanced ovarian cancer, currently in remission and asymptomatic.  We will obtain a CA125 this week and schedule a return visit for 3 months.    Interval History: Since her last visit, she has done well.  Her functional status is good and she is walking with a friend several days a week.  She denies any GI, GU or pelvic symptoms.  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  She has no concerns or symptoms of recurrent disease.  History of Present Illness: The patient initally presented with advanced disease.  Biopsy was consistent with ovarian cancer ans she was treated with neoadjuvant chemo.  After 3 cycles she was having a great response.  On 04/25/18 underwent Interval Debulking withTAH/BSO with radical tumor debulking for ovarian cancer/omentectomy  Findings as follows: Large left sided ovarian tumor wedged retroperitoneal and inferiorly into pelvis with some adherence to sigmoid mesentary. Some tumor nodules on surface of sigmoid epiploica that was removed. ~1-2 cm tumor implant lateral to cecum also excised. No  obvious omental disease. This represented an optimal cytoreduction (R0) with no gross visible disease remaining. No carcinomatosis. Diaphragms and small bowel /mesentary all free of disease. Pathology   - HIGH GRADE Ovarian CARCINOMA,     She completed 6 cycles of carbo taxol on July 05, 2018.  In followup she had a normal CA 125 (12.1 u/ml) and a negative CT scan.  She began follow up.  She developed mild peripherial neuropathy.       Shannon Ellis 85 y.o. female  No chief complaint on file.   Review of Systems:10 point review of systems is negative except as noted in interval history.   Vitals: There were no vitals taken for this visit.  Physical Exam:  Not done, phone visit  No Known Allergies  Past Medical History:  Diagnosis Date  . Allergy   . Anemia   . Blood transfusion without reported diagnosis   . Cancer ([place])   . Complication of anesthesia    waking up-does not take much medication  . Family history of adverse reaction to anesthesia    problems waking up as does not need much medication  . Family history of breast cancer   . Family history of melanoma   . Family history of prostate cancer   . Hypertension   . Hypothyroidism   . Osteoporosis 10/07   Dexa scan  . PONV (postoperative nausea and vomiting)     Past Surgical History:  Procedure Laterality Date  . APPENDECTOMY  1950   not ruptured  . DEBULKING N/A 04/25/2018   Procedure: DEBULKING;  Surgeon: [name], MD;  Location: [facility];  Service: Gynecology;  Laterality: N/A;  . HYSTERECTOMY ABDOMINAL WITH SALPINGO-OOPHORECTOMY Bilateral 04/25/2018   Procedure: HYSTERECTOMY ABDOMINAL WITH BILATERAL SALPINGO-OOPHORECTOMY;  Surgeon: [name], MD;  Location: [facility];  Service: Gynecology;  Laterality: Bilateral;  . IR IMAGING GUIDED PORT INSERTION  02/11/2018  . IR REMOVAL TUN ACCESS W/ PORT W/O FL MOD SED  08/26/2018  . LAPAROSCOPY N/A 01/31/2018   Procedure: LAPAROSCOPY DIAGNOSTIC WITH  BIOPSY;  Surgeon: [name], MD;  Location: [facility];  Service: Gynecology;  Laterality: N/A;  . OMENTECTOMY N/A 04/25/2018   Procedure: OMENTECTOMY;  Surgeon: [name], MD;  Location: [facility];  Service: Gynecology;  Laterality: N/A;  . ovarian biopsy    . THYROIDECTOMY    . TONSILLECTOMY    . TOTAL HIP ARTHROPLASTY Right     Current Outpatient Medications  Medication Sig Dispense Refill  . acetaminophen (TYLENOL) 500 MG tablet Take 500 mg by mouth every 8 (eight) hours as needed for mild pain or headache.     Marland Kitchen amLODipine (NORVASC) 5 MG tablet Take 5 mg by mouth at bedtime.     . fluticasone (FLONASE) 50 MCG/ACT nasal spray Place 2 sprays into both nostrils 2 (two) times daily. (Patient taking differently: Place 2 sprays into both nostrils as needed. ) 16 g 2  . levothyroxine (SYNTHROID, LEVOTHROID) 88 MCG tablet Take 88 mcg by mouth daily before breakfast.    . loratadine (CLARITIN) 10 MG tablet Take 10 mg by mouth daily as needed for allergies.     . metoprolol succinate (TOPROL-XL) 50 MG 24 hr tablet Take 50 mg by mouth every morning.     . Multiple Vitamins-Minerals (MULTIVITAMINS THER. W/MINERALS) TABS Take 1 tablet by mouth daily.      . naphazoline-pheniramine (NAPHCON-A) 0.025-0.3 % ophthalmic solution Place 1 drop into both eyes daily as needed for eye irritation.    Marland Kitchen olmesartan (BENICAR) 40 MG tablet Take 40 mg by mouth at bedtime.      No current facility-administered medications for this visit.     Social History   Socioeconomic History  . Marital status: Widowed    Spouse name: Not on file  . Number of children: 4  . Years of education: Not on file  . Highest education level: Not on file  Occupational History  . 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 Financial resource strain: Not on file  . Food insecurity    Worry: Not on file    Inability: Not on file  . Transportation needs    Medical: Not on file    Non-medical:  Not on file  Tobacco Use  . Smoking status: Never Smoker  . Smokeless tobacco: Never Used  Substance and Sexual Activity  . Alcohol use: No    Alcohol/week: 0.0 standard drinks  . Drug use: No  . Sexual activity: Not Currently  Lifestyle  . Physical activity    Days per week: Not on file    Minutes per session: Not on file  . Stress: Not on file  Relationships  . Social Herbalist on phone: Not on file    Gets together: Not on file    Attends religious service: Not on file    Active member of club or organization: Not on file    Attends meetings of clubs or organizations: Not on file    Relationship status: Not on file  . Intimate partner violence  Fear of current or ex partner: Not on file    Emotionally abused: Not on file    Physically abused: Not on file    Forced sexual activity: Not on file  Other Topics Concern  . Not on file  Social History Narrative  . Not on file    Family History  Problem Relation Age of Onset  . Diabetes Mother   . Stroke Mother   . Breast cancer Sister 1       breast ca  . Diabetes Sister   . Lung cancer Brother   . Stroke Brother   . Asthma Maternal Grandmother   . Melanoma Grandson 20  . Prostate cancer Nephew   . Cancer Cousin        mat first cousin, unknown cancer      Marti Sleigh, MD 12/25/2018, 11:34 AM   I discussed the assessment and treatment plan with the patient. The patient was provided an opportunity to ask questions and all were answered. The patient agreed with the plan and demonstrated an understanding of the instructions.   The patient was advised to call back or seek an in-person evaluation if the symptoms worsen or if the condition fails to improve as anticipated.  I provided 25 minutes of non-face-to-face time during this encounter.   Marti Sleigh, MD

## 2018-12-25 NOTE — Patient Instructions (Signed)
We will schedule a CA125 next week.  Return to see me in 3 months

## 2018-12-26 ENCOUNTER — Telehealth: Payer: Self-pay | Admitting: *Deleted

## 2018-12-26 DIAGNOSIS — Z0000 Encounter for general adult medical examination without abnormal findings: Secondary | ICD-10-CM | POA: Diagnosis not present

## 2018-12-26 DIAGNOSIS — G629 Polyneuropathy, unspecified: Secondary | ICD-10-CM | POA: Diagnosis not present

## 2018-12-26 DIAGNOSIS — Z1339 Encounter for screening examination for other mental health and behavioral disorders: Secondary | ICD-10-CM | POA: Diagnosis not present

## 2018-12-26 DIAGNOSIS — I129 Hypertensive chronic kidney disease with stage 1 through stage 4 chronic kidney disease, or unspecified chronic kidney disease: Secondary | ICD-10-CM | POA: Diagnosis not present

## 2018-12-26 DIAGNOSIS — Z1331 Encounter for screening for depression: Secondary | ICD-10-CM | POA: Diagnosis not present

## 2018-12-26 DIAGNOSIS — R809 Proteinuria, unspecified: Secondary | ICD-10-CM | POA: Diagnosis not present

## 2018-12-26 DIAGNOSIS — Z8543 Personal history of malignant neoplasm of ovary: Secondary | ICD-10-CM | POA: Diagnosis not present

## 2018-12-26 DIAGNOSIS — E785 Hyperlipidemia, unspecified: Secondary | ICD-10-CM | POA: Diagnosis not present

## 2018-12-26 DIAGNOSIS — N182 Chronic kidney disease, stage 2 (mild): Secondary | ICD-10-CM | POA: Diagnosis not present

## 2018-12-26 DIAGNOSIS — R7301 Impaired fasting glucose: Secondary | ICD-10-CM | POA: Diagnosis not present

## 2018-12-26 DIAGNOSIS — E039 Hypothyroidism, unspecified: Secondary | ICD-10-CM | POA: Diagnosis not present

## 2018-12-26 NOTE — Telephone Encounter (Signed)
Patient called and was scheduled for a lab appt

## 2018-12-26 NOTE — Telephone Encounter (Signed)
Per message from Dr. Fermin Schwab called and left the patient a message to call the office. Need to schedule the patient for a lab appt.

## 2018-12-27 ENCOUNTER — Other Ambulatory Visit: Payer: Self-pay | Admitting: Oncology

## 2018-12-27 ENCOUNTER — Other Ambulatory Visit: Payer: Self-pay

## 2018-12-27 ENCOUNTER — Inpatient Hospital Stay: Payer: Medicare Other

## 2018-12-27 DIAGNOSIS — C569 Malignant neoplasm of unspecified ovary: Secondary | ICD-10-CM | POA: Diagnosis not present

## 2018-12-28 LAB — CA 125: Cancer Antigen (CA) 125: 26.4 U/mL (ref 0.0–38.1)

## 2018-12-30 ENCOUNTER — Other Ambulatory Visit: Payer: Self-pay | Admitting: Gynecologic Oncology

## 2018-12-30 DIAGNOSIS — C562 Malignant neoplasm of left ovary: Secondary | ICD-10-CM

## 2018-12-31 ENCOUNTER — Telehealth: Payer: Self-pay

## 2018-12-31 NOTE — Telephone Encounter (Signed)
Told Ms [place]ihu[place] that Dr. Fermin Schwab wants to repeat the CA-125 in ~6 weeks.  The marker was 26.4 on 12-27-18. It is WNL but has doubled. Some times this elevation occurs and repeating the lab in a few weeks will help determine the plan of care. Pt verbalized understanding. Pt scheduled for lab on 02-10-19.

## 2019-01-06 ENCOUNTER — Emergency Department (HOSPITAL_COMMUNITY)
Admission: EM | Admit: 2019-01-06 | Discharge: 2019-01-06 | Discharge status: Against Medical Advice | Payer: Medicare Other | Attending: Emergency Medicine | Admitting: Emergency Medicine

## 2019-01-06 ENCOUNTER — Other Ambulatory Visit: Payer: Self-pay

## 2019-01-06 ENCOUNTER — Encounter (HOSPITAL_COMMUNITY): Payer: Self-pay | Admitting: Emergency Medicine

## 2019-01-06 DIAGNOSIS — R110 Nausea: Secondary | ICD-10-CM | POA: Diagnosis not present

## 2019-01-06 DIAGNOSIS — C569 Malignant neoplasm of unspecified ovary: Secondary | ICD-10-CM | POA: Diagnosis not present

## 2019-01-06 DIAGNOSIS — Z5321 Procedure and treatment not carried out due to patient leaving prior to being seen by health care provider: Secondary | ICD-10-CM | POA: Diagnosis not present

## 2019-01-06 DIAGNOSIS — R42 Dizziness and giddiness: Secondary | ICD-10-CM | POA: Diagnosis present

## 2019-01-06 LAB — I-STAT CHEM 8, ED
BUN: 16 mg/dL (ref 8–23)
Calcium, Ion: 1.15 mmol/L (ref 1.15–1.40)
Chloride: 104 mmol/L (ref 98–111)
Creatinine, Ser: 0.7 mg/dL (ref 0.44–1.00)
Glucose, Bld: 208 mg/dL — ABNORMAL HIGH (ref 70–99)
HCT: 39 % (ref 36.0–46.0)
Hemoglobin: 13.3 g/dL (ref 12.0–15.0)
Potassium: 3.5 mmol/L (ref 3.5–5.1)
Sodium: 139 mmol/L (ref 135–145)
TCO2: 21 mmol/L — ABNORMAL LOW (ref 22–32)

## 2019-01-06 LAB — CBC
HCT: 39.1 % (ref 36.0–46.0)
Hemoglobin: 12.5 g/dL (ref 12.0–15.0)
MCH: 29.1 pg (ref 26.0–34.0)
MCHC: 32 g/dL (ref 30.0–36.0)
MCV: 91.1 fL (ref 80.0–100.0)
Platelets: 252 10*3/uL (ref 150–400)
RBC: 4.29 MIL/uL (ref 3.87–5.11)
RDW: 14.1 % (ref 11.5–15.5)
WBC: 8.5 10*3/uL (ref 4.0–10.5)
nRBC: 0 % (ref 0.0–0.2)

## 2019-01-06 LAB — DIFFERENTIAL
Abs Immature Granulocytes: 0.05 10*3/uL (ref 0.00–0.07)
Basophils Absolute: 0.1 10*3/uL (ref 0.0–0.1)
Basophils Relative: 1 %
Eosinophils Absolute: 0.2 10*3/uL (ref 0.0–0.5)
Eosinophils Relative: 2 %
Immature Granulocytes: 1 %
Lymphocytes Relative: 20 %
Lymphs Abs: 1.7 10*3/uL (ref 0.7–4.0)
Monocytes Absolute: 0.8 10*3/uL (ref 0.1–1.0)
Monocytes Relative: 9 %
Neutro Abs: 5.8 10*3/uL (ref 1.7–7.7)
Neutrophils Relative %: 67 %

## 2019-01-06 LAB — COMPREHENSIVE METABOLIC PANEL
ALT: 18 U/L (ref 0–44)
AST: 28 U/L (ref 15–41)
Albumin: 3.9 g/dL (ref 3.5–5.0)
Alkaline Phosphatase: 81 U/L (ref 38–126)
Anion gap: 11 (ref 5–15)
BUN: 14 mg/dL (ref 8–23)
CO2: 21 mmol/L — ABNORMAL LOW (ref 22–32)
Calcium: 9.2 mg/dL (ref 8.9–10.3)
Chloride: 103 mmol/L (ref 98–111)
Creatinine, Ser: 0.9 mg/dL (ref 0.44–1.00)
GFR calc Af Amer: >60 mL/min (ref >60)
GFR calc non Af Amer: 59 mL/min — ABNORMAL LOW (ref >60)
Glucose, Bld: 210 mg/dL — ABNORMAL HIGH (ref 70–99)
Potassium: 3.4 mmol/L — ABNORMAL LOW (ref 3.5–5.1)
Sodium: 135 mmol/L (ref 135–145)
Total Bilirubin: 0.3 mg/dL (ref 0.3–1.2)
Total Protein: 7.1 g/dL (ref 6.5–8.1)

## 2019-01-06 LAB — PROTIME-INR
INR: 1 (ref 0.8–1.2)
Prothrombin Time: 13.5 seconds (ref 11.4–15.2)

## 2019-01-06 LAB — APTT: aPTT: 27 seconds (ref 24–36)

## 2019-01-06 MED ORDER — SODIUM CHLORIDE 0.9% FLUSH: 3.0000 mL | Freq: Once | INTRAVENOUS | Status: DC

## 2019-01-06 NOTE — ED Notes (Signed)
Pt states she is leaving she cant wait any longer and her children are waiting for her

## 2019-01-06 NOTE — ED Notes (Signed)
[place] pts daughter, update when available

## 2019-01-06 NOTE — ED Triage Notes (Signed)
Reports sudden onset of dizziness and nausea. Took something for nausea pta.  Reports feeling sleepy from that.  No neuro deficits noted in triage.

## 2019-02-10 ENCOUNTER — Other Ambulatory Visit: Payer: Self-pay

## 2019-02-10 ENCOUNTER — Inpatient Hospital Stay: Payer: Medicare Other | Attending: Gynecology

## 2019-02-10 DIAGNOSIS — C569 Malignant neoplasm of unspecified ovary: Secondary | ICD-10-CM | POA: Insufficient documentation

## 2019-02-10 DIAGNOSIS — C562 Malignant neoplasm of left ovary: Secondary | ICD-10-CM

## 2019-02-12 ENCOUNTER — Encounter: Payer: Self-pay | Admitting: Gynecology

## 2019-02-12 LAB — CA 125: Cancer Antigen (CA) 125: 49.1 U/mL — ABNORMAL HIGH (ref 0.0–38.1)

## 2019-02-13 ENCOUNTER — Telehealth: Payer: Self-pay | Admitting: Oncology

## 2019-02-13 ENCOUNTER — Other Ambulatory Visit: Payer: Self-pay | Admitting: Hematology and Oncology

## 2019-02-13 DIAGNOSIS — R1900 Intra-abdominal and pelvic swelling, mass and lump, unspecified site: Secondary | ICD-10-CM

## 2019-02-13 DIAGNOSIS — C562 Malignant neoplasm of left ovary: Secondary | ICD-10-CM

## 2019-02-13 NOTE — Telephone Encounter (Signed)
Shannon Ellis called back and was informed of CA 125 results and the plan for a CT scan.  She verbalized understanding and agreement.

## 2019-02-13 NOTE — Telephone Encounter (Signed)
Called Shannon Ellis back and scheduled labs for 02/18/19 at 10 am and appointment with Dr. Alvy Bimler on 02/20/19 at 1 pm.  She verbalized understanding and agreement.

## 2019-02-13 NOTE — Telephone Encounter (Signed)
[facility] and discussed her CA 125 results and that Dr. Alvy Bimler has ordered a CT scan.  Gave her central scheduling's number to call to have it scheduled.  Also discussed that the elevated results may indicate a recurrence and to think about whether she would have chemotherapy again.  Shannon Ellis said she will think about it and would like to see what the CT shows.  She also asked that we also call her daughter, Shannon Ellis, to discuss the results and plan going forward.  Left a message for Shannon Ellis and requested a return call.

## 2019-02-18 ENCOUNTER — Other Ambulatory Visit: Payer: Self-pay

## 2019-02-18 ENCOUNTER — Inpatient Hospital Stay: Payer: Medicare Other | Attending: Hematology and Oncology

## 2019-02-18 DIAGNOSIS — Z5112 Encounter for antineoplastic immunotherapy: Secondary | ICD-10-CM | POA: Diagnosis not present

## 2019-02-18 DIAGNOSIS — Z5111 Encounter for antineoplastic chemotherapy: Secondary | ICD-10-CM | POA: Diagnosis not present

## 2019-02-18 DIAGNOSIS — C562 Malignant neoplasm of left ovary: Secondary | ICD-10-CM | POA: Diagnosis not present

## 2019-02-18 DIAGNOSIS — Z79899 Other long term (current) drug therapy: Secondary | ICD-10-CM | POA: Diagnosis not present

## 2019-02-18 DIAGNOSIS — C786 Secondary malignant neoplasm of retroperitoneum and peritoneum: Secondary | ICD-10-CM | POA: Insufficient documentation

## 2019-02-18 DIAGNOSIS — Z23 Encounter for immunization: Secondary | ICD-10-CM | POA: Insufficient documentation

## 2019-02-18 LAB — CBC WITH DIFFERENTIAL/PLATELET
Abs Immature Granulocytes: 0.03 10*3/uL (ref 0.00–0.07)
Basophils Absolute: 0.1 10*3/uL (ref 0.0–0.1)
Basophils Relative: 1 %
Eosinophils Absolute: 0.3 10*3/uL (ref 0.0–0.5)
Eosinophils Relative: 4 %
HCT: 37.8 % (ref 36.0–46.0)
Hemoglobin: 12.1 g/dL (ref 12.0–15.0)
Immature Granulocytes: 0 %
Lymphocytes Relative: 26 %
Lymphs Abs: 1.8 10*3/uL (ref 0.7–4.0)
MCH: 29 pg (ref 26.0–34.0)
MCHC: 32 g/dL (ref 30.0–36.0)
MCV: 90.6 fL (ref 80.0–100.0)
Monocytes Absolute: 0.7 10*3/uL (ref 0.1–1.0)
Monocytes Relative: 10 %
Neutro Abs: 4.2 10*3/uL (ref 1.7–7.7)
Neutrophils Relative %: 59 %
Platelets: 281 10*3/uL (ref 150–400)
RBC: 4.17 MIL/uL (ref 3.87–5.11)
RDW: 14.2 % (ref 11.5–15.5)
WBC: 7 10*3/uL (ref 4.0–10.5)
nRBC: 0 % (ref 0.0–0.2)

## 2019-02-18 LAB — COMPREHENSIVE METABOLIC PANEL
ALT: 17 U/L (ref 0–44)
AST: 21 U/L (ref 15–41)
Albumin: 3.9 g/dL (ref 3.5–5.0)
Alkaline Phosphatase: 84 U/L (ref 38–126)
Anion gap: 7 (ref 5–15)
BUN: 10 mg/dL (ref 8–23)
CO2: 26 mmol/L (ref 22–32)
Calcium: 9.4 mg/dL (ref 8.9–10.3)
Chloride: 106 mmol/L (ref 98–111)
Creatinine, Ser: 0.81 mg/dL (ref 0.44–1.00)
GFR calc Af Amer: >60 mL/min (ref >60)
GFR calc non Af Amer: >60 mL/min (ref >60)
Glucose, Bld: 123 mg/dL — ABNORMAL HIGH (ref 70–99)
Potassium: 4 mmol/L (ref 3.5–5.1)
Sodium: 139 mmol/L (ref 135–145)
Total Bilirubin: 0.5 mg/dL (ref 0.3–1.2)
Total Protein: 7.2 g/dL (ref 6.5–8.1)

## 2019-02-19 ENCOUNTER — Telehealth: Payer: Self-pay | Admitting: Oncology

## 2019-02-19 ENCOUNTER — Ambulatory Visit (HOSPITAL_COMMUNITY)
Admission: RE | Admit: 2019-02-19 | Discharge: 2019-02-19 | Disposition: A | Discharge status: Home / Self Care | Payer: Medicare Other | Source: Ambulatory Visit | Attending: Hematology and Oncology | Admitting: Hematology and Oncology

## 2019-02-19 ENCOUNTER — Encounter (HOSPITAL_COMMUNITY): Payer: Self-pay

## 2019-02-19 DIAGNOSIS — R1900 Intra-abdominal and pelvic swelling, mass and lump, unspecified site: Secondary | ICD-10-CM | POA: Diagnosis not present

## 2019-02-19 DIAGNOSIS — C562 Malignant neoplasm of left ovary: Secondary | ICD-10-CM | POA: Diagnosis not present

## 2019-02-19 DIAGNOSIS — R590 Localized enlarged lymph nodes: Secondary | ICD-10-CM | POA: Diagnosis not present

## 2019-02-19 DIAGNOSIS — N289 Disorder of kidney and ureter, unspecified: Secondary | ICD-10-CM | POA: Diagnosis not present

## 2019-02-19 DIAGNOSIS — K6389 Other specified diseases of intestine: Secondary | ICD-10-CM | POA: Diagnosis not present

## 2019-02-19 IMAGING — CT CT ABD-PELV W/ CM
2 of 5 series · 15 of 46 positions shown, 17 images · IV contrast (OMNIPAQUE)
Comparison: CT the abdomen and pelvis [DATE].

CLINICAL DATA: 86-year-old female with history of recurrent ovarian
neoplasm. Chemotherapy complete. Follow-up study.

EXAM:
CT ABDOMEN AND PELVIS WITH CONTRAST
TECHNIQUE: Multidetector CT imaging of the abdomen and pelvis was performed
using the standard protocol following bolus administration of
intravenous contrast.
CONTRAST:  100mL OMNIPAQUE IOHEXOL 300 MG/ML  SOLN

[Series 2: axial st · axial · 0.72mm/px · z∈[-492,-122]mm · 12 of 88 slices shown, 14 images]
[im 7/88  soft-tissue]
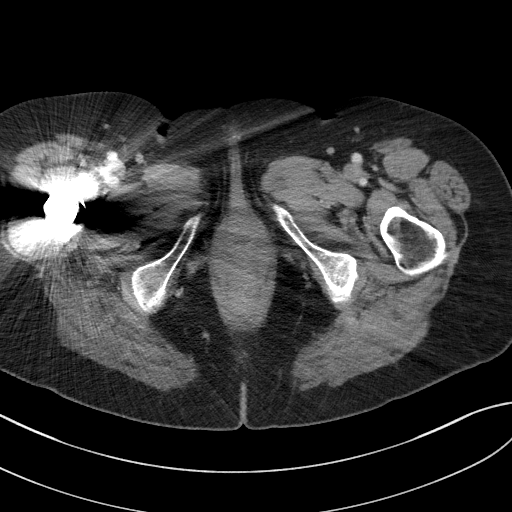
[im 7/88  bone]
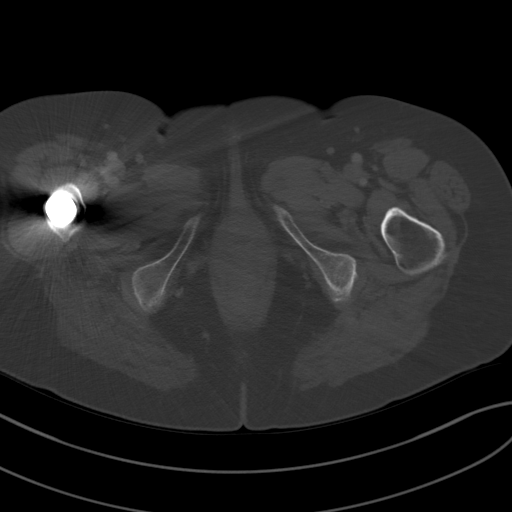
[im 14/88  soft-tissue]
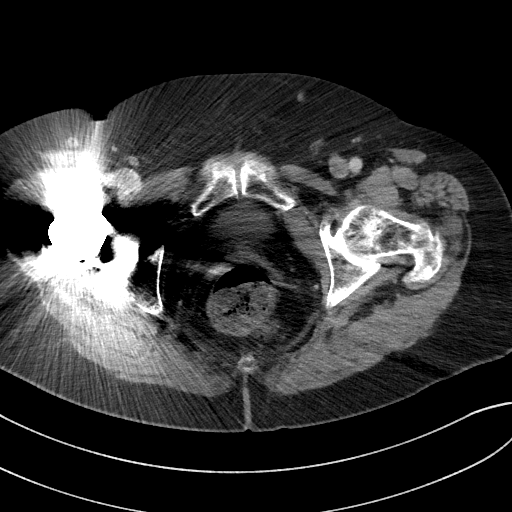
[im 21/88  soft-tissue]
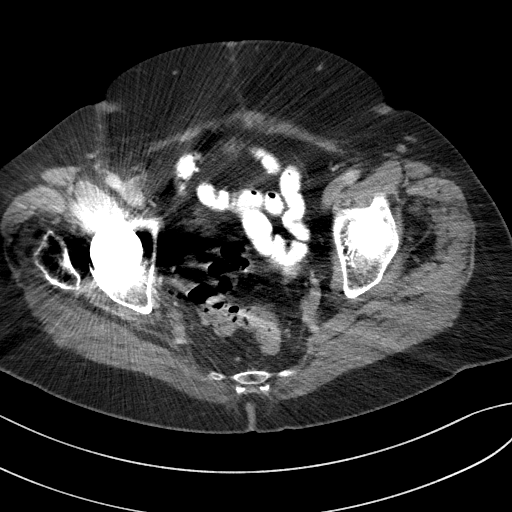
[im 27/88  soft-tissue]
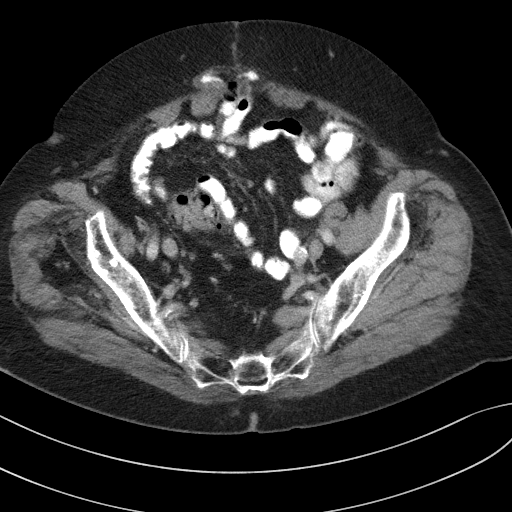
[im 34/88  soft-tissue]
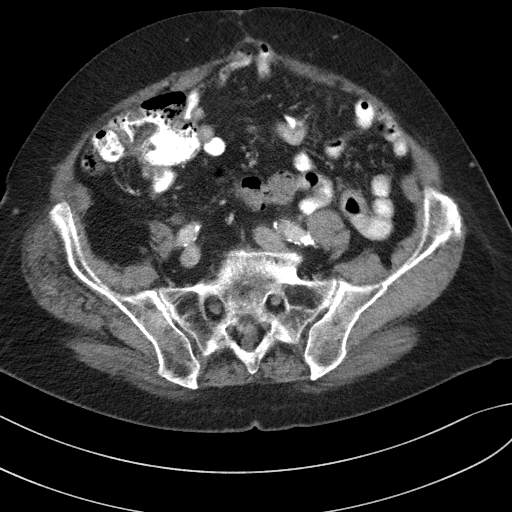
[im 41/88  soft-tissue]
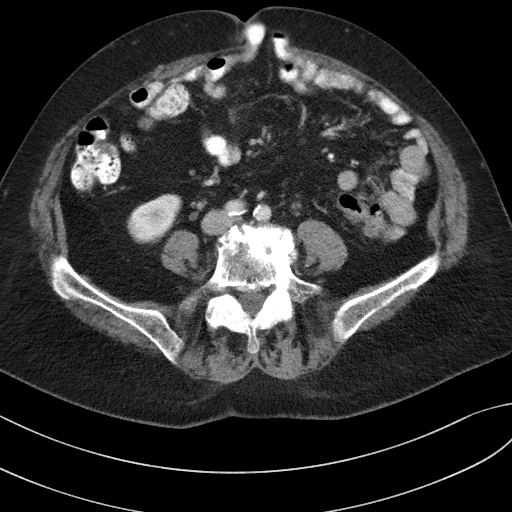
[im 47/88  soft-tissue]
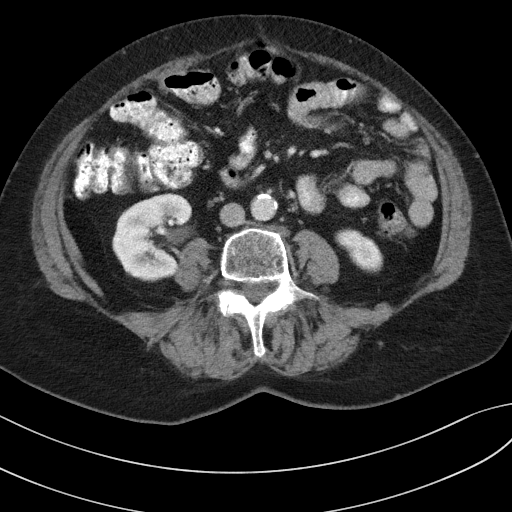
[im 54/88  soft-tissue]
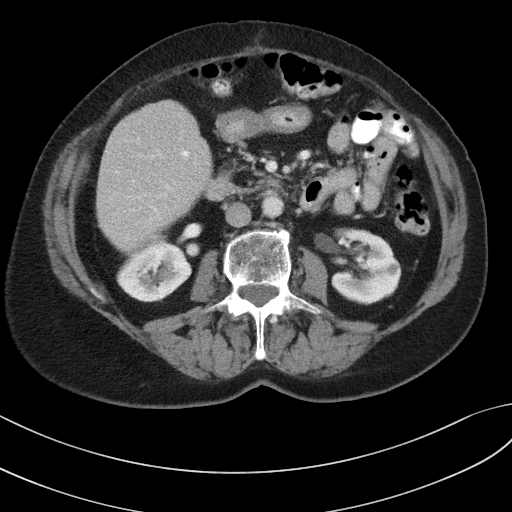
[im 61/88  soft-tissue]
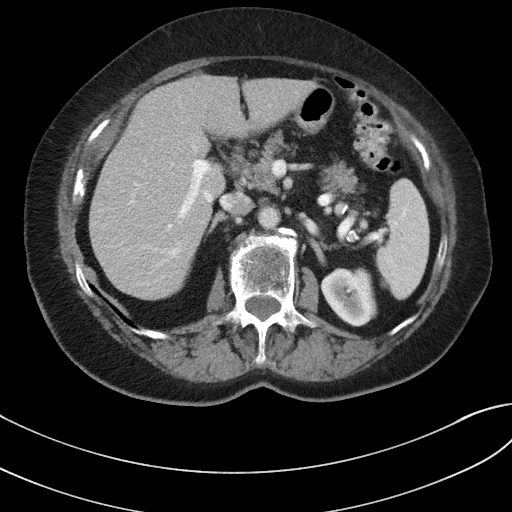
[im 61/88  bone]
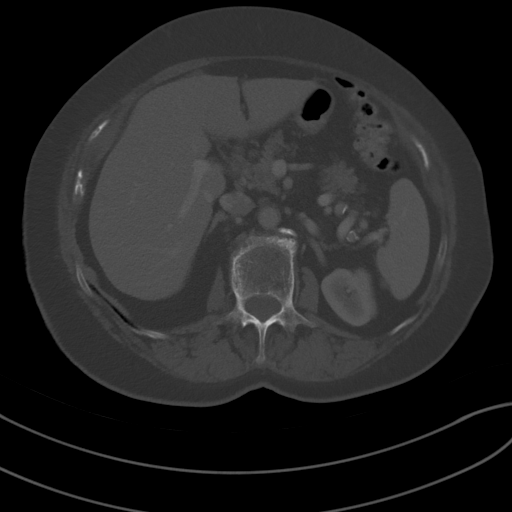
[im 67/88  soft-tissue]
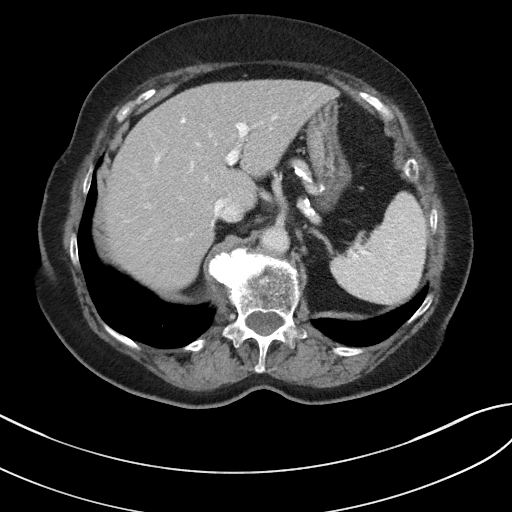
[im 74/88  soft-tissue]
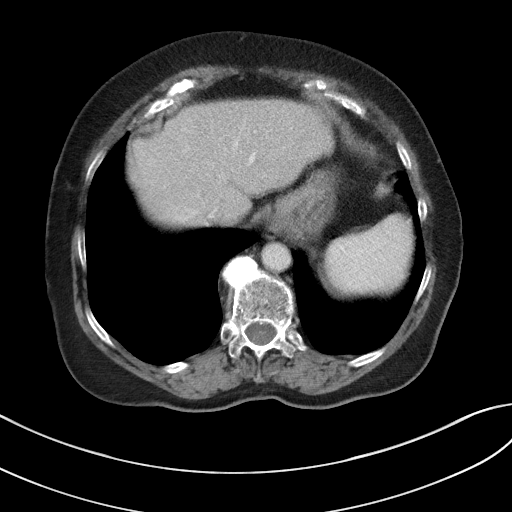
[im 81/88  soft-tissue]
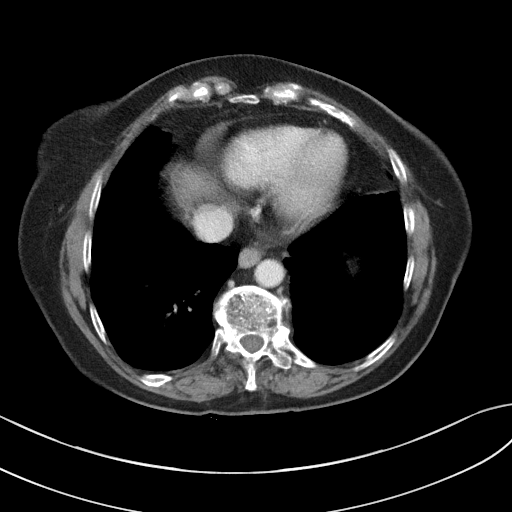

[Series 4: coronal st · coronal · 0.79mm/px · 3 of 105 slices shown]
[im 35/105  soft-tissue]
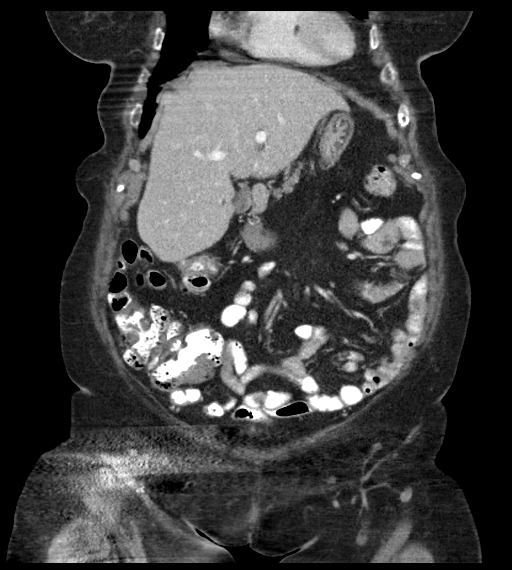
[im 47/105  soft-tissue]
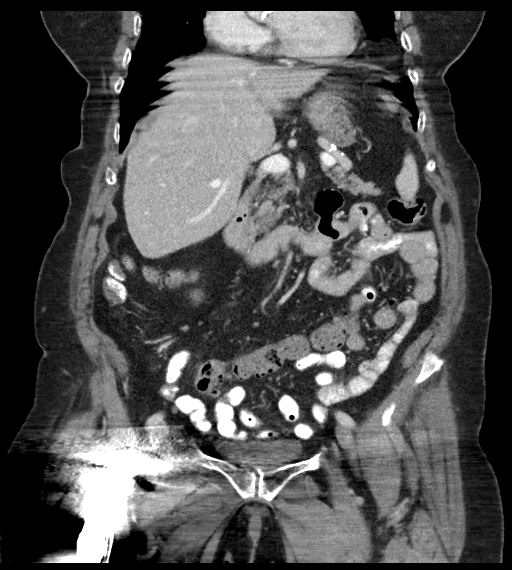
[im 58/105  soft-tissue]
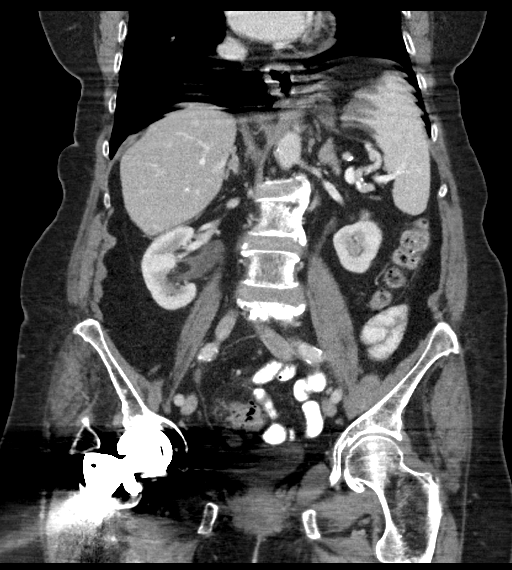

[15 of 46 positions shown; findings below may reference images not displayed]

FINDINGS: Lower chest: Severe calcifications of the aortic valve. Small hiatal
hernia.

Hepatobiliary: No suspicious cystic or solid hepatic lesions. No
intra or extrahepatic biliary ductal dilatation. Gallbladder is
normal in appearance.

Pancreas: No pancreatic mass. No pancreatic ductal dilatation. No
pancreatic or peripancreatic fluid collections or inflammatory
changes.

Spleen: Unremarkable.

Adrenals/Urinary Tract: Multiple subcentimeter low-attenuation
lesions in both kidneys, too small to characterize, but similar to
the prior study and statistically likely to represent tiny cysts. No
definite aggressive appearing renal lesions. Bilateral adrenal
glands are normal in appearance. No hydroureteronephrosis. Urinary
bladder is partially obscured by beam hardening artifact from the
patient's right hip arthroplasty, but is otherwise unremarkable in
appearance.

Stomach/Bowel: Normal appearance of the stomach. No pathologic
dilatation of small bowel or colon. Focal area of asymmetric mural
thickening in the mid to distal sigmoid colon (axial image 65 of
series 2) measuring 2.3 x 2.2 cm. The appendix is not confidently
identified and may be surgically absent. Regardless, there are no
inflammatory changes noted adjacent to the cecum to suggest the
presence of an acute appendicitis at this time.

Vascular/Lymphatic: Aortic atherosclerosis, without evidence of
aneurysm or dissection in the abdominal or pelvic vasculature. New
mildly enlarged left para-aortic lymph node (axial image 31 of
series 2). No other pathologically enlarged abdominal or pelvic
lymph nodes are noted.

Reproductive: Status post total abdominal hysterectomy and bilateral
salpingo-oophorectomy.

Other: Several small infraumbilical ventral hernias containing short
segments of mid to distal small bowel. No significant volume of
ascites. No pneumoperitoneum.

Musculoskeletal: Status post right hip arthroplasty. There are no
aggressive appearing lytic or blastic lesions noted in the
visualized portions of the skeleton.
IMPRESSION: 1. Interval development of mildly enlarged left para-aortic lymph
node measuring 1.0 cm in short axis. This is nonspecific, but the
possibility of nodal metastasis warrants consideration. Close
attention on follow-up studies is recommended.
2. No other signs of potential metastatic disease noted elsewhere in
the abdomen or pelvis.
3. However, there is a focal area of asymmetric mural thickening in
the mid to distal sigmoid colon measuring 2.3 x 2.2 cm. The
possibility of colonic neoplasm should be considered, and further
evaluation with nonemergent outpatient colonoscopy is suggested in
the near future to better evaluate this finding.
4. Several small infraumbilical ventral hernias containing short
segments of mid to distal small bowel, without evidence of
associated bowel incarceration or obstruction at this time.
5. There are calcifications of the aortic valve. Echocardiographic
correlation for evaluation of potential valvular dysfunction may be
warranted if clinically indicated.
6. Aortic atherosclerosis.
7. Additional incidental findings, as above.

## 2019-02-19 MED ORDER — IOHEXOL 300 MG/ML  SOLN
100.0000 mL | Freq: Once | INTRAMUSCULAR | Status: AC | PRN
  Administered 2019-02-19: 07:00:00 100 mL via INTRAVENOUS

## 2019-02-19 MED ORDER — SODIUM CHLORIDE (PF) 0.9 % IJ SOLN
INTRAMUSCULAR | Status: AC
  Filled 2019-02-19: qty 50

## 2019-02-19 NOTE — Telephone Encounter (Signed)
Called Shannon Ellis and asked if she would like to move her appointment tomorrow to the morning.  She said to check with her daughter Shannon Ellis to see if that would be ok with her work schedule.  Left a message with Shannon Ellis asking for a return call.

## 2019-02-20 ENCOUNTER — Ambulatory Visit: Payer: Medicare Other | Admitting: Hematology and Oncology

## 2019-02-20 ENCOUNTER — Other Ambulatory Visit: Payer: Self-pay

## 2019-02-20 ENCOUNTER — Encounter: Payer: Self-pay | Admitting: Hematology and Oncology

## 2019-02-20 ENCOUNTER — Inpatient Hospital Stay (HOSPITAL_BASED_OUTPATIENT_CLINIC_OR_DEPARTMENT_OTHER): Payer: Medicare Other | Admitting: Hematology and Oncology

## 2019-02-20 DIAGNOSIS — Z5111 Encounter for antineoplastic chemotherapy: Secondary | ICD-10-CM | POA: Diagnosis not present

## 2019-02-20 DIAGNOSIS — C562 Malignant neoplasm of left ovary: Secondary | ICD-10-CM

## 2019-02-20 DIAGNOSIS — C786 Secondary malignant neoplasm of retroperitoneum and peritoneum: Secondary | ICD-10-CM | POA: Diagnosis not present

## 2019-02-20 DIAGNOSIS — Z7189 Other specified counseling: Secondary | ICD-10-CM

## 2019-02-20 DIAGNOSIS — Z79899 Other long term (current) drug therapy: Secondary | ICD-10-CM | POA: Diagnosis not present

## 2019-02-20 DIAGNOSIS — Z5112 Encounter for antineoplastic immunotherapy: Secondary | ICD-10-CM | POA: Diagnosis not present

## 2019-02-20 DIAGNOSIS — Z23 Encounter for immunization: Secondary | ICD-10-CM | POA: Diagnosis not present

## 2019-02-20 NOTE — Assessment & Plan Note (Signed)
Unfortunately, the patient has relapse close to slightly over 6 months from her prior imaging Thankfully, she is currently not symptomatic Previously, the patient was quite adamant she does not want any further treatment and hence her port was extracted We discussed the risk and benefits of resuming chemotherapy using similar chemotherapeutic agents versus different treatment At this point, with early cancer relapse, her disease is considered noncurable Treatment goal is strictly palliative in nature She is undecided I will call her family early next week for further discussion about plan of care

## 2019-02-20 NOTE — Progress Notes (Signed)
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[facility]) Unfortunately, the patient has relapse close to slightly over 6 months from her prior imaging Thankfully, she is currently not symptomatic Previously, the patient was quite adamant she does not want any further treatment and hence her port was extracted We discussed the risk and benefits of resuming chemotherapy using similar chemotherapeutic agents versus different treatment At this point, with early cancer relapse, her disease is considered noncurable Treatment goal is strictly palliative in nature She is undecided I will call her family early next week for further discussion about plan of care  Goals of care, counseling/discussion She is currently asymptomatic We discussed what to expect should her cancer continues to progress without treatment We discussed prognosis will likely be 3 months give or take.   No orders of the defined types were placed in this encounter.   INTERVAL HISTORY: Please see below for problem oriented charting. She returns with her daughter for further follow-up due to her poor hearing and difficulties with understanding Since last time I saw her, she denies abdominal bloating, changes in bowel habits or abdominal pain She complains of mild fatigue Her appetite is good and she has gained some weight No recent infection, fever or chills The patient returns because of recent rising tumor marker.  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 Large complex left paramidline pelvic mass likely rises from the left ovary. Together with left periaortic adenopathy and peritoneal carcinomatosis, findings  are worrisome for metastatic ovarian cancer. 2. Small pelvic free fluid. 3.  Aortic atherosclerosis (ICD10-170.0).   01/21/2018 Pathology Results   Omentum, biopsy, dominant omental nodule within the right lower abdominal quadrant - POORLY DIFFERENTIATED CARCINOMA CONSISTENT WITH GYNECOLOGIC PRIMARY. - SEE MICROSCOPIC DESCRIPTION. Microscopic Comment The tumor is characterized by sheets of tumor cells with enlarged nuclei, many containing prominent nucleoli. There are patchy areas with eosinophilic cytoplasmic inclusions. Immunohistochemistry shows positivity with cytokeratin AE1/AE3, cytokeratin 7, estrogen receptor, progesterone receptor, PAX-8, WT-1, and placental alkaline phosphatase (PLAP). The tumor is negative with alpha fetoprotein, human chorionic gonadotropin, CD117, CD10, CD30, CD56, CDX-2, CEA, cytokeratin 20, S100, SOX-10, synaptophysin, chromogranin, D2-40, GATA-3, GCDFP, Glypican 3 and Inhibin. 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 The estrogen receptor is 100% with strong staining and the progesterone receptor is 90% with strong staining.   01/21/2018 Procedure   Technically successful CT guided core needle biopsy of dominant omental nodule within the right lower abdominal quadrant, adjacent to the cecum.   01/24/2018 Cancer Staging   Staging form: Ovary, Fallopian Tube, and Primary Peritoneal Carcinoma, AJCC 8th Edition - Clinical: Stage IIIC (cT3c, cN1b, cM0) - Signed by [name], MD on 01/24/2018   01/25/2018 Tumor Marker   Patient's tumor was tested for the following markers: AFP Results of the tumor marker test revealed 4.7   01/31/2018 Surgery   Pre-operative Diagnosis: Ovarian cancer NOS  Post-operative Diagnosis: At least Stage 3 ovarian cancer   Operation: Laparoscopic omental biopsy  Surgeon: Mart Piggs, MD  Specimens: Omentum  Operative Findings: Omental nodule >2cm. Large left pelvic mass  12-15cm with sigmoid draped medially and potentially adherent to colon.  The mass appeared to have a 3cm cystic area that was ruptured preoperatively. Uterus appears normal with small anterior fibroid ~1cm. Right adnexa nonenlarged. Small bowel without evidence of disease. No diaphragmatic disease.    01/31/2018 Pathology Results   Omentum, resection for tumor - HIGH GRADE CARCINOMA. - SEE MICROSCOPIC DESCRIPTION. Microscopic Comment This case is sent to Dr. Annitta Jersey for consultation. The case was discussed with Dr. Gerarda Fraction on 02/01/18. (JDP:kh 02/01/18) The omentum shows nodules of high grade carcinoma characterized by diffuse sheets of malignant cells with enlarged nuclei with prominent nucleoli. There is moderate to abundant eosinophilic cytoplasm and frequent mitotic figures. 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 The entire specimen is examined histologically and there are nodules of tumor throughout the specimen with identical morphology. Additional immunohistochemistry for hepatoid markers will be performed and reported as an addendum   02/11/2018 Procedure   Status post right IJ port catheter placement. Catheter ready for use.   02/14/2018 Tumor Marker   Patient's tumor was tested for the following markers: CA-125 Results of the tumor marker test revealed 1523   02/15/2018 -  Chemotherapy   The patient had carboplatin and taxol. Taxol dose reduced from cycle 2 onwards   02/15/2018 - 07/05/2018 Chemotherapy   The patient had carboplatin and taxol x 6   02/23/2018 Imaging   Interval development of abdominal ascites.  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  No significant change in  multilobulated complex left pelvic mass, which compresses the pelvic structures and displaces them to the right.  Interval development of mild right hydronephrosis and hydroureter, possibly postobstructive.   03/08/2018 Tumor Marker   Patient's tumor was tested for the following markers: CA-125 Results of the tumor marker test revealed 1844   03/29/2018 Tumor Marker   Patient's tumor was tested for the following markers: CA-125 Results of the tumor marker test revealed 1141   04/10/2018 Imaging   1. New omental nodule. Otherwise, left ovarian mass and right paracolic gutter nodule have decreased in size in the interval. 2. Small ascites, increased. 3. Endometrial cavity may be dilated and contains fluid. Please correlate clinically. 4. Mild prominence of the ureters bilaterally, possibly due to large left adnexal mass. 5. Aortic atherosclerosis (ICD10-170.0). Coronary artery calcification.   04/25/2018 Surgery   Procedure(s) Performed:   1. Exploratory laparotomy  2. TAH/Bilateral salpingo-oophorectomy with radical tumor debulking for ovarian cancer . 3. Omentectomy  Surgeon: Bernita Raisin, MD Specimens: Bilateral tubes and ovaries, omentum, sigmoid nodule, cecal nodule   Operative Findings: Large left sided ovarian tumor wedged retroperitoneal and inferiorly into pelvis with some adherence to sigmoid mesentary. Some tumor nodules on surface of sigmoid epiploica that was removed. ~1-2 cm tumor implant lateral to cecum also excised. No obvious omental disease. This represented an optimal cytoreduction (R0) with no gross visible disease remaining. No carcinomatosis. Diaphragms and small bowel /mesentary all free of disease.    04/25/2018 Pathology Results   1. 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 - PRESUMED OVARIAN SURFACE INVOLVEMENT. - DEFINITIVE FALLOPIAN TUBE NOT IDENTIFIED. - SEE COMMENT AND ONCOLOGY TABLE. 2. Uterus and  cervix, right tube and ovary - UTERUS: -ENDOMETRIUM: INACTIVE ENDOMETRIUM. NO HYPERPLASIA OR MALIGNANCY. -MYOMETRIUM:  LEIOMYOMA. NO MALIGNANCY. -SEROSA: UNREMARKABLE. NO MALIGNANCY. - CERVIX: BENIGN SQUAMOUS AND ENDOCERVICAL MUCOSA. NO DYSPLASIA OR MALIGNANCY. - RIGHT OVARY: UNREMARKABLE. NO MALIGNANCY. - RIGHT FALLOPIAN TUBES: METASTATIC CARCINOMA. 3. Soft tissue, biopsy, sigmoid implant - METASTATIC CARCINOMA. 4. Omentum, resection for tumor - METASTATIC CARCINOMA. 5. Soft tissue, biopsy, pelvic cecal implant - METASTATIC CARCINOMA. Microscopic Comment 1. OVARY or FALLOPIAN TUBE or PRIMARY PERITONEUM: Procedure: Left salpingo-oophorectomy. Hysterectomy and right salpingo-oophorectomy, omentectomy and implant biopsies. Specimen Integrity: Disrupted. Tumor Site: Left ovary. Ovarian Surface Involvement (required only if applicable): Presumed surface involvement, see comment. Fallopian Tube Surface Involvement (required only if applicable): Can not be determined, see comment. Tumor Size: 13.3 cm. Histologic Type: See comment. Histologic Grade: High grade. Implants (required for advanced stage serous/seromucinous borderline tumors only): Present. Other Tissue/ Organ Involvement: Right fallopian tube, sigmoid implant, omental implant, cecal implant. Largest Extrapelvic Peritoneal Focus (required only if applicable): 1.6 cm. Peritoneal/Ascitic Fluid: N/A. Treatment Effect (required only for high-grade serous carcinomas): Definitive effect not seen. Regional Lymph Nodes: No lymph nodes submitted or found. Pathologic Stage Classification (pTNM, AJCC 8th Edition): ypT3b, ypNX Representative Tumor Block: 1A-F Comment(s): Residual ovarian parenchyma is not identified and the entire mass consists of tumor, thus, surface involvement is presumably present. The left fallopian tube is not grossly or microscopically identified and thus involvement can not be assessed. A prior omental biopsy was sent  to Dr. Annitta Jersey for consultation and was diagnosed as high grade carcinoma with hepatoid features. The current tumor is compared to the biopsy and looks morphologically identical.   05/06/2018 Tumor Marker   Patient's tumor was tested for the following markers: CA-125 Results of the tumor marker test revealed 190   06/10/2018 Tumor Marker   Patient's tumor was tested for the following markers: CA-125 Results of the tumor marker test revealed 16.4   07/05/2018 Tumor Marker   Patient's tumor was tested for the following markers: CA-125 Results of the tumor marker test revealed 13.4   08/01/2018 Imaging   1. No evidence of metastatic disease in the chest, abdomen or pelvis. 2. Interval resection of the large left adnexal mass. Haziness of the lower mesenteric and distal pericolonic fat, favor postsurgical change. This study will serve as a new baseline for future surveillance. 3. Chronic findings include: Aortic Atherosclerosis (ICD10-I70.0). Small hiatal hernia. Moderate sigmoid diverticulosis.   08/01/2018 Tumor Marker   Patient's tumor was tested for the following markers: CA-125 Results of the tumor marker test revealed 12.1   08/26/2018 Procedure   Successful right IJ vein Port-A-Cath explant.   02/10/2019 Tumor Marker   Patient's tumor was tested for the following markers: CA-125 Results of the tumor marker test revealed 49.1   02/18/2019 Imaging   1. Interval development of mildly enlarged left para-aortic lymph node measuring 1.0 cm in short axis. This is nonspecific, but the possibility of nodal metastasis warrants consideration. Close attention on follow-up studies is recommended. 2. No other signs of potential metastatic disease noted elsewhere in the abdomen or pelvis. 3. However, there is a focal area of asymmetric mural thickening in the mid to distal sigmoid colon measuring 2.3 x 2.2 cm. The possibility of colonic neoplasm should be considered, and further evaluation with  nonemergent outpatient colonoscopy is suggested in the near future to better evaluate this finding. 4. Several small infraumbilical ventral hernias containing short segments of mid to distal small bowel, without evidence of associated bowel incarceration or obstruction at this time. 5. There are calcifications of the aortic valve. Echocardiographic  correlation for evaluation of potential valvular dysfunction may be warranted if clinically indicated. 6. Aortic atherosclerosis. 7. Additional incidental findings, as above.     REVIEW OF SYSTEMS:   Constitutional: Denies fevers, chills or abnormal weight loss Eyes: Denies blurriness of vision Ears, nose, mouth, throat, and face: Denies mucositis or sore throat Respiratory: Denies cough, dyspnea or wheezes Cardiovascular: Denies palpitation, chest discomfort or lower extremity swelling Gastrointestinal:  Denies nausea, heartburn or change in bowel habits Skin: Denies abnormal skin rashes Lymphatics: Denies new lymphadenopathy or easy bruising Neurological:Denies numbness, tingling or new weaknesses Behavioral/Psych: Mood is stable, no new changes  All other systems were reviewed with the patient and are negative.  I have reviewed the past medical history, past surgical history, social history and family history with the patient and they are unchanged from previous note.  ALLERGIES:  has No Known Allergies.  MEDICATIONS:  Current Outpatient Medications  Medication Sig Dispense Refill  . acetaminophen (TYLENOL) 500 MG tablet Take 500 mg by mouth every 8 (eight) hours as needed for mild pain or headache.     Marland Kitchen amLODipine (NORVASC) 5 MG tablet Take 5 mg by mouth at bedtime.     . fluticasone (FLONASE) 50 MCG/ACT nasal spray Place 2 sprays into both nostrils 2 (two) times daily. (Patient taking differently: Place 2 sprays into both nostrils as needed. ) 16 g 2  . levothyroxine (SYNTHROID, LEVOTHROID) 88 MCG tablet Take 88 mcg by mouth daily  before breakfast.    . loratadine (CLARITIN) 10 MG tablet Take 10 mg by mouth daily as needed for allergies.     . metoprolol succinate (TOPROL-XL) 50 MG 24 hr tablet Take 50 mg by mouth every morning.     . Multiple Vitamins-Minerals (MULTIVITAMINS THER. W/MINERALS) TABS Take 1 tablet by mouth daily.      . naphazoline-pheniramine (NAPHCON-A) 0.025-0.3 % ophthalmic solution Place 1 drop into both eyes daily as needed for eye irritation.    Marland Kitchen olmesartan (BENICAR) 40 MG tablet Take 40 mg by mouth at bedtime.      No current facility-administered medications for this visit.     PHYSICAL EXAMINATION: ECOG PERFORMANCE STATUS: 1 - Symptomatic but completely ambulatory  Vitals:   02/20/19 1031  BP: (!) 152/76  Pulse: 72  Resp: 18  Temp: 98.2 F (36.8 C)  SpO2: 98%   Filed Weights   02/20/19 1031  Weight: 178 lb 3.2 oz (80.8 kg)    GENERAL:alert, no distress and comfortable Musculoskeletal:no cyanosis of digits and no clubbing  NEURO: alert & oriented x 3 with fluent speech, no focal motor/sensory deficits  LABORATORY DATA:  I have reviewed the data as listed    Component Value Date/Time   NA 139 02/18/2019 1006   K 4.0 02/18/2019 1006   CL 106 02/18/2019 1006   CO2 26 02/18/2019 1006   GLUCOSE 123 (H) 02/18/2019 1006   BUN 10 02/18/2019 1006   CREATININE 0.81 02/18/2019 1006   CREATININE 0.72 07/15/2018 1010   CALCIUM 9.4 02/18/2019 1006   PROT 7.2 02/18/2019 1006   ALBUMIN 3.9 02/18/2019 1006   AST 21 02/18/2019 1006   AST 21 07/15/2018 1010   ALT 17 02/18/2019 1006   ALT 22 07/15/2018 1010   ALKPHOS 84 02/18/2019 1006   BILITOT 0.5 02/18/2019 1006   BILITOT 0.3 07/15/2018 1010   GFRNONAA >60 02/18/2019 1006   GFRNONAA >60 07/15/2018 1010   GFRAA >60 02/18/2019 1006   GFRAA >60 07/15/2018 1010  No results found for: SPEP, UPEP  Lab Results  Component Value Date   WBC 7.0 02/18/2019   NEUTROABS 4.2 02/18/2019   HGB 12.1 02/18/2019   HCT 37.8 02/18/2019    MCV 90.6 02/18/2019   PLT 281 02/18/2019      Chemistry      Component Value Date/Time   NA 139 02/18/2019 1006   K 4.0 02/18/2019 1006   CL 106 02/18/2019 1006   CO2 26 02/18/2019 1006   BUN 10 02/18/2019 1006   CREATININE 0.81 02/18/2019 1006   CREATININE 0.72 07/15/2018 1010      Component Value Date/Time   CALCIUM 9.4 02/18/2019 1006   ALKPHOS 84 02/18/2019 1006   AST 21 02/18/2019 1006   AST 21 07/15/2018 1010   ALT 17 02/18/2019 1006   ALT 22 07/15/2018 1010   BILITOT 0.5 02/18/2019 1006   BILITOT 0.3 07/15/2018 1010       RADIOGRAPHIC STUDIES: I have reviewed multiple imaging studies with the patient and family I have personally reviewed the radiological images as listed and agreed with the findings in the report. Ct Abdomen Pelvis W Contrast  Result Date: 02/19/2019 CLINICAL DATA:  85 year old female with history of recurrent ovarian neoplasm. Chemotherapy complete. Follow-up study. EXAM: CT ABDOMEN AND PELVIS WITH CONTRAST TECHNIQUE: Multidetector CT imaging of the abdomen and pelvis was performed using the standard protocol following bolus administration of intravenous contrast. CONTRAST:  180m OMNIPAQUE IOHEXOL 300 MG/ML  SOLN COMPARISON:  CT the abdomen and pelvis 08/01/2018. FINDINGS: Lower chest: Severe calcifications of the aortic valve. Small hiatal hernia. Hepatobiliary: No suspicious cystic or solid hepatic lesions. No intra or extrahepatic biliary ductal dilatation. Gallbladder is normal in appearance. Pancreas: No pancreatic mass. No pancreatic ductal dilatation. No pancreatic or peripancreatic fluid collections or inflammatory changes. Spleen: Unremarkable. Adrenals/Urinary Tract: Multiple subcentimeter low-attenuation lesions in both kidneys, too small to characterize, but similar to the prior study and statistically likely to represent tiny cysts. No definite aggressive appearing renal lesions. Bilateral adrenal glands are normal in appearance. No  hydroureteronephrosis. Urinary bladder is partially obscured by beam hardening artifact from the patient's right hip arthroplasty, but is otherwise unremarkable in appearance. Stomach/Bowel: Normal appearance of the stomach. No pathologic dilatation of small bowel or colon. Focal area of asymmetric mural thickening in the mid to distal sigmoid colon (axial image 65 of series 2) measuring 2.3 x 2.2 cm. The appendix is not confidently identified and may be surgically absent. Regardless, there are no inflammatory changes noted adjacent to the cecum to suggest the presence of an acute appendicitis at this time. Vascular/Lymphatic: Aortic atherosclerosis, without evidence of aneurysm or dissection in the abdominal or pelvic vasculature. New mildly enlarged left para-aortic lymph node (axial image 31 of series 2). No other pathologically enlarged abdominal or pelvic lymph nodes are noted. Reproductive: Status post total abdominal hysterectomy and bilateral salpingo-oophorectomy. Other: Several small infraumbilical ventral hernias containing short segments of mid to distal small bowel. No significant volume of ascites. No pneumoperitoneum. Musculoskeletal: Status post right hip arthroplasty. There are no aggressive appearing lytic or blastic lesions noted in the visualized portions of the skeleton. IMPRESSION: 1. Interval development of mildly enlarged left para-aortic lymph node measuring 1.0 cm in short axis. This is nonspecific, but the possibility of nodal metastasis warrants consideration. Close attention on follow-up studies is recommended. 2. No other signs of potential metastatic disease noted elsewhere in the abdomen or pelvis. 3. However, there is a focal area of asymmetric mural thickening  in the mid to distal sigmoid colon measuring 2.3 x 2.2 cm. The possibility of colonic neoplasm should be considered, and further evaluation with nonemergent outpatient colonoscopy is suggested in the near future to better  evaluate this finding. 4. Several small infraumbilical ventral hernias containing short segments of mid to distal small bowel, without evidence of associated bowel incarceration or obstruction at this time. 5. There are calcifications of the aortic valve. Echocardiographic correlation for evaluation of potential valvular dysfunction may be warranted if clinically indicated. 6. Aortic atherosclerosis. 7. Additional incidental findings, as above. Electronically Signed   By: Vinnie Langton M.D.   On: 02/19/2019 08:49    All questions were answered. The patient knows to call the clinic with any problems, questions or concerns. No barriers to learning was detected.  I spent 30 minutes counseling the patient face to face. The total time spent in the appointment was 40 minutes and more than 50% was on counseling and review of test results  [name], MD 02/20/2019 12:33 PM

## 2019-02-20 NOTE — Assessment & Plan Note (Signed)
She is currently asymptomatic We discussed what to expect should her cancer continues to progress without treatment We discussed prognosis will likely be 3 months give or take.

## 2019-02-25 ENCOUNTER — Telehealth: Payer: Self-pay | Admitting: Oncology

## 2019-02-25 ENCOUNTER — Other Ambulatory Visit: Payer: Self-pay | Admitting: Hematology and Oncology

## 2019-02-25 DIAGNOSIS — C562 Malignant neoplasm of left ovary: Secondary | ICD-10-CM

## 2019-02-25 NOTE — Telephone Encounter (Signed)
I spoke with Daughter, placed port order. Please try to get that scheduled this week and get IR to draw CBC-d and CMP Her daughter will call back with final chemo decisions

## 2019-02-25 NOTE — Telephone Encounter (Signed)
Shannon Ellis with appointment for port insertion on 02/28/19 at [facility] IR with arrival at 9:00 am (npo after midnight and needs a driver). She verbalized understanding and agreement.

## 2019-02-25 NOTE — Telephone Encounter (Addendum)
[name] called and asked how soon Shannon Ellis can be scheduled for a porta cath and also how the cancer will be monitored.  Kelly's phone number is [phone].

## 2019-02-25 NOTE — Telephone Encounter (Signed)
Called [name] back to see if they have made a decision on chemotherapy. She said Catrina has decided on carboplatin and Doxil. Advised her that Dr. Alvy Bimler will be notified.

## 2019-02-25 NOTE — Telephone Encounter (Signed)
Shannon Ellis called and asked if they can go ahead and schedule an echo so that chemo can start next week.    Carita's other daughter, [name] also want to clarify the success rates for the 3 chemotherapy options.  They are trying to explain it to [facility] and want to make sure they understand it correctly.

## 2019-02-26 ENCOUNTER — Other Ambulatory Visit: Payer: Self-pay | Admitting: Hematology and Oncology

## 2019-02-26 ENCOUNTER — Telehealth: Payer: Self-pay | Admitting: Oncology

## 2019-02-26 ENCOUNTER — Telehealth: Payer: Self-pay | Admitting: Hematology and Oncology

## 2019-02-26 DIAGNOSIS — C562 Malignant neoplasm of left ovary: Secondary | ICD-10-CM

## 2019-02-26 DIAGNOSIS — Z5111 Encounter for antineoplastic chemotherapy: Secondary | ICD-10-CM

## 2019-02-26 DIAGNOSIS — Z7189 Other specified counseling: Secondary | ICD-10-CM

## 2019-02-26 MED ORDER — ONDANSETRON HCL 8 MG PO TABS: 8.0000 mg | ORAL_TABLET | Freq: Three times a day (TID) | ORAL | 1 refills | Status: DC | PRN

## 2019-02-26 MED ORDER — LIDOCAINE-PRILOCAINE 2.5-2.5 % EX CREA: TOPICAL_CREAM | CUTANEOUS | 3 refills | Status: DC

## 2019-02-26 MED ORDER — PROCHLORPERAZINE MALEATE 10 MG PO TABS: 10.0000 mg | ORAL_TABLET | Freq: Four times a day (QID) | ORAL | 1 refills | Status: DC | PRN

## 2019-02-26 MED FILL — ONDANSETRON HCL 8 MG TABLET: 8 | 21 days supply | Qty: 18 | Fill #0

## 2019-02-26 MED FILL — LIDOCAINE-PRILOCAINE CREAM: 2.5-2.5 | 10 days supply | Qty: 30 | Fill #0

## 2019-02-26 MED FILL — PROCHLORPERAZINE 10 MG TAB: 10 | 7 days supply | Qty: 30 | Fill #0

## 2019-02-26 NOTE — Telephone Encounter (Signed)
Called daughter - no answer. Left message with appt date and time per 9/9 sch message.

## 2019-02-26 NOTE — Telephone Encounter (Signed)
I ordered ECHO: please schedule I placed orders for chemo scheduling; I plan to see her on 9/16 for final consent before chemo same day Please get IR to draw CBC and CMP on Friday Please ask daughter where she wants me to send in prescriptions for EMLA, compazine and zofran

## 2019-02-26 NOTE — Telephone Encounter (Signed)
Called Shannon Ellis and notified her of echo appointment on 03/03/19 at 38 am. Cassia will need to check in at [facility].  Also asked where they would like prescriptions sent for EMLA, Zofran and Compazine and she said to [facility].

## 2019-02-26 NOTE — Telephone Encounter (Signed)
done

## 2019-02-26 NOTE — Telephone Encounter (Signed)
Shannon Ellis called and wanted to review appointments.  Advised him what has been scheduled so far and that chemotherapy will be scheduled to start 03/05/19 with an appointment to see Dr. Alvy Bimler before to sign consents.  Advised that a scheduler will call with the appointments on 03/05/19.  He verbalized understanding and agreement.

## 2019-02-26 NOTE — Progress Notes (Signed)
DISCONTINUE ON PATHWAY REGIMEN - Ovarian     A cycle is every 21 days:     Paclitaxel      Carboplatin   **Always confirm dose/schedule in your pharmacy ordering system**  REASON: Other Reason PRIOR TREATMENT: [id number]: Carboplatin AUC=6 + Paclitaxel 175 mg/m2 q21 Days x 2-4 Cycles TREATMENT RESPONSE: Progressive Disease (PD)  START ON PATHWAY REGIMEN - Ovarian     A cycle is every 28 days:     Carboplatin      Liposomal doxorubicin   **Always confirm dose/schedule in your pharmacy ordering system**  Patient Characteristics: Recurrent or Progressive Disease, Second Line Therapy, Platinum Sensitive and ? 6 Months Since Last Therapy Therapeutic Status: Recurrent or Progressive Disease BRCA Mutation Status: Absent Line of Therapy: Second Line  Intent of Therapy: Non-Curative / Palliative Intent, Discussed with Patient

## 2019-02-27 ENCOUNTER — Telehealth: Payer: Self-pay | Admitting: Oncology

## 2019-02-27 ENCOUNTER — Other Ambulatory Visit: Payer: Self-pay | Admitting: Student

## 2019-02-27 ENCOUNTER — Other Ambulatory Visit: Payer: Self-pay | Admitting: Radiology

## 2019-02-27 NOTE — Telephone Encounter (Signed)
Merry Proud called and wanted to review instructions for the port insertion tomorrow.  Advised him that Lesleyann needs to be NPO after midnight and that she will need a driver.    He also said that they threw a surprise party for Kiri's birthday on Tuesday and she was very angry and he thinks it may be her way of venting her anger from her cancer recurrence.  Asked if she would be interested in counseling with Art Buff at the [facility].  He said he will talk to [facility] and get back to [place].

## 2019-02-28 ENCOUNTER — Telehealth: Payer: Self-pay | Admitting: Oncology

## 2019-02-28 ENCOUNTER — Ambulatory Visit (HOSPITAL_COMMUNITY)
Admission: RE | Admit: 2019-02-28 | Discharge: 2019-02-28 | Disposition: A | Discharge status: Home / Self Care | Payer: Medicare Other | Source: Ambulatory Visit | Attending: Hematology and Oncology | Admitting: Hematology and Oncology

## 2019-02-28 ENCOUNTER — Other Ambulatory Visit: Payer: Self-pay

## 2019-02-28 ENCOUNTER — Encounter (HOSPITAL_COMMUNITY): Payer: Self-pay

## 2019-02-28 DIAGNOSIS — E039 Hypothyroidism, unspecified: Secondary | ICD-10-CM | POA: Insufficient documentation

## 2019-02-28 DIAGNOSIS — C562 Malignant neoplasm of left ovary: Secondary | ICD-10-CM

## 2019-02-28 DIAGNOSIS — Z79899 Other long term (current) drug therapy: Secondary | ICD-10-CM | POA: Diagnosis not present

## 2019-02-28 DIAGNOSIS — Z452 Encounter for adjustment and management of vascular access device: Secondary | ICD-10-CM | POA: Diagnosis not present

## 2019-02-28 DIAGNOSIS — Z79890 Hormone replacement therapy (postmenopausal): Secondary | ICD-10-CM | POA: Insufficient documentation

## 2019-02-28 DIAGNOSIS — I10 Essential (primary) hypertension: Secondary | ICD-10-CM | POA: Insufficient documentation

## 2019-02-28 HISTORY — PX: IR IMAGING GUIDED PORT INSERTION: IMG5740

## 2019-02-28 LAB — CBC WITH DIFFERENTIAL/PLATELET
Abs Immature Granulocytes: 0.04 10*3/uL (ref 0.00–0.07)
Basophils Absolute: 0.1 10*3/uL (ref 0.0–0.1)
Basophils Relative: 1 %
Eosinophils Absolute: 0.3 10*3/uL (ref 0.0–0.5)
Eosinophils Relative: 4 %
HCT: 39.9 % (ref 36.0–46.0)
Hemoglobin: 12.8 g/dL (ref 12.0–15.0)
Immature Granulocytes: 1 %
Lymphocytes Relative: 27 %
Lymphs Abs: 2 10*3/uL (ref 0.7–4.0)
MCH: 29.1 pg (ref 26.0–34.0)
MCHC: 32.1 g/dL (ref 30.0–36.0)
MCV: 90.7 fL (ref 80.0–100.0)
Monocytes Absolute: 0.7 10*3/uL (ref 0.1–1.0)
Monocytes Relative: 10 %
Neutro Abs: 4.2 10*3/uL (ref 1.7–7.7)
Neutrophils Relative %: 57 %
Platelets: 300 10*3/uL (ref 150–400)
RBC: 4.4 MIL/uL (ref 3.87–5.11)
RDW: 14.5 % (ref 11.5–15.5)
WBC: 7.3 10*3/uL (ref 4.0–10.5)
nRBC: 0 % (ref 0.0–0.2)

## 2019-02-28 LAB — COMPREHENSIVE METABOLIC PANEL
ALT: 19 U/L (ref 0–44)
AST: 27 U/L (ref 15–41)
Albumin: 4.3 g/dL (ref 3.5–5.0)
Alkaline Phosphatase: 82 U/L (ref 38–126)
Anion gap: 7 (ref 5–15)
BUN: 15 mg/dL (ref 8–23)
CO2: 24 mmol/L (ref 22–32)
Calcium: 9.2 mg/dL (ref 8.9–10.3)
Chloride: 107 mmol/L (ref 98–111)
Creatinine, Ser: 0.64 mg/dL (ref 0.44–1.00)
GFR calc Af Amer: >60 mL/min (ref >60)
GFR calc non Af Amer: >60 mL/min (ref >60)
Glucose, Bld: 127 mg/dL — ABNORMAL HIGH (ref 70–99)
Potassium: 4.2 mmol/L (ref 3.5–5.1)
Sodium: 138 mmol/L (ref 135–145)
Total Bilirubin: 0.8 mg/dL (ref 0.3–1.2)
Total Protein: 7.7 g/dL (ref 6.5–8.1)

## 2019-02-28 LAB — PROTIME-INR
INR: 1.1 (ref 0.8–1.2)
Prothrombin Time: 13.8 seconds (ref 11.4–15.2)

## 2019-02-28 MED ORDER — CEFAZOLIN SODIUM-DEXTROSE 2-4 GM/100ML-% IV SOLN
2.0000 g | INTRAVENOUS | Status: AC
  Administered 2019-02-28: 11:00:00 2 g via INTRAVENOUS

## 2019-02-28 MED ORDER — LIDOCAINE-EPINEPHRINE 1 %-1:100000 IJ SOLN
INTRAMUSCULAR | Status: AC
  Filled 2019-02-28: qty 1

## 2019-02-28 MED ORDER — SODIUM CHLORIDE 0.9 % IV SOLN
INTRAVENOUS | Status: DC
  Administered 2019-02-28: 09:00:00 via INTRAVENOUS

## 2019-02-28 MED ORDER — MIDAZOLAM HCL 2 MG/2ML IJ SOLN
INTRAMUSCULAR | Status: AC | PRN
  Administered 2019-02-28 (×2): 0.5 mg via INTRAVENOUS
  Administered 2019-02-28: 1 mg via INTRAVENOUS

## 2019-02-28 MED ORDER — FENTANYL CITRATE (PF) 100 MCG/2ML IJ SOLN
INTRAMUSCULAR | Status: AC | PRN
  Administered 2019-02-28: 50 ug via INTRAVENOUS

## 2019-02-28 MED ORDER — MIDAZOLAM HCL 2 MG/2ML IJ SOLN
INTRAMUSCULAR | Status: AC
  Filled 2019-02-28: qty 2

## 2019-02-28 MED ORDER — HEPARIN SOD (PORK) LOCK FLUSH 100 UNIT/ML IV SOLN
INTRAVENOUS | Status: AC
  Filled 2019-02-28: qty 5

## 2019-02-28 MED ORDER — FENTANYL CITRATE (PF) 100 MCG/2ML IJ SOLN
INTRAMUSCULAR | Status: AC
  Filled 2019-02-28: qty 2

## 2019-02-28 MED ORDER — CEFAZOLIN SODIUM-DEXTROSE 2-4 GM/100ML-% IV SOLN
INTRAVENOUS | Status: AC
  Administered 2019-02-28: 11:00:00 2 g via INTRAVENOUS
  Filled 2019-02-28: qty 100

## 2019-02-28 MED ORDER — LIDOCAINE-EPINEPHRINE 1 %-1:100000 IJ SOLN
INTRAMUSCULAR | Status: AC | PRN
  Administered 2019-02-28: 10 mL

## 2019-02-28 NOTE — Telephone Encounter (Signed)
No need dexamethasone before chemo

## 2019-02-28 NOTE — H&P (Signed)
Referring Physician(s): [name]  Supervising Physician: Jacqulynn Cadet  Patient Status:  [facility]  Chief Complaint:  "I'm here for another port a cath"  Subjective: Patient familiar to IR service from omental nodule biopsy in 2019, Port-A-Cath placement in 2019 and Port-A-Cath removal on 08/26/18.  She has a history of recurrent ovarian cancer and presents again today for new Port-A-Cath placement for palliative chemotherapy.  She denies fever, headache, chest pain, dyspnea, cough, back pain, nausea, vomiting or bleeding.  She does have some abdominal "tightness".  Past Medical History:  Diagnosis Date  . Allergy   . Anemia   . Blood transfusion without reported diagnosis   . Cancer ([place])   . Complication of anesthesia    waking up-does not take much medication  . Family history of adverse reaction to anesthesia    problems waking up as does not need much medication  . Family history of breast cancer   . Family history of melanoma   . Family history of prostate cancer   . Hypertension   . Hypothyroidism   . Osteoporosis 10/07   Dexa scan  . PONV (postoperative nausea and vomiting)    Past Surgical History:  Procedure Laterality Date  . APPENDECTOMY  1950   not ruptured  . DEBULKING N/A 04/25/2018   Procedure: DEBULKING;  Surgeon: [name], MD;  Location: [facility];  Service: Gynecology;  Laterality: N/A;  . HYSTERECTOMY ABDOMINAL WITH SALPINGO-OOPHORECTOMY Bilateral 04/25/2018   Procedure: HYSTERECTOMY ABDOMINAL WITH BILATERAL SALPINGO-OOPHORECTOMY;  Surgeon: [name], MD;  Location: [facility];  Service: Gynecology;  Laterality: Bilateral;  . IR IMAGING GUIDED PORT INSERTION  02/11/2018  . IR REMOVAL TUN ACCESS W/ PORT W/O FL MOD SED  08/26/2018  . LAPAROSCOPY N/A 01/31/2018   Procedure: LAPAROSCOPY DIAGNOSTIC WITH BIOPSY;  Surgeon: [name], MD;  Location: [facility];  Service: Gynecology;  Laterality: N/A;  . OMENTECTOMY N/A 04/25/2018   Procedure:  OMENTECTOMY;  Surgeon: [name], MD;  Location: [facility];  Service: Gynecology;  Laterality: N/A;  . ovarian biopsy    . THYROIDECTOMY    . TONSILLECTOMY    . TOTAL HIP ARTHROPLASTY Right       Allergies: Patient has no known allergies.  Medications: Prior to Admission medications   Medication Sig Start Date End Date Taking? Authorizing Provider  levothyroxine (SYNTHROID, LEVOTHROID) 88 MCG tablet Take 88 mcg by mouth daily before breakfast.   Yes [provider]  acetaminophen (TYLENOL) 500 MG tablet Take 500 mg by mouth every 8 (eight) hours as needed for mild pain or headache.     [provider]  amLODipine (NORVASC) 5 MG tablet Take 5 mg by mouth at bedtime.     [provider]  fluticasone (FLONASE) 50 MCG/ACT nasal spray Place 2 sprays into both nostrils 2 (two) times daily. Patient taking differently: Place 2 sprays into both nostrils as needed.  06/13/18   Tanner, Lyndon Code., PA-C  lidocaine-prilocaine (EMLA) cream Apply to affected area once 02/26/19   [name], MD  loratadine (CLARITIN) 10 MG tablet Take 10 mg by mouth daily as needed for allergies.     [provider]  metoprolol succinate (TOPROL-XL) 50 MG 24 hr tablet Take 50 mg by mouth every morning.  10/15/17   [provider]  Multiple Vitamins-Minerals (MULTIVITAMINS THER. W/MINERALS) TABS Take 1 tablet by mouth daily.      [provider]  naphazoline-pheniramine (NAPHCON-A) 0.025-0.3 % ophthalmic solution Place  1 drop into both eyes daily as needed for eye irritation.    [provider]  olmesartan (BENICAR) 40 MG tablet Take 40 mg by mouth at bedtime.     [provider]  ondansetron (ZOFRAN) 8 MG tablet Take 1 tablet (8 mg total) by mouth every 8 (eight) hours as needed. 02/26/19   [name], MD  prochlorperazine (COMPAZINE) 10 MG tablet Take 1 tablet (10 mg total) by mouth every 6 (six) hours as needed (Nausea or vomiting). 02/26/19   [name], MD     Vital Signs: Blood pressure 154/71, heart rate 66, temp 98.3, respirations 16, O2 sat 98% room air   Physical Exam awake, alert.  Chest clear to auscultation bilaterally.  Heart with regular rate and rhythm.  Abdomen soft, positive bowel sounds, not significantly tender but does feel "tight" per pt.  Extremities with no significant edema  Imaging: No results found.  Labs:  CBC: Recent Labs    08/26/18 1043 01/06/19 1918 01/06/19 1936 02/18/19 1006 02/28/19 0910  WBC 6.0 8.5  --  7.0 7.3  HGB 12.4 12.5 13.3 12.1 12.8  HCT 39.6 39.1 39.0 37.8 39.9  PLT 243 252  --  281 300    COAGS: Recent Labs    08/26/18 1043 01/06/19 1918 02/28/19 0910  INR 1.1 1.0 1.1  APTT  --  27  --     BMP: Recent Labs    08/01/18 0935 08/26/18 1043 01/06/19 1918 01/06/19 1936 02/18/19 1006  NA 140 139 135 139 139  K 3.8 3.7 3.4* 3.5 4.0  CL 107 107 103 104 106  CO2 27 23 21*  --  26  GLUCOSE 108* 114* 210* 208* 123*  BUN 11 17 14 16 10   CALCIUM 9.7 9.4 9.2  --  9.4  CREATININE 0.69 0.64 0.90 0.70 0.81  GFRNONAA >60 >60 59*  --  >60  GFRAA >60 >60 >60  --  >60    LIVER FUNCTION TESTS: Recent Labs    07/15/18 1010 08/01/18 0935 01/06/19 1918 02/18/19 1006  BILITOT 0.3 0.5 0.3 0.5  AST 21 26 28 21   ALT 22 28 18 17   ALKPHOS 87 94 81 84  PROT 7.3 7.4 7.1 7.2  ALBUMIN 3.8 3.8 3.9 3.9    Assessment and Plan: Pt with history of recurrent ovarian cancer ; presents  today for new Port-A-Cath placement for palliative chemotherapy. Risks and benefits of image guided port-a-catheter placement was discussed with the patient including, but not limited to bleeding, infection, pneumothorax, or fibrin sheath development and need for additional procedures.  All of the patient's questions were answered, patient is agreeable to proceed. Consent signed and in chart.     Electronically Signed: D. Rowe Robert, PA-C 02/28/2019, 9:48 AM   I spent a total of 25 minutes at  the the patient's bedside AND on the patient's hospital floor or unit, greater than 50% of which was counseling/coordinating care for port a cath placement

## 2019-02-28 NOTE — Procedures (Signed)
Interventional Radiology Procedure Note  Procedure: Placement of a right IJ approach single lumen PowerPort.  Tip is positioned at the superior cavoatrial junction and catheter is ready for immediate use.  Complications: No immediate Recommendations:  - Ok to shower tomorrow - Do not submerge for 7 days - Routine line care   Signed,  Heath K. McCullough, MD   

## 2019-02-28 NOTE — Discharge Instructions (Signed)
Implanted Port Insertion, Care After °This sheet gives you information about how to care for yourself after your procedure. Your health care provider may also give you more specific instructions. If you have problems or questions, contact your health care provider. °What can I expect after the procedure? °After the procedure, it is common to have: °· Discomfort at the port insertion site. °· Bruising on the skin over the port. This should improve over 3-4 days. °Follow these instructions at home: °Port care °· After your port is placed, you will get a manufacturer's information card. The card has information about your port. Keep this card with you at all times. °· Take care of the port as told by your health care provider. Ask your health care provider if you or a family member can get training for taking care of the port at home. A home health care nurse may also take care of the port. °· Make sure to remember what type of port you have. °Incision care ° °  ° °· Follow instructions from your health care provider about how to take care of your port insertion site. Make sure you: °? Wash your hands with soap and water before and after you change your bandage (dressing). If soap and water are not available, use hand sanitizer. °? Change your dressing as told by your health care provider. °? Leave stitches (sutures), skin glue, or adhesive strips in place. These skin closures may need to stay in place for 2 weeks or longer. If adhesive strip edges start to loosen and curl up, you may trim the loose edges. Do not remove adhesive strips completely unless your health care provider tells you to do that. °· Check your port insertion site every day for signs of infection. Check for: °? Redness, swelling, or pain. °? Fluid or blood. °? Warmth. °? Pus or a bad smell. °Activity °· Return to your normal activities as told by your health care provider. Ask your health care provider what activities are safe for you. °· Do not  lift anything that is heavier than 10 lb (4.5 kg), or the limit that you are told, until your health care provider says that it is safe. °General instructions °· Take over-the-counter and prescription medicines only as told by your health care provider. °· Do not take baths, swim, or use a hot tub until your health care provider approves. Ask your health care provider if you may take showers. You may only be allowed to take sponge baths. °· Do not drive for 24 hours if you were given a sedative during your procedure. °· Wear a medical alert bracelet in case of an emergency. This will tell any health care providers that you have a port. °· Keep all follow-up visits as told by your health care provider. This is important. °Contact a health care provider if: °· You cannot flush your port with saline as directed, or you cannot draw blood from the port. °· You have a fever or chills. °· You have redness, swelling, or pain around your port insertion site. °· You have fluid or blood coming from your port insertion site. °· Your port insertion site feels warm to the touch. °· You have pus or a bad smell coming from the port insertion site. °Get help right away if: °· You have chest pain or shortness of breath. °· You have bleeding from your port that you cannot control. °Summary °· Take care of the port as told by your health   care provider. Keep the manufacturer's information card with you at all times. °· Change your dressing as told by your health care provider. °· Contact a health care provider if you have a fever or chills or if you have redness, swelling, or pain around your port insertion site. °· Keep all follow-up visits as told by your health care provider. °This information is not intended to replace advice given to you by your health care provider. Make sure you discuss any questions you have with your health care provider. °Document Released: 03/26/2013 Document Revised: 01/01/2018 Document Reviewed:  01/01/2018 °Elsevier Patient Education © 2020 [employer] ° °Moderate Conscious Sedation, Adult, Care After °These instructions provide you with information about caring for yourself after your procedure. Your health care provider may also give you more specific instructions. Your treatment has been planned according to current medical practices, but problems sometimes occur. Call your health care provider if you have any problems or questions after your procedure. °What can I expect after the procedure? °After your procedure, it is common: °· To feel sleepy for several hours. °· To feel clumsy and have poor balance for several hours. °· To have poor judgment for several hours. °· To vomit if you eat too soon. °Follow these instructions at home: °For at least 24 hours after the procedure: ° °· Do not: °? Participate in activities where you could fall or become injured. °? Drive. °? Use heavy machinery. °? Drink alcohol. °? Take sleeping pills or medicines that cause drowsiness. °? Make important decisions or sign legal documents. °? Take care of children on your own. °· Rest. °Eating and drinking °· Follow the diet recommended by your health care provider. °· If you vomit: °? Drink water, juice, or soup when you can drink without vomiting. °? Make sure you have little or no nausea before eating solid foods. °General instructions °· Have a responsible adult stay with you until you are awake and alert. °· Take over-the-counter and prescription medicines only as told by your health care provider. °· If you smoke, do not smoke without supervision. °· Keep all follow-up visits as told by your health care provider. This is important. °Contact a health care provider if: °· You keep feeling nauseous or you keep vomiting. °· You feel light-headed. °· You develop a rash. °· You have a fever. °Get help right away if: °· You have trouble breathing. °This information is not intended to replace advice given to you by your  health care provider. Make sure you discuss any questions you have with your health care provider. °Document Released: 03/26/2013 Document Revised: 05/18/2017 Document Reviewed: 09/25/2015 °Elsevier Patient Education © 2020 [employer] ° °

## 2019-02-28 NOTE — Telephone Encounter (Signed)
Patient's son, Merry Proud, call and wants to make sure she does not need to take decadron before chemotherapy.

## 2019-02-28 NOTE — Telephone Encounter (Signed)
Called Shannon Ellis and advised him that Truman does not need to take decadron before chemo.  He verbalized agreement.

## 2019-03-03 ENCOUNTER — Ambulatory Visit (HOSPITAL_COMMUNITY)
Admission: RE | Admit: 2019-03-03 | Discharge: 2019-03-03 | Disposition: A | Discharge status: Home / Self Care | Payer: Medicare Other | Source: Ambulatory Visit | Attending: Hematology and Oncology | Admitting: Hematology and Oncology

## 2019-03-03 ENCOUNTER — Telehealth: Payer: Self-pay | Admitting: Oncology

## 2019-03-03 ENCOUNTER — Other Ambulatory Visit: Payer: Self-pay

## 2019-03-03 DIAGNOSIS — E039 Hypothyroidism, unspecified: Secondary | ICD-10-CM | POA: Insufficient documentation

## 2019-03-03 DIAGNOSIS — Z5111 Encounter for antineoplastic chemotherapy: Secondary | ICD-10-CM

## 2019-03-03 DIAGNOSIS — C562 Malignant neoplasm of left ovary: Secondary | ICD-10-CM | POA: Diagnosis not present

## 2019-03-03 DIAGNOSIS — I348 Other nonrheumatic mitral valve disorders: Secondary | ICD-10-CM | POA: Diagnosis not present

## 2019-03-03 DIAGNOSIS — I10 Essential (primary) hypertension: Secondary | ICD-10-CM | POA: Diagnosis not present

## 2019-03-03 NOTE — Progress Notes (Signed)
  Echocardiogram 2D Echocardiogram has been performed.  Eluzer Howdeshell G Sirron Francesconi 03/03/2019, 12:06 PM

## 2019-03-03 NOTE — Telephone Encounter (Signed)
[name] called and asked if her brother Shannon Ellis could attend the appointment with Dr. Alvy Bimler on Wednesday since she will not be able to attend. Advised her that we will ask out Surveyor, quantity for permission and call her back.

## 2019-03-03 NOTE — Telephone Encounter (Signed)
Called Shannon Ellis and left her a message advising her that it has been approved for Merry Proud to accompany Nyree for her appointment on Wednesday.

## 2019-03-05 ENCOUNTER — Inpatient Hospital Stay (HOSPITAL_BASED_OUTPATIENT_CLINIC_OR_DEPARTMENT_OTHER): Payer: Medicare Other | Admitting: Hematology and Oncology

## 2019-03-05 ENCOUNTER — Other Ambulatory Visit: Payer: Self-pay | Admitting: *Deleted

## 2019-03-05 ENCOUNTER — Other Ambulatory Visit: Payer: Self-pay

## 2019-03-05 ENCOUNTER — Inpatient Hospital Stay: Payer: Medicare Other

## 2019-03-05 VITALS — BP 149/73

## 2019-03-05 DIAGNOSIS — C562 Malignant neoplasm of left ovary: Secondary | ICD-10-CM

## 2019-03-05 DIAGNOSIS — Z7189 Other specified counseling: Secondary | ICD-10-CM

## 2019-03-05 DIAGNOSIS — Z5112 Encounter for antineoplastic immunotherapy: Secondary | ICD-10-CM | POA: Diagnosis not present

## 2019-03-05 DIAGNOSIS — C786 Secondary malignant neoplasm of retroperitoneum and peritoneum: Secondary | ICD-10-CM | POA: Diagnosis not present

## 2019-03-05 DIAGNOSIS — Z5111 Encounter for antineoplastic chemotherapy: Secondary | ICD-10-CM | POA: Diagnosis not present

## 2019-03-05 DIAGNOSIS — R1011 Right upper quadrant pain: Secondary | ICD-10-CM

## 2019-03-05 DIAGNOSIS — Z23 Encounter for immunization: Secondary | ICD-10-CM

## 2019-03-05 DIAGNOSIS — Z79899 Other long term (current) drug therapy: Secondary | ICD-10-CM | POA: Diagnosis not present

## 2019-03-05 MED ORDER — SODIUM CHLORIDE 0.9 % IV SOLN
387.5000 mg | Freq: Once | INTRAVENOUS | Status: AC
  Administered 2019-03-05: 390 mg via INTRAVENOUS
  Filled 2019-03-05: qty 39

## 2019-03-05 MED ORDER — SODIUM CHLORIDE 0.9% FLUSH
10.0000 mL | INTRAVENOUS | Status: DC | PRN
  Administered 2019-03-05: 10 mL
  Filled 2019-03-05: qty 10

## 2019-03-05 MED ORDER — PALONOSETRON HCL INJECTION 0.25 MG/5ML
INTRAVENOUS | Status: AC
  Filled 2019-03-05: qty 5

## 2019-03-05 MED ORDER — PALONOSETRON HCL INJECTION 0.25 MG/5ML
0.2500 mg | Freq: Once | INTRAVENOUS | Status: AC
  Administered 2019-03-05: 12:00:00 0.25 mg via INTRAVENOUS

## 2019-03-05 MED ORDER — DEXTROSE 5 % IV SOLN
Freq: Once | INTRAVENOUS | Status: AC
  Administered 2019-03-05: 12:00:00 via INTRAVENOUS
  Filled 2019-03-05: qty 250

## 2019-03-05 MED ORDER — ONDANSETRON 8 MG PO TBDP: 8.0000 mg | ORAL_TABLET | Freq: Three times a day (TID) | ORAL | 0 refills | Status: DC | PRN

## 2019-03-05 MED ORDER — INFLUENZA VAC A&B SA ADJ QUAD 0.5 ML IM PRSY
PREFILLED_SYRINGE | INTRAMUSCULAR | Status: AC
  Filled 2019-03-05: qty 0.5

## 2019-03-05 MED ORDER — HEPARIN SOD (PORK) LOCK FLUSH 100 UNIT/ML IV SOLN
500.0000 [IU] | Freq: Once | INTRAVENOUS | Status: AC | PRN
  Administered 2019-03-05: 500 [IU]
  Filled 2019-03-05: qty 5

## 2019-03-05 MED ORDER — DOXORUBICIN HCL LIPOSOMAL CHEMO INJECTION 2 MG/ML
31.0000 mg/m2 | Freq: Once | INTRAVENOUS | Status: AC
  Administered 2019-03-05: 60 mg via INTRAVENOUS
  Filled 2019-03-05: qty 30

## 2019-03-05 MED ORDER — SODIUM CHLORIDE 0.9 % IV SOLN
Freq: Once | INTRAVENOUS | Status: AC
  Administered 2019-03-05: 13:00:00 via INTRAVENOUS
  Filled 2019-03-05: qty 5

## 2019-03-05 MED ORDER — INFLUENZA VAC A&B SA ADJ QUAD 0.5 ML IM PRSY
0.5000 mL | PREFILLED_SYRINGE | Freq: Once | INTRAMUSCULAR | Status: AC
  Administered 2019-03-05: 16:00:00 0.5 mL via INTRAMUSCULAR

## 2019-03-05 MED FILL — ONDANSETRON ODT 8 MG TABLET: 8 | 21 days supply | Qty: 18 | Fill #0

## 2019-03-05 NOTE — Patient Instructions (Signed)
[facility] Discharge Instructions for Patients Receiving Chemotherapy  Today you received the following chemotherapy agents: Doxil, Carboplatin   To help prevent nausea and vomiting after your treatment, we encourage you to take your nausea medication as directed.    If you develop nausea and vomiting that is not controlled by your nausea medication, call the clinic.   BELOW ARE SYMPTOMS THAT SHOULD BE REPORTED IMMEDIATELY:  *FEVER GREATER THAN 100.5 F  *CHILLS WITH OR WITHOUT FEVER  NAUSEA AND VOMITING THAT IS NOT CONTROLLED WITH YOUR NAUSEA MEDICATION  *UNUSUAL SHORTNESS OF BREATH  *UNUSUAL BRUISING OR BLEEDING  TENDERNESS IN MOUTH AND THROAT WITH OR WITHOUT PRESENCE OF ULCERS  *URINARY PROBLEMS  *BOWEL PROBLEMS  UNUSUAL RASH Items with * indicate a potential emergency and should be followed up as soon as possible.  Feel free to call the clinic should you have any questions or concerns. The clinic phone number is [phone].  Please show the CHEMO ALERT CARD at check-in to the Emergency Department and triage nurse.  Doxorubicin Liposomal injection What is this medicine? LIPOSOMAL DOXORUBICIN (LIP oh som al dox oh ROO bi sin) is a chemotherapy drug. This medicine is used to treat many kinds of cancer like Kaposi's sarcoma, multiple myeloma, and ovarian cancer. This medicine may be used for other purposes; ask your health care provider or pharmacist if you have questions. COMMON BRAND NAME(S): Doxil, Lipodox What should I tell my health care provider before I take this medicine? They need to know if you have any of these conditions:  blood disorders  heart disease  infection (especially a virus infection such as chickenpox, cold sores, or herpes)  liver disease  recent or ongoing radiation therapy  an unusual or allergic reaction to doxorubicin, other chemotherapy agents, soybeans, other medicines, foods, dyes, or preservatives  pregnant or  trying to get pregnant  breast-feeding How should I use this medicine? This drug is given as an infusion into a vein. It is administered in a hospital or clinic by a specially trained health care professional. If you have pain, swelling, burning or any unusual feeling around the site of your injection, tell your health care professional right away. Talk to your pediatrician regarding the use of this medicine in children. Special care may be needed. Overdosage: If you think you have taken too much of this medicine contact a poison control center or emergency room at once. NOTE: This medicine is only for you. Do not share this medicine with others. What if I miss a dose? It is important not to miss your dose. Call your doctor or health care professional if you are unable to keep an appointment. What may interact with this medicine? Do not take this medicine with any of the following medications:  zidovudine This medicine may also interact with the following medications:  medicines to increase blood counts like filgrastim, pegfilgrastim, sargramostim  vaccines Talk to your doctor or health care professional before taking any of these medicines:  acetaminophen  aspirin  ibuprofen  ketoprofen  naproxen This list may not describe all possible interactions. Give your health care provider a list of all the medicines, herbs, non-prescription drugs, or dietary supplements you use. Also tell them if you smoke, drink alcohol, or use illegal drugs. Some items may interact with your medicine. What should I watch for while using this medicine? Your condition will be monitored carefully while you are receiving this medicine. You may need blood work done while you are   taking this medicine. This drug may make you feel generally unwell. This is not uncommon, as chemotherapy can affect healthy cells as well as cancer cells. Report any side effects. Continue your course of treatment even though you feel  ill unless your doctor tells you to stop. Your urine may turn orange-red for a few days after your dose. This is not blood. If your urine is dark or brown, call your doctor. In some cases, you may be given additional medicines to help with side effects. Follow all directions for their use. Talk to your doctor about your risk of cancer. You may be more at risk for certain types of cancers if you take this medicine. Do not become pregnant while taking this medicine or for 6 months after stopping it. Women should inform their healthcare professional if they wish to become pregnant or think they may be pregnant. Men should not father a child while taking this medicine and for 6 months after stopping it. There is a potential for serious side effects to an unborn child. Talk to your health care professional or pharmacist for more information. Do not breast-feed an infant while taking this medicine. This medicine has caused ovarian failure in some women. This medicine may make it more difficult to get pregnant. Talk to your healthcare professional if you are concerned about your fertility. This medicine has caused decreased sperm counts in some men. This may make it more difficult to father a child. Talk to your healthcare professional if you are concerned about your fertility. This medicine may cause a decrease in Co-Enzyme Q-10. You should make sure that you get enough Co-Enzyme Q-10 while you are taking this medicine. Discuss the foods you eat and the vitamins you take with your health care professional. What side effects may I notice from receiving this medicine? Side effects that you should report to your doctor or health care professional as soon as possible:  allergic reactions like skin rash, itching or hives, swelling of the face, lips, or tongue  low blood counts - this medicine may decrease the number of white blood cells, red blood cells and platelets. You may be at increased risk for infections  and bleeding.  signs of hand-foot syndrome - tingling or burning, redness, flaking, swelling, small blisters, or small sores on the palms of your hands or the soles of your feet  signs of infection - fever or chills, cough, sore throat, pain or difficulty passing urine  signs of decreased platelets or bleeding - bruising, pinpoint red spots on the skin, black, tarry stools, blood in the urine  signs of decreased red blood cells - unusually weak or tired, fainting spells, lightheadedness  back pain, chills, facial flushing, fever, headache, tightness in the chest or throat during the infusion  breathing problems  chest pain  fast, irregular heartbeat  mouth pain, redness, sores  pain, swelling, redness at site where injected  pain, tingling, numbness in the hands or feet  swelling of ankles, feet, or hands  vomiting Side effects that usually do not require medical attention (report to your doctor or health care professional if they continue or are bothersome):  diarrhea  hair loss  loss of appetite  nail discoloration or damage  nausea  red or watery eyes  red colored urine  stomach upset This list may not describe all possible side effects. Call your doctor for medical advice about side effects. You may report side effects to FDA at 1-800-FDA-1088. Where should I keep   my medicine? This drug is given in a hospital or clinic and will not be stored at home. NOTE: This sheet is a summary. It may not cover all possible information. If you have questions about this medicine, talk to your doctor, pharmacist, or health care provider.  2020 [employer]/Gold Standard (2018-02-11 15:13:26)  Carboplatin injection What is this medicine? CARBOPLATIN (KAR boe pla tin) is a chemotherapy drug. It targets fast dividing cells, like cancer cells, and causes these cells to die. This medicine is used to treat ovarian cancer and many other cancers. This medicine may be used for other  purposes; ask your health care provider or pharmacist if you have questions. COMMON BRAND NAME(S): Paraplatin What should I tell my health care provider before I take this medicine? They need to know if you have any of these conditions:  blood disorders  hearing problems  kidney disease  recent or ongoing radiation therapy  an unusual or allergic reaction to carboplatin, cisplatin, other chemotherapy, other medicines, foods, dyes, or preservatives  pregnant or trying to get pregnant  breast-feeding How should I use this medicine? This drug is usually given as an infusion into a vein. It is administered in a hospital or clinic by a specially trained health care professional. Talk to your pediatrician regarding the use of this medicine in children. Special care may be needed. Overdosage: If you think you have taken too much of this medicine contact a poison control center or emergency room at once. NOTE: This medicine is only for you. Do not share this medicine with others. What if I miss a dose? It is important not to miss a dose. Call your doctor or health care professional if you are unable to keep an appointment. What may interact with this medicine?  medicines for seizures  medicines to increase blood counts like filgrastim, pegfilgrastim, sargramostim  some antibiotics like amikacin, gentamicin, neomycin, streptomycin, tobramycin  vaccines Talk to your doctor or health care professional before taking any of these medicines:  acetaminophen  aspirin  ibuprofen  ketoprofen  naproxen This list may not describe all possible interactions. Give your health care provider a list of all the medicines, herbs, non-prescription drugs, or dietary supplements you use. Also tell them if you smoke, drink alcohol, or use illegal drugs. Some items may interact with your medicine. What should I watch for while using this medicine? Your condition will be monitored carefully while you  are receiving this medicine. You will need important blood work done while you are taking this medicine. This drug may make you feel generally unwell. This is not uncommon, as chemotherapy can affect healthy cells as well as cancer cells. Report any side effects. Continue your course of treatment even though you feel ill unless your doctor tells you to stop. In some cases, you may be given additional medicines to help with side effects. Follow all directions for their use. Call your doctor or health care professional for advice if you get a fever, chills or sore throat, or other symptoms of a cold or flu. Do not treat yourself. This drug decreases your body's ability to fight infections. Try to avoid being around people who are sick. This medicine may increase your risk to bruise or bleed. Call your doctor or health care professional if you notice any unusual bleeding. Be careful brushing and flossing your teeth or using a toothpick because you may get an infection or bleed more easily. If you have any dental work done, tell your dentist   you are receiving this medicine. Avoid taking products that contain aspirin, acetaminophen, ibuprofen, naproxen, or ketoprofen unless instructed by your doctor. These medicines may hide a fever. Do not become pregnant while taking this medicine. Women should inform their doctor if they wish to become pregnant or think they might be pregnant. There is a potential for serious side effects to an unborn child. Talk to your health care professional or pharmacist for more information. Do not breast-feed an infant while taking this medicine. What side effects may I notice from receiving this medicine? Side effects that you should report to your doctor or health care professional as soon as possible:  allergic reactions like skin rash, itching or hives, swelling of the face, lips, or tongue  signs of infection - fever or chills, cough, sore throat, pain or difficulty passing  urine  signs of decreased platelets or bleeding - bruising, pinpoint red spots on the skin, black, tarry stools, nosebleeds  signs of decreased red blood cells - unusually weak or tired, fainting spells, lightheadedness  breathing problems  changes in hearing  changes in vision  chest pain  high blood pressure  low blood counts - This drug may decrease the number of white blood cells, red blood cells and platelets. You may be at increased risk for infections and bleeding.  nausea and vomiting  pain, swelling, redness or irritation at the injection site  pain, tingling, numbness in the hands or feet  problems with balance, talking, walking  trouble passing urine or change in the amount of urine Side effects that usually do not require medical attention (report to your doctor or health care professional if they continue or are bothersome):  hair loss  loss of appetite  metallic taste in the mouth or changes in taste This list may not describe all possible side effects. Call your doctor for medical advice about side effects. You may report side effects to FDA at 1-800-FDA-1088. Where should I keep my medicine? This drug is given in a hospital or clinic and will not be stored at home. NOTE: This sheet is a summary. It may not cover all possible information. If you have questions about this medicine, talk to your doctor, pharmacist, or health care provider.  2020 [employer]vier/Gold Standard (2007-09-10 14:38:05)   

## 2019-03-06 ENCOUNTER — Encounter: Payer: Self-pay | Admitting: Hematology and Oncology

## 2019-03-06 NOTE — Assessment & Plan Note (Signed)
She has minimum abdominal pain/discomfort She will take pain medicine as needed I warned her about risk of constipation

## 2019-03-06 NOTE — Assessment & Plan Note (Signed)
She understood goals of care is palliative We discussed what to expect should her cancer continues to progress without treatment She would like to proceed with treatment

## 2019-03-06 NOTE — Progress Notes (Signed)
[place] OFFICE PROGRESS NOTE  Patient Care Team: Marton Redwood, [name]  ASSESSMENT & PLAN:  Left ovarian epithelial cancer [facility]) We reviewed the current guidelines The recommendation is based on publication below:  [place] (2012) 107, [phone] & 2012 [employer] [place] All rights reserved 0007 - 0920/12  Final overall survival results of phase III [facility] CALYPSO trial of pegylated liposomal doxorubicin and carboplatin vs paclitaxel and carboplatin in platinum-sensitive ovarian cancer patients  BACKGROUND: The CALYPSO phase III trial compared CD (carboplatin-pegylated liposomal doxorubicin (PLD)) with CP (carboplatinpaclitaxel) in patients with platinum-sensitive recurrent ovarian cancer (ROC). Overall survival (OS) data are now mature. METHODS: Women with ROC relapsing 46 months after first- or second-line therapy were randomised to CD or CP for six cycles in this international, open-label, non-inferiority trial. The primary endpoint was progression-free survival. The OS analysis is presented here. RESULTS: A total of 976 patients were randomised (467 to CD and 509 to CP). With a median follow-up of 49 months, no statistically significant difference was observed between arms in OS (hazard ratio0.99 (95% confidence interval 0.85, 1.16); log-rank P0.94). Median survival times were 30.7 months (CD) and 33.0 months (CP). No statistically significant difference in OS was observed between arms in predetermined subgroups according to age, body mass index, treatment-free interval, measurable disease, number of lines of prior chemotherapy, or performance status. Post-study cross-over was imbalanced between arms, with a greater proportion of patients randomised to CP receiving post-study PLD (68%) than patients randomised to CD receiving post-study paclitaxel (43%; Po0.001). CONCLUSION: Carboplatin-PLD led to delayed progression and similar  OS compared with carboplatin-paclitaxel in platinum-sensitive ROC.  We discussed the role of chemotherapy. The intent is of palliative intent.  We discussed some of the risks, benefits, side-effects of carboplatin and liposomal doxorubicin  Some of the short term side-effects included, though not limited to, including weight loss, life threatening infections, risk of allergic reactions, need for transfusions of blood products, nausea, vomiting, change in bowel habits, loss of hair, risk of congestive heart failure, admission to hospital for various reasons, and risks of death.   Long term side-effects are also discussed including risks of infertility, permanent damage to nerve function, hearing loss, chronic fatigue, kidney damage with possibility needing hemodialysis, and rare secondary malignancy including bone marrow disorders.  The patient is aware that the response rates discussed earlier is not guaranteed.  After a long discussion, patient made an informed decision to proceed with the prescribed plan of care.  We discussed the use of cardiac imaging before and during treatment and close monitoring for signs of heart failure while on treatment  I do not plan prophylactic G-CSF support I recommend minimum 3 cycles of treatment before repeat imaging study I plan carboplatin at AUC of 5 and mildly reduced upfront dose of Doxil given her age  Abdominal pain She has minimum abdominal pain/discomfort She will take pain medicine as needed I warned her about risk of constipation  Goals of care, counseling/discussion She understood goals of care is palliative We discussed what to expect should her cancer continues to progress without treatment She would like to proceed with treatment   No orders of the defined types were placed in this encounter.   INTERVAL HISTORY: Please see below for problem oriented charting. 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)  01/02/2018 Imaging   1. Large complex left paramidline pelvic mass likely rises from the left ovary. Together with left periaortic adenopathy and peritoneal carcinomatosis, findings are worrisome for metastatic ovarian cancer. 2. Small pelvic free fluid. 3.  Aortic atherosclerosis (ICD10-170.0).   01/21/2018 Pathology Results   Omentum, biopsy, dominant omental nodule within the right lower abdominal quadrant - POORLY DIFFERENTIATED CARCINOMA CONSISTENT WITH GYNECOLOGIC PRIMARY. - SEE MICROSCOPIC DESCRIPTION. Microscopic Comment The tumor is characterized by sheets of tumor cells with enlarged nuclei, many containing prominent nucleoli. There are patchy areas with eosinophilic cytoplasmic inclusions. Immunohistochemistry shows positivity with cytokeratin AE1/AE3, cytokeratin 7, estrogen receptor, progesterone receptor, PAX-8, WT-1, and placental alkaline phosphatase (PLAP). The tumor is negative with alpha fetoprotein, human chorionic gonadotropin, CD117, CD10, CD30, CD56, CDX-2, CEA, cytokeratin 20, S100, SOX-10, synaptophysin, chromogranin, D2-40, GATA-3, GCDFP, Glypican 3 and Inhibin. 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 The estrogen receptor is 100% with strong staining and the progesterone receptor is 90% with strong staining.   01/21/2018 Procedure   Technically successful CT guided core needle biopsy of dominant omental nodule within the right lower abdominal quadrant, adjacent to the cecum.   01/24/2018 Cancer Staging   Staging form: Ovary, Fallopian Tube, and Primary Peritoneal Carcinoma, AJCC 8th Edition - Clinical: Stage IIIC (cT3c, cN1b, cM0) - Signed by [name], MD on 01/24/2018   01/25/2018 Tumor  Marker   Patient's tumor was tested for the following markers: AFP Results of the tumor marker test revealed 4.7   01/31/2018 Surgery   Pre-operative Diagnosis: Ovarian cancer NOS  Post-operative Diagnosis: At least Stage 3 ovarian cancer   Operation: Laparoscopic omental biopsy  Surgeon: Mart Piggs, MD  Specimens: Omentum  Operative Findings: Omental nodule >2cm. Large left pelvic mass 12-15cm with sigmoid draped medially and potentially adherent to colon. The mass appeared to have a 3cm cystic area that was ruptured preoperatively. Uterus appears normal with small anterior fibroid ~1cm. Right adnexa nonenlarged. Small bowel without evidence of disease. No diaphragmatic disease.    01/31/2018 Pathology Results   Omentum, resection for tumor - HIGH GRADE CARCINOMA. - SEE MICROSCOPIC DESCRIPTION. Microscopic Comment This case is sent to Dr. Annitta Jersey for consultation. The case was discussed with Dr. Gerarda Fraction on 02/01/18. (JDP:kh 02/01/18) The omentum shows nodules of high grade carcinoma characterized by diffuse sheets of malignant cells with enlarged nuclei with prominent nucleoli. There is moderate to abundant eosinophilic cytoplasm and frequent mitotic figures. 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 The entire specimen is examined histologically and there are nodules of tumor throughout the specimen with identical morphology. Additional immunohistochemistry for hepatoid markers will be performed and reported as an addendum   02/11/2018 Procedure   Status post right IJ port catheter placement. Catheter ready for use.   02/14/2018 Tumor Marker   Patient's tumor was tested for the following markers:  CA-125 Results of the tumor marker test revealed 1523   02/15/2018 -  Chemotherapy   The patient had carboplatin and taxol. Taxol dose reduced from cycle 2 onwards   02/15/2018 - 07/05/2018 Chemotherapy   The patient had  carboplatin and taxol x 6   02/23/2018 Imaging   Interval development of abdominal ascites.  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  No significant change in multilobulated complex left pelvic mass, which compresses the pelvic structures and displaces them to the right.  Interval development of mild right hydronephrosis and hydroureter, possibly postobstructive.   03/08/2018 Tumor Marker   Patient's tumor was tested for the following markers: CA-125 Results of the tumor marker test revealed 1844   03/29/2018 Tumor Marker   Patient's tumor was tested for the following markers: CA-125 Results of the tumor marker test revealed 1141   04/10/2018 Imaging   1. New omental nodule. Otherwise, left ovarian mass and right paracolic gutter nodule have decreased in size in the interval. 2. Small ascites, increased. 3. Endometrial cavity may be dilated and contains fluid. Please correlate clinically. 4. Mild prominence of the ureters bilaterally, possibly due to large left adnexal mass. 5. Aortic atherosclerosis (ICD10-170.0). Coronary artery calcification.   04/25/2018 Surgery   Procedure(s) Performed:   1. Exploratory laparotomy  2. TAH/Bilateral salpingo-oophorectomy with radical tumor debulking for ovarian cancer . 3. Omentectomy  Surgeon: Bernita Raisin, MD Specimens: Bilateral tubes and ovaries, omentum, sigmoid nodule, cecal nodule   Operative Findings: Large left sided ovarian tumor wedged retroperitoneal and inferiorly into pelvis with some adherence to sigmoid mesentary. Some tumor nodules on surface of sigmoid epiploica that was removed. ~1-2 cm tumor implant lateral to cecum also excised. No obvious omental disease. This represented  an optimal cytoreduction (R0) with no gross visible disease remaining. No carcinomatosis. Diaphragms and small bowel /mesentary all free of disease.    04/25/2018 Pathology Results   1. 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 - PRESUMED OVARIAN SURFACE INVOLVEMENT. - DEFINITIVE FALLOPIAN TUBE NOT IDENTIFIED. - SEE COMMENT AND ONCOLOGY TABLE. 2. Uterus and cervix, right tube and ovary - UTERUS: -ENDOMETRIUM: INACTIVE ENDOMETRIUM. NO HYPERPLASIA OR MALIGNANCY. -MYOMETRIUM: LEIOMYOMA. NO MALIGNANCY. -SEROSA: UNREMARKABLE. NO MALIGNANCY. - CERVIX: BENIGN SQUAMOUS AND ENDOCERVICAL MUCOSA. NO DYSPLASIA OR MALIGNANCY. - RIGHT OVARY: UNREMARKABLE. NO MALIGNANCY. - RIGHT FALLOPIAN TUBES: METASTATIC CARCINOMA. 3. Soft tissue, biopsy, sigmoid implant - METASTATIC CARCINOMA. 4. Omentum, resection for tumor - METASTATIC CARCINOMA. 5. Soft tissue, biopsy, pelvic cecal implant - METASTATIC CARCINOMA. Microscopic Comment 1. OVARY or FALLOPIAN TUBE or PRIMARY PERITONEUM: Procedure: Left salpingo-oophorectomy. Hysterectomy and right salpingo-oophorectomy, omentectomy and implant biopsies. Specimen Integrity: Disrupted. Tumor Site: Left ovary. Ovarian Surface Involvement (required only if applicable): Presumed surface involvement, see comment. Fallopian Tube Surface Involvement (required only if applicable): Can not be determined, see comment. Tumor Size: 13.3 cm. Histologic Type: See comment. Histologic Grade: High grade. Implants (required for advanced stage serous/seromucinous borderline tumors only): Present. Other Tissue/ Organ Involvement: Right fallopian tube, sigmoid implant, omental implant, cecal implant. Largest Extrapelvic Peritoneal Focus (required only if applicable): 1.6 cm. Peritoneal/Ascitic Fluid: N/A. Treatment Effect (required only for high-grade serous carcinomas): Definitive effect not seen. Regional Lymph Nodes: No lymph nodes  submitted or found. Pathologic Stage Classification (pTNM, AJCC 8th Edition): ypT3b, ypNX Representative Tumor Block: 1A-F Comment(s): Residual ovarian parenchyma is not identified and the entire mass consists of tumor, thus, surface involvement is presumably present. The left fallopian tube is not grossly or microscopically identified and thus involvement can not be assessed. A prior omental biopsy was sent to Dr. Annitta Jersey for consultation and was diagnosed as high grade carcinoma with hepatoid features. The current tumor is compared to the biopsy and  looks morphologically identical.   05/06/2018 Tumor Marker   Patient's tumor was tested for the following markers: CA-125 Results of the tumor marker test revealed 190   06/10/2018 Tumor Marker   Patient's tumor was tested for the following markers: CA-125 Results of the tumor marker test revealed 16.4   07/05/2018 Tumor Marker   Patient's tumor was tested for the following markers: CA-125 Results of the tumor marker test revealed 13.4   08/01/2018 Imaging   1. No evidence of metastatic disease in the chest, abdomen or pelvis. 2. Interval resection of the large left adnexal mass. Haziness of the lower mesenteric and distal pericolonic fat, favor postsurgical change. This study will serve as a new baseline for future surveillance. 3. Chronic findings include: Aortic Atherosclerosis (ICD10-I70.0). Small hiatal hernia. Moderate sigmoid diverticulosis.   08/01/2018 Tumor Marker   Patient's tumor was tested for the following markers: CA-125 Results of the tumor marker test revealed 12.1   08/26/2018 Procedure   Successful right IJ vein Port-A-Cath explant.   02/10/2019 Tumor Marker   Patient's tumor was tested for the following markers: CA-125 Results of the tumor marker test revealed 49.1   02/18/2019 Imaging   1. Interval development of mildly enlarged left para-aortic lymph node measuring 1.0 cm in short axis. This is nonspecific, but the  possibility of nodal metastasis warrants consideration. Close attention on follow-up studies is recommended. 2. No other signs of potential metastatic disease noted elsewhere in the abdomen or pelvis. 3. However, there is a focal area of asymmetric mural thickening in the mid to distal sigmoid colon measuring 2.3 x 2.2 cm. The possibility of colonic neoplasm should be considered, and further evaluation with nonemergent outpatient colonoscopy is suggested in the near future to better evaluate this finding. 4. Several small infraumbilical ventral hernias containing short segments of mid to distal small bowel, without evidence of associated bowel incarceration or obstruction at this time. 5. There are calcifications of the aortic valve. Echocardiographic correlation for evaluation of potential valvular dysfunction may be warranted if clinically indicated. 6. Aortic atherosclerosis. 7. Additional incidental findings, as above.   02/28/2019 Procedure   Successful placement of a right IJ approach Power Port with ultrasound and fluoroscopic guidance. The catheter is ready for use.     03/05/2019 -  Chemotherapy   The patient had palonosetron (ALOXI) injection 0.25 mg, 0.25 mg, Intravenous,  Once, 1 of 6 cycles Administration: 0.25 mg (03/05/2019) CARBOplatin (PARAPLATIN) 390 mg in sodium chloride 0.9 % 250 mL chemo infusion, 390 mg (100 % of original dose 387.5 mg), Intravenous,  Once, 1 of 6 cycles Dose modification:   (original dose 387.5 mg, Cycle 1) Administration: 390 mg (03/05/2019) DOXOrubicin HCL LIPOSOMAL (DOXIL) 60 mg in dextrose 5 % 250 mL chemo infusion, 31 mg/m2 = 58 mg, Intravenous,  Once, 1 of 6 cycles Administration: 60 mg (03/05/2019) fosaprepitant (EMEND) 150 mg, dexamethasone (DECADRON) 12 mg in sodium chloride 0.9 % 145 mL IVPB, , Intravenous,  Once, 1 of 6 cycles Administration:  (03/05/2019)  for chemotherapy treatment.      REVIEW OF SYSTEMS:   Constitutional: Denies fevers,  chills or abnormal weight loss Eyes: Denies blurriness of vision Ears, nose, mouth, throat, and face: Denies mucositis or sore throat Respiratory: Denies cough, dyspnea or wheezes Cardiovascular: Denies palpitation, chest discomfort or lower extremity swelling Gastrointestinal:  Denies nausea, heartburn or change in bowel habits Skin: Denies abnormal skin rashes Lymphatics: Denies new lymphadenopathy or easy bruising Neurological:Denies numbness, tingling or new weaknesses Behavioral/Psych:  Mood is stable, no new changes  All other systems were reviewed with the patient and are negative.  I have reviewed the past medical history, past surgical history, social history and family history with the patient and they are unchanged from previous note.  ALLERGIES:  has No Known Allergies.  MEDICATIONS:  Current Outpatient Medications  Medication Sig Dispense Refill  . acetaminophen (TYLENOL) 500 MG tablet Take 500 mg by mouth every 8 (eight) hours as needed for mild pain or headache.     Marland Kitchen amLODipine (NORVASC) 5 MG tablet Take 5 mg by mouth at bedtime.     . fluticasone (FLONASE) 50 MCG/ACT nasal spray Place 2 sprays into both nostrils 2 (two) times daily. (Patient taking differently: Place 2 sprays into both nostrils as needed. ) 16 g 2  . levothyroxine (SYNTHROID, LEVOTHROID) 88 MCG tablet Take 88 mcg by mouth daily before breakfast.    . lidocaine-prilocaine (EMLA) cream Apply to affected area once 30 g 3  . loratadine (CLARITIN) 10 MG tablet Take 10 mg by mouth daily as needed for allergies.     . metoprolol succinate (TOPROL-XL) 50 MG 24 hr tablet Take 50 mg by mouth every morning.     . Multiple Vitamins-Minerals (MULTIVITAMINS THER. W/MINERALS) TABS Take 1 tablet by mouth daily.      . naphazoline-pheniramine (NAPHCON-A) 0.025-0.3 % ophthalmic solution Place 1 drop into both eyes daily as needed for eye irritation.    Marland Kitchen olmesartan (BENICAR) 40 MG tablet Take 40 mg by mouth at bedtime.      . ondansetron (ZOFRAN) 8 MG tablet Take 1 tablet (8 mg total) by mouth every 8 (eight) hours as needed. 30 tablet 1  . ondansetron (ZOFRAN-ODT) 8 MG disintegrating tablet Take 1 tablet (8 mg total) by mouth every 8 (eight) hours as needed for nausea or vomiting. 20 tablet 0  . prochlorperazine (COMPAZINE) 10 MG tablet Take 1 tablet (10 mg total) by mouth every 6 (six) hours as needed (Nausea or vomiting). 30 tablet 1   No current facility-administered medications for this visit.     PHYSICAL EXAMINATION: ECOG PERFORMANCE STATUS: 1 - Symptomatic but completely ambulatory  Vitals:   03/05/19 1105  BP: (!) 141/100  Pulse: 80  Resp: 18  Temp: 98 F (36.7 C)  SpO2: 98%   Filed Weights   03/05/19 1105  Weight: 178 lb 9.6 oz (81 kg)    GENERAL:alert, no distress and comfortable SKIN: skin color, texture, turgor are normal, no rashes or significant lesions EYES: normal, Conjunctiva are pink and non-injected, sclera clear OROPHARYNX:no exudate, no erythema and lips, buccal mucosa, and tongue normal  NECK: supple, thyroid normal size, non-tender, without nodularity LYMPH:  no palpable lymphadenopathy in the cervical, axillary or inguinal LUNGS: clear to auscultation and percussion with normal breathing effort HEART: regular rate & rhythm and no murmurs and no lower extremity edema ABDOMEN:abdomen soft, non-tender and normal bowel sounds Musculoskeletal:no cyanosis of digits and no clubbing  NEURO: alert & oriented x 3 with fluent speech, no focal motor/sensory deficits  LABORATORY DATA:  I have reviewed the data as listed    Component Value Date/Time   NA 138 02/28/2019 0910   K 4.2 02/28/2019 0910   CL 107 02/28/2019 0910   CO2 24 02/28/2019 0910   GLUCOSE 127 (H) 02/28/2019 0910   BUN 15 02/28/2019 0910   CREATININE 0.64 02/28/2019 0910   CREATININE 0.72 07/15/2018 1010   CALCIUM 9.2 02/28/2019 0910   PROT 7.7 02/28/2019  0910   ALBUMIN 4.3 02/28/2019 0910   AST 27  02/28/2019 0910   AST 21 07/15/2018 1010   ALT 19 02/28/2019 0910   ALT 22 07/15/2018 1010   ALKPHOS 82 02/28/2019 0910   BILITOT 0.8 02/28/2019 0910   BILITOT 0.3 07/15/2018 1010   GFRNONAA >60 02/28/2019 0910   GFRNONAA >60 07/15/2018 1010   GFRAA >60 02/28/2019 0910   GFRAA >60 07/15/2018 1010    No results found for: SPEP, UPEP  Lab Results  Component Value Date   WBC 7.3 02/28/2019   NEUTROABS 4.2 02/28/2019   HGB 12.8 02/28/2019   HCT 39.9 02/28/2019   MCV 90.7 02/28/2019   PLT 300 02/28/2019      Chemistry      Component Value Date/Time   NA 138 02/28/2019 0910   K 4.2 02/28/2019 0910   CL 107 02/28/2019 0910   CO2 24 02/28/2019 0910   BUN 15 02/28/2019 0910   CREATININE 0.64 02/28/2019 0910   CREATININE 0.72 07/15/2018 1010      Component Value Date/Time   CALCIUM 9.2 02/28/2019 0910   ALKPHOS 82 02/28/2019 0910   AST 27 02/28/2019 0910   AST 21 07/15/2018 1010   ALT 19 02/28/2019 0910   ALT 22 07/15/2018 1010   BILITOT 0.8 02/28/2019 0910   BILITOT 0.3 07/15/2018 1010       RADIOGRAPHIC STUDIES: I have personally reviewed the radiological images as listed and agreed with the findings in the report. Ct Abdomen Pelvis W Contrast  Result Date: 02/19/2019 CLINICAL DATA:  85 year old female with history of recurrent ovarian neoplasm. Chemotherapy complete. Follow-up study. EXAM: CT ABDOMEN AND PELVIS WITH CONTRAST TECHNIQUE: Multidetector CT imaging of the abdomen and pelvis was performed using the standard protocol following bolus administration of intravenous contrast. CONTRAST:  114m OMNIPAQUE IOHEXOL 300 MG/ML  SOLN COMPARISON:  CT the abdomen and pelvis 08/01/2018. FINDINGS: Lower chest: Severe calcifications of the aortic valve. Small hiatal hernia. Hepatobiliary: No suspicious cystic or solid hepatic lesions. No intra or extrahepatic biliary ductal dilatation. Gallbladder is normal in appearance. Pancreas: No pancreatic mass. No pancreatic ductal  dilatation. No pancreatic or peripancreatic fluid collections or inflammatory changes. Spleen: Unremarkable. Adrenals/Urinary Tract: Multiple subcentimeter low-attenuation lesions in both kidneys, too small to characterize, but similar to the prior study and statistically likely to represent tiny cysts. No definite aggressive appearing renal lesions. Bilateral adrenal glands are normal in appearance. No hydroureteronephrosis. Urinary bladder is partially obscured by beam hardening artifact from the patient's right hip arthroplasty, but is otherwise unremarkable in appearance. Stomach/Bowel: Normal appearance of the stomach. No pathologic dilatation of small bowel or colon. Focal area of asymmetric mural thickening in the mid to distal sigmoid colon (axial image 65 of series 2) measuring 2.3 x 2.2 cm. The appendix is not confidently identified and may be surgically absent. Regardless, there are no inflammatory changes noted adjacent to the cecum to suggest the presence of an acute appendicitis at this time. Vascular/Lymphatic: Aortic atherosclerosis, without evidence of aneurysm or dissection in the abdominal or pelvic vasculature. New mildly enlarged left para-aortic lymph node (axial image 31 of series 2). No other pathologically enlarged abdominal or pelvic lymph nodes are noted. Reproductive: Status post total abdominal hysterectomy and bilateral salpingo-oophorectomy. Other: Several small infraumbilical ventral hernias containing short segments of mid to distal small bowel. No significant volume of ascites. No pneumoperitoneum. Musculoskeletal: Status post right hip arthroplasty. There are no aggressive appearing lytic or blastic lesions noted in the visualized  portions of the skeleton. IMPRESSION: 1. Interval development of mildly enlarged left para-aortic lymph node measuring 1.0 cm in short axis. This is nonspecific, but the possibility of nodal metastasis warrants consideration. Close attention on follow-up  studies is recommended. 2. No other signs of potential metastatic disease noted elsewhere in the abdomen or pelvis. 3. However, there is a focal area of asymmetric mural thickening in the mid to distal sigmoid colon measuring 2.3 x 2.2 cm. The possibility of colonic neoplasm should be considered, and further evaluation with nonemergent outpatient colonoscopy is suggested in the near future to better evaluate this finding. 4. Several small infraumbilical ventral hernias containing short segments of mid to distal small bowel, without evidence of associated bowel incarceration or obstruction at this time. 5. There are calcifications of the aortic valve. Echocardiographic correlation for evaluation of potential valvular dysfunction may be warranted if clinically indicated. 6. Aortic atherosclerosis. 7. Additional incidental findings, as above. Electronically Signed   By: Vinnie Langton M.D.   On: 02/19/2019 08:49   Ir Imaging Guided Port Insertion  Result Date: 02/28/2019 INDICATION: 85 year old female with recurrent ovarian cancer. She presents for port catheter placement. EXAM: IMPLANTED PORT A CATH PLACEMENT WITH ULTRASOUND AND FLUOROSCOPIC GUIDANCE MEDICATIONS: 2 g Ancef; The antibiotic was administered within an appropriate time interval prior to skin puncture. ANESTHESIA/SEDATION: Versed 2 mg IV; Fentanyl 50 mcg IV; Moderate Sedation Time:  22 minutes The patient was continuously monitored during the procedure by the interventional radiology nurse under my direct supervision. FLUOROSCOPY TIME:  0 minutes, 12 seconds (2 mGy) COMPLICATIONS: None immediate. PROCEDURE: The right neck and chest was prepped with chlorhexidine, and draped in the usual sterile fashion using maximum barrier technique (cap and mask, sterile gown, sterile gloves, large sterile sheet, hand hygiene and cutaneous antiseptic). Local anesthesia was attained by infiltration with 1% lidocaine with epinephrine. Ultrasound demonstrated patency  of the right internal jugular vein, and this was documented with an image. Under real-time ultrasound guidance, this vein was accessed with a 21 gauge micropuncture needle and image documentation was performed. A small dermatotomy was made at the access site with an 11 scalpel. A 0.018" wire was advanced into the SVC and the access needle exchanged for a 23F micropuncture vascular sheath. The 0.018" wire was then removed and a 0.035" wire advanced into the IVC. An appropriate location for the subcutaneous reservoir was selected below the clavicle and an incision was made through the skin and underlying soft tissues. The subcutaneous tissues were then dissected using a combination of blunt and sharp surgical technique and a pocket was formed. A single lumen power injectable portacatheter was then tunneled through the subcutaneous tissues from the pocket to the dermatotomy and the port reservoir placed within the subcutaneous pocket. The venous access site was then serially dilated and a peel away vascular sheath placed over the wire. The wire was removed and the port catheter advanced into position under fluoroscopic guidance. The catheter tip is positioned in the superior cavoatrial junction. This was documented with a spot image. The portacatheter was then tested and found to flush and aspirate well. The port was flushed with saline followed by 100 units/mL heparinized saline. The pocket was then closed in two layers using first subdermal inverted interrupted absorbable sutures followed by a running subcuticular suture. The epidermis was then sealed with Dermabond. The dermatotomy at the venous access site was also closed with Dermabond. IMPRESSION: Successful placement of a right IJ approach Power Port with ultrasound and fluoroscopic guidance.  The catheter is ready for use. Electronically Signed   By: Jacqulynn Cadet M.D.   On: 02/28/2019 12:18    All questions were answered. The patient knows to call the  clinic with any problems, questions or concerns. No barriers to learning was detected.  I spent 25 minutes counseling the patient face to face. The total time spent in the appointment was 30 minutes and more than 50% was on counseling and review of test results  [name], MD 03/06/2019 9:37 AM

## 2019-03-06 NOTE — Assessment & Plan Note (Addendum)
We reviewed the current guidelines The recommendation is based on publication below:  [place] (2012) [phone], [phone] & 2012 [employer] [place] All rights reserved 0007 - 0920/12  Final overall survival results of phase III [facility] CALYPSO trial of pegylated liposomal doxorubicin and carboplatin vs paclitaxel and carboplatin in platinum-sensitive ovarian cancer patients  BACKGROUND: The CALYPSO phase III trial compared CD (carboplatin-pegylated liposomal doxorubicin (PLD)) with CP (carboplatinpaclitaxel) in patients with platinum-sensitive recurrent ovarian cancer (ROC). Overall survival (OS) data are now mature. METHODS: Women with ROC relapsing 46 months after first- or second-line therapy were randomised to CD or CP for six cycles in this international, open-label, non-inferiority trial. The primary endpoint was progression-free survival. The OS analysis is presented here. RESULTS: A total of 976 patients were randomised (467 to CD and 509 to CP). With a median follow-up of 49 months, no statistically significant difference was observed between arms in OS (hazard ratio0.99 (95% confidence interval 0.85, 1.16); log-rank P0.94). Median survival times were 30.7 months (CD) and 33.0 months (CP). No statistically significant difference in OS was observed between arms in predetermined subgroups according to age, body mass index, treatment-free interval, measurable disease, number of lines of prior chemotherapy, or performance status. Post-study cross-over was imbalanced between arms, with a greater proportion of patients randomised to CP receiving post-study PLD (68%) than patients randomised to CD receiving post-study paclitaxel (43%; Po0.001). CONCLUSION: Carboplatin-PLD led to delayed progression and similar OS compared with carboplatin-paclitaxel in platinum-sensitive ROC.  We discussed the role of chemotherapy. The intent is of palliative intent.  We discussed some of the risks,  benefits, side-effects of carboplatin and liposomal doxorubicin  Some of the short term side-effects included, though not limited to, including weight loss, life threatening infections, risk of allergic reactions, need for transfusions of blood products, nausea, vomiting, change in bowel habits, loss of hair, risk of congestive heart failure, admission to hospital for various reasons, and risks of death.   Long term side-effects are also discussed including risks of infertility, permanent damage to nerve function, hearing loss, chronic fatigue, kidney damage with possibility needing hemodialysis, and rare secondary malignancy including bone marrow disorders.  The patient is aware that the response rates discussed earlier is not guaranteed.  After a long discussion, patient made an informed decision to proceed with the prescribed plan of care.  We discussed the use of cardiac imaging before and during treatment and close monitoring for signs of heart failure while on treatment  I do not plan prophylactic G-CSF support I recommend minimum 3 cycles of treatment before repeat imaging study I plan carboplatin at AUC of 5 and mildly reduced upfront dose of Doxil given her age

## 2019-03-07 ENCOUNTER — Telehealth: Payer: Self-pay | Admitting: Hematology and Oncology

## 2019-03-07 NOTE — Telephone Encounter (Signed)
I left a message regarding schedule on daughter phone °

## 2019-03-31 ENCOUNTER — Telehealth: Payer: Self-pay | Admitting: Oncology

## 2019-03-31 NOTE — Telephone Encounter (Signed)
Called Kelly back with message from Dr. Alvy Bimler.  She verbalized understanding and agreement.

## 2019-03-31 NOTE — Telephone Encounter (Signed)
Scottie's daughter, [name], called and asked if a family member can attend the appointment with Dr. Alvy Bimler on 04/02/19.  She said [place]ae[place] is very hard of hearing.

## 2019-03-31 NOTE — Telephone Encounter (Signed)
I am trying to limit visitor pass to major visits such as scan reviews, etc. I can call family with updates

## 2019-04-02 ENCOUNTER — Inpatient Hospital Stay: Payer: Medicare Other

## 2019-04-02 ENCOUNTER — Other Ambulatory Visit: Payer: Self-pay

## 2019-04-02 ENCOUNTER — Encounter: Payer: Self-pay | Admitting: Hematology and Oncology

## 2019-04-02 ENCOUNTER — Inpatient Hospital Stay (HOSPITAL_BASED_OUTPATIENT_CLINIC_OR_DEPARTMENT_OTHER): Payer: Medicare Other | Admitting: Hematology and Oncology

## 2019-04-02 ENCOUNTER — Inpatient Hospital Stay: Payer: Medicare Other | Attending: Hematology and Oncology

## 2019-04-02 DIAGNOSIS — G620 Drug-induced polyneuropathy: Secondary | ICD-10-CM

## 2019-04-02 DIAGNOSIS — Z79899 Other long term (current) drug therapy: Secondary | ICD-10-CM | POA: Diagnosis not present

## 2019-04-02 DIAGNOSIS — R1011 Right upper quadrant pain: Secondary | ICD-10-CM

## 2019-04-02 DIAGNOSIS — C786 Secondary malignant neoplasm of retroperitoneum and peritoneum: Secondary | ICD-10-CM | POA: Insufficient documentation

## 2019-04-02 DIAGNOSIS — C562 Malignant neoplasm of left ovary: Secondary | ICD-10-CM | POA: Diagnosis not present

## 2019-04-02 DIAGNOSIS — Z5111 Encounter for antineoplastic chemotherapy: Secondary | ICD-10-CM | POA: Insufficient documentation

## 2019-04-02 DIAGNOSIS — R109 Unspecified abdominal pain: Secondary | ICD-10-CM | POA: Insufficient documentation

## 2019-04-02 DIAGNOSIS — K5909 Other constipation: Secondary | ICD-10-CM

## 2019-04-02 DIAGNOSIS — T451X5A Adverse effect of antineoplastic and immunosuppressive drugs, initial encounter: Secondary | ICD-10-CM | POA: Insufficient documentation

## 2019-04-02 DIAGNOSIS — Z7189 Other specified counseling: Secondary | ICD-10-CM

## 2019-04-02 LAB — CBC WITH DIFFERENTIAL (CANCER CENTER ONLY)
Abs Immature Granulocytes: 0.07 10*3/uL (ref 0.00–0.07)
Basophils Absolute: 0 10*3/uL (ref 0.0–0.1)
Basophils Relative: 1 %
Eosinophils Absolute: 0.1 10*3/uL (ref 0.0–0.5)
Eosinophils Relative: 1 %
HCT: 35 % — ABNORMAL LOW (ref 36.0–46.0)
Hemoglobin: 11.5 g/dL — ABNORMAL LOW (ref 12.0–15.0)
Immature Granulocytes: 1 %
Lymphocytes Relative: 33 %
Lymphs Abs: 1.6 10*3/uL (ref 0.7–4.0)
MCH: 29.5 pg (ref 26.0–34.0)
MCHC: 32.9 g/dL (ref 30.0–36.0)
MCV: 89.7 fL (ref 80.0–100.0)
Monocytes Absolute: 0.8 10*3/uL (ref 0.1–1.0)
Monocytes Relative: 15 %
Neutro Abs: 2.4 10*3/uL (ref 1.7–7.7)
Neutrophils Relative %: 49 %
Platelet Count: 242 10*3/uL (ref 150–400)
RBC: 3.9 MIL/uL (ref 3.87–5.11)
RDW: 15.7 % — ABNORMAL HIGH (ref 11.5–15.5)
WBC Count: 4.9 10*3/uL (ref 4.0–10.5)
nRBC: 0 % (ref 0.0–0.2)

## 2019-04-02 LAB — CMP (CANCER CENTER ONLY)
ALT: 14 U/L (ref 0–44)
AST: 18 U/L (ref 15–41)
Albumin: 3.7 g/dL (ref 3.5–5.0)
Alkaline Phosphatase: 82 U/L (ref 38–126)
Anion gap: 6 (ref 5–15)
BUN: 14 mg/dL (ref 8–23)
CO2: 25 mmol/L (ref 22–32)
Calcium: 8.8 mg/dL — ABNORMAL LOW (ref 8.9–10.3)
Chloride: 108 mmol/L (ref 98–111)
Creatinine: 0.92 mg/dL (ref 0.44–1.00)
GFR, Est AFR Am: >60 mL/min (ref >60)
GFR, Estimated: 57 mL/min — ABNORMAL LOW (ref >60)
Glucose, Bld: 140 mg/dL — ABNORMAL HIGH (ref 70–99)
Potassium: 3.7 mmol/L (ref 3.5–5.1)
Sodium: 139 mmol/L (ref 135–145)
Total Bilirubin: 0.4 mg/dL (ref 0.3–1.2)
Total Protein: 6.9 g/dL (ref 6.5–8.1)

## 2019-04-02 MED ORDER — DIPHENHYDRAMINE HCL 50 MG/ML IJ SOLN
25.0000 mg | Freq: Once | INTRAMUSCULAR | Status: AC
  Administered 2019-04-02: 25 mg via INTRAVENOUS

## 2019-04-02 MED ORDER — PALONOSETRON HCL INJECTION 0.25 MG/5ML
INTRAVENOUS | Status: AC
  Filled 2019-04-02: qty 5

## 2019-04-02 MED ORDER — DIPHENHYDRAMINE HCL 50 MG/ML IJ SOLN
INTRAMUSCULAR | Status: AC
  Filled 2019-04-02: qty 1

## 2019-04-02 MED ORDER — SODIUM CHLORIDE 0.9% FLUSH
10.0000 mL | Freq: Once | INTRAVENOUS | Status: AC
  Administered 2019-04-02: 10 mL
  Filled 2019-04-02: qty 10

## 2019-04-02 MED ORDER — SODIUM CHLORIDE 0.9 % IV SOLN
Freq: Once | INTRAVENOUS | Status: AC
  Administered 2019-04-02: 12:00:00 via INTRAVENOUS
  Filled 2019-04-02: qty 5

## 2019-04-02 MED ORDER — DEXTROSE 5 % IV SOLN
Freq: Once | INTRAVENOUS | Status: AC
  Administered 2019-04-02: 11:00:00 via INTRAVENOUS
  Filled 2019-04-02: qty 250

## 2019-04-02 MED ORDER — PALONOSETRON HCL INJECTION 0.25 MG/5ML
0.2500 mg | Freq: Once | INTRAVENOUS | Status: AC
  Administered 2019-04-02: 0.25 mg via INTRAVENOUS

## 2019-04-02 MED ORDER — FAMOTIDINE IN NACL 20-0.9 MG/50ML-% IV SOLN
20.0000 mg | Freq: Once | INTRAVENOUS | Status: AC
  Administered 2019-04-02: 20 mg via INTRAVENOUS

## 2019-04-02 MED ORDER — DOXORUBICIN HCL LIPOSOMAL CHEMO INJECTION 2 MG/ML
31.0000 mg/m2 | Freq: Once | INTRAVENOUS | Status: AC
  Administered 2019-04-02: 60 mg via INTRAVENOUS
  Filled 2019-04-02: qty 30

## 2019-04-02 MED ORDER — SODIUM CHLORIDE 0.9% FLUSH
10.0000 mL | INTRAVENOUS | Status: DC | PRN
  Administered 2019-04-02: 10 mL
  Filled 2019-04-02: qty 10

## 2019-04-02 MED ORDER — HEPARIN SOD (PORK) LOCK FLUSH 100 UNIT/ML IV SOLN
500.0000 [IU] | Freq: Once | INTRAVENOUS | Status: AC | PRN
  Administered 2019-04-02: 500 [IU]
  Filled 2019-04-02: qty 5

## 2019-04-02 MED ORDER — SODIUM CHLORIDE 0.9 % IV SOLN
387.5000 mg | Freq: Once | INTRAVENOUS | Status: AC
  Administered 2019-04-02: 390 mg via INTRAVENOUS
  Filled 2019-04-02: qty 39

## 2019-04-02 MED ORDER — FAMOTIDINE IN NACL 20-0.9 MG/50ML-% IV SOLN
INTRAVENOUS | Status: AC
  Filled 2019-04-02: qty 50

## 2019-04-02 MED ORDER — SODIUM CHLORIDE 0.9 % IV SOLN: 387.5000 mg | Freq: Once | INTRAVENOUS | Status: DC

## 2019-04-02 NOTE — Patient Instructions (Signed)
[facility] Discharge Instructions for Patients Receiving Chemotherapy  Today you received the following chemotherapy agents: Doxil, Carboplatin  To help prevent nausea and vomiting after your treatment, we encourage you to take your nausea medication as directed.  If you develop nausea and vomiting that is not controlled by your nausea medication, call the clinic.   BELOW ARE SYMPTOMS THAT SHOULD BE REPORTED IMMEDIATELY:  *FEVER GREATER THAN 100.5 F  *CHILLS WITH OR WITHOUT FEVER  NAUSEA AND VOMITING THAT IS NOT CONTROLLED WITH YOUR NAUSEA MEDICATION  *UNUSUAL SHORTNESS OF BREATH  *UNUSUAL BRUISING OR BLEEDING  TENDERNESS IN MOUTH AND THROAT WITH OR WITHOUT PRESENCE OF ULCERS  *URINARY PROBLEMS  *BOWEL PROBLEMS  UNUSUAL RASH Items with * indicate a potential emergency and should be followed up as soon as possible.  Feel free to call the clinic should you have any questions or concerns. The clinic phone number is [phone].  Please show the [facility] at check-in to the Emergency Department and triage nurse.

## 2019-04-02 NOTE — Assessment & Plan Note (Signed)
She has mild intermittent abdominal discomfort that comes and goes but does not bother her She does not need analgesics for this

## 2019-04-02 NOTE — Progress Notes (Signed)
04/02/19  This is carboplatin dose # 7 will need additional pre-medications for famotidine 20 mg IVPB x 1 and diphenhydramine 25 mg IV (adjusted for age) x 1.  Henreitta Leber, PharmD

## 2019-04-02 NOTE — Assessment & Plan Note (Signed)
Overall, she tolerated cycle 1 of chemotherapy well without major side effects She have minimal hand-foot syndrome Her blood counts are satisfactory We will proceed with cycle 2 without delay I recommend minimum 3 cycles of treatment before repeat imaging study

## 2019-04-02 NOTE — Assessment & Plan Note (Signed)
She denies exacerbation of neuropathy Observe only for now

## 2019-04-02 NOTE — Assessment & Plan Note (Signed)
She continues to have mild constipation Recommend aggressive laxative therapy

## 2019-04-02 NOTE — Progress Notes (Signed)
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[place]) Overall, she tolerated cycle 1 of chemotherapy well without major side effects She have minimal hand-foot syndrome Her blood counts are satisfactory We will proceed with cycle 2 without delay I recommend minimum 3 cycles of treatment before repeat imaging study  Abdominal pain She has mild intermittent abdominal discomfort that comes and goes but does not bother her She does not need analgesics for this  Peripheral neuropathy due to chemotherapy [facility]) She denies exacerbation of neuropathy Observe only for now  Other constipation She continues to have mild constipation Recommend aggressive laxative therapy   No orders of the defined types were placed in this encounter.   INTERVAL HISTORY: Please see below for problem oriented charting. 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 Large complex left paramidline pelvic mass likely rises from the left ovary. Together with left periaortic adenopathy and peritoneal carcinomatosis, findings are worrisome for metastatic ovarian cancer. 2. Small pelvic free fluid. 3.  Aortic atherosclerosis (ICD10-170.0).   01/21/2018 Pathology Results   Omentum, biopsy, dominant omental nodule within the right lower abdominal quadrant - POORLY DIFFERENTIATED CARCINOMA CONSISTENT WITH GYNECOLOGIC PRIMARY. - SEE MICROSCOPIC  DESCRIPTION. Microscopic Comment The tumor is characterized by sheets of tumor cells with enlarged nuclei, many containing prominent nucleoli. There are patchy areas with eosinophilic cytoplasmic inclusions. Immunohistochemistry shows positivity with cytokeratin AE1/AE3, cytokeratin 7, estrogen receptor, progesterone receptor, PAX-8, WT-1, and placental alkaline phosphatase (PLAP). The tumor is negative with alpha fetoprotein, human chorionic gonadotropin, CD117, CD10, CD30, CD56, CDX-2, CEA, cytokeratin 20, S100, SOX-10, synaptophysin, chromogranin, D2-40, GATA-3, GCDFP, Glypican 3 and Inhibin. 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 The estrogen receptor is 100% with strong staining and the progesterone receptor is 90% with strong staining.   01/21/2018 Procedure   Technically successful CT guided core needle biopsy of dominant omental nodule within the right lower abdominal quadrant, adjacent to the cecum.   01/24/2018 Cancer Staging   Staging form: Ovary, Fallopian Tube, and Primary Peritoneal Carcinoma, AJCC 8th Edition - Clinical: Stage IIIC (cT3c, cN1b, cM0) - Signed by [name], MD on 01/24/2018   01/25/2018 Tumor Marker   Patient's tumor was tested for the following markers: AFP Results of the tumor marker test revealed 4.7   01/31/2018 Surgery   Pre-operative Diagnosis: Ovarian cancer NOS  Post-operative Diagnosis: At least Stage 3 ovarian cancer   Operation: Laparoscopic omental biopsy  Surgeon: Mart Piggs, MD  Specimens: Omentum  Operative Findings: Omental nodule >2cm. Large left pelvic mass 12-15cm with sigmoid draped medially and potentially adherent to colon. The mass appeared to have a 3cm cystic area that was ruptured preoperatively. Uterus appears normal with small anterior fibroid ~1cm. Right adnexa nonenlarged. Small bowel without evidence of disease. No diaphragmatic disease.    01/31/2018 Pathology  Results   Omentum, resection for tumor - HIGH GRADE CARCINOMA. - SEE MICROSCOPIC  DESCRIPTION. Microscopic Comment This case is sent to Dr. Annitta Jersey for consultation. The case was discussed with Dr. Gerarda Fraction on 02/01/18. (JDP:kh 02/01/18) The omentum shows nodules of high grade carcinoma characterized by diffuse sheets of malignant cells with enlarged nuclei with prominent nucleoli. There is moderate to abundant eosinophilic cytoplasm and frequent mitotic figures. 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 The entire specimen is examined histologically and there are nodules of tumor throughout the specimen with identical morphology. Additional immunohistochemistry for hepatoid markers will be performed and reported as an addendum   02/11/2018 Procedure   Status post right IJ port catheter placement. Catheter ready for use.   02/14/2018 Tumor Marker   Patient's tumor was tested for the following markers: CA-125 Results of the tumor marker test revealed 1523   02/15/2018 -  Chemotherapy   The patient had carboplatin and taxol. Taxol dose reduced from cycle 2 onwards   02/15/2018 - 07/05/2018 Chemotherapy   The patient had carboplatin and taxol x 6   02/23/2018 Imaging   Interval development of abdominal ascites.  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  No significant change in multilobulated complex left pelvic mass, which compresses the pelvic structures and displaces them to the right.  Interval development of mild right hydronephrosis and hydroureter, possibly postobstructive.   03/08/2018 Tumor Marker   Patient's tumor was tested for the following markers: CA-125 Results of the tumor marker  test revealed 1844   03/29/2018 Tumor Marker   Patient's tumor was tested for the following markers: CA-125 Results of the tumor marker test revealed 1141   04/10/2018 Imaging   1. New omental nodule. Otherwise, left ovarian mass and right paracolic gutter nodule have decreased in size in the interval. 2. Small ascites, increased. 3. Endometrial cavity may be dilated and contains fluid. Please correlate clinically. 4. Mild prominence of the ureters bilaterally, possibly due to large left adnexal mass. 5. Aortic atherosclerosis (ICD10-170.0). Coronary artery calcification.   04/25/2018 Surgery   Procedure(s) Performed:   1. Exploratory laparotomy  2. TAH/Bilateral salpingo-oophorectomy with radical tumor debulking for ovarian cancer . 3. Omentectomy  Surgeon: Bernita Raisin, MD Specimens: Bilateral tubes and ovaries, omentum, sigmoid nodule, cecal nodule   Operative Findings: Large left sided ovarian tumor wedged retroperitoneal and inferiorly into pelvis with some adherence to sigmoid mesentary. Some tumor nodules on surface of sigmoid epiploica that was removed. ~1-2 cm tumor implant lateral to cecum also excised. No obvious omental disease. This represented an optimal cytoreduction (R0) with no gross visible disease remaining. No carcinomatosis. Diaphragms and small bowel /mesentary all free of disease.    04/25/2018 Pathology Results   1. 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 - PRESUMED OVARIAN SURFACE INVOLVEMENT. - DEFINITIVE FALLOPIAN TUBE NOT IDENTIFIED. - SEE COMMENT AND ONCOLOGY TABLE. 2. Uterus and cervix, right tube and ovary - UTERUS: -ENDOMETRIUM: INACTIVE ENDOMETRIUM. NO HYPERPLASIA OR MALIGNANCY. -MYOMETRIUM: LEIOMYOMA. NO MALIGNANCY. -SEROSA: UNREMARKABLE. NO MALIGNANCY. - CERVIX: BENIGN SQUAMOUS AND ENDOCERVICAL MUCOSA. NO DYSPLASIA OR MALIGNANCY. - RIGHT OVARY: UNREMARKABLE. NO MALIGNANCY. - RIGHT FALLOPIAN  TUBES: METASTATIC CARCINOMA. 3. Soft tissue, biopsy, sigmoid implant - METASTATIC CARCINOMA. 4. Omentum, resection for tumor - METASTATIC CARCINOMA. 5. Soft tissue, biopsy, pelvic  cecal implant - METASTATIC CARCINOMA. Microscopic Comment 1. OVARY or FALLOPIAN TUBE or PRIMARY PERITONEUM: Procedure: Left salpingo-oophorectomy. Hysterectomy and right salpingo-oophorectomy, omentectomy and implant biopsies. Specimen Integrity: Disrupted. Tumor Site: Left ovary. Ovarian Surface Involvement (required only if applicable): Presumed surface involvement, see comment. Fallopian Tube Surface Involvement (required only if applicable): Can not be determined, see comment. Tumor Size: 13.3 cm. Histologic Type: See comment. Histologic Grade: High grade. Implants (required for advanced stage serous/seromucinous borderline tumors only): Present. Other Tissue/ Organ Involvement: Right fallopian tube, sigmoid implant, omental implant, cecal implant. Largest Extrapelvic Peritoneal Focus (required only if applicable): 1.6 cm. Peritoneal/Ascitic Fluid: N/A. Treatment Effect (required only for high-grade serous carcinomas): Definitive effect not seen. Regional Lymph Nodes: No lymph nodes submitted or found. Pathologic Stage Classification (pTNM, AJCC 8th Edition): ypT3b, ypNX Representative Tumor Block: 1A-F Comment(s): Residual ovarian parenchyma is not identified and the entire mass consists of tumor, thus, surface involvement is presumably present. The left fallopian tube is not grossly or microscopically identified and thus involvement can not be assessed. A prior omental biopsy was sent to Dr. Annitta Jersey for consultation and was diagnosed as high grade carcinoma with hepatoid features. The current tumor is compared to the biopsy and looks morphologically identical.   05/06/2018 Tumor Marker   Patient's tumor was tested for the following markers: CA-125 Results of the tumor marker test revealed 190    06/10/2018 Tumor Marker   Patient's tumor was tested for the following markers: CA-125 Results of the tumor marker test revealed 16.4   07/05/2018 Tumor Marker   Patient's tumor was tested for the following markers: CA-125 Results of the tumor marker test revealed 13.4   08/01/2018 Imaging   1. No evidence of metastatic disease in the chest, abdomen or pelvis. 2. Interval resection of the large left adnexal mass. Haziness of the lower mesenteric and distal pericolonic fat, favor postsurgical change. This study will serve as a new baseline for future surveillance. 3. Chronic findings include: Aortic Atherosclerosis (ICD10-I70.0). Small hiatal hernia. Moderate sigmoid diverticulosis.   08/01/2018 Tumor Marker   Patient's tumor was tested for the following markers: CA-125 Results of the tumor marker test revealed 12.1   08/26/2018 Procedure   Successful right IJ vein Port-A-Cath explant.   02/10/2019 Tumor Marker   Patient's tumor was tested for the following markers: CA-125 Results of the tumor marker test revealed 49.1   02/18/2019 Imaging   1. Interval development of mildly enlarged left para-aortic lymph node measuring 1.0 cm in short axis. This is nonspecific, but the possibility of nodal metastasis warrants consideration. Close attention on follow-up studies is recommended. 2. No other signs of potential metastatic disease noted elsewhere in the abdomen or pelvis. 3. However, there is a focal area of asymmetric mural thickening in the mid to distal sigmoid colon measuring 2.3 x 2.2 cm. The possibility of colonic neoplasm should be considered, and further evaluation with nonemergent outpatient colonoscopy is suggested in the near future to better evaluate this finding. 4. Several small infraumbilical ventral hernias containing short segments of mid to distal small bowel, without evidence of associated bowel incarceration or obstruction at this time. 5. There are calcifications of the aortic  valve. Echocardiographic correlation for evaluation of potential valvular dysfunction may be warranted if clinically indicated. 6. Aortic atherosclerosis. 7. Additional incidental findings, as above.   02/28/2019 Procedure   Successful placement of a right IJ approach Power Port with ultrasound and fluoroscopic guidance. The catheter is ready for use.  03/05/2019 -  Chemotherapy   The patient had carboplatin and Doxil for chemotherapy treatment.       REVIEW OF SYSTEMS:   Constitutional: Denies fevers, chills or abnormal weight loss Eyes: Denies blurriness of vision Ears, nose, mouth, throat, and face: Denies mucositis or sore throat Respiratory: Denies cough, dyspnea or wheezes Cardiovascular: Denies palpitation, chest discomfort or lower extremity swelling Skin: Denies abnormal skin rashes Lymphatics: Denies new lymphadenopathy or easy bruising Behavioral/Psych: Mood is stable, no new changes  All other systems were reviewed with the patient and are negative.  I have reviewed the past medical history, past surgical history, social history and family history with the patient and they are unchanged from previous note.  ALLERGIES:  has No Known Allergies.  MEDICATIONS:  Current Outpatient Medications  Medication Sig Dispense Refill  . acetaminophen (TYLENOL) 500 MG tablet Take 500 mg by mouth every 8 (eight) hours as needed for mild pain or headache.     Marland Kitchen amLODipine (NORVASC) 5 MG tablet Take 5 mg by mouth at bedtime.     . fluticasone (FLONASE) 50 MCG/ACT nasal spray Place 2 sprays into both nostrils 2 (two) times daily. (Patient taking differently: Place 2 sprays into both nostrils as needed. ) 16 g 2  . levothyroxine (SYNTHROID, LEVOTHROID) 88 MCG tablet Take 88 mcg by mouth daily before breakfast.    . lidocaine-prilocaine (EMLA) cream Apply to affected area once 30 g 3  . loratadine (CLARITIN) 10 MG tablet Take 10 mg by mouth daily as needed for allergies.     . metoprolol  succinate (TOPROL-XL) 50 MG 24 hr tablet Take 50 mg by mouth every morning.     . Multiple Vitamins-Minerals (MULTIVITAMINS THER. W/MINERALS) TABS Take 1 tablet by mouth daily.      . naphazoline-pheniramine (NAPHCON-A) 0.025-0.3 % ophthalmic solution Place 1 drop into both eyes daily as needed for eye irritation.    Marland Kitchen olmesartan (BENICAR) 40 MG tablet Take 40 mg by mouth at bedtime.     . ondansetron (ZOFRAN) 8 MG tablet Take 1 tablet (8 mg total) by mouth every 8 (eight) hours as needed. 30 tablet 1  . ondansetron (ZOFRAN-ODT) 8 MG disintegrating tablet Take 1 tablet (8 mg total) by mouth every 8 (eight) hours as needed for nausea or vomiting. 20 tablet 0  . prochlorperazine (COMPAZINE) 10 MG tablet Take 1 tablet (10 mg total) by mouth every 6 (six) hours as needed (Nausea or vomiting). 30 tablet 1   No current facility-administered medications for this visit.     PHYSICAL EXAMINATION: ECOG PERFORMANCE STATUS: 1 - Symptomatic but completely ambulatory  Vitals:   04/02/19 1016  BP: (!) 151/75  Pulse: 83  Resp: 18  Temp: 98.7 F (37.1 C)  SpO2: 99%   Filed Weights   04/02/19 1016  Weight: 175 lb 4.8 oz (79.5 kg)    GENERAL:alert, no distress and comfortable SKIN: skin color, texture, turgor are normal, no rashes or significant lesions EYES: normal, Conjunctiva are pink and non-injected, sclera clear OROPHARYNX:no exudate, no erythema and lips, buccal mucosa, and tongue normal  NECK: supple, thyroid normal size, non-tender, without nodularity LYMPH:  no palpable lymphadenopathy in the cervical, axillary or inguinal LUNGS: clear to auscultation and percussion with normal breathing effort HEART: regular rate & rhythm and no murmurs and no lower extremity edema ABDOMEN:abdomen soft, non-tender and normal bowel sounds Musculoskeletal:no cyanosis of digits and no clubbing  NEURO: alert & oriented x 3 with fluent speech, no focal motor/sensory  deficits  LABORATORY DATA:  I have  reviewed the data as listed    Component Value Date/Time   NA 138 02/28/2019 0910   K 4.2 02/28/2019 0910   CL 107 02/28/2019 0910   CO2 24 02/28/2019 0910   GLUCOSE 127 (H) 02/28/2019 0910   BUN 15 02/28/2019 0910   CREATININE 0.64 02/28/2019 0910   CREATININE 0.72 07/15/2018 1010   CALCIUM 9.2 02/28/2019 0910   PROT 7.7 02/28/2019 0910   ALBUMIN 4.3 02/28/2019 0910   AST 27 02/28/2019 0910   AST 21 07/15/2018 1010   ALT 19 02/28/2019 0910   ALT 22 07/15/2018 1010   ALKPHOS 82 02/28/2019 0910   BILITOT 0.8 02/28/2019 0910   BILITOT 0.3 07/15/2018 1010   GFRNONAA >60 02/28/2019 0910   GFRNONAA >60 07/15/2018 1010   GFRAA >60 02/28/2019 0910   GFRAA >60 07/15/2018 1010    No results found for: SPEP, UPEP  Lab Results  Component Value Date   WBC 4.9 04/02/2019   NEUTROABS 2.4 04/02/2019   HGB 11.5 (L) 04/02/2019   HCT 35.0 (L) 04/02/2019   MCV 89.7 04/02/2019   PLT 242 04/02/2019      Chemistry      Component Value Date/Time   NA 138 02/28/2019 0910   K 4.2 02/28/2019 0910   CL 107 02/28/2019 0910   CO2 24 02/28/2019 0910   BUN 15 02/28/2019 0910   CREATININE 0.64 02/28/2019 0910   CREATININE 0.72 07/15/2018 1010      Component Value Date/Time   CALCIUM 9.2 02/28/2019 0910   ALKPHOS 82 02/28/2019 0910   AST 27 02/28/2019 0910   AST 21 07/15/2018 1010   ALT 19 02/28/2019 0910   ALT 22 07/15/2018 1010   BILITOT 0.8 02/28/2019 0910   BILITOT 0.3 07/15/2018 1010       All questions were answered. The patient knows to call the clinic with any problems, questions or concerns. No barriers to learning was detected.  I spent 15 minutes counseling the patient face to face. The total time spent in the appointment was 20 minutes and more than 50% was on counseling and review of test results  [name], MD 04/02/2019 10:40 AM

## 2019-04-03 LAB — CA 125: Cancer Antigen (CA) 125: 52.6 U/mL — ABNORMAL HIGH (ref 0.0–38.1)

## 2019-04-25 ENCOUNTER — Telehealth: Payer: Self-pay | Admitting: Oncology

## 2019-04-25 NOTE — Telephone Encounter (Signed)
Called [name] back and she said the last fall happened 4 days ago and she did not hit her head, only skinned her knee and elbow. She said Richetta is insisting that she is fine.  [name] thinks she will be ok to be seen on 04/30/19.

## 2019-04-25 NOTE — Telephone Encounter (Signed)
[name] called and said Krisy has fallen a few times in the past couple of weeks and is more unsteady on her feet.  She has not hurt herself except for a scrapped knee and elbow.  [name] said she is not confused but does seem to have declined.  Zmya has told her that she doesn't see well.  She wanted to make [place] aware so that Mazel can go in a wheelchair to her infusion appointment on 04/30/19.  [name] would also like to attend Dr. Calton Dach appointment if possible.

## 2019-04-25 NOTE — Telephone Encounter (Signed)
Does she need evaluation from recent falls? If not, I will just see her next week as scheduled Yes, her family can attend

## 2019-04-30 ENCOUNTER — Inpatient Hospital Stay (HOSPITAL_BASED_OUTPATIENT_CLINIC_OR_DEPARTMENT_OTHER): Payer: Medicare Other | Admitting: Hematology and Oncology

## 2019-04-30 ENCOUNTER — Inpatient Hospital Stay: Payer: Medicare Other

## 2019-04-30 ENCOUNTER — Telehealth: Payer: Self-pay | Admitting: Hematology and Oncology

## 2019-04-30 ENCOUNTER — Inpatient Hospital Stay: Payer: Medicare Other | Attending: Hematology and Oncology

## 2019-04-30 ENCOUNTER — Encounter: Payer: Self-pay | Admitting: Hematology and Oncology

## 2019-04-30 ENCOUNTER — Other Ambulatory Visit: Payer: Self-pay | Admitting: Hematology and Oncology

## 2019-04-30 ENCOUNTER — Other Ambulatory Visit: Payer: Self-pay

## 2019-04-30 VITALS — BP 159/79 | HR 82 | Temp 98.8°F | Resp 18 | Ht 66.0 in | Wt 174.4 lb

## 2019-04-30 DIAGNOSIS — Z7189 Other specified counseling: Secondary | ICD-10-CM

## 2019-04-30 DIAGNOSIS — T451X5A Adverse effect of antineoplastic and immunosuppressive drugs, initial encounter: Secondary | ICD-10-CM | POA: Insufficient documentation

## 2019-04-30 DIAGNOSIS — K5909 Other constipation: Secondary | ICD-10-CM | POA: Insufficient documentation

## 2019-04-30 DIAGNOSIS — C562 Malignant neoplasm of left ovary: Secondary | ICD-10-CM | POA: Insufficient documentation

## 2019-04-30 DIAGNOSIS — Z5111 Encounter for antineoplastic chemotherapy: Secondary | ICD-10-CM | POA: Insufficient documentation

## 2019-04-30 DIAGNOSIS — Z79899 Other long term (current) drug therapy: Secondary | ICD-10-CM | POA: Diagnosis not present

## 2019-04-30 DIAGNOSIS — G620 Drug-induced polyneuropathy: Secondary | ICD-10-CM

## 2019-04-30 DIAGNOSIS — C786 Secondary malignant neoplasm of retroperitoneum and peritoneum: Secondary | ICD-10-CM | POA: Insufficient documentation

## 2019-04-30 LAB — CBC WITH DIFFERENTIAL (CANCER CENTER ONLY)
Abs Immature Granulocytes: 0.06 10*3/uL (ref 0.00–0.07)
Basophils Absolute: 0.1 10*3/uL (ref 0.0–0.1)
Basophils Relative: 1 %
Eosinophils Absolute: 0.1 10*3/uL (ref 0.0–0.5)
Eosinophils Relative: 1 %
HCT: 33.8 % — ABNORMAL LOW (ref 36.0–46.0)
Hemoglobin: 11.5 g/dL — ABNORMAL LOW (ref 12.0–15.0)
Immature Granulocytes: 1 %
Lymphocytes Relative: 28 %
Lymphs Abs: 1.7 10*3/uL (ref 0.7–4.0)
MCH: 30.6 pg (ref 26.0–34.0)
MCHC: 34 g/dL (ref 30.0–36.0)
MCV: 89.9 fL (ref 80.0–100.0)
Monocytes Absolute: 1 10*3/uL (ref 0.1–1.0)
Monocytes Relative: 16 %
Neutro Abs: 3.1 10*3/uL (ref 1.7–7.7)
Neutrophils Relative %: 53 %
Platelet Count: 292 10*3/uL (ref 150–400)
RBC: 3.76 MIL/uL — ABNORMAL LOW (ref 3.87–5.11)
RDW: 17.4 % — ABNORMAL HIGH (ref 11.5–15.5)
WBC Count: 5.9 10*3/uL (ref 4.0–10.5)
nRBC: 0 % (ref 0.0–0.2)

## 2019-04-30 LAB — CMP (CANCER CENTER ONLY)
ALT: 13 U/L (ref 0–44)
AST: 21 U/L (ref 15–41)
Albumin: 3.9 g/dL (ref 3.5–5.0)
Alkaline Phosphatase: 98 U/L (ref 38–126)
Anion gap: 9 (ref 5–15)
BUN: 13 mg/dL (ref 8–23)
CO2: 26 mmol/L (ref 22–32)
Calcium: 9.6 mg/dL (ref 8.9–10.3)
Chloride: 103 mmol/L (ref 98–111)
Creatinine: 0.79 mg/dL (ref 0.44–1.00)
GFR, Est AFR Am: >60 mL/min (ref >60)
GFR, Estimated: >60 mL/min (ref >60)
Glucose, Bld: 121 mg/dL — ABNORMAL HIGH (ref 70–99)
Potassium: 4.7 mmol/L (ref 3.5–5.1)
Sodium: 138 mmol/L (ref 135–145)
Total Bilirubin: 0.4 mg/dL (ref 0.3–1.2)
Total Protein: 7.2 g/dL (ref 6.5–8.1)

## 2019-04-30 MED ORDER — SODIUM CHLORIDE 0.9 % IV SOLN
Freq: Once | INTRAVENOUS | Status: AC
  Administered 2019-04-30: 12:00:00 via INTRAVENOUS
  Filled 2019-04-30: qty 5

## 2019-04-30 MED ORDER — HEPARIN SOD (PORK) LOCK FLUSH 100 UNIT/ML IV SOLN
500.0000 [IU] | Freq: Once | INTRAVENOUS | Status: AC | PRN
  Administered 2019-04-30: 500 [IU]
  Filled 2019-04-30: qty 5

## 2019-04-30 MED ORDER — SODIUM CHLORIDE 0.9% FLUSH
10.0000 mL | Freq: Once | INTRAVENOUS | Status: AC
  Administered 2019-04-30: 10 mL
  Filled 2019-04-30: qty 10

## 2019-04-30 MED ORDER — PALONOSETRON HCL INJECTION 0.25 MG/5ML
INTRAVENOUS | Status: AC
  Filled 2019-04-30: qty 5

## 2019-04-30 MED ORDER — PALONOSETRON HCL INJECTION 0.25 MG/5ML
0.2500 mg | Freq: Once | INTRAVENOUS | Status: AC
  Administered 2019-04-30: 0.25 mg via INTRAVENOUS

## 2019-04-30 MED ORDER — DIPHENHYDRAMINE HCL 50 MG/ML IJ SOLN
INTRAMUSCULAR | Status: AC
  Filled 2019-04-30: qty 1

## 2019-04-30 MED ORDER — DOXORUBICIN HCL LIPOSOMAL CHEMO INJECTION 2 MG/ML
31.0000 mg/m2 | Freq: Once | INTRAVENOUS | Status: AC
  Administered 2019-04-30: 60 mg via INTRAVENOUS
  Filled 2019-04-30: qty 30

## 2019-04-30 MED ORDER — SODIUM CHLORIDE 0.9% FLUSH
10.0000 mL | INTRAVENOUS | Status: DC | PRN
  Administered 2019-04-30: 10 mL
  Filled 2019-04-30: qty 10

## 2019-04-30 MED ORDER — DIPHENHYDRAMINE HCL 50 MG/ML IJ SOLN
25.0000 mg | Freq: Once | INTRAMUSCULAR | Status: AC
  Administered 2019-04-30: 25 mg via INTRAVENOUS

## 2019-04-30 MED ORDER — SODIUM CHLORIDE 0.9 % IV SOLN
Freq: Once | INTRAVENOUS | Status: AC
  Administered 2019-04-30: 14:00:00 via INTRAVENOUS
  Filled 2019-04-30: qty 250

## 2019-04-30 MED ORDER — DEXTROSE 5 % IV SOLN
Freq: Once | INTRAVENOUS | Status: AC
  Administered 2019-04-30: 11:00:00 via INTRAVENOUS
  Filled 2019-04-30: qty 250

## 2019-04-30 MED ORDER — FAMOTIDINE IN NACL 20-0.9 MG/50ML-% IV SOLN
INTRAVENOUS | Status: AC
  Filled 2019-04-30: qty 50

## 2019-04-30 MED ORDER — FAMOTIDINE IN NACL 20-0.9 MG/50ML-% IV SOLN
20.0000 mg | Freq: Once | INTRAVENOUS | Status: AC
  Administered 2019-04-30: 20 mg via INTRAVENOUS

## 2019-04-30 MED ORDER — SODIUM CHLORIDE 0.9 % IV SOLN
387.5000 mg | Freq: Once | INTRAVENOUS | Status: AC
  Administered 2019-04-30: 14:00:00 390 mg via INTRAVENOUS
  Filled 2019-04-30: qty 39

## 2019-04-30 NOTE — Patient Instructions (Addendum)
[facility] Discharge Instructions for Patients Receiving Chemotherapy  Today you received the following chemotherapy agents Doxorubicin-Liposomal, Carboplatin  To help prevent nausea and vomiting after your treatment, we encourage you to take your nausea medication as directed by your MD.   If you develop nausea and vomiting that is not controlled by your nausea medication, call the clinic.   BELOW ARE SYMPTOMS THAT SHOULD BE REPORTED IMMEDIATELY:  *FEVER GREATER THAN 100.5 F  *CHILLS WITH OR WITHOUT FEVER  NAUSEA AND VOMITING THAT IS NOT CONTROLLED WITH YOUR NAUSEA MEDICATION  *UNUSUAL SHORTNESS OF BREATH  *UNUSUAL BRUISING OR BLEEDING  TENDERNESS IN MOUTH AND THROAT WITH OR WITHOUT PRESENCE OF ULCERS  *URINARY PROBLEMS  *BOWEL PROBLEMS  UNUSUAL RASH Items with * indicate a potential emergency and should be followed up as soon as possible.  Feel free to call the clinic should you have any questions or concerns. The clinic phone number is [phone].  Please show the [facility] at check-in to the Emergency Department and triage nurse. Coronavirus (COVID-19) Are you at risk?  Are you at risk for the Coronavirus (COVID-19)?  To be considered HIGH RISK for Coronavirus (COVID-19), you have to meet the following criteria:  . Traveled to [place], [place], [place], [place] or [place]; or in the [place] to [place], [place], [place], or [place]; and have fever, cough, and shortness of breath within the last 2 weeks of travel OR . Been in close contact with a person diagnosed with COVID-19 within the last 2 weeks and have fever, cough, and shortness of breath . IF YOU DO NOT MEET THESE CRITERIA, YOU ARE CONSIDERED LOW RISK FOR COVID-19.  What to do if you are HIGH RISK for COVID-19?  Marland Kitchen If you are having a medical emergency, call 911. . Seek medical care right away. Before you go to a doctor's office, urgent care or emergency department, call  ahead and tell them about your recent travel, contact with someone diagnosed with COVID-19, and your symptoms. You should receive instructions from your physician's office regarding next steps of care.  . When you arrive at healthcare provider, tell the healthcare staff immediately you have returned from visiting [place], [place], [place], [place] or [place]; or traveled in the [place] to [place], [place], [place], or [place]; in the last two weeks or you have been in close contact with a person diagnosed with COVID-19 in the last 2 weeks.   . Tell the health care staff about your symptoms: fever, cough and shortness of breath. . After you have been seen by a medical provider, you will be either: o Tested for (COVID-19) and discharged home on quarantine except to seek medical care if symptoms worsen, and asked to  - Stay home and avoid contact with others until you get your results (4-5 days)  - Avoid travel on public transportation if possible (such as bus, train, or airplane) or o Sent to the Emergency Department by EMS for evaluation, COVID-19 testing, and possible admission depending on your condition and test results.  What to do if you are LOW RISK for COVID-19?  Reduce your risk of any infection by using the same precautions used for avoiding the common cold or flu:  Marland Kitchen Wash your hands often with soap and warm water for at least 20 seconds.  If soap and water are not readily available, use an alcohol-based hand sanitizer with at least 60% alcohol.  . If  If coughing or sneezing, cover your mouth and nose by coughing or sneezing into the elbow areas of your shirt or coat, into a tissue or into your sleeve (not your hands). . Avoid shaking hands with others and consider head nods or verbal greetings only. . Avoid touching your eyes, nose, or mouth with unwashed hands.  . Avoid close contact with people who are sick. . Avoid places or events with large numbers of people in one location,  like concerts or sporting events. . Carefully consider travel plans you have or are making. . If you are planning any travel outside or inside the [place], visit the CDC's Travelers' Health webpage for the latest health notices. . If you have some symptoms but not all symptoms, continue to monitor at home and seek medical attention if your symptoms worsen. . If you are having a medical emergency, call 911.   ADDITIONAL HEALTHCARE OPTIONS FOR PATIENTS  [facility] / e-Visit: [email or web address]         [facility]: [phone]  [facility]: [phone]                   [facility]: [phone]   

## 2019-04-30 NOTE — Telephone Encounter (Signed)
Scheduled appt per 11/11 sch message - pt to get an updated schedule in the treatment area after treatment

## 2019-04-30 NOTE — Progress Notes (Signed)
[facility] OFFICE PROGRESS NOTE  Patient Care Team: Marton Redwood, [name]  ASSESSMENT & PLAN:  Left ovarian epithelial cancer [facility]) So far, she tolerated treatment well without major side effects I believe her recent fall and constipation is unrelated We will proceed with treatment without delay I plan to repeat imaging study next month before her next cycle of treatment for objective assessment of response to therapy  Other constipation She continues to have mild constipation Recommend aggressive laxative therapy  Peripheral neuropathy due to chemotherapy ([place]) Neuropathy stable I do not believe that contributed to her recent fall Observe only for now   Orders Placed This Encounter  Procedures  . CT Abdomen Pelvis W Contrast    Standing Status:   Future    Standing Expiration Date:   04/29/2020    Order Specific Question:   If indicated for the ordered procedure, I authorize the administration of contrast media per Radiology protocol    Answer:   Yes    Order Specific Question:   Preferred imaging location?    Answer:   [facility]    Order Specific Question:   Radiology Contrast Protocol - do NOT remove file path    Answer:   [email or web address]    INTERVAL HISTORY: Please see below for problem oriented charting. 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 Large complex left paramidline pelvic mass likely rises from the left ovary. Together with left  periaortic adenopathy and peritoneal carcinomatosis, findings are worrisome for metastatic ovarian cancer. 2. Small pelvic free fluid. 3.  Aortic atherosclerosis (ICD10-170.0).   01/21/2018 Pathology Results   Omentum, biopsy, dominant omental nodule within the right lower abdominal quadrant - POORLY DIFFERENTIATED CARCINOMA CONSISTENT WITH GYNECOLOGIC PRIMARY. - SEE MICROSCOPIC DESCRIPTION. Microscopic Comment The tumor is characterized by sheets of tumor cells with enlarged nuclei, many containing prominent nucleoli. There are patchy areas with eosinophilic cytoplasmic inclusions. Immunohistochemistry shows positivity with cytokeratin AE1/AE3, cytokeratin 7, estrogen receptor, progesterone receptor, PAX-8, WT-1, and placental alkaline phosphatase (PLAP). The tumor is negative with alpha fetoprotein, human chorionic gonadotropin, CD117, CD10, CD30, CD56, CDX-2, CEA, cytokeratin 20, S100, SOX-10, synaptophysin, chromogranin, D2-40, GATA-3, GCDFP, [place] 3 and Inhibin. 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 The estrogen receptor is 100% with strong staining and the progesterone receptor is 90% with strong staining.   01/21/2018 Procedure   Technically successful CT guided core needle biopsy of dominant omental nodule within the right lower abdominal quadrant, adjacent to the cecum.   01/24/2018 Cancer Staging   Staging form: Ovary, Fallopian Tube, and Primary Peritoneal Carcinoma, AJCC 8th Edition - Clinical: Stage IIIC (cT3c, cN1b, cM0) - Signed by [name], MD on 01/24/2018   01/25/2018 Tumor Marker   Patient's tumor was tested for the following markers: AFP Results of the tumor marker test revealed 4.7   01/31/2018 Surgery   Pre-operative Diagnosis: Ovarian cancer NOS  Post-operative Diagnosis: At least Stage 3 ovarian cancer   Operation: Laparoscopic omental biopsy  Surgeon: Fabio Neighbors  Gerarda Fraction, MD  Specimens:  Omentum  Operative Findings: Omental nodule >2cm. Large left pelvic mass 12-15cm with sigmoid draped medially and potentially adherent to colon. The mass appeared to have a 3cm cystic area that was ruptured preoperatively. Uterus appears normal with small anterior fibroid ~1cm. Right adnexa nonenlarged. Small bowel without evidence of disease. No diaphragmatic disease.    01/31/2018 Pathology Results   Omentum, resection for tumor - HIGH GRADE CARCINOMA. - SEE MICROSCOPIC DESCRIPTION. Microscopic Comment This case is sent to Dr. Annitta Jersey for consultation. The case was discussed with Dr. Gerarda Fraction on 02/01/18. (JDP:kh 02/01/18) The omentum shows nodules of high grade carcinoma characterized by diffuse sheets of malignant cells with enlarged nuclei with prominent nucleoli. There is moderate to abundant eosinophilic cytoplasm and frequent mitotic figures. 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 The entire specimen is examined histologically and there are nodules of tumor throughout the specimen with identical morphology. Additional immunohistochemistry for hepatoid markers will be performed and reported as an addendum   02/11/2018 Procedure   Status post right IJ port catheter placement. Catheter ready for use.   02/14/2018 Tumor Marker   Patient's tumor was tested for the following markers: CA-125 Results of the tumor marker test revealed 1523   02/15/2018 -  Chemotherapy   The patient had carboplatin and taxol. Taxol dose reduced from cycle 2 onwards   02/15/2018 - 07/05/2018 Chemotherapy   The patient had carboplatin and taxol x 6   02/23/2018 Imaging   Interval development of abdominal ascites.  Decrease in omental  carcinomatosis, likely due to chemotherapy.  No significant change in multilobulated complex left pelvic mass, which compresses the pelvic structures and displaces them to the right.  Interval development of mild right hydronephrosis and hydroureter, possibly postobstructive.   03/08/2018 Tumor Marker   Patient's tumor was tested for the following markers: CA-125 Results of the tumor marker test revealed 1844   03/29/2018 Tumor Marker   Patient's tumor was tested for the following markers: CA-125 Results of the tumor marker test revealed 1141   04/10/2018 Imaging   1. New omental nodule. Otherwise, left ovarian mass and right paracolic gutter nodule have decreased in size in the interval. 2. Small ascites, increased. 3. Endometrial cavity may be dilated and contains fluid. Please correlate clinically. 4. Mild prominence of the ureters bilaterally, possibly due to large left adnexal mass. 5. Aortic atherosclerosis (ICD10-170.0). Coronary artery calcification.   04/25/2018 Surgery   Procedure(s) Performed:   1. Exploratory laparotomy  2. TAH/Bilateral salpingo-oophorectomy with radical tumor debulking for ovarian cancer . 3. Omentectomy  Surgeon: Bernita Raisin, MD Specimens: Bilateral tubes and ovaries, omentum, sigmoid nodule, cecal nodule   Operative Findings: Large left sided ovarian tumor wedged retroperitoneal and inferiorly into pelvis with some adherence to sigmoid mesentary. Some tumor nodules on surface of sigmoid epiploica that was removed. ~1-2 cm tumor implant lateral to cecum also excised. No obvious omental disease. This represented an optimal cytoreduction (R0) with no gross visible disease remaining. No carcinomatosis. Diaphragms and small bowel /mesentary all free of disease.    04/25/2018 Pathology Results   1. 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 - PRESUMED OVARIAN SURFACE INVOLVEMENT. - DEFINITIVE FALLOPIAN  TUBE  NOT IDENTIFIED. - SEE COMMENT AND ONCOLOGY TABLE. 2. Uterus and cervix, right tube and ovary - UTERUS: -ENDOMETRIUM: INACTIVE ENDOMETRIUM. NO HYPERPLASIA OR MALIGNANCY. -MYOMETRIUM: LEIOMYOMA. NO MALIGNANCY. -SEROSA: UNREMARKABLE. NO MALIGNANCY. - CERVIX: BENIGN SQUAMOUS AND ENDOCERVICAL MUCOSA. NO DYSPLASIA OR MALIGNANCY. - RIGHT OVARY: UNREMARKABLE. NO MALIGNANCY. - RIGHT FALLOPIAN TUBES: METASTATIC CARCINOMA. 3. Soft tissue, biopsy, sigmoid implant - METASTATIC CARCINOMA. 4. Omentum, resection for tumor - METASTATIC CARCINOMA. 5. Soft tissue, biopsy, pelvic cecal implant - METASTATIC CARCINOMA. Microscopic Comment 1. OVARY or FALLOPIAN TUBE or PRIMARY PERITONEUM: Procedure: Left salpingo-oophorectomy. Hysterectomy and right salpingo-oophorectomy, omentectomy and implant biopsies. Specimen Integrity: Disrupted. Tumor Site: Left ovary. Ovarian Surface Involvement (required only if applicable): Presumed surface involvement, see comment. Fallopian Tube Surface Involvement (required only if applicable): Can not be determined, see comment. Tumor Size: 13.3 cm. Histologic Type: See comment. Histologic Grade: High grade. Implants (required for advanced stage serous/seromucinous borderline tumors only): Present. Other Tissue/ Organ Involvement: Right fallopian tube, sigmoid implant, omental implant, cecal implant. Largest Extrapelvic Peritoneal Focus (required only if applicable): 1.6 cm. Peritoneal/Ascitic Fluid: N/A. Treatment Effect (required only for high-grade serous carcinomas): Definitive effect not seen. Regional Lymph Nodes: No lymph nodes submitted or found. Pathologic Stage Classification (pTNM, AJCC 8th Edition): ypT3b, ypNX Representative Tumor Block: 1A-F Comment(s): Residual ovarian parenchyma is not identified and the entire mass consists of tumor, thus, surface involvement is presumably present. The left fallopian tube is not grossly or microscopically identified  and thus involvement can not be assessed. A prior omental biopsy was sent to Dr. Annitta Jersey for consultation and was diagnosed as high grade carcinoma with hepatoid features. The current tumor is compared to the biopsy and looks morphologically identical.   05/06/2018 Tumor Marker   Patient's tumor was tested for the following markers: CA-125 Results of the tumor marker test revealed 190   06/10/2018 Tumor Marker   Patient's tumor was tested for the following markers: CA-125 Results of the tumor marker test revealed 16.4   07/05/2018 Tumor Marker   Patient's tumor was tested for the following markers: CA-125 Results of the tumor marker test revealed 13.4   08/01/2018 Imaging   1. No evidence of metastatic disease in the chest, abdomen or pelvis. 2. Interval resection of the large left adnexal mass. Haziness of the lower mesenteric and distal pericolonic fat, favor postsurgical change. This study will serve as a new baseline for future surveillance. 3. Chronic findings include: Aortic Atherosclerosis (ICD10-I70.0). Small hiatal hernia. Moderate sigmoid diverticulosis.   08/01/2018 Tumor Marker   Patient's tumor was tested for the following markers: CA-125 Results of the tumor marker test revealed 12.1   08/26/2018 Procedure   Successful right IJ vein Port-A-Cath explant.   02/10/2019 Tumor Marker   Patient's tumor was tested for the following markers: CA-125 Results of the tumor marker test revealed 49.1   02/18/2019 Imaging   1. Interval development of mildly enlarged left para-aortic lymph node measuring 1.0 cm in short axis. This is nonspecific, but the possibility of nodal metastasis warrants consideration. Close attention on follow-up studies is recommended. 2. No other signs of potential metastatic disease noted elsewhere in the abdomen or pelvis. 3. However, there is a focal area of asymmetric mural thickening in the mid to distal sigmoid colon measuring 2.3 x 2.2 cm. The  possibility of colonic neoplasm should be considered, and further evaluation with nonemergent outpatient colonoscopy is suggested in the near future to better evaluate this finding. 4. Several small infraumbilical ventral hernias containing short segments  of mid to distal small bowel, without evidence of associated bowel incarceration or obstruction at this time. 5. There are calcifications of the aortic valve. Echocardiographic correlation for evaluation of potential valvular dysfunction may be warranted if clinically indicated. 6. Aortic atherosclerosis. 7. Additional incidental findings, as above.   02/28/2019 Procedure   Successful placement of a right IJ approach Power Port with ultrasound and fluoroscopic guidance. The catheter is ready for use.     03/05/2019 -  Chemotherapy   The patient had carboplatin and Doxil for chemotherapy treatment.     04/02/2019 Tumor Marker   Patient's tumor was tested for the following markers: CA-125 Results of the tumor marker test revealed 52.6.     REVIEW OF SYSTEMS:   Constitutional: Denies fevers, chills or abnormal weight loss Eyes: Denies blurriness of vision Ears, nose, mouth, throat, and face: Denies mucositis or sore throat Respiratory: Denies cough, dyspnea or wheezes Cardiovascular: Denies palpitation, chest discomfort or lower extremity swelling Skin: Denies abnormal skin rashes Lymphatics: Denies new lymphadenopathy or easy bruising Neurological:Denies numbness, tingling or new weaknesses Behavioral/Psych: Mood is stable, no new changes  All other systems were reviewed with the patient and are negative.  I have reviewed the past medical history, past surgical history, social history and family history with the patient and they are unchanged from previous note.  ALLERGIES:  has No Known Allergies.  MEDICATIONS:  Current Outpatient Medications  Medication Sig Dispense Refill  . acetaminophen (TYLENOL) 500 MG tablet Take 500 mg by  mouth every 8 (eight) hours as needed for mild pain or headache.     Marland Kitchen amLODipine (NORVASC) 5 MG tablet Take 5 mg by mouth at bedtime.     . fluticasone (FLONASE) 50 MCG/ACT nasal spray Place 2 sprays into both nostrils 2 (two) times daily. (Patient taking differently: Place 2 sprays into both nostrils as needed. ) 16 g 2  . levothyroxine (SYNTHROID, LEVOTHROID) 88 MCG tablet Take 88 mcg by mouth daily before breakfast.    . lidocaine-prilocaine (EMLA) cream Apply to affected area once 30 g 3  . loratadine (CLARITIN) 10 MG tablet Take 10 mg by mouth daily as needed for allergies.     . metoprolol succinate (TOPROL-XL) 50 MG 24 hr tablet Take 50 mg by mouth every morning.     . Multiple Vitamins-Minerals (MULTIVITAMINS THER. W/MINERALS) TABS Take 1 tablet by mouth daily.      . naphazoline-pheniramine (NAPHCON-A) 0.025-0.3 % ophthalmic solution Place 1 drop into both eyes daily as needed for eye irritation.    Marland Kitchen olmesartan (BENICAR) 40 MG tablet Take 40 mg by mouth at bedtime.     . ondansetron (ZOFRAN) 8 MG tablet Take 1 tablet (8 mg total) by mouth every 8 (eight) hours as needed. 30 tablet 1  . ondansetron (ZOFRAN-ODT) 8 MG disintegrating tablet Take 1 tablet (8 mg total) by mouth every 8 (eight) hours as needed for nausea or vomiting. 20 tablet 0  . prochlorperazine (COMPAZINE) 10 MG tablet Take 1 tablet (10 mg total) by mouth every 6 (six) hours as needed (Nausea or vomiting). 30 tablet 1   No current facility-administered medications for this visit.     PHYSICAL EXAMINATION: ECOG PERFORMANCE STATUS: 1 - Symptomatic but completely ambulatory  Vitals:   04/30/19 1023  BP: (!) 159/79  Pulse: 82  Resp: 18  Temp: 98.8 F (37.1 C)  SpO2: 97%   Filed Weights   04/30/19 1023  Weight: 174 lb 6.4 oz (79.1 kg)  GENERAL:alert, no distress and comfortable SKIN: skin color, texture, turgor are normal, no rashes or significant lesions EYES: normal, Conjunctiva are pink and non-injected,  sclera clear OROPHARYNX:no exudate, no erythema and lips, buccal mucosa, and tongue normal  NECK: supple, thyroid normal size, non-tender, without nodularity LYMPH:  no palpable lymphadenopathy in the cervical, axillary or inguinal LUNGS: clear to auscultation and percussion with normal breathing effort HEART: regular rate & rhythm and no murmurs and no lower extremity edema ABDOMEN:abdomen soft, non-tender and normal bowel sounds Musculoskeletal:no cyanosis of digits and no clubbing  NEURO: alert & oriented x 3 with fluent speech, no focal motor/sensory deficits  LABORATORY DATA:  I have reviewed the data as listed    Component Value Date/Time   NA 138 04/30/2019 1005   K 4.7 04/30/2019 1005   CL 103 04/30/2019 1005   CO2 26 04/30/2019 1005   GLUCOSE 121 (H) 04/30/2019 1005   BUN 13 04/30/2019 1005   CREATININE 0.79 04/30/2019 1005   CALCIUM 9.6 04/30/2019 1005   PROT 7.2 04/30/2019 1005   ALBUMIN 3.9 04/30/2019 1005   AST 21 04/30/2019 1005   ALT 13 04/30/2019 1005   ALKPHOS 98 04/30/2019 1005   BILITOT 0.4 04/30/2019 1005   GFRNONAA >60 04/30/2019 1005   GFRAA >60 04/30/2019 1005    No results found for: SPEP, UPEP  Lab Results  Component Value Date   WBC 5.9 04/30/2019   NEUTROABS 3.1 04/30/2019   HGB 11.5 (L) 04/30/2019   HCT 33.8 (L) 04/30/2019   MCV 89.9 04/30/2019   PLT 292 04/30/2019      Chemistry      Component Value Date/Time   NA 138 04/30/2019 1005   K 4.7 04/30/2019 1005   CL 103 04/30/2019 1005   CO2 26 04/30/2019 1005   BUN 13 04/30/2019 1005   CREATININE 0.79 04/30/2019 1005      Component Value Date/Time   CALCIUM 9.6 04/30/2019 1005   ALKPHOS 98 04/30/2019 1005   AST 21 04/30/2019 1005   ALT 13 04/30/2019 1005   BILITOT 0.4 04/30/2019 1005       All questions were answered. The patient knows to call the clinic with any problems, questions or concerns. No barriers to learning was detected.  I spent 15 minutes counseling the patient  face to face. The total time spent in the appointment was 20 minutes and more than 50% was on counseling and review of test results  [name], MD 04/30/2019 10:44 AM

## 2019-04-30 NOTE — Assessment & Plan Note (Signed)
So far, she tolerated treatment well without major side effects I believe her recent fall and constipation is unrelated We will proceed with treatment without delay I plan to repeat imaging study next month before her next cycle of treatment for objective assessment of response to therapy

## 2019-04-30 NOTE — Assessment & Plan Note (Signed)
She continues to have mild constipation Recommend aggressive laxative therapy

## 2019-04-30 NOTE — Assessment & Plan Note (Signed)
Neuropathy stable I do not believe that contributed to her recent fall Observe only for now

## 2019-04-30 NOTE — Patient Instructions (Signed)

## 2019-05-01 ENCOUNTER — Encounter: Payer: Self-pay | Admitting: Hematology and Oncology

## 2019-05-01 LAB — CA 125: Cancer Antigen (CA) 125: 65.7 U/mL — ABNORMAL HIGH (ref 0.0–38.1)

## 2019-05-14 ENCOUNTER — Telehealth: Payer: Self-pay

## 2019-05-14 NOTE — Telephone Encounter (Signed)
-----   Message from [name], MD sent at 05/14/2019 12:15 PM EST ----- Regarding: appt next week on 12/3 Can you call her daughter (patient is heard of hearing) if she is willing to change her appt to e-visits? I see her Shannon Ellis is active. I can share screen with her so she can see the CT scan But if it's too complicated, we can keep her appt as is

## 2019-05-14 NOTE — Telephone Encounter (Signed)
Spoke with dtr, [name], by phone.  She is agreeable to [employer].  High priority msg sent to schedulers.

## 2019-05-18 ENCOUNTER — Encounter: Payer: Self-pay | Admitting: Hematology and Oncology

## 2019-05-21 ENCOUNTER — Ambulatory Visit (HOSPITAL_COMMUNITY)
Admission: RE | Admit: 2019-05-21 | Discharge: 2019-05-21 | Disposition: A | Discharge status: Home / Self Care | Payer: Medicare Other | Source: Ambulatory Visit | Attending: Hematology and Oncology | Admitting: Hematology and Oncology

## 2019-05-21 ENCOUNTER — Encounter (HOSPITAL_COMMUNITY): Payer: Self-pay

## 2019-05-21 ENCOUNTER — Other Ambulatory Visit: Payer: Self-pay

## 2019-05-21 DIAGNOSIS — C562 Malignant neoplasm of left ovary: Secondary | ICD-10-CM | POA: Diagnosis not present

## 2019-05-21 DIAGNOSIS — C569 Malignant neoplasm of unspecified ovary: Secondary | ICD-10-CM | POA: Diagnosis not present

## 2019-05-21 DIAGNOSIS — C785 Secondary malignant neoplasm of large intestine and rectum: Secondary | ICD-10-CM | POA: Diagnosis not present

## 2019-05-21 MED ORDER — SODIUM CHLORIDE (PF) 0.9 % IJ SOLN
INTRAMUSCULAR | Status: AC
  Filled 2019-05-21: qty 50

## 2019-05-21 MED ORDER — IOHEXOL 300 MG/ML  SOLN
100.0000 mL | Freq: Once | INTRAMUSCULAR | Status: AC | PRN
  Administered 2019-05-21: 09:00:00 100 mL via INTRAVENOUS

## 2019-05-22 ENCOUNTER — Telehealth: Payer: Self-pay | Admitting: Oncology

## 2019-05-22 ENCOUNTER — Inpatient Hospital Stay: Payer: Medicare Other | Attending: Hematology and Oncology | Admitting: Hematology and Oncology

## 2019-05-22 ENCOUNTER — Encounter: Payer: Self-pay | Admitting: Hematology and Oncology

## 2019-05-22 DIAGNOSIS — Z79899 Other long term (current) drug therapy: Secondary | ICD-10-CM | POA: Insufficient documentation

## 2019-05-22 DIAGNOSIS — R1011 Right upper quadrant pain: Secondary | ICD-10-CM | POA: Diagnosis not present

## 2019-05-22 DIAGNOSIS — Z7189 Other specified counseling: Secondary | ICD-10-CM | POA: Diagnosis not present

## 2019-05-22 DIAGNOSIS — C562 Malignant neoplasm of left ovary: Secondary | ICD-10-CM | POA: Diagnosis not present

## 2019-05-22 DIAGNOSIS — Z5111 Encounter for antineoplastic chemotherapy: Secondary | ICD-10-CM | POA: Insufficient documentation

## 2019-05-22 DIAGNOSIS — C786 Secondary malignant neoplasm of retroperitoneum and peritoneum: Secondary | ICD-10-CM | POA: Insufficient documentation

## 2019-05-22 NOTE — Assessment & Plan Note (Signed)
I have reviewed results of her rising tumor marker and CT imaging which show disease progression The patient is currently consider platinum refractory We discussed the risk and benefits of pursuing further palliative chemotherapy  We discussed the role of chemotherapy. The intent is of palliative intent.  I shared with her publication below:  Randomized Phase III Trial of Gemcitabine Compared With Pegylated Liposomal Doxorubicin in Patients With Platinum-Resistant Ovarian Cancer Lorenso Quarry. Arlyss Queen [place], Santiago Glad Teneriello, Willette Alma, Scott D. McMeekin, Meredeth Ide, Veleta Miners, [facility], [place]. Evonnie Pat, and [employer] [place] A B S T R A C T Purpose Ovarian cancer (OC) patients experiencing progressive disease (PD) within 6 months of platinum based therapy in the primary setting are considered platinum resistant (Pt-R). Currently, pegylated liposomal doxorubicin (PLD) is a standard of care for treatment of recurrent Pt-R disease. On the basis of promising phase II results, gemcitabine was compared with PLD for efficacy and safety in taxane-pretreated Pt-R OC patients. Patients and Methods Patients (n  195) with Pt-R OC were randomly assigned to either gemcitabine 1,000 mg/m2 (days 1 and 8; every 21 days) or PLD 50 mg/m2 (day 1; every 28 days) until PD or undue toxicity. Optional cross-over therapy was allowed at PD or at withdrawal because of toxicity. Primary end point was progression-free survival (PFS). Additional end points included tumor response, time to treatment failure, survival, and quality of life. Results In the gemcitabine and PLD groups, median PFS was 3.6 v 3.1 months; median overall survival was 12.7 v 13.5 months; overall response rate (ORR) was 6.1% v 8.3%; and in the subset of patients with measurable disease, ORR was 9.2% v 11.7%, respectively. None of the efficacy end points showed a statistically significant difference between  treatment groups. The PLD group experienced significantly more hand-foot syndrome and mucositis; the gemcitabine group experienced significantly more constipation, nausea/vomiting, fatigue, and neutropenia but not febrile neutropenia. Conclusion Although this was not designed as an equivalency study, gemcitabine and PLD seem to have a comparable therapeutic index in this population of Pt-R taxane-pretreated OC patients. Single agent gemcitabine may be an acceptable alternative to PLD for patients with platinum resistant ovarian cancer J Clin Oncol 25:2811-2818.  2007 by [place] of Clinical Oncology  I will get her scheduled to start next week Due to her age, I will prescribe upfront 20% dose adjustment

## 2019-05-22 NOTE — Telephone Encounter (Signed)
Called Shannon Ellis and advised her that 1st dose will be scheduled for 05/30/19 and that the schedulers will call with the appointment time.

## 2019-05-22 NOTE — Assessment & Plan Note (Signed)
We had numerous goals of care discussions in the past She understood the goals of treatment is strictly palliative in nature We discussed prognosis with or without therapy

## 2019-05-22 NOTE — Progress Notes (Signed)
DISCONTINUE ON PATHWAY REGIMEN - Ovarian     A cycle is every 28 days:     Carboplatin      Liposomal doxorubicin   **Always confirm dose/schedule in your pharmacy ordering system**  REASON: Disease Progression PRIOR TREATMENT: [id number]: Liposomal Doxorubicin (Doxil) 30 mg/m2 + Carboplatin AUC=5 q28 Days; Stop Treatment After 6 Cycles If Complete Response, Otherwise Continue Treatment Until Progression or Unacceptable Toxicity TREATMENT RESPONSE: Progressive Disease (PD)  START OFF PATHWAY REGIMEN - Ovarian   [id number]:Gemcitabine 1,000 mg/m2 D1, 8  q21 Days:   A cycle is every 21 days:     Gemcitabine   **Always confirm dose/schedule in your pharmacy ordering system**  Patient Characteristics: Recurrent or Progressive Disease, Third Line Therapy, Platinum Resistant or < 6 Months Since Last Platinum Therapy Therapeutic Status: Recurrent or Progressive Disease BRCA Mutation Status: Absent Line of Therapy: Third Line  Intent of Therapy: Non-Curative / Palliative Intent, Discussed with Patient

## 2019-05-22 NOTE — Telephone Encounter (Signed)
Shannon Ellis called and said Shannon Ellis has made her decision and would like gemcitabine.

## 2019-05-22 NOTE — Assessment & Plan Note (Signed)
This is due to peritoneal disease We discussed the importance of aggressive laxative therapy

## 2019-05-22 NOTE — Progress Notes (Signed)
HEMATOLOGY-ONCOLOGY ELECTRONIC VISIT PROGRESS NOTE  Patient Care Team: Marton Redwood, [name]  I connected with by [facility] video conference and verified that I am speaking with the correct person using two identifiers.  I discussed the limitations, risks, security and privacy concerns of performing an evaluation and management service by EP[facility] and the availability of in person appointments.  I also discussed with the patient that there may be a patient responsible charge related to this service. The patient expressed understanding and agreed to proceed.   ASSESSMENT & PLAN:  Left ovarian epithelial cancer (Shannon Ellis) I have reviewed results of her rising tumor marker and CT imaging which show disease progression The patient is currently consider platinum refractory We discussed the risk and benefits of pursuing further palliative chemotherapy  We discussed the role of chemotherapy. The intent is of palliative intent.  I shared with her publication below:  Randomized Phase III Trial of Gemcitabine Compared With Pegylated Liposomal Doxorubicin in Patients With Platinum-Resistant Ovarian Cancer [place]. [place], Shannon Ellis, Shannon Ellis, Shannon Ellis, Shannon Ellis, Shannon Ellis, [facility], [place]. Shannon Ellis, and Shannon Ellis [place] A B S T R A C T Purpose Ovarian cancer (OC) patients experiencing progressive disease (PD) within 6 months of platinum based therapy in the primary setting are considered platinum resistant (Pt-R). Currently, pegylated liposomal doxorubicin (PLD) is a standard of care for treatment of recurrent Pt-R disease. On the basis of promising phase II results, gemcitabine was compared with PLD for efficacy and safety in taxane-pretreated Pt-R OC patients. Patients and Methods Patients (n  195) with Pt-R OC were randomly assigned to either gemcitabine 1,000 mg/m2 (days 1 and 8; every 21  days) or PLD 50 mg/m2 (day 1; every 28 days) until PD or undue toxicity. Optional cross-over therapy was allowed at PD or at withdrawal because of toxicity. Primary end point was progression-free survival (PFS). Additional end points included tumor response, time to treatment failure, survival, and quality of life. Results In the gemcitabine and PLD groups, median PFS was 3.6 v 3.1 months; median overall survival was 12.7 v 13.5 months; overall response rate (ORR) was 6.1% v 8.3%; and in the subset of patients with measurable disease, ORR was 9.2% v 11.7%, respectively. None of the efficacy end points showed a statistically significant difference between treatment groups. The PLD group experienced significantly more hand-foot syndrome and mucositis; the gemcitabine group experienced significantly more constipation, nausea/vomiting, fatigue, and neutropenia but not febrile neutropenia. Conclusion Although this was not designed as an equivalency study, gemcitabine and PLD seem to have a comparable therapeutic index in this population of Pt-R taxane-pretreated OC patients. Single agent gemcitabine may be an acceptable alternative to PLD for patients with platinum resistant ovarian cancer J Clin Oncol 25:2811-2818.  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 We discussed the importance of aggressive laxative therapy  Goals of care, counseling/discussion We had numerous goals of care discussions in the past She understood the goals of treatment is strictly palliative in nature We discussed prognosis with or without therapy   Orders Placed This Encounter  Procedures   CBC with Differential ([place] Only)    Standing Status:   Standing    Number of Occurrences:   20  Standing Expiration Date:   05/21/2020   CMP ([place] only)     Standing Status:   Standing    Number of Occurrences:   20    Standing Expiration Date:   05/21/2020    INTERVAL HISTORY: Please see below for problem oriented charting. 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 Large complex left paramidline pelvic mass likely rises from the left ovary. Together with left periaortic adenopathy and peritoneal carcinomatosis, findings are worrisome for metastatic ovarian cancer. 2. Small pelvic free fluid. 3.  Aortic atherosclerosis (ICD10-170.0).   01/21/2018 Pathology Results   Omentum, biopsy, dominant omental nodule within the right lower abdominal quadrant - POORLY DIFFERENTIATED CARCINOMA CONSISTENT WITH GYNECOLOGIC PRIMARY. - SEE MICROSCOPIC DESCRIPTION. Microscopic Comment The tumor is characterized by sheets of tumor cells with enlarged nuclei, many containing prominent nucleoli. There are patchy areas with eosinophilic cytoplasmic inclusions. Immunohistochemistry shows positivity with cytokeratin AE1/AE3, cytokeratin 7, estrogen receptor, progesterone receptor, PAX-8, WT-1, and placental alkaline phosphatase (PLAP). The tumor is negative with alpha fetoprotein, human chorionic gonadotropin, CD117, CD10, CD30, CD56, CDX-2, CEA, cytokeratin 20, S100, SOX-10, synaptophysin, chromogranin, D2-40, GATA-3, GCDFP, Glypican 3 and Inhibin. 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 The estrogen receptor is 100% with strong staining and the progesterone receptor is 90% with strong staining.   01/21/2018 Procedure   Technically successful CT guided core needle biopsy of dominant  omental nodule within the right lower abdominal quadrant, adjacent to the cecum.   01/24/2018 Cancer Staging   Staging form: Ovary, Fallopian Tube, and Primary Peritoneal Carcinoma, AJCC 8th Edition - Clinical: Stage IIIC (cT3c, cN1b, cM0) - Signed by [name], MD on 01/24/2018   01/25/2018 Tumor Marker   Patient's tumor was tested for the following markers: AFP Results of the tumor marker test revealed 4.7   01/31/2018 Surgery   Pre-operative Diagnosis: Ovarian cancer NOS  Post-operative Diagnosis: At least Stage 3 ovarian cancer   Operation: Laparoscopic omental biopsy  Surgeon: Mart Piggs, MD  Specimens: Omentum  Operative Findings: Omental nodule >2cm. Large left pelvic mass 12-15cm with sigmoid draped medially and potentially adherent to colon. The mass appeared to have a 3cm cystic area that was ruptured preoperatively. Uterus appears normal with small anterior fibroid ~1cm. Right adnexa nonenlarged. Small bowel without evidence of disease. No diaphragmatic disease.    01/31/2018 Pathology Results   Omentum, resection for tumor - HIGH GRADE CARCINOMA. - SEE MICROSCOPIC DESCRIPTION. Microscopic Comment This case is sent to Dr. Annitta Jersey for consultation. The case was discussed with Dr. Gerarda Fraction on 02/01/18. (JDP:kh 02/01/18) The omentum shows nodules of high grade carcinoma characterized by diffuse sheets of malignant cells with enlarged nuclei with prominent nucleoli. There is moderate to abundant eosinophilic cytoplasm and frequent mitotic figures. 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 The entire specimen is examined histologically and there are nodules of tumor throughout  the specimen with  identical morphology. Additional immunohistochemistry for hepatoid markers will be performed and reported as an addendum   02/11/2018 Procedure   Status post right IJ port catheter placement. Catheter ready for use.   02/14/2018 Tumor Marker   Patient's tumor was tested for the following markers: CA-125 Results of the tumor marker test revealed 1523   02/15/2018 -  Chemotherapy   The patient had carboplatin and taxol. Taxol dose reduced from cycle 2 onwards   02/15/2018 - 07/05/2018 Chemotherapy   The patient had carboplatin and taxol x 6   02/23/2018 Imaging   Interval development of abdominal ascites.  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  No significant change in multilobulated complex left pelvic mass, which compresses the pelvic structures and displaces them to the right.  Interval development of mild right hydronephrosis and hydroureter, possibly postobstructive.   03/08/2018 Tumor Marker   Patient's tumor was tested for the following markers: CA-125 Results of the tumor marker test revealed 1844   03/29/2018 Tumor Marker   Patient's tumor was tested for the following markers: CA-125 Results of the tumor marker test revealed 1141   04/10/2018 Imaging   1. New omental nodule. Otherwise, left ovarian mass and right paracolic gutter nodule have decreased in size in the interval. 2. Small ascites, increased. 3. Endometrial cavity may be dilated and contains fluid. Please correlate clinically. 4. Mild prominence of the ureters bilaterally, possibly due to large left adnexal mass. 5. Aortic atherosclerosis (ICD10-170.0). Coronary artery calcification.   04/25/2018 Surgery   Procedure(s) Performed:   1. Exploratory laparotomy  2. TAH/Bilateral salpingo-oophorectomy with radical tumor debulking for ovarian cancer . 3. Omentectomy  Surgeon: Bernita Raisin, MD Specimens: Bilateral tubes and ovaries, omentum, sigmoid nodule, cecal nodule    Operative Findings: Large left sided ovarian tumor wedged retroperitoneal and inferiorly into pelvis with some adherence to sigmoid mesentary. Some tumor nodules on surface of sigmoid epiploica that was removed. ~1-2 cm tumor implant lateral to cecum also excised. No obvious omental disease. This represented an optimal cytoreduction (R0) with no gross visible disease remaining. No carcinomatosis. Diaphragms and small bowel /mesentary all free of disease.    04/25/2018 Pathology Results   1. 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 - PRESUMED OVARIAN SURFACE INVOLVEMENT. - DEFINITIVE FALLOPIAN TUBE NOT IDENTIFIED. - SEE COMMENT AND ONCOLOGY TABLE. 2. Uterus and cervix, right tube and ovary - UTERUS: -ENDOMETRIUM: INACTIVE ENDOMETRIUM. NO HYPERPLASIA OR MALIGNANCY. -MYOMETRIUM: LEIOMYOMA. NO MALIGNANCY. -SEROSA: UNREMARKABLE. NO MALIGNANCY. - CERVIX: BENIGN SQUAMOUS AND ENDOCERVICAL MUCOSA. NO DYSPLASIA OR MALIGNANCY. - RIGHT OVARY: UNREMARKABLE. NO MALIGNANCY. - RIGHT FALLOPIAN TUBES: METASTATIC CARCINOMA. 3. Soft tissue, biopsy, sigmoid implant - METASTATIC CARCINOMA. 4. Omentum, resection for tumor - METASTATIC CARCINOMA. 5. Soft tissue, biopsy, pelvic cecal implant - METASTATIC CARCINOMA. Microscopic Comment 1. OVARY or FALLOPIAN TUBE or PRIMARY PERITONEUM: Procedure: Left salpingo-oophorectomy. Hysterectomy and right salpingo-oophorectomy, omentectomy and implant biopsies. Specimen Integrity: Disrupted. Tumor Site: Left ovary. Ovarian Surface Involvement (required only if applicable): Presumed surface involvement, see comment. Fallopian Tube Surface Involvement (required only if applicable): Can not be determined, see comment. Tumor Size: 13.3 cm. Histologic Type: See comment. Histologic Grade: High grade. Implants (required for advanced stage serous/seromucinous borderline tumors only): Present. Other Tissue/ Organ Involvement:  Right fallopian tube, sigmoid implant, omental implant, cecal implant. Largest Extrapelvic Peritoneal Focus (required only if applicable): 1.6 cm. Peritoneal/Ascitic Fluid: N/A. Treatment Effect (required only for high-grade serous carcinomas): Definitive effect  not seen. Regional Lymph Nodes: No lymph nodes submitted or found. Pathologic Stage Classification (pTNM, AJCC 8th Edition): ypT3b, ypNX Representative Tumor Block: 1A-F Comment(s): Residual ovarian parenchyma is not identified and the entire mass consists of tumor, thus, surface involvement is presumably present. The left fallopian tube is not grossly or microscopically identified and thus involvement can not be assessed. A prior omental biopsy was sent to Dr. Annitta Jersey for consultation and was diagnosed as high grade carcinoma with hepatoid features. The current tumor is compared to the biopsy and looks morphologically identical.   05/06/2018 Tumor Marker   Patient's tumor was tested for the following markers: CA-125 Results of the tumor marker test revealed 190   06/10/2018 Tumor Marker   Patient's tumor was tested for the following markers: CA-125 Results of the tumor marker test revealed 16.4   07/05/2018 Tumor Marker   Patient's tumor was tested for the following markers: CA-125 Results of the tumor marker test revealed 13.4   08/01/2018 Imaging   1. No evidence of metastatic disease in the chest, abdomen or pelvis. 2. Interval resection of the large left adnexal mass. Haziness of the lower mesenteric and distal pericolonic fat, favor postsurgical change. This study will serve as a new baseline for future surveillance. 3. Chronic findings include: Aortic Atherosclerosis (ICD10-I70.0). Small hiatal hernia. Moderate sigmoid diverticulosis.   08/01/2018 Tumor Marker   Patient's tumor was tested for the following markers: CA-125 Results of the tumor marker test revealed 12.1   08/26/2018 Procedure   Successful right IJ vein  Port-A-Cath explant.   02/10/2019 Tumor Marker   Patient's tumor was tested for the following markers: CA-125 Results of the tumor marker test revealed 49.1   02/18/2019 Imaging   1. Interval development of mildly enlarged left para-aortic lymph node measuring 1.0 cm in short axis. This is nonspecific, but the possibility of nodal metastasis warrants consideration. Close attention on follow-up studies is recommended. 2. No other signs of potential metastatic disease noted elsewhere in the abdomen or pelvis. 3. However, there is a focal area of asymmetric mural thickening in the mid to distal sigmoid colon measuring 2.3 x 2.2 cm. The possibility of colonic neoplasm should be considered, and further evaluation with nonemergent outpatient colonoscopy is suggested in the near future to better evaluate this finding. 4. Several small infraumbilical ventral hernias containing short segments of mid to distal small bowel, without evidence of associated bowel incarceration or obstruction at this time. 5. There are calcifications of the aortic valve. Echocardiographic correlation for evaluation of potential valvular dysfunction may be warranted if clinically indicated. 6. Aortic atherosclerosis. 7. Additional incidental findings, as above.   02/28/2019 Procedure   Successful placement of a right IJ approach Power Port with ultrasound and fluoroscopic guidance. The catheter is ready for use.     03/05/2019 - 05/27/2019 Chemotherapy   The patient had carboplatin and Doxil for chemotherapy treatment.     04/02/2019 Tumor Marker   Patient's tumor was tested for the following markers: CA-125 Results of the tumor marker test revealed 52.6.   04/30/2019 Tumor Marker   Patient's tumor was tested for the following markers: CA-125 Results of the tumor marker test revealed 65.7   05/20/2019 Imaging   1. Serosal metastasis along the sigmoid colon is stable from 02/19/2019. Nodules in the sigmoid mesentery are new  from 08/01/2018 and indicative of metastatic disease. 2. Left periaortic lymph node is no longer pathologically enlarged. 3. Multiple midline ventral hernias, described above. 4. Aortic atherosclerosis (ICD10-170.0).  Coronary artery calcification.   05/30/2019 -  Chemotherapy   The patient had gemcitabine (GEMZAR) 1,520 mg in sodium chloride 0.9 % 250 mL chemo infusion, 800 mg/m2 = 1,520 mg (80 % of original dose 1,000 mg/m2), Intravenous,  Once, 0 of 4 cycles Dose modification: 800 mg/m2 (80 % of original dose 1,000 mg/m2, Cycle 1, Reason: Patient Age)  for chemotherapy treatment.      REVIEW OF SYSTEMS:   Constitutional: Denies fevers, chills or abnormal weight loss Eyes: Denies blurriness of vision Ears, nose, mouth, throat, and face: Denies mucositis or sore throat Respiratory: Denies cough, dyspnea or wheezes Cardiovascular: Denies palpitation, chest discomfort Skin: Denies abnormal skin rashes Lymphatics: Denies new lymphadenopathy or easy bruising Neurological:Denies numbness, tingling or new weaknesses Behavioral/Psych: Mood is stable, no new changes  Extremities: No lower extremity edema All other systems were reviewed with the patient and are negative.  I have reviewed the past medical history, past surgical history, social history and family history with the patient and they are unchanged from previous note.  ALLERGIES:  has No Known Allergies.  MEDICATIONS:  Current Outpatient Medications  Medication Sig Dispense Refill   acetaminophen (TYLENOL) 500 MG tablet Take 500 mg by mouth every 8 (eight) hours as needed for mild pain or headache.      amLODipine (NORVASC) 5 MG tablet Take 5 mg by mouth at bedtime.      fluticasone (FLONASE) 50 MCG/ACT nasal spray Place 2 sprays into both nostrils 2 (two) times daily. (Patient taking differently: Place 2 sprays into both nostrils as needed. ) 16 g 2   levothyroxine (SYNTHROID, LEVOTHROID) 88 MCG tablet Take 88 mcg by mouth  daily before breakfast.     lidocaine-prilocaine (EMLA) cream Apply to affected area once 30 g 3   loratadine (CLARITIN) 10 MG tablet Take 10 mg by mouth daily as needed for allergies.      metoprolol succinate (TOPROL-XL) 50 MG 24 hr tablet Take 50 mg by mouth every morning.      Multiple Vitamins-Minerals (MULTIVITAMINS THER. W/MINERALS) TABS Take 1 tablet by mouth daily.       naphazoline-pheniramine (NAPHCON-A) 0.025-0.3 % ophthalmic solution Place 1 drop into both eyes daily as needed for eye irritation.     olmesartan (BENICAR) 40 MG tablet Take 40 mg by mouth at bedtime.      ondansetron (ZOFRAN) 8 MG tablet Take 1 tablet (8 mg total) by mouth every 8 (eight) hours as needed. 30 tablet 1   ondansetron (ZOFRAN-ODT) 8 MG disintegrating tablet Take 1 tablet (8 mg total) by mouth every 8 (eight) hours as needed for nausea or vomiting. 20 tablet 0   prochlorperazine (COMPAZINE) 10 MG tablet Take 1 tablet (10 mg total) by mouth every 6 (six) hours as needed (Nausea or vomiting). 30 tablet 1   No current facility-administered medications for this visit.     PHYSICAL EXAMINATION: ECOG PERFORMANCE STATUS: 1 - Symptomatic but completely ambulatory  LABORATORY DATA:  I have reviewed the data as listed CMP Latest Ref Rng & Units 04/30/2019 04/02/2019 02/28/2019  Glucose 70 - 99 mg/dL 121(H) 140(H) 127(H)  BUN 8 - 23 mg/dL '13 14 15  ' Creatinine 0.44 - 1.00 mg/dL 0.79 0.92 0.64  Sodium 135 - 145 mmol/L 138 139 138  Potassium 3.5 - 5.1 mmol/L 4.7 3.7 4.2  Chloride 98 - 111 mmol/L 103 108 107  CO2 22 - 32 mmol/L '26 25 24  ' Calcium 8.9 - 10.3 mg/dL 9.6 8.8(L) 9.2  Total  Protein 6.5 - 8.1 g/dL 7.2 6.9 7.7  Total Bilirubin 0.3 - 1.2 mg/dL 0.4 0.4 0.8  Alkaline Phos 38 - 126 U/L 98 82 82  AST 15 - 41 U/L '21 18 27  ' ALT 0 - 44 U/L '13 14 19    ' Lab Results  Component Value Date   WBC 5.9 04/30/2019   HGB 11.5 (L) 04/30/2019   HCT 33.8 (L) 04/30/2019   MCV 89.9 04/30/2019   PLT 292  04/30/2019   NEUTROABS 3.1 04/30/2019     RADIOGRAPHIC STUDIES: I have reviewed imaging study with the patient I have personally reviewed the radiological images as listed and agreed with the findings in the report. Ct Abdomen Pelvis W Contrast  Result Date: 05/21/2019 CLINICAL DATA:  Ovarian cancer, on chemotherapy. Left lower quadrant and left hip pain. EXAM: CT ABDOMEN AND PELVIS WITH CONTRAST TECHNIQUE: Multidetector CT imaging of the abdomen and pelvis was performed using the standard protocol following bolus administration of intravenous contrast. CONTRAST:  161m OMNIPAQUE IOHEXOL 300 MG/ML  SOLN COMPARISON:  02/19/2019 and 08/01/2018. FINDINGS: Lower chest: Subpleural nodules in the left lower lobe measure 2 mm or less in size, nonspecific. Heart is enlarged. Coronary artery calcification. No pericardial or pleural effusion. Hepatobiliary: Millimetric low-attenuation lesion in the periphery of the right hepatic lobe is too small to characterize. Liver and gallbladder are otherwise unremarkable. No biliary ductal dilatation. Pancreas: Negative. Spleen: Negative. Adrenals/Urinary Tract: Adrenal glands are unremarkable. Subcentimeter low-attenuation lesions in both kidneys are too small to characterize. Kidneys are otherwise unremarkable. Ureters are decompressed. Bladder is partially obscured by streak artifact from a right hip arthroplasty. Stomach/Bowel: Small hiatal hernia. Stomach is decompressed. There are small midline ventral hernias containing knuckles of small bowel or colon. An infraumbilical midline pelvic wall hernia contains a short segment of unobstructed small bowel. Small bowel and majority of the colon are otherwise unremarkable. There is a serosal implant along the sigmoid colon, measuring 1.8 x 2.7 cm (2/60), similar when remeasured on the prior exam. Vascular/Lymphatic: Atherosclerotic calcification of the aorta without aneurysm. No pathologically enlarged lymph nodes.  Specifically, a left periaortic lymph node has decreased in size to 5 mm, previously 10 mm on 02/19/2019. Reproductive: Hysterectomy. Right adnexa is largely obscured by streak artifact from a right hip arthroplasty. Other: There are 2 soft tissue nodules in the sigmoid mesentery, measuring 1.5 cm and 1.2 cm, respectively (2/63). Comparison with 02/18/2026 is difficult due to streak artifact. Lesions appear new from 08/01/2018. No free fluid. Musculoskeletal: Right hip arthroplasty. Degenerative changes in the spine. No worrisome lytic or sclerotic lesions. IMPRESSION: 1. Serosal metastasis along the sigmoid colon is stable from 02/19/2019. Nodules in the sigmoid mesentery are new from 08/01/2018 and indicative of metastatic disease. 2. Left periaortic lymph node is no longer pathologically enlarged. 3. Multiple midline ventral hernias, described above. 4. Aortic atherosclerosis (ICD10-170.0). Coronary artery calcification. Electronically Signed   By: MLorin PicketM.D.   On: 05/21/2019 10:36    I discussed the assessment and treatment plan with the patient. The patient was provided an opportunity to ask questions and all were answered. The patient agreed with the plan and demonstrated an understanding of the instructions. The patient was advised to call back or seek an in-person evaluation if the symptoms worsen or if the condition fails to improve as anticipated.    I spent 30 minutes counseling the patient face to face. The total time spent in the appointment was 40 minutes and more than 50% was  on counseling and review of test results  [name], MD 05/22/2019 11:54 AM

## 2019-05-22 NOTE — Telephone Encounter (Signed)
I will schedule first dose 12/11

## 2019-05-23 ENCOUNTER — Telehealth: Payer: Self-pay | Admitting: Hematology and Oncology

## 2019-05-23 NOTE — Telephone Encounter (Signed)
Confirmed next appointment 12/11 with patient. Patient to get updated schedule 12/11.

## 2019-05-26 ENCOUNTER — Encounter: Payer: Self-pay | Admitting: Hematology and Oncology

## 2019-05-28 ENCOUNTER — Ambulatory Visit: Payer: Medicare Other

## 2019-05-28 ENCOUNTER — Other Ambulatory Visit: Payer: Medicare Other

## 2019-05-30 ENCOUNTER — Other Ambulatory Visit: Payer: Self-pay

## 2019-05-30 ENCOUNTER — Telehealth: Payer: Self-pay | Admitting: Hematology and Oncology

## 2019-05-30 ENCOUNTER — Inpatient Hospital Stay: Payer: Medicare Other

## 2019-05-30 ENCOUNTER — Encounter: Payer: Self-pay | Admitting: Hematology and Oncology

## 2019-05-30 ENCOUNTER — Inpatient Hospital Stay (HOSPITAL_BASED_OUTPATIENT_CLINIC_OR_DEPARTMENT_OTHER): Payer: Medicare Other | Admitting: Hematology and Oncology

## 2019-05-30 VITALS — BP 174/84

## 2019-05-30 DIAGNOSIS — C562 Malignant neoplasm of left ovary: Secondary | ICD-10-CM

## 2019-05-30 DIAGNOSIS — Z79899 Other long term (current) drug therapy: Secondary | ICD-10-CM | POA: Diagnosis not present

## 2019-05-30 DIAGNOSIS — R1011 Right upper quadrant pain: Secondary | ICD-10-CM

## 2019-05-30 DIAGNOSIS — Z7189 Other specified counseling: Secondary | ICD-10-CM | POA: Diagnosis not present

## 2019-05-30 DIAGNOSIS — C786 Secondary malignant neoplasm of retroperitoneum and peritoneum: Secondary | ICD-10-CM | POA: Diagnosis not present

## 2019-05-30 DIAGNOSIS — Z5111 Encounter for antineoplastic chemotherapy: Secondary | ICD-10-CM | POA: Diagnosis present

## 2019-05-30 DIAGNOSIS — K5909 Other constipation: Secondary | ICD-10-CM

## 2019-05-30 LAB — CMP (CANCER CENTER ONLY)
ALT: 14 U/L (ref 0–44)
AST: 20 U/L (ref 15–41)
Albumin: 3.9 g/dL (ref 3.5–5.0)
Alkaline Phosphatase: 84 U/L (ref 38–126)
Anion gap: 8 (ref 5–15)
BUN: 14 mg/dL (ref 8–23)
CO2: 27 mmol/L (ref 22–32)
Calcium: 9.3 mg/dL (ref 8.9–10.3)
Chloride: 103 mmol/L (ref 98–111)
Creatinine: 0.88 mg/dL (ref 0.44–1.00)
GFR, Est AFR Am: >60 mL/min (ref >60)
GFR, Estimated: 60 mL/min — ABNORMAL LOW (ref >60)
Glucose, Bld: 113 mg/dL — ABNORMAL HIGH (ref 70–99)
Potassium: 4.1 mmol/L (ref 3.5–5.1)
Sodium: 138 mmol/L (ref 135–145)
Total Bilirubin: 0.4 mg/dL (ref 0.3–1.2)
Total Protein: 7.1 g/dL (ref 6.5–8.1)

## 2019-05-30 LAB — CBC WITH DIFFERENTIAL (CANCER CENTER ONLY)
Abs Immature Granulocytes: 0.07 10*3/uL (ref 0.00–0.07)
Basophils Absolute: 0.1 10*3/uL (ref 0.0–0.1)
Basophils Relative: 1 %
Eosinophils Absolute: 0.1 10*3/uL (ref 0.0–0.5)
Eosinophils Relative: 1 %
HCT: 34.6 % — ABNORMAL LOW (ref 36.0–46.0)
Hemoglobin: 11.8 g/dL — ABNORMAL LOW (ref 12.0–15.0)
Immature Granulocytes: 1 %
Lymphocytes Relative: 24 %
Lymphs Abs: 1.7 10*3/uL (ref 0.7–4.0)
MCH: 31.9 pg (ref 26.0–34.0)
MCHC: 34.1 g/dL (ref 30.0–36.0)
MCV: 93.5 fL (ref 80.0–100.0)
Monocytes Absolute: 1 10*3/uL (ref 0.1–1.0)
Monocytes Relative: 14 %
Neutro Abs: 4.2 10*3/uL (ref 1.7–7.7)
Neutrophils Relative %: 59 %
Platelet Count: 294 10*3/uL (ref 150–400)
RBC: 3.7 MIL/uL — ABNORMAL LOW (ref 3.87–5.11)
RDW: 17.5 % — ABNORMAL HIGH (ref 11.5–15.5)
WBC Count: 7 10*3/uL (ref 4.0–10.5)
nRBC: 0 % (ref 0.0–0.2)

## 2019-05-30 MED ORDER — SODIUM CHLORIDE 0.9 % IV SOLN
800.0000 mg/m2 | Freq: Once | INTRAVENOUS | Status: AC
  Administered 2019-05-30: 14:00:00 1520 mg via INTRAVENOUS
  Filled 2019-05-30: qty 39.98

## 2019-05-30 MED ORDER — HEPARIN SOD (PORK) LOCK FLUSH 100 UNIT/ML IV SOLN
500.0000 [IU] | Freq: Once | INTRAVENOUS | Status: AC | PRN
  Administered 2019-05-30: 500 [IU]
  Filled 2019-05-30: qty 5

## 2019-05-30 MED ORDER — SODIUM CHLORIDE 0.9 % IV SOLN
Freq: Once | INTRAVENOUS | Status: AC
  Administered 2019-05-30: 13:00:00 via INTRAVENOUS
  Filled 2019-05-30: qty 250

## 2019-05-30 MED ORDER — SODIUM CHLORIDE 0.9% FLUSH
10.0000 mL | Freq: Once | INTRAVENOUS | Status: AC
  Administered 2019-05-30: 10 mL
  Filled 2019-05-30: qty 10

## 2019-05-30 MED ORDER — SODIUM CHLORIDE 0.9% FLUSH
10.0000 mL | INTRAVENOUS | Status: DC | PRN
  Administered 2019-05-30: 10 mL
  Filled 2019-05-30: qty 10

## 2019-05-30 MED ORDER — PROCHLORPERAZINE MALEATE 10 MG PO TABS
ORAL_TABLET | ORAL | Status: AC
  Filled 2019-05-30: qty 1

## 2019-05-30 MED ORDER — TRAMADOL HCL 50 MG PO TABS: 50.0000 mg | ORAL_TABLET | Freq: Four times a day (QID) | ORAL | 0 refills | Status: DC | PRN

## 2019-05-30 MED ORDER — PROCHLORPERAZINE MALEATE 10 MG PO TABS
10.0000 mg | ORAL_TABLET | Freq: Once | ORAL | Status: AC
  Administered 2019-05-30: 10 mg via ORAL

## 2019-05-30 MED FILL — traMADol HCL 50 MG TABS: 50 | 7 days supply | Qty: 30 | Fill #0

## 2019-05-30 NOTE — Assessment & Plan Note (Signed)
This is due to peritoneal disease We discussed the importance of aggressive laxative therapy We discussed pain management

## 2019-05-30 NOTE — Assessment & Plan Note (Signed)
We had extensive discussions about the role of palliative chemotherapy and treatment options  We discussed the role of chemotherapy. The intent is of palliative intent.  I shared with her publication below:  Randomized Phase III Trial of Gemcitabine Compared With Pegylated Liposomal Doxorubicin in Patients With Platinum-Resistant Ovarian Cancer Lorenso Quarry. Arlyss Queen [place], Santiago Glad Teneriello, Willette Alma, Scott D. McMeekin, Meredeth Ide, Veleta Miners, [facility], [place]. Evonnie Pat, and [employer] [place] A B S T R A C T Purpose Ovarian cancer (OC) patients experiencing progressive disease (PD) within 6 months of platinum based therapy in the primary setting are considered platinum resistant (Pt-R). Currently, pegylated liposomal doxorubicin (PLD) is a standard of care for treatment of recurrent Pt-R disease. On the basis of promising phase II results, gemcitabine was compared with PLD for efficacy and safety in taxane-pretreated Pt-R OC patients. Patients and Methods Patients (n  195) with Pt-R OC were randomly assigned to either gemcitabine 1,000 mg/m2 (days 1 and 8; every 21 days) or PLD 50 mg/m2 (day 1; every 28 days) until PD or undue toxicity. Optional cross-over therapy was allowed at PD or at withdrawal because of toxicity. Primary end point was progression-free survival (PFS). Additional end points included tumor response, time to treatment failure, survival, and quality of life. Results In the gemcitabine and PLD groups, median PFS was 3.6 v 3.1 months; median overall survival was 12.7 v 13.5 months; overall response rate (ORR) was 6.1% v 8.3%; and in the subset of patients with measurable disease, ORR was 9.2% v 11.7%, respectively. None of the efficacy end points showed a statistically significant difference between treatment groups. The PLD group experienced significantly more hand-foot syndrome and mucositis; the gemcitabine group experienced  significantly more constipation, nausea/vomiting, fatigue, and neutropenia but not febrile neutropenia. Conclusion Although this was not designed as an equivalency study, gemcitabine and PLD seem to have a comparable therapeutic index in this population of Pt-R taxane-pretreated OC patients. Single agent gemcitabine may be an acceptable alternative to PLD for patients with platinum resistant ovarian cancer J Clin Oncol 25:2811-2818.  2007 by American Society of Clinical Oncology  Due to her age, I will start with mild dose reduction I will schedule treatment on days 1, 8 every 21 days She does not need prophylactic G-CSF support I recommend minimum 3-4 cycles of treatment before repeating imaging study for objective assessment of response to treatment

## 2019-05-30 NOTE — Assessment & Plan Note (Signed)
She continues to have mild constipation Recommend aggressive laxative therapy

## 2019-05-30 NOTE — Patient Instructions (Signed)
[facility] Discharge Instructions for Patients Receiving Chemotherapy  Today you received the following chemotherapy agents Gemcitabine  To help prevent nausea and vomiting after your treatment, we encourage you to take your nausea medication as directed.   If you develop nausea and vomiting that is not controlled by your nausea medication, call the clinic.   BELOW ARE SYMPTOMS THAT SHOULD BE REPORTED IMMEDIATELY:  *FEVER GREATER THAN 100.5 F  *CHILLS WITH OR WITHOUT FEVER  NAUSEA AND VOMITING THAT IS NOT CONTROLLED WITH YOUR NAUSEA MEDICATION  *UNUSUAL SHORTNESS OF BREATH  *UNUSUAL BRUISING OR BLEEDING  TENDERNESS IN MOUTH AND THROAT WITH OR WITHOUT PRESENCE OF ULCERS  *URINARY PROBLEMS  *BOWEL PROBLEMS  UNUSUAL RASH Items with * indicate a potential emergency and should be followed up as soon as possible.  Feel free to call the clinic should you have any questions or concerns. The clinic phone number is [phone].  Please show the CHEMO ALERT CARD at check-in to the Emergency Department and triage nurse.  Gemcitabine injection What is this medicine? GEMCITABINE (jem SYE ta been) is a chemotherapy drug. This medicine is used to treat many types of cancer like breast cancer, lung cancer, pancreatic cancer, and ovarian cancer. This medicine may be used for other purposes; ask your health care provider or pharmacist if you have questions. COMMON BRAND NAME(S): Gemzar, Infugem What should I tell my health care provider before I take this medicine? They need to know if you have any of these conditions:  blood disorders  infection  kidney disease  liver disease  lung or breathing disease, like asthma  recent or ongoing radiation therapy  an unusual or allergic reaction to gemcitabine, other chemotherapy, other medicines, foods, dyes, or preservatives  pregnant or trying to get pregnant  breast-feeding How should I use this medicine? This drug  is given as an infusion into a vein. It is administered in a hospital or clinic by a specially trained health care professional. Talk to your pediatrician regarding the use of this medicine in children. Special care may be needed. Overdosage: If you think you have taken too much of this medicine contact a poison control center or emergency room at once. NOTE: This medicine is only for you. Do not share this medicine with others. What if I miss a dose? It is important not to miss your dose. Call your doctor or health care professional if you are unable to keep an appointment. What may interact with this medicine?  medicines to increase blood counts like filgrastim, pegfilgrastim, sargramostim  some other chemotherapy drugs like cisplatin  vaccines Talk to your doctor or health care professional before taking any of these medicines:  acetaminophen  aspirin  ibuprofen  ketoprofen  naproxen This list may not describe all possible interactions. Give your health care provider a list of all the medicines, herbs, non-prescription drugs, or dietary supplements you use. Also tell them if you smoke, drink alcohol, or use illegal drugs. Some items may interact with your medicine. What should I watch for while using this medicine? Visit your doctor for checks on your progress. This drug may make you feel generally unwell. This is not uncommon, as chemotherapy can affect healthy cells as well as cancer cells. Report any side effects. Continue your course of treatment even though you feel ill unless your doctor tells you to stop. In some cases, you may be given additional medicines to help with side effects. Follow all directions for their use. Call   your doctor or health care professional for advice if you get a fever, chills or sore throat, or other symptoms of a cold or flu. Do not treat yourself. This drug decreases your body's ability to fight infections. Try to avoid being around people who are  sick. This medicine may increase your risk to bruise or bleed. Call your doctor or health care professional if you notice any unusual bleeding. Be careful brushing and flossing your teeth or using a toothpick because you may get an infection or bleed more easily. If you have any dental work done, tell your dentist you are receiving this medicine. Avoid taking products that contain aspirin, acetaminophen, ibuprofen, naproxen, or ketoprofen unless instructed by your doctor. These medicines may hide a fever. Do not become pregnant while taking this medicine or for 6 months after stopping it. Women should inform their doctor if they wish to become pregnant or think they might be pregnant. Men should not father a child while taking this medicine and for 3 months after stopping it. There is a potential for serious side effects to an unborn child. Talk to your health care professional or pharmacist for more information. Do not breast-feed an infant while taking this medicine or for at least 1 week after stopping it. Men should inform their doctors if they wish to father a child. This medicine may lower sperm counts. Talk with your doctor or health care professional if you are concerned about your fertility. What side effects may I notice from receiving this medicine? Side effects that you should report to your doctor or health care professional as soon as possible:  allergic reactions like skin rash, itching or hives, swelling of the face, lips, or tongue  breathing problems  pain, redness, or irritation at site where injected  signs and symptoms of a dangerous change in heartbeat or heart rhythm like chest pain; dizziness; fast or irregular heartbeat; palpitations; feeling faint or lightheaded, falls; breathing problems  signs of decreased platelets or bleeding - bruising, pinpoint red spots on the skin, black, tarry stools, blood in the urine  signs of decreased red blood cells - unusually weak or  tired, feeling faint or lightheaded, falls  signs of infection - fever or chills, cough, sore throat, pain or difficulty passing urine  signs and symptoms of kidney injury like trouble passing urine or change in the amount of urine  signs and symptoms of liver injury like dark yellow or brown urine; general ill feeling or flu-like symptoms; light-colored stools; loss of appetite; nausea; right upper belly pain; unusually weak or tired; yellowing of the eyes or skin  swelling of ankles, feet, hands Side effects that usually do not require medical attention (report to your doctor or health care professional if they continue or are bothersome):  constipation  diarrhea  hair loss  loss of appetite  nausea  rash  vomiting This list may not describe all possible side effects. Call your doctor for medical advice about side effects. You may report side effects to FDA at 1-800-FDA-1088. Where should I keep my medicine? This drug is given in a hospital or clinic and will not be stored at home. NOTE: This sheet is a summary. It may not cover all possible information. If you have questions about this medicine, talk to your doctor, pharmacist, or health care provider.  2020 [employer]vier/Gold Standard (2017-08-29 18:06:11)  

## 2019-05-30 NOTE — Progress Notes (Signed)
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[facility]) We had extensive discussions about the role of palliative chemotherapy and treatment options  We discussed the role of chemotherapy. The intent is of palliative intent.  I shared with her publication below:  Randomized Phase III Trial of Gemcitabine Compared With Pegylated Liposomal Doxorubicin in Patients With Platinum-Resistant Ovarian Cancer Lorenso Quarry. Arlyss Queen Danube, Santiago Glad Teneriello, Willette Alma, Scott D. McMeekin, Meredeth Ide, Veleta Miners, [facility], [place]. Evonnie Pat, and [employer] [place] A B S T R A C T Purpose Ovarian cancer (OC) patients experiencing progressive disease (PD) within 6 months of platinum based therapy in the primary setting are considered platinum resistant (Pt-R). Currently, pegylated liposomal doxorubicin (PLD) is a standard of care for treatment of recurrent Pt-R disease. On the basis of promising phase II results, gemcitabine was compared with PLD for efficacy and safety in taxane-pretreated Pt-R OC patients. Patients and Methods Patients (n  195) with Pt-R OC were randomly assigned to either gemcitabine 1,000 mg/m2 (days 1 and 8; every 21 days) or PLD 50 mg/m2 (day 1; every 28 days) until PD or undue toxicity. Optional cross-over therapy was allowed at PD or at withdrawal because of toxicity. Primary end point was progression-free survival (PFS). Additional end points included tumor response, time to treatment failure, survival, and quality of life. Results In the gemcitabine and PLD groups, median PFS was 3.6 v 3.1 months; median overall survival was 12.7 v 13.5 months; overall response rate (ORR) was 6.1% v 8.3%; and in the subset of patients with measurable disease, ORR was 9.2% v 11.7%, respectively. None of the efficacy end points  showed a statistically significant difference between treatment groups. The PLD group experienced significantly more hand-foot syndrome and mucositis; the gemcitabine group experienced significantly more constipation, nausea/vomiting, fatigue, and neutropenia but not febrile neutropenia. Conclusion Although this was not designed as an equivalency study, gemcitabine and PLD seem to have a comparable therapeutic index in this population of Pt-R taxane-pretreated OC patients. Single agent gemcitabine may be an acceptable alternative to PLD for patients with platinum resistant ovarian cancer J Clin Oncol 25:2811-2818.  2007 by American Society of Clinical Oncology  Due to her age, I will start with mild dose reduction I will schedule treatment on days 1, 8 every 21 days She does not need prophylactic G-CSF support I recommend minimum 3-4 cycles of treatment before repeating imaging study for objective assessment of response to treatment  Abdominal pain This is due to peritoneal disease We discussed the importance of aggressive laxative therapy We discussed pain management  Other constipation She continues to have mild constipation Recommend aggressive laxative therapy  Goals of care, counseling/discussion We had numerous goals of care discussions in the past She understood the goals of treatment is strictly palliative in nature   No orders of the defined types were placed in this encounter.   INTERVAL HISTORY: Please see below for problem oriented charting. 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 Large complex left paramidline pelvic mass likely rises from the left ovary. Together with left periaortic adenopathy and peritoneal carcinomatosis, findings are worrisome for metastatic ovarian cancer. 2. Small pelvic free fluid. 3.  Aortic atherosclerosis (ICD10-170.0).   01/21/2018 Pathology Results   Omentum, biopsy, dominant omental nodule within the right lower abdominal quadrant - POORLY DIFFERENTIATED CARCINOMA CONSISTENT WITH GYNECOLOGIC PRIMARY. - SEE MICROSCOPIC DESCRIPTION. Microscopic Comment The tumor is characterized by sheets of tumor cells with enlarged nuclei, many containing prominent nucleoli. There are patchy areas with eosinophilic cytoplasmic inclusions. Immunohistochemistry shows positivity with cytokeratin AE1/AE3, cytokeratin 7, estrogen receptor, progesterone receptor, PAX-8, WT-1, and placental alkaline phosphatase (PLAP). The tumor is negative with alpha fetoprotein, human chorionic gonadotropin, CD117, CD10, CD30, CD56, CDX-2, CEA, cytokeratin 20, S100, SOX-10, synaptophysin, chromogranin, D2-40, GATA-3, GCDFP, Glypican 3 and Inhibin. 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 The estrogen receptor is 100% with strong staining and the progesterone receptor is 90% with strong staining.   01/21/2018 Procedure   Technically successful CT guided core needle biopsy of dominant omental nodule within the right lower abdominal quadrant, adjacent to the cecum.   01/24/2018 Cancer Staging   Staging form: Ovary, Fallopian Tube, and Primary Peritoneal Carcinoma, AJCC 8th Edition - Clinical: Stage IIIC (cT3c, cN1b, cM0) - Signed by [name], MD on 01/24/2018   01/25/2018 Tumor Marker   Patient's tumor was tested for the following markers: AFP Results of the tumor marker test revealed 4.7   01/31/2018 Surgery   Pre-operative Diagnosis: Ovarian cancer NOS  Post-operative Diagnosis: At  least Stage 3 ovarian cancer   Operation: Laparoscopic omental biopsy  Surgeon: Mart Piggs, MD  Specimens: Omentum  Operative Findings: Omental nodule >2cm. Large left pelvic mass 12-15cm with sigmoid draped medially and potentially adherent to colon. The mass appeared to have a 3cm cystic area that was ruptured preoperatively. Uterus appears normal with small anterior fibroid ~1cm. Right adnexa nonenlarged. Small bowel without evidence of disease. No diaphragmatic disease.    01/31/2018 Pathology Results   Omentum, resection for tumor - HIGH GRADE CARCINOMA. - SEE MICROSCOPIC DESCRIPTION. Microscopic Comment This case is sent to Dr. Annitta Jersey for consultation. The case was discussed with Dr. Gerarda Fraction on 02/01/18. (JDP:kh 02/01/18) The omentum shows nodules of high grade carcinoma characterized by diffuse sheets of malignant cells with enlarged nuclei with prominent nucleoli. There is moderate to abundant eosinophilic cytoplasm and frequent mitotic figures. 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 The entire specimen is examined histologically and there are nodules of tumor throughout the specimen with identical morphology. Additional immunohistochemistry for hepatoid markers will be performed and reported as an addendum   02/11/2018 Procedure   Status post right IJ port catheter placement. Catheter ready for use.   02/14/2018 Tumor Marker   Patient's tumor was tested for the following markers: CA-125 Results of the tumor marker test revealed 1523   02/15/2018 -  Chemotherapy   The patient had carboplatin and taxol. Taxol dose reduced from cycle 2 onwards   02/15/2018 - 07/05/2018 Chemotherapy   The patient  had carboplatin and taxol x 6  02/23/2018 Imaging   Interval development of abdominal ascites.  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  No significant change in multilobulated complex left pelvic mass, which compresses the pelvic structures and displaces them to the right.  Interval development of mild right hydronephrosis and hydroureter, possibly postobstructive.   03/08/2018 Tumor Marker   Patient's tumor was tested for the following markers: CA-125 Results of the tumor marker test revealed 1844   03/29/2018 Tumor Marker   Patient's tumor was tested for the following markers: CA-125 Results of the tumor marker test revealed 1141   04/10/2018 Imaging   1. New omental nodule. Otherwise, left ovarian mass and right paracolic gutter nodule have decreased in size in the interval. 2. Small ascites, increased. 3. Endometrial cavity may be dilated and contains fluid. Please correlate clinically. 4. Mild prominence of the ureters bilaterally, possibly due to large left adnexal mass. 5. Aortic atherosclerosis (ICD10-170.0). Coronary artery calcification.   04/25/2018 Surgery   Procedure(s) Performed:   1. Exploratory laparotomy  2. TAH/Bilateral salpingo-oophorectomy with radical tumor debulking for ovarian cancer . 3. Omentectomy  Surgeon: Bernita Raisin, MD Specimens: Bilateral tubes and ovaries, omentum, sigmoid nodule, cecal nodule   Operative Findings: Large left sided ovarian tumor wedged retroperitoneal and inferiorly into pelvis with some adherence to sigmoid mesentary. Some tumor nodules on surface of sigmoid epiploica that was removed. ~1-2 cm tumor implant lateral to cecum also excised. No obvious omental disease. This represented an optimal cytoreduction (R0) with no gross visible disease remaining. No carcinomatosis. Diaphragms and small bowel /mesentary all free of disease.    04/25/2018 Pathology Results   1. Adnexa - ovary +/- tube,  neoplastic, left - HIGH GRADE CARCINOMA, SPANNING 13.3 CM. - PRESUMED OVARIAN SURFACE INVOLVEMENT. - DEFINITIVE FALLOPIAN TUBE NOT IDENTIFIED. - SEE COMMENT AND ONCOLOGY TABLE. 2. Uterus and cervix, right tube and ovary - UTERUS: -ENDOMETRIUM: INACTIVE ENDOMETRIUM. NO HYPERPLASIA OR MALIGNANCY. -MYOMETRIUM: LEIOMYOMA. NO MALIGNANCY. -SEROSA: UNREMARKABLE. NO MALIGNANCY. - CERVIX: BENIGN SQUAMOUS AND ENDOCERVICAL MUCOSA. NO DYSPLASIA OR MALIGNANCY. - RIGHT OVARY: UNREMARKABLE. NO MALIGNANCY. - RIGHT FALLOPIAN TUBES: METASTATIC CARCINOMA. 3. Soft tissue, biopsy, sigmoid implant - METASTATIC CARCINOMA. 4. Omentum, resection for tumor - METASTATIC CARCINOMA. 5. Soft tissue, biopsy, pelvic cecal implant - METASTATIC CARCINOMA. Microscopic Comment 1. OVARY or FALLOPIAN TUBE or PRIMARY PERITONEUM: Procedure: Left salpingo-oophorectomy. Hysterectomy and right salpingo-oophorectomy, omentectomy and implant biopsies. Specimen Integrity: Disrupted. Tumor Site: Left ovary. Ovarian Surface Involvement (required only if applicable): Presumed surface involvement, see comment. Fallopian Tube Surface Involvement (required only if applicable): Can not be determined, see comment. Tumor Size: 13.3 cm. Histologic Type: See comment. Histologic Grade: High grade. Implants (required for advanced stage serous/seromucinous borderline tumors only): Present. Other Tissue/ Organ Involvement: Right fallopian tube, sigmoid implant, omental implant, cecal implant. Largest Extrapelvic Peritoneal Focus (required only if applicable): 1.6 cm. Peritoneal/Ascitic Fluid: N/A. Treatment Effect (required only for high-grade serous carcinomas): Definitive effect not seen. Regional Lymph Nodes: No lymph nodes submitted or found. Pathologic Stage Classification (pTNM, AJCC 8th Edition): ypT3b, ypNX Representative Tumor Block: 1A-F Comment(s): Residual ovarian parenchyma is not identified and the entire mass consists of  tumor, thus, surface involvement is presumably present. The left fallopian tube is not grossly or microscopically identified and thus involvement can not be assessed. A prior omental biopsy was sent to Dr. Annitta Jersey for consultation and was diagnosed as high grade carcinoma with hepatoid features. The current tumor is compared to the biopsy and looks morphologically identical.   05/06/2018 Tumor  Marker   Patient's tumor was tested for the following markers: CA-125 Results of the tumor marker test revealed 190   06/10/2018 Tumor Marker   Patient's tumor was tested for the following markers: CA-125 Results of the tumor marker test revealed 16.4   07/05/2018 Tumor Marker   Patient's tumor was tested for the following markers: CA-125 Results of the tumor marker test revealed 13.4   08/01/2018 Imaging   1. No evidence of metastatic disease in the chest, abdomen or pelvis. 2. Interval resection of the large left adnexal mass. Haziness of the lower mesenteric and distal pericolonic fat, favor postsurgical change. This study will serve as a new baseline for future surveillance. 3. Chronic findings include: Aortic Atherosclerosis (ICD10-I70.0). Small hiatal hernia. Moderate sigmoid diverticulosis.   08/01/2018 Tumor Marker   Patient's tumor was tested for the following markers: CA-125 Results of the tumor marker test revealed 12.1   08/26/2018 Procedure   Successful right IJ vein Port-A-Cath explant.   02/10/2019 Tumor Marker   Patient's tumor was tested for the following markers: CA-125 Results of the tumor marker test revealed 49.1   02/18/2019 Imaging   1. Interval development of mildly enlarged left para-aortic lymph node measuring 1.0 cm in short axis. This is nonspecific, but the possibility of nodal metastasis warrants consideration. Close attention on follow-up studies is recommended. 2. No other signs of potential metastatic disease noted elsewhere in the abdomen or pelvis. 3. However,  there is a focal area of asymmetric mural thickening in the mid to distal sigmoid colon measuring 2.3 x 2.2 cm. The possibility of colonic neoplasm should be considered, and further evaluation with nonemergent outpatient colonoscopy is suggested in the near future to better evaluate this finding. 4. Several small infraumbilical ventral hernias containing short segments of mid to distal small bowel, without evidence of associated bowel incarceration or obstruction at this time. 5. There are calcifications of the aortic valve. Echocardiographic correlation for evaluation of potential valvular dysfunction may be warranted if clinically indicated. 6. Aortic atherosclerosis. 7. Additional incidental findings, as above.   02/28/2019 Procedure   Successful placement of a right IJ approach Power Port with ultrasound and fluoroscopic guidance. The catheter is ready for use.     03/05/2019 - 05/27/2019 Chemotherapy   The patient had carboplatin and Doxil for chemotherapy treatment.     04/02/2019 Tumor Marker   Patient's tumor was tested for the following markers: CA-125 Results of the tumor marker test revealed 52.6.   04/30/2019 Tumor Marker   Patient's tumor was tested for the following markers: CA-125 Results of the tumor marker test revealed 65.7   05/20/2019 Imaging   1. Serosal metastasis along the sigmoid colon is stable from 02/19/2019. Nodules in the sigmoid mesentery are new from 08/01/2018 and indicative of metastatic disease. 2. Left periaortic lymph node is no longer pathologically enlarged. 3. Multiple midline ventral hernias, described above. 4. Aortic atherosclerosis (ICD10-170.0). Coronary artery calcification.   05/30/2019 -  Chemotherapy   The patient had gemcitabine (GEMZAR) 1,520 mg in sodium chloride 0.9 % 250 mL chemo infusion, 800 mg/m2 = 1,520 mg (80 % of original dose 1,000 mg/m2), Intravenous,  Once, 1 of 4 cycles Dose modification: 800 mg/m2 (80 % of original dose 1,000  mg/m2, Cycle 1, Reason: Patient Age)  for chemotherapy treatment.      REVIEW OF SYSTEMS:   Constitutional: Denies fevers, chills or abnormal weight loss Eyes: Denies blurriness of vision Ears, nose, mouth, throat, and face: Denies mucositis or sore  throat Respiratory: Denies cough, dyspnea or wheezes Cardiovascular: Denies palpitation, chest discomfort or lower extremity swelling Skin: Denies abnormal skin rashes Lymphatics: Denies new lymphadenopathy or easy bruising Neurological:Denies numbness, tingling or new weaknesses Behavioral/Psych: Mood is stable, no new changes  All other systems were reviewed with the patient and are negative.  I have reviewed the past medical history, past surgical history, social history and family history with the patient and they are unchanged from previous note.  ALLERGIES:  has No Known Allergies.  MEDICATIONS:  Current Outpatient Medications  Medication Sig Dispense Refill  . acetaminophen (TYLENOL) 500 MG tablet Take 500 mg by mouth every 8 (eight) hours as needed for mild pain or headache.     Marland Kitchen amLODipine (NORVASC) 5 MG tablet Take 5 mg by mouth at bedtime.     . fluticasone (FLONASE) 50 MCG/ACT nasal spray Place 2 sprays into both nostrils 2 (two) times daily. (Patient taking differently: Place 2 sprays into both nostrils as needed. ) 16 g 2  . levothyroxine (SYNTHROID, LEVOTHROID) 88 MCG tablet Take 88 mcg by mouth daily before breakfast.    . lidocaine-prilocaine (EMLA) cream Apply to affected area once 30 g 3  . loratadine (CLARITIN) 10 MG tablet Take 10 mg by mouth daily as needed for allergies.     . metoprolol succinate (TOPROL-XL) 50 MG 24 hr tablet Take 50 mg by mouth every morning.     . Multiple Vitamins-Minerals (MULTIVITAMINS THER. W/MINERALS) TABS Take 1 tablet by mouth daily.      . naphazoline-pheniramine (NAPHCON-A) 0.025-0.3 % ophthalmic solution Place 1 drop into both eyes daily as needed for eye irritation.    Marland Kitchen olmesartan  (BENICAR) 40 MG tablet Take 40 mg by mouth at bedtime.     . ondansetron (ZOFRAN) 8 MG tablet Take 1 tablet (8 mg total) by mouth every 8 (eight) hours as needed. 30 tablet 1  . ondansetron (ZOFRAN-ODT) 8 MG disintegrating tablet Take 1 tablet (8 mg total) by mouth every 8 (eight) hours as needed for nausea or vomiting. 20 tablet 0  . prochlorperazine (COMPAZINE) 10 MG tablet Take 1 tablet (10 mg total) by mouth every 6 (six) hours as needed (Nausea or vomiting). 30 tablet 1  . traMADol (ULTRAM) 50 MG tablet Take 1 tablet (50 mg total) by mouth every 6 (six) hours as needed. 30 tablet 0   No current facility-administered medications for this visit.   Facility-Administered Medications Ordered in Other Visits  Medication Dose Route Frequency Provider Last Rate Last Admin  . gemcitabine (GEMZAR) 1,520 mg in sodium chloride 0.9 % 250 mL chemo infusion  800 mg/m2 (Order-Specific) Intravenous Once Alvy Bimler, Rayanne Padmanabhan, MD      . heparin lock flush 100 unit/mL  500 Units Intracatheter Once PRN Alvy Bimler, Jeanpierre Thebeau, MD      . sodium chloride flush (NS) 0.9 % injection 10 mL  10 mL Intracatheter PRN Alvy Bimler, Keyon Liller, MD        PHYSICAL EXAMINATION: ECOG PERFORMANCE STATUS: 1 - Symptomatic but completely ambulatory  Vitals:   05/30/19 1237  BP: (!) 178/81  Pulse: 72  Resp: 18  Temp: 97.9 F (36.6 C)  SpO2: 97%   Filed Weights   05/30/19 1237  Weight: 173 lb 3.2 oz (78.6 kg)    GENERAL:alert, no distress and comfortable SKIN: skin color, texture, turgor are normal, no rashes or significant lesions EYES: normal, Conjunctiva are pink and non-injected, sclera clear OROPHARYNX:no exudate, no erythema and lips, buccal mucosa, and tongue normal  NECK: supple, thyroid normal size, non-tender, without nodularity LYMPH:  no palpable lymphadenopathy in the cervical, axillary or inguinal LUNGS: clear to auscultation and percussion with normal breathing effort HEART: regular rate & rhythm and no murmurs and no lower  extremity edema ABDOMEN:abdomen soft, mild tenderness on the right lower quadrant without rebound or guarding Musculoskeletal:no cyanosis of digits and no clubbing  NEURO: alert & oriented x 3 with fluent speech, no focal motor/sensory deficits  LABORATORY DATA:  I have reviewed the data as listed    Component Value Date/Time   NA 138 05/30/2019 1202   K 4.1 05/30/2019 1202   CL 103 05/30/2019 1202   CO2 27 05/30/2019 1202   GLUCOSE 113 (H) 05/30/2019 1202   BUN 14 05/30/2019 1202   CREATININE 0.88 05/30/2019 1202   CALCIUM 9.3 05/30/2019 1202   PROT 7.1 05/30/2019 1202   ALBUMIN 3.9 05/30/2019 1202   AST 20 05/30/2019 1202   ALT 14 05/30/2019 1202   ALKPHOS 84 05/30/2019 1202   BILITOT 0.4 05/30/2019 1202   GFRNONAA 60 (L) 05/30/2019 1202   GFRAA >60 05/30/2019 1202    No results found for: SPEP, UPEP  Lab Results  Component Value Date   WBC 7.0 05/30/2019   NEUTROABS 4.2 05/30/2019   HGB 11.8 (L) 05/30/2019   HCT 34.6 (L) 05/30/2019   MCV 93.5 05/30/2019   PLT 294 05/30/2019      Chemistry      Component Value Date/Time   NA 138 05/30/2019 1202   K 4.1 05/30/2019 1202   CL 103 05/30/2019 1202   CO2 27 05/30/2019 1202   BUN 14 05/30/2019 1202   CREATININE 0.88 05/30/2019 1202      Component Value Date/Time   CALCIUM 9.3 05/30/2019 1202   ALKPHOS 84 05/30/2019 1202   AST 20 05/30/2019 1202   ALT 14 05/30/2019 1202   BILITOT 0.4 05/30/2019 1202       RADIOGRAPHIC STUDIES: I have personally reviewed the radiological images as listed and agreed with the findings in the report. CT Abdomen Pelvis W Contrast  Result Date: 05/21/2019 CLINICAL DATA:  Ovarian cancer, on chemotherapy. Left lower quadrant and left hip pain. EXAM: CT ABDOMEN AND PELVIS WITH CONTRAST TECHNIQUE: Multidetector CT imaging of the abdomen and pelvis was performed using the standard protocol following bolus administration of intravenous contrast. CONTRAST:  197m OMNIPAQUE IOHEXOL 300  MG/ML  SOLN COMPARISON:  02/19/2019 and 08/01/2018. FINDINGS: Lower chest: Subpleural nodules in the left lower lobe measure 2 mm or less in size, nonspecific. Heart is enlarged. Coronary artery calcification. No pericardial or pleural effusion. Hepatobiliary: Millimetric low-attenuation lesion in the periphery of the right hepatic lobe is too small to characterize. Liver and gallbladder are otherwise unremarkable. No biliary ductal dilatation. Pancreas: Negative. Spleen: Negative. Adrenals/Urinary Tract: Adrenal glands are unremarkable. Subcentimeter low-attenuation lesions in both kidneys are too small to characterize. Kidneys are otherwise unremarkable. Ureters are decompressed. Bladder is partially obscured by streak artifact from a right hip arthroplasty. Stomach/Bowel: Small hiatal hernia. Stomach is decompressed. There are small midline ventral hernias containing knuckles of small bowel or colon. An infraumbilical midline pelvic wall hernia contains a short segment of unobstructed small bowel. Small bowel and majority of the colon are otherwise unremarkable. There is a serosal implant along the sigmoid colon, measuring 1.8 x 2.7 cm (2/60), similar when remeasured on the prior exam. Vascular/Lymphatic: Atherosclerotic calcification of the aorta without aneurysm. No pathologically enlarged lymph nodes. Specifically, a left periaortic lymph node  has decreased in size to 5 mm, previously 10 mm on 02/19/2019. Reproductive: Hysterectomy. Right adnexa is largely obscured by streak artifact from a right hip arthroplasty. Other: There are 2 soft tissue nodules in the sigmoid mesentery, measuring 1.5 cm and 1.2 cm, respectively (2/63). Comparison with 02/18/2026 is difficult due to streak artifact. Lesions appear new from 08/01/2018. No free fluid. Musculoskeletal: Right hip arthroplasty. Degenerative changes in the spine. No worrisome lytic or sclerotic lesions. IMPRESSION: 1. Serosal metastasis along the sigmoid  colon is stable from 02/19/2019. Nodules in the sigmoid mesentery are new from 08/01/2018 and indicative of metastatic disease. 2. Left periaortic lymph node is no longer pathologically enlarged. 3. Multiple midline ventral hernias, described above. 4. Aortic atherosclerosis (ICD10-170.0). Coronary artery calcification. Electronically Signed   By: Lorin Picket M.D.   On: 05/21/2019 10:36    All questions were answered. The patient knows to call the clinic with any problems, questions or concerns. No barriers to learning was detected.  I spent 25 minutes counseling the patient face to face. The total time spent in the appointment was 30 minutes and more than 50% was on counseling and review of test results  [name], MD 05/30/2019 1:39 PM

## 2019-05-30 NOTE — Telephone Encounter (Signed)
Scheduled appt per 12/11 sch message - pt to get an updated schedule next visit.

## 2019-05-30 NOTE — Assessment & Plan Note (Signed)
We had numerous goals of care discussions in the past She understood the goals of treatment is strictly palliative in nature

## 2019-05-31 LAB — CA 125: Cancer Antigen (CA) 125: 58.6 U/mL — ABNORMAL HIGH (ref 0.0–38.1)

## 2019-06-02 ENCOUNTER — Telehealth: Payer: Self-pay | Admitting: *Deleted

## 2019-06-02 NOTE — Telephone Encounter (Signed)
Called pt to see how she did with her treatment last Friday.  She reports this treatment was the worst that she has had.  "It was horrible"  She report pain in her abdomen, "not excruciating but pain like what you might have when you have your period."  She states she is better today.  She also had pain in her back & L hip on Friday.  She does not like to take pain meds & Dr Alvy Bimler gave her tramadol.  She states she just took 1/2 & felt like it effected her head.  We discussed tylenol which she says she is taking.  She denies any other problems  & knows how to reach [place].

## 2019-06-02 NOTE — Telephone Encounter (Signed)
-----   Message from Scot Dock, RN sent at 05/30/2019  2:41 PM EST ----- Regarding: 1st time Dr. Alvy Bimler follow up chemo - tolerated well

## 2019-06-03 ENCOUNTER — Encounter: Payer: Self-pay | Admitting: Hematology and Oncology

## 2019-06-06 ENCOUNTER — Other Ambulatory Visit: Payer: Self-pay | Admitting: Hematology and Oncology

## 2019-06-06 ENCOUNTER — Telehealth: Payer: Self-pay | Admitting: Oncology

## 2019-06-06 ENCOUNTER — Other Ambulatory Visit: Payer: Self-pay

## 2019-06-06 ENCOUNTER — Inpatient Hospital Stay: Payer: Medicare Other

## 2019-06-06 ENCOUNTER — Telehealth: Payer: Self-pay

## 2019-06-06 VITALS — BP 180/98 | HR 84 | Temp 98.3°F | Resp 16

## 2019-06-06 DIAGNOSIS — Z7189 Other specified counseling: Secondary | ICD-10-CM

## 2019-06-06 DIAGNOSIS — C562 Malignant neoplasm of left ovary: Secondary | ICD-10-CM

## 2019-06-06 DIAGNOSIS — Z5111 Encounter for antineoplastic chemotherapy: Secondary | ICD-10-CM | POA: Diagnosis not present

## 2019-06-06 LAB — CMP (CANCER CENTER ONLY)
ALT: 75 U/L — ABNORMAL HIGH (ref 0–44)
AST: 46 U/L — ABNORMAL HIGH (ref 15–41)
Albumin: 4 g/dL (ref 3.5–5.0)
Alkaline Phosphatase: 102 U/L (ref 38–126)
Anion gap: 11 (ref 5–15)
BUN: 21 mg/dL (ref 8–23)
CO2: 25 mmol/L (ref 22–32)
Calcium: 9.8 mg/dL (ref 8.9–10.3)
Chloride: 101 mmol/L (ref 98–111)
Creatinine: 0.79 mg/dL (ref 0.44–1.00)
GFR, Est AFR Am: >60 mL/min (ref >60)
GFR, Estimated: >60 mL/min (ref >60)
Glucose, Bld: 129 mg/dL — ABNORMAL HIGH (ref 70–99)
Potassium: 4.2 mmol/L (ref 3.5–5.1)
Sodium: 137 mmol/L (ref 135–145)
Total Bilirubin: 0.3 mg/dL (ref 0.3–1.2)
Total Protein: 7.4 g/dL (ref 6.5–8.1)

## 2019-06-06 LAB — CBC WITH DIFFERENTIAL (CANCER CENTER ONLY)
Abs Immature Granulocytes: 0.13 10*3/uL — ABNORMAL HIGH (ref 0.00–0.07)
Basophils Absolute: 0 10*3/uL (ref 0.0–0.1)
Basophils Relative: 1 %
Eosinophils Absolute: 0 10*3/uL (ref 0.0–0.5)
Eosinophils Relative: 1 %
HCT: 33 % — ABNORMAL LOW (ref 36.0–46.0)
Hemoglobin: 11.2 g/dL — ABNORMAL LOW (ref 12.0–15.0)
Immature Granulocytes: 3 %
Lymphocytes Relative: 43 %
Lymphs Abs: 1.7 10*3/uL (ref 0.7–4.0)
MCH: 32.4 pg (ref 26.0–34.0)
MCHC: 33.9 g/dL (ref 30.0–36.0)
MCV: 95.4 fL (ref 80.0–100.0)
Monocytes Absolute: 0.6 10*3/uL (ref 0.1–1.0)
Monocytes Relative: 14 %
Neutro Abs: 1.5 10*3/uL — ABNORMAL LOW (ref 1.7–7.7)
Neutrophils Relative %: 38 %
Platelet Count: 235 10*3/uL (ref 150–400)
RBC: 3.46 MIL/uL — ABNORMAL LOW (ref 3.87–5.11)
RDW: 16.2 % — ABNORMAL HIGH (ref 11.5–15.5)
WBC Count: 3.9 10*3/uL — ABNORMAL LOW (ref 4.0–10.5)
nRBC: 0 % (ref 0.0–0.2)

## 2019-06-06 MED ORDER — SODIUM CHLORIDE 0.9 % IV SOLN
800.0000 mg/m2 | Freq: Once | INTRAVENOUS | Status: AC
  Administered 2019-06-06: 1520 mg via INTRAVENOUS
  Filled 2019-06-06: qty 39.98

## 2019-06-06 MED ORDER — ALTEPLASE 2 MG IJ SOLR
INTRAMUSCULAR | Status: AC
  Filled 2019-06-06: qty 2

## 2019-06-06 MED ORDER — SODIUM CHLORIDE 0.9 % IV SOLN
Freq: Once | INTRAVENOUS | Status: AC
  Filled 2019-06-06: qty 250

## 2019-06-06 MED ORDER — PROCHLORPERAZINE MALEATE 10 MG PO TABS
10.0000 mg | ORAL_TABLET | Freq: Once | ORAL | Status: AC
  Administered 2019-06-06: 10 mg via ORAL

## 2019-06-06 MED ORDER — HEPARIN SOD (PORK) LOCK FLUSH 100 UNIT/ML IV SOLN
500.0000 [IU] | Freq: Once | INTRAVENOUS | Status: AC | PRN
  Administered 2019-06-06: 500 [IU]
  Filled 2019-06-06: qty 5

## 2019-06-06 MED ORDER — ALTEPLASE 2 MG IJ SOLR
2.0000 mg | Freq: Once | INTRAMUSCULAR | Status: DC
  Filled 2019-06-06: qty 2

## 2019-06-06 MED ORDER — PROCHLORPERAZINE MALEATE 10 MG PO TABS
ORAL_TABLET | ORAL | Status: AC
  Filled 2019-06-06: qty 1

## 2019-06-06 MED ORDER — SODIUM CHLORIDE 0.9% FLUSH
10.0000 mL | Freq: Once | INTRAVENOUS | Status: AC
  Administered 2019-06-06: 10 mL
  Filled 2019-06-06: qty 10

## 2019-06-06 MED ORDER — SODIUM CHLORIDE 0.9% FLUSH
10.0000 mL | INTRAVENOUS | Status: DC | PRN
  Administered 2019-06-06: 10 mL
  Filled 2019-06-06: qty 10

## 2019-06-06 NOTE — Telephone Encounter (Signed)
Called daughter per Dr. Alvy Bimler and told her hemoglobin is stable. This is not the reason for the fatigue. She verbalized understanding.

## 2019-06-06 NOTE — Patient Instructions (Signed)

## 2019-06-06 NOTE — Patient Instructions (Signed)
[facility] Discharge Instructions for Patients Receiving Chemotherapy  Today you received the following chemotherapy agents Gemcitabine  To help prevent nausea and vomiting after your treatment, we encourage you to take your nausea medication as directed.   If you develop nausea and vomiting that is not controlled by your nausea medication, call the clinic.   BELOW ARE SYMPTOMS THAT SHOULD BE REPORTED IMMEDIATELY:  *FEVER GREATER THAN 100.5 F  *CHILLS WITH OR WITHOUT FEVER  NAUSEA AND VOMITING THAT IS NOT CONTROLLED WITH YOUR NAUSEA MEDICATION  *UNUSUAL SHORTNESS OF BREATH  *UNUSUAL BRUISING OR BLEEDING  TENDERNESS IN MOUTH AND THROAT WITH OR WITHOUT PRESENCE OF ULCERS  *URINARY PROBLEMS  *BOWEL PROBLEMS  UNUSUAL RASH Items with * indicate a potential emergency and should be followed up as soon as possible.  Feel free to call the clinic should you have any questions or concerns. The clinic phone number is [phone].  Please show the CHEMO ALERT CARD at check-in to the Emergency Department and triage nurse.  Gemcitabine injection What is this medicine? GEMCITABINE (jem SYE ta been) is a chemotherapy drug. This medicine is used to treat many types of cancer like breast cancer, lung cancer, pancreatic cancer, and ovarian cancer. This medicine may be used for other purposes; ask your health care provider or pharmacist if you have questions. COMMON BRAND NAME(S): Gemzar, Infugem What should I tell my health care provider before I take this medicine? They need to know if you have any of these conditions:  blood disorders  infection  kidney disease  liver disease  lung or breathing disease, like asthma  recent or ongoing radiation therapy  an unusual or allergic reaction to gemcitabine, other chemotherapy, other medicines, foods, dyes, or preservatives  pregnant or trying to get pregnant  breast-feeding How should I use this medicine? This drug  is given as an infusion into a vein. It is administered in a hospital or clinic by a specially trained health care professional. Talk to your pediatrician regarding the use of this medicine in children. Special care may be needed. Overdosage: If you think you have taken too much of this medicine contact a poison control center or emergency room at once. NOTE: This medicine is only for you. Do not share this medicine with others. What if I miss a dose? It is important not to miss your dose. Call your doctor or health care professional if you are unable to keep an appointment. What may interact with this medicine?  medicines to increase blood counts like filgrastim, pegfilgrastim, sargramostim  some other chemotherapy drugs like cisplatin  vaccines Talk to your doctor or health care professional before taking any of these medicines:  acetaminophen  aspirin  ibuprofen  ketoprofen  naproxen This list may not describe all possible interactions. Give your health care provider a list of all the medicines, herbs, non-prescription drugs, or dietary supplements you use. Also tell them if you smoke, drink alcohol, or use illegal drugs. Some items may interact with your medicine. What should I watch for while using this medicine? Visit your doctor for checks on your progress. This drug may make you feel generally unwell. This is not uncommon, as chemotherapy can affect healthy cells as well as cancer cells. Report any side effects. Continue your course of treatment even though you feel ill unless your doctor tells you to stop. In some cases, you may be given additional medicines to help with side effects. Follow all directions for their use. Call   your doctor or health care professional for advice if you get a fever, chills or sore throat, or other symptoms of a cold or flu. Do not treat yourself. This drug decreases your body's ability to fight infections. Try to avoid being around people who are  sick. This medicine may increase your risk to bruise or bleed. Call your doctor or health care professional if you notice any unusual bleeding. Be careful brushing and flossing your teeth or using a toothpick because you may get an infection or bleed more easily. If you have any dental work done, tell your dentist you are receiving this medicine. Avoid taking products that contain aspirin, acetaminophen, ibuprofen, naproxen, or ketoprofen unless instructed by your doctor. These medicines may hide a fever. Do not become pregnant while taking this medicine or for 6 months after stopping it. Women should inform their doctor if they wish to become pregnant or think they might be pregnant. Men should not father a child while taking this medicine and for 3 months after stopping it. There is a potential for serious side effects to an unborn child. Talk to your health care professional or pharmacist for more information. Do not breast-feed an infant while taking this medicine or for at least 1 week after stopping it. Men should inform their doctors if they wish to father a child. This medicine may lower sperm counts. Talk with your doctor or health care professional if you are concerned about your fertility. What side effects may I notice from receiving this medicine? Side effects that you should report to your doctor or health care professional as soon as possible:  allergic reactions like skin rash, itching or hives, swelling of the face, lips, or tongue  breathing problems  pain, redness, or irritation at site where injected  signs and symptoms of a dangerous change in heartbeat or heart rhythm like chest pain; dizziness; fast or irregular heartbeat; palpitations; feeling faint or lightheaded, falls; breathing problems  signs of decreased platelets or bleeding - bruising, pinpoint red spots on the skin, black, tarry stools, blood in the urine  signs of decreased red blood cells - unusually weak or  tired, feeling faint or lightheaded, falls  signs of infection - fever or chills, cough, sore throat, pain or difficulty passing urine  signs and symptoms of kidney injury like trouble passing urine or change in the amount of urine  signs and symptoms of liver injury like dark yellow or brown urine; general ill feeling or flu-like symptoms; light-colored stools; loss of appetite; nausea; right upper belly pain; unusually weak or tired; yellowing of the eyes or skin  swelling of ankles, feet, hands Side effects that usually do not require medical attention (report to your doctor or health care professional if they continue or are bothersome):  constipation  diarrhea  hair loss  loss of appetite  nausea  rash  vomiting This list may not describe all possible side effects. Call your doctor for medical advice about side effects. You may report side effects to FDA at 1-800-FDA-1088. Where should I keep my medicine? This drug is given in a hospital or clinic and will not be stored at home. NOTE: This sheet is a summary. It may not cover all possible information. If you have questions about this medicine, talk to your doctor, pharmacist, or health care provider.  2020 [employer] Standard (2017-08-29 18:06:11)  

## 2019-06-06 NOTE — Telephone Encounter (Signed)
Called [name] back and advised her of message from Dr. Alvy Bimler.  She verbalized agreement.

## 2019-06-06 NOTE — Telephone Encounter (Signed)
Patient's daughter, [name], called and said her mom has been very tired since her last chemo infusion.  She remembers that her mom received dexamethasone for her past chemotherapy which helped her feel better.  She is wondering if it would help her with this chemotherapy.  She also said her mom is starting to feel better and is coming for her infusion today.

## 2019-06-06 NOTE — Telephone Encounter (Signed)
I typically do not prescribe dexamethasone for that purpose It was used in the past strictly to avoid allergic reaction

## 2019-06-23 ENCOUNTER — Inpatient Hospital Stay: Payer: Medicare Other

## 2019-06-23 ENCOUNTER — Telehealth: Payer: Self-pay | Admitting: Hematology and Oncology

## 2019-06-23 ENCOUNTER — Encounter: Payer: Self-pay | Admitting: Hematology and Oncology

## 2019-06-23 ENCOUNTER — Other Ambulatory Visit: Payer: Self-pay

## 2019-06-23 ENCOUNTER — Inpatient Hospital Stay: Payer: Medicare Other | Attending: Hematology and Oncology

## 2019-06-23 ENCOUNTER — Inpatient Hospital Stay (HOSPITAL_BASED_OUTPATIENT_CLINIC_OR_DEPARTMENT_OTHER): Payer: Medicare Other | Admitting: Hematology and Oncology

## 2019-06-23 DIAGNOSIS — C562 Malignant neoplasm of left ovary: Secondary | ICD-10-CM

## 2019-06-23 DIAGNOSIS — T451X5A Adverse effect of antineoplastic and immunosuppressive drugs, initial encounter: Secondary | ICD-10-CM | POA: Insufficient documentation

## 2019-06-23 DIAGNOSIS — C786 Secondary malignant neoplasm of retroperitoneum and peritoneum: Secondary | ICD-10-CM | POA: Diagnosis not present

## 2019-06-23 DIAGNOSIS — Z5111 Encounter for antineoplastic chemotherapy: Secondary | ICD-10-CM | POA: Insufficient documentation

## 2019-06-23 DIAGNOSIS — D6481 Anemia due to antineoplastic chemotherapy: Secondary | ICD-10-CM

## 2019-06-23 DIAGNOSIS — K5909 Other constipation: Secondary | ICD-10-CM

## 2019-06-23 DIAGNOSIS — Z7189 Other specified counseling: Secondary | ICD-10-CM

## 2019-06-23 DIAGNOSIS — Z79899 Other long term (current) drug therapy: Secondary | ICD-10-CM | POA: Diagnosis not present

## 2019-06-23 DIAGNOSIS — R5383 Other fatigue: Secondary | ICD-10-CM | POA: Diagnosis not present

## 2019-06-23 LAB — CBC WITH DIFFERENTIAL (CANCER CENTER ONLY)
Abs Immature Granulocytes: 0.03 10*3/uL (ref 0.00–0.07)
Basophils Absolute: 0.1 10*3/uL (ref 0.0–0.1)
Basophils Relative: 1 %
Eosinophils Absolute: 0.1 10*3/uL (ref 0.0–0.5)
Eosinophils Relative: 2 %
HCT: 35 % — ABNORMAL LOW (ref 36.0–46.0)
Hemoglobin: 11.3 g/dL — ABNORMAL LOW (ref 12.0–15.0)
Immature Granulocytes: 0 %
Lymphocytes Relative: 20 %
Lymphs Abs: 1.4 10*3/uL (ref 0.7–4.0)
MCH: 31.9 pg (ref 26.0–34.0)
MCHC: 32.3 g/dL (ref 30.0–36.0)
MCV: 98.9 fL (ref 80.0–100.0)
Monocytes Absolute: 0.8 10*3/uL (ref 0.1–1.0)
Monocytes Relative: 12 %
Neutro Abs: 4.5 10*3/uL (ref 1.7–7.7)
Neutrophils Relative %: 65 %
Platelet Count: 477 10*3/uL — ABNORMAL HIGH (ref 150–400)
RBC: 3.54 MIL/uL — ABNORMAL LOW (ref 3.87–5.11)
RDW: 15.4 % (ref 11.5–15.5)
WBC Count: 6.9 10*3/uL (ref 4.0–10.5)
nRBC: 0 % (ref 0.0–0.2)

## 2019-06-23 LAB — CMP (CANCER CENTER ONLY)
ALT: 24 U/L (ref 0–44)
AST: 23 U/L (ref 15–41)
Albumin: 4 g/dL (ref 3.5–5.0)
Alkaline Phosphatase: 82 U/L (ref 38–126)
Anion gap: 7 (ref 5–15)
BUN: 15 mg/dL (ref 8–23)
CO2: 26 mmol/L (ref 22–32)
Calcium: 9.7 mg/dL (ref 8.9–10.3)
Chloride: 105 mmol/L (ref 98–111)
Creatinine: 0.67 mg/dL (ref 0.44–1.00)
GFR, Est AFR Am: >60 mL/min (ref >60)
GFR, Estimated: >60 mL/min (ref >60)
Glucose, Bld: 96 mg/dL (ref 70–99)
Potassium: 4.1 mmol/L (ref 3.5–5.1)
Sodium: 138 mmol/L (ref 135–145)
Total Bilirubin: 0.3 mg/dL (ref 0.3–1.2)
Total Protein: 7.1 g/dL (ref 6.5–8.1)

## 2019-06-23 MED ORDER — HEPARIN SOD (PORK) LOCK FLUSH 100 UNIT/ML IV SOLN
500.0000 [IU] | Freq: Once | INTRAVENOUS | Status: AC | PRN
  Administered 2019-06-23: 500 [IU]
  Filled 2019-06-23: qty 5

## 2019-06-23 MED ORDER — SODIUM CHLORIDE 0.9% FLUSH
10.0000 mL | INTRAVENOUS | Status: DC | PRN
  Administered 2019-06-23: 10 mL
  Filled 2019-06-23: qty 10

## 2019-06-23 MED ORDER — SODIUM CHLORIDE 0.9% FLUSH
10.0000 mL | Freq: Once | INTRAVENOUS | Status: AC
  Administered 2019-06-23: 10 mL
  Filled 2019-06-23: qty 10

## 2019-06-23 MED ORDER — SODIUM CHLORIDE 0.9 % IV SOLN
800.0000 mg/m2 | Freq: Once | INTRAVENOUS | Status: AC
  Administered 2019-06-23: 1520 mg via INTRAVENOUS
  Filled 2019-06-23: qty 39.98

## 2019-06-23 MED ORDER — SODIUM CHLORIDE 0.9 % IV SOLN
Freq: Once | INTRAVENOUS | Status: AC
  Filled 2019-06-23: qty 250

## 2019-06-23 MED ORDER — PROCHLORPERAZINE MALEATE 10 MG PO TABS
10.0000 mg | ORAL_TABLET | Freq: Once | ORAL | Status: AC
  Administered 2019-06-23: 10 mg via ORAL

## 2019-06-23 MED ORDER — PROCHLORPERAZINE MALEATE 10 MG PO TABS
ORAL_TABLET | ORAL | Status: AC
  Filled 2019-06-23: qty 1

## 2019-06-23 NOTE — Assessment & Plan Note (Signed)
She has mild intermittent constipation She will continue laxatives

## 2019-06-23 NOTE — Telephone Encounter (Signed)
Scheduled appt 1/4 sch message - pt to get an updated schedule in treatment area.

## 2019-06-23 NOTE — Assessment & Plan Note (Signed)
She appears to have positive response to treatment She denies further abdominal pain Her blood counts are satisfactory and she have no side effects from treatment so far I recommend minimum 3 months of treatment before repeat imaging study, probably due at the end of next month of March We will proceed with treatment without delay

## 2019-06-23 NOTE — Progress Notes (Signed)
[place] OFFICE PROGRESS NOTE  Patient Care Team: Marton Redwood, [name]  ASSESSMENT & PLAN:  Left ovarian epithelial cancer [facility]) She appears to have positive response to treatment She denies further abdominal pain Her blood counts are satisfactory and she have no side effects from treatment so far I recommend minimum 3 months of treatment before repeat imaging study, probably due at the end of next month of March We will proceed with treatment without delay  Other constipation She has mild intermittent constipation She will continue laxatives  Anemia due to antineoplastic chemotherapy This is likely due to recent treatment. The patient denies recent history of bleeding such as epistaxis, hematuria or hematochezia. She is asymptomatic from the anemia. I will observe for now.  She does not require transfusion now. I will continue the chemotherapy at current dose without dosage adjustment.  If the anemia gets progressive worse in the future, I might have to delay her treatment or adjust the chemotherapy dose.    No orders of the defined types were placed in this encounter.   INTERVAL HISTORY: Please see below for problem oriented charting. 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 Large complex left paramidline pelvic mass likely rises from the left ovary. Together with left periaortic adenopathy and peritoneal carcinomatosis, findings are worrisome for metastatic ovarian cancer. 2. Small pelvic free fluid. 3.  Aortic atherosclerosis  (ICD10-170.0).   01/21/2018 Pathology Results   Omentum, biopsy, dominant omental nodule within the right lower abdominal quadrant - POORLY DIFFERENTIATED CARCINOMA CONSISTENT WITH GYNECOLOGIC PRIMARY. - SEE MICROSCOPIC DESCRIPTION. Microscopic Comment The tumor is characterized by sheets of tumor cells with enlarged nuclei, many containing prominent nucleoli. There are patchy areas with eosinophilic cytoplasmic inclusions. Immunohistochemistry shows positivity with cytokeratin AE1/AE3, cytokeratin 7, estrogen receptor, progesterone receptor, PAX-8, WT-1, and placental alkaline phosphatase (PLAP). The tumor is negative with alpha fetoprotein, human chorionic gonadotropin, CD117, CD10, CD30, CD56, CDX-2, CEA, cytokeratin 20, S100, SOX-10, synaptophysin, chromogranin, D2-40, GATA-3, GCDFP, Glypican 3 and Inhibin. 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 The estrogen receptor is 100% with strong staining and the progesterone receptor is 90% with strong staining.   01/21/2018 Procedure   Technically successful CT guided core needle biopsy of dominant omental nodule within the right lower abdominal quadrant, adjacent to the cecum.   01/24/2018 Cancer Staging   Staging form: Ovary, Fallopian Tube, and Primary Peritoneal Carcinoma, AJCC 8th Edition - Clinical: Stage IIIC (cT3c, cN1b, cM0) - Signed by [name], MD on 01/24/2018   01/25/2018 Tumor Marker   Patient's tumor was tested for the following markers: AFP Results of the tumor marker test revealed 4.7   01/31/2018 Surgery   Pre-operative Diagnosis: Ovarian cancer NOS  Post-operative Diagnosis: At least Stage 3 ovarian cancer   Operation: Laparoscopic omental biopsy  Surgeon: Mart Piggs, MD  Specimens: Omentum  Operative Findings: Omental nodule >2cm. Large left pelvic mass 12-15cm with sigmoid draped medially and potentially adherent to colon. The mass appeared to have a  3cm cystic area that was ruptured preoperatively. Uterus  appears normal with small anterior fibroid ~1cm. Right adnexa nonenlarged. Small bowel without evidence of disease. No diaphragmatic disease.    01/31/2018 Pathology Results   Omentum, resection for tumor - HIGH GRADE CARCINOMA. - SEE MICROSCOPIC DESCRIPTION. Microscopic Comment This case is sent to Dr. Annitta Jersey for consultation. The case was discussed with Dr. Gerarda Fraction on 02/01/18. (JDP:kh 02/01/18) The omentum shows nodules of high grade carcinoma characterized by diffuse sheets of malignant cells with enlarged nuclei with prominent nucleoli. There is moderate to abundant eosinophilic cytoplasm and frequent mitotic figures. 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 The entire specimen is examined histologically and there are nodules of tumor throughout the specimen with identical morphology. Additional immunohistochemistry for hepatoid markers will be performed and reported as an addendum   02/11/2018 Procedure   Status post right IJ port catheter placement. Catheter ready for use.   02/14/2018 Tumor Marker   Patient's tumor was tested for the following markers: CA-125 Results of the tumor marker test revealed 1523   02/15/2018 -  Chemotherapy   The patient had carboplatin and taxol. Taxol dose reduced from cycle 2 onwards   02/15/2018 - 07/05/2018 Chemotherapy   The patient had carboplatin and taxol x 6   02/23/2018 Imaging   Interval development of abdominal ascites.  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  No significant change in multilobulated complex left pelvic mass, which compresses the pelvic structures and displaces them to the  right.  Interval development of mild right hydronephrosis and hydroureter, possibly postobstructive.   03/08/2018 Tumor Marker   Patient's tumor was tested for the following markers: CA-125 Results of the tumor marker test revealed 1844   03/29/2018 Tumor Marker   Patient's tumor was tested for the following markers: CA-125 Results of the tumor marker test revealed 1141   04/10/2018 Imaging   1. New omental nodule. Otherwise, left ovarian mass and right paracolic gutter nodule have decreased in size in the interval. 2. Small ascites, increased. 3. Endometrial cavity may be dilated and contains fluid. Please correlate clinically. 4. Mild prominence of the ureters bilaterally, possibly due to large left adnexal mass. 5. Aortic atherosclerosis (ICD10-170.0). Coronary artery calcification.   04/25/2018 Surgery   Procedure(s) Performed:   1. Exploratory laparotomy  2. TAH/Bilateral salpingo-oophorectomy with radical tumor debulking for ovarian cancer . 3. Omentectomy  Surgeon: Bernita Raisin, MD Specimens: Bilateral tubes and ovaries, omentum, sigmoid nodule, cecal nodule   Operative Findings: Large left sided ovarian tumor wedged retroperitoneal and inferiorly into pelvis with some adherence to sigmoid mesentary. Some tumor nodules on surface of sigmoid epiploica that was removed. ~1-2 cm tumor implant lateral to cecum also excised. No obvious omental disease. This represented an optimal cytoreduction (R0) with no gross visible disease remaining. No carcinomatosis. Diaphragms and small bowel /mesentary all free of disease.    04/25/2018 Pathology Results   1. 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 - PRESUMED OVARIAN SURFACE INVOLVEMENT. - DEFINITIVE FALLOPIAN TUBE NOT IDENTIFIED. - SEE COMMENT AND ONCOLOGY TABLE. 2. Uterus and cervix, right tube and ovary - UTERUS: -ENDOMETRIUM: INACTIVE ENDOMETRIUM. NO HYPERPLASIA OR  MALIGNANCY. -MYOMETRIUM: LEIOMYOMA. NO MALIGNANCY. -SEROSA: UNREMARKABLE. NO MALIGNANCY. - CERVIX: BENIGN SQUAMOUS AND ENDOCERVICAL MUCOSA.  NO DYSPLASIA OR MALIGNANCY. - RIGHT OVARY: UNREMARKABLE. NO MALIGNANCY. - RIGHT FALLOPIAN TUBES: METASTATIC CARCINOMA. 3. Soft tissue, biopsy, sigmoid implant - METASTATIC CARCINOMA. 4. Omentum, resection for tumor - METASTATIC CARCINOMA. 5. Soft tissue, biopsy, pelvic cecal implant - METASTATIC CARCINOMA. Microscopic Comment 1. OVARY or FALLOPIAN TUBE or PRIMARY PERITONEUM: Procedure: Left salpingo-oophorectomy. Hysterectomy and right salpingo-oophorectomy, omentectomy and implant biopsies. Specimen Integrity: Disrupted. Tumor Site: Left ovary. Ovarian Surface Involvement (required only if applicable): Presumed surface involvement, see comment. Fallopian Tube Surface Involvement (required only if applicable): Can not be determined, see comment. Tumor Size: 13.3 cm. Histologic Type: See comment. Histologic Grade: High grade. Implants (required for advanced stage serous/seromucinous borderline tumors only): Present. Other Tissue/ Organ Involvement: Right fallopian tube, sigmoid implant, omental implant, cecal implant. Largest Extrapelvic Peritoneal Focus (required only if applicable): 1.6 cm. Peritoneal/Ascitic Fluid: N/A. Treatment Effect (required only for high-grade serous carcinomas): Definitive effect not seen. Regional Lymph Nodes: No lymph nodes submitted or found. Pathologic Stage Classification (pTNM, AJCC 8th Edition): ypT3b, ypNX Representative Tumor Block: 1A-F Comment(s): Residual ovarian parenchyma is not identified and the entire mass consists of tumor, thus, surface involvement is presumably present. The left fallopian tube is not grossly or microscopically identified and thus involvement can not be assessed. A prior omental biopsy was sent to Dr. Annitta Jersey for consultation and was diagnosed as high grade carcinoma with hepatoid  features. The current tumor is compared to the biopsy and looks morphologically identical.   05/06/2018 Tumor Marker   Patient's tumor was tested for the following markers: CA-125 Results of the tumor marker test revealed 190   06/10/2018 Tumor Marker   Patient's tumor was tested for the following markers: CA-125 Results of the tumor marker test revealed 16.4   07/05/2018 Tumor Marker   Patient's tumor was tested for the following markers: CA-125 Results of the tumor marker test revealed 13.4   08/01/2018 Imaging   1. No evidence of metastatic disease in the chest, abdomen or pelvis. 2. Interval resection of the large left adnexal mass. Haziness of the lower mesenteric and distal pericolonic fat, favor postsurgical change. This study will serve as a new baseline for future surveillance. 3. Chronic findings include: Aortic Atherosclerosis (ICD10-I70.0). Small hiatal hernia. Moderate sigmoid diverticulosis.   08/01/2018 Tumor Marker   Patient's tumor was tested for the following markers: CA-125 Results of the tumor marker test revealed 12.1   08/26/2018 Procedure   Successful right IJ vein Port-A-Cath explant.   02/10/2019 Tumor Marker   Patient's tumor was tested for the following markers: CA-125 Results of the tumor marker test revealed 49.1   02/18/2019 Imaging   1. Interval development of mildly enlarged left para-aortic lymph node measuring 1.0 cm in short axis. This is nonspecific, but the possibility of nodal metastasis warrants consideration. Close attention on follow-up studies is recommended. 2. No other signs of potential metastatic disease noted elsewhere in the abdomen or pelvis. 3. However, there is a focal area of asymmetric mural thickening in the mid to distal sigmoid colon measuring 2.3 x 2.2 cm. The possibility of colonic neoplasm should be considered, and further evaluation with nonemergent outpatient colonoscopy is suggested in the near future to better evaluate this  finding. 4. Several small infraumbilical ventral hernias containing short segments of mid to distal small bowel, without evidence of associated bowel incarceration or obstruction at this time. 5. There are calcifications of the aortic valve. Echocardiographic correlation for evaluation of potential valvular dysfunction may be warranted if clinically indicated. 6.  Aortic atherosclerosis. 7. Additional incidental findings, as above.   02/28/2019 Procedure   Successful placement of a right IJ approach Power Port with ultrasound and fluoroscopic guidance. The catheter is ready for use.     03/05/2019 - 05/27/2019 Chemotherapy   The patient had carboplatin and Doxil for chemotherapy treatment.     04/02/2019 Tumor Marker   Patient's tumor was tested for the following markers: CA-125 Results of the tumor marker test revealed 52.6.   04/30/2019 Tumor Marker   Patient's tumor was tested for the following markers: CA-125 Results of the tumor marker test revealed 65.7   05/20/2019 Imaging   1. Serosal metastasis along the sigmoid colon is stable from 02/19/2019. Nodules in the sigmoid mesentery are new from 08/01/2018 and indicative of metastatic disease. 2. Left periaortic lymph node is no longer pathologically enlarged. 3. Multiple midline ventral hernias, described above. 4. Aortic atherosclerosis (ICD10-170.0). Coronary artery calcification.   05/30/2019 -  Chemotherapy   The patient had gemcitabine for chemotherapy treatment.     05/30/2019 Tumor Marker   Patient's tumor was tested for the following markers: CA-125 Results of the tumor marker test revealed 58.6     REVIEW OF SYSTEMS:   Constitutional: Denies fevers, chills or abnormal weight loss Eyes: Denies blurriness of vision Ears, nose, mouth, throat, and face: Denies mucositis or sore throat Respiratory: Denies cough, dyspnea or wheezes Cardiovascular: Denies palpitation, chest discomfort or lower extremity swelling Skin:  Denies abnormal skin rashes Lymphatics: Denies new lymphadenopathy or easy bruising Neurological:Denies numbness, tingling or new weaknesses Behavioral/Psych: Mood is stable, no new changes  All other systems were reviewed with the patient and are negative.  I have reviewed the past medical history, past surgical history, social history and family history with the patient and they are unchanged from previous note.  ALLERGIES:  has No Known Allergies.  MEDICATIONS:  Current Outpatient Medications  Medication Sig Dispense Refill  . acetaminophen (TYLENOL) 500 MG tablet Take 500 mg by mouth every 8 (eight) hours as needed for mild pain or headache.     Marland Kitchen amLODipine (NORVASC) 5 MG tablet Take 5 mg by mouth at bedtime.     . fluticasone (FLONASE) 50 MCG/ACT nasal spray Place 2 sprays into both nostrils 2 (two) times daily. (Patient taking differently: Place 2 sprays into both nostrils as needed. ) 16 g 2  . levothyroxine (SYNTHROID, LEVOTHROID) 88 MCG tablet Take 88 mcg by mouth daily before breakfast.    . lidocaine-prilocaine (EMLA) cream Apply to affected area once 30 g 3  . loratadine (CLARITIN) 10 MG tablet Take 10 mg by mouth daily as needed for allergies.     . metoprolol succinate (TOPROL-XL) 50 MG 24 hr tablet Take 50 mg by mouth every morning.     . Multiple Vitamins-Minerals (MULTIVITAMINS THER. W/MINERALS) TABS Take 1 tablet by mouth daily.      . naphazoline-pheniramine (NAPHCON-A) 0.025-0.3 % ophthalmic solution Place 1 drop into both eyes daily as needed for eye irritation.    Marland Kitchen olmesartan (BENICAR) 40 MG tablet Take 40 mg by mouth at bedtime.     . ondansetron (ZOFRAN) 8 MG tablet Take 1 tablet (8 mg total) by mouth every 8 (eight) hours as needed. 30 tablet 1  . ondansetron (ZOFRAN-ODT) 8 MG disintegrating tablet Take 1 tablet (8 mg total) by mouth every 8 (eight) hours as needed for nausea or vomiting. 20 tablet 0  . prochlorperazine (COMPAZINE) 10 MG tablet Take 1 tablet (10 mg  total)  by mouth every 6 (six) hours as needed (Nausea or vomiting). 30 tablet 1  . traMADol (ULTRAM) 50 MG tablet Take 1 tablet (50 mg total) by mouth every 6 (six) hours as needed. 30 tablet 0   No current facility-administered medications for this visit.    PHYSICAL EXAMINATION: ECOG PERFORMANCE STATUS: 1 - Symptomatic but completely ambulatory  Vitals:   06/23/19 1211  BP: (!) 169/82  Pulse: 88  Resp: 18  Temp: 98.5 F (36.9 C)  SpO2: 100%   Filed Weights   06/23/19 1211  Weight: 174 lb 3.2 oz (79 kg)    GENERAL:alert, no distress and comfortable SKIN: skin color, texture, turgor are normal, no rashes or significant lesions EYES: normal, Conjunctiva are pink and non-injected, sclera clear OROPHARYNX:no exudate, no erythema and lips, buccal mucosa, and tongue normal  NECK: supple, thyroid normal size, non-tender, without nodularity LYMPH:  no palpable lymphadenopathy in the cervical, axillary or inguinal LUNGS: clear to auscultation and percussion with normal breathing effort HEART: regular rate & rhythm and no murmurs and no lower extremity edema ABDOMEN:abdomen soft, non-tender and normal bowel sounds Musculoskeletal:no cyanosis of digits and no clubbing  NEURO: alert & oriented x 3 with fluent speech, no focal motor/sensory deficits  LABORATORY DATA:  I have reviewed the data as listed    Component Value Date/Time   NA 137 06/06/2019 1320   K 4.2 06/06/2019 1320   CL 101 06/06/2019 1320   CO2 25 06/06/2019 1320   GLUCOSE 129 (H) 06/06/2019 1320   BUN 21 06/06/2019 1320   CREATININE 0.79 06/06/2019 1320   CALCIUM 9.8 06/06/2019 1320   PROT 7.4 06/06/2019 1320   ALBUMIN 4.0 06/06/2019 1320   AST 46 (H) 06/06/2019 1320   ALT 75 (H) 06/06/2019 1320   ALKPHOS 102 06/06/2019 1320   BILITOT 0.3 06/06/2019 1320   GFRNONAA >60 06/06/2019 1320   GFRAA >60 06/06/2019 1320    No results found for: SPEP, UPEP  Lab Results  Component Value Date   WBC 6.9 06/23/2019    NEUTROABS 4.5 06/23/2019   HGB 11.3 (L) 06/23/2019   HCT 35.0 (L) 06/23/2019   MCV 98.9 06/23/2019   PLT 477 (H) 06/23/2019      Chemistry      Component Value Date/Time   NA 137 06/06/2019 1320   K 4.2 06/06/2019 1320   CL 101 06/06/2019 1320   CO2 25 06/06/2019 1320   BUN 21 06/06/2019 1320   CREATININE 0.79 06/06/2019 1320      Component Value Date/Time   CALCIUM 9.8 06/06/2019 1320   ALKPHOS 102 06/06/2019 1320   AST 46 (H) 06/06/2019 1320   ALT 75 (H) 06/06/2019 1320   BILITOT 0.3 06/06/2019 1320      All questions were answered. The patient knows to call the clinic with any problems, questions or concerns. No barriers to learning was detected.  I spent 15 minutes counseling the patient face to face. The total time spent in the appointment was 20 minutes and more than 50% was on counseling and review of test results  [name], MD 06/23/2019 12:57 PM

## 2019-06-23 NOTE — Patient Instructions (Signed)
[facility] Discharge Instructions for Patients Receiving Chemotherapy  Today you received the following chemotherapy agents: Gemcitabine (Gemzar)  To help prevent nausea and vomiting after your treatment, we encourage you to take your nausea medication as directed.   If you develop nausea and vomiting that is not controlled by your nausea medication, call the clinic.   BELOW ARE SYMPTOMS THAT SHOULD BE REPORTED IMMEDIATELY:  *FEVER GREATER THAN 100.5 F  *CHILLS WITH OR WITHOUT FEVER  NAUSEA AND VOMITING THAT IS NOT CONTROLLED WITH YOUR NAUSEA MEDICATION  *UNUSUAL SHORTNESS OF BREATH  *UNUSUAL BRUISING OR BLEEDING  TENDERNESS IN MOUTH AND THROAT WITH OR WITHOUT PRESENCE OF ULCERS  *URINARY PROBLEMS  *BOWEL PROBLEMS  UNUSUAL RASH Items with * indicate a potential emergency and should be followed up as soon as possible.  Feel free to call the clinic should you have any questions or concerns. The clinic phone number is [phone].  Please show the CHEMO ALERT CARD at check-in to the Emergency Department and triage nurse.  Coronavirus (COVID-19) Are you at risk?  Are you at risk for the Coronavirus (COVID-19)?  To be considered HIGH RISK for Coronavirus (COVID-19), you have to meet the following criteria:  . Traveled to [place], [place], [place], [place] or [place]; or in the [place] to [place], [place], [place], or [place]; and have fever, cough, and shortness of breath within the last 2 weeks of travel OR . Been in close contact with a person diagnosed with COVID-19 within the last 2 weeks and have fever, cough, and shortness of breath . IF YOU DO NOT MEET THESE CRITERIA, YOU ARE CONSIDERED LOW RISK FOR COVID-19.  What to do if you are HIGH RISK for COVID-19?  . If you are having a medical emergency, call 911. . Seek medical care right away. Before you go to a doctor's office, urgent care or emergency department, call ahead and tell them  about your recent travel, contact with someone diagnosed with COVID-19, and your symptoms. You should receive instructions from your physician's office regarding next steps of care.  . When you arrive at healthcare provider, tell the healthcare staff immediately you have returned from visiting [place], [place], [place], [place] or [place]; or traveled in the [place] to [place], [place], [place], or [place]; in the last two weeks or you have been in close contact with a person diagnosed with COVID-19 in the last 2 weeks.   . Tell the health care staff about your symptoms: fever, cough and shortness of breath. . After you have been seen by a medical provider, you will be either: o Tested for (COVID-19) and discharged home on quarantine except to seek medical care if symptoms worsen, and asked to  - Stay home and avoid contact with others until you get your results (4-5 days)  - Avoid travel on public transportation if possible (such as bus, train, or airplane) or o Sent to the Emergency Department by EMS for evaluation, COVID-19 testing, and possible admission depending on your condition and test results.  What to do if you are LOW RISK for COVID-19?  Reduce your risk of any infection by using the same precautions used for avoiding the common cold or flu:  . Wash your hands often with soap and warm water for at least 20 seconds.  If soap and water are not readily available, use an alcohol-based hand sanitizer with at least 60% alcohol.  . If coughing or   sneezing, cover your mouth and nose by coughing or sneezing into the elbow areas of your shirt or coat, into a tissue or into your sleeve (not your hands). . Avoid shaking hands with others and consider head nods or verbal greetings only. . Avoid touching your eyes, nose, or mouth with unwashed hands.  . Avoid close contact with people who are sick. . Avoid places or events with large numbers of people in one location, like concerts or  sporting events. . Carefully consider travel plans you have or are making. . If you are planning any travel outside or inside the [place], visit the CDC's Travelers' Health webpage for the latest health notices. . If you have some symptoms but not all symptoms, continue to monitor at home and seek medical attention if your symptoms worsen. . If you are having a medical emergency, call 911.   ADDITIONAL HEALTHCARE OPTIONS FOR PATIENTS  [facility] / e-Visit: [email or web address]         [facility]: [phone]  [facility]: [phone]                   [facility]: [phone]   

## 2019-06-23 NOTE — Assessment & Plan Note (Signed)

## 2019-06-25 ENCOUNTER — Telehealth: Payer: Self-pay | Admitting: Oncology

## 2019-06-25 NOTE — Telephone Encounter (Signed)
Shannon Ellis called and asked how to get a k-pad like they use in the hospital.  Advised her that it is not available through medical supply stores but that [facility] may have something similar.

## 2019-06-30 ENCOUNTER — Other Ambulatory Visit: Payer: Self-pay

## 2019-06-30 ENCOUNTER — Inpatient Hospital Stay: Payer: Medicare Other

## 2019-06-30 VITALS — BP 171/82 | HR 65 | Temp 98.4°F | Resp 18

## 2019-06-30 DIAGNOSIS — K5909 Other constipation: Secondary | ICD-10-CM | POA: Diagnosis not present

## 2019-06-30 DIAGNOSIS — C786 Secondary malignant neoplasm of retroperitoneum and peritoneum: Secondary | ICD-10-CM | POA: Diagnosis not present

## 2019-06-30 DIAGNOSIS — C562 Malignant neoplasm of left ovary: Secondary | ICD-10-CM

## 2019-06-30 DIAGNOSIS — Z5111 Encounter for antineoplastic chemotherapy: Secondary | ICD-10-CM | POA: Diagnosis not present

## 2019-06-30 DIAGNOSIS — D6481 Anemia due to antineoplastic chemotherapy: Secondary | ICD-10-CM | POA: Diagnosis not present

## 2019-06-30 DIAGNOSIS — Z7189 Other specified counseling: Secondary | ICD-10-CM

## 2019-06-30 DIAGNOSIS — T451X5A Adverse effect of antineoplastic and immunosuppressive drugs, initial encounter: Secondary | ICD-10-CM | POA: Diagnosis not present

## 2019-06-30 LAB — CBC WITH DIFFERENTIAL (CANCER CENTER ONLY)
Abs Immature Granulocytes: 0.16 10*3/uL — ABNORMAL HIGH (ref 0.00–0.07)
Basophils Absolute: 0 10*3/uL (ref 0.0–0.1)
Basophils Relative: 1 %
Eosinophils Absolute: 0.1 10*3/uL (ref 0.0–0.5)
Eosinophils Relative: 2 %
HCT: 31.9 % — ABNORMAL LOW (ref 36.0–46.0)
Hemoglobin: 10.8 g/dL — ABNORMAL LOW (ref 12.0–15.0)
Immature Granulocytes: 4 %
Lymphocytes Relative: 45 %
Lymphs Abs: 1.6 10*3/uL (ref 0.7–4.0)
MCH: 33 pg (ref 26.0–34.0)
MCHC: 33.9 g/dL (ref 30.0–36.0)
MCV: 97.6 fL (ref 80.0–100.0)
Monocytes Absolute: 0.6 10*3/uL (ref 0.1–1.0)
Monocytes Relative: 16 %
Neutro Abs: 1.2 10*3/uL — ABNORMAL LOW (ref 1.7–7.7)
Neutrophils Relative %: 32 %
Platelet Count: 268 10*3/uL (ref 150–400)
RBC: 3.27 MIL/uL — ABNORMAL LOW (ref 3.87–5.11)
RDW: 14.4 % (ref 11.5–15.5)
WBC Count: 3.6 10*3/uL — ABNORMAL LOW (ref 4.0–10.5)
nRBC: 0 % (ref 0.0–0.2)

## 2019-06-30 LAB — CMP (CANCER CENTER ONLY)
ALT: 50 U/L — ABNORMAL HIGH (ref 0–44)
AST: 35 U/L (ref 15–41)
Albumin: 3.9 g/dL (ref 3.5–5.0)
Alkaline Phosphatase: 100 U/L (ref 38–126)
Anion gap: 9 (ref 5–15)
BUN: 13 mg/dL (ref 8–23)
CO2: 24 mmol/L (ref 22–32)
Calcium: 9.1 mg/dL (ref 8.9–10.3)
Chloride: 103 mmol/L (ref 98–111)
Creatinine: 0.78 mg/dL (ref 0.44–1.00)
GFR, Est AFR Am: >60 mL/min (ref >60)
GFR, Estimated: >60 mL/min (ref >60)
Glucose, Bld: 104 mg/dL — ABNORMAL HIGH (ref 70–99)
Potassium: 4.3 mmol/L (ref 3.5–5.1)
Sodium: 136 mmol/L (ref 135–145)
Total Bilirubin: 0.3 mg/dL (ref 0.3–1.2)
Total Protein: 7.3 g/dL (ref 6.5–8.1)

## 2019-06-30 MED ORDER — SODIUM CHLORIDE 0.9% FLUSH
10.0000 mL | Freq: Once | INTRAVENOUS | Status: AC
  Administered 2019-06-30: 10 mL
  Filled 2019-06-30: qty 10

## 2019-06-30 MED ORDER — PROCHLORPERAZINE MALEATE 10 MG PO TABS
10.0000 mg | ORAL_TABLET | Freq: Once | ORAL | Status: AC
  Administered 2019-06-30: 10 mg via ORAL

## 2019-06-30 MED ORDER — HEPARIN SOD (PORK) LOCK FLUSH 100 UNIT/ML IV SOLN
500.0000 [IU] | Freq: Once | INTRAVENOUS | Status: AC | PRN
  Administered 2019-06-30: 500 [IU]
  Filled 2019-06-30: qty 5

## 2019-06-30 MED ORDER — PROCHLORPERAZINE MALEATE 10 MG PO TABS
ORAL_TABLET | ORAL | Status: AC
  Filled 2019-06-30: qty 1

## 2019-06-30 MED ORDER — SODIUM CHLORIDE 0.9 % IV SOLN
800.0000 mg/m2 | Freq: Once | INTRAVENOUS | Status: AC
  Administered 2019-06-30: 1520 mg via INTRAVENOUS
  Filled 2019-06-30: qty 39.98

## 2019-06-30 MED ORDER — SODIUM CHLORIDE 0.9 % IV SOLN
Freq: Once | INTRAVENOUS | Status: AC
  Filled 2019-06-30: qty 250

## 2019-06-30 MED ORDER — SODIUM CHLORIDE 0.9% FLUSH
10.0000 mL | INTRAVENOUS | Status: DC | PRN
  Administered 2019-06-30: 10 mL
  Filled 2019-06-30: qty 10

## 2019-06-30 NOTE — Progress Notes (Signed)
Okay to treat with ANC 1.2, per Dr. Alvy Bimler.

## 2019-06-30 NOTE — Patient Instructions (Signed)
[facility] Discharge Instructions for Patients Receiving Chemotherapy  Today you received the following chemotherapy agents: Gemcitabine (Gemzar)  To help prevent nausea and vomiting after your treatment, we encourage you to take your nausea medication as directed.   If you develop nausea and vomiting that is not controlled by your nausea medication, call the clinic.   BELOW ARE SYMPTOMS THAT SHOULD BE REPORTED IMMEDIATELY:  *FEVER GREATER THAN 100.5 F  *CHILLS WITH OR WITHOUT FEVER  NAUSEA AND VOMITING THAT IS NOT CONTROLLED WITH YOUR NAUSEA MEDICATION  *UNUSUAL SHORTNESS OF BREATH  *UNUSUAL BRUISING OR BLEEDING  TENDERNESS IN MOUTH AND THROAT WITH OR WITHOUT PRESENCE OF ULCERS  *URINARY PROBLEMS  *BOWEL PROBLEMS  UNUSUAL RASH Items with * indicate a potential emergency and should be followed up as soon as possible.  Feel free to call the clinic should you have any questions or concerns. The clinic phone number is [phone].  Please show the CHEMO ALERT CARD at check-in to the Emergency Department and triage nurse.  Coronavirus (COVID-19) Are you at risk?  Are you at risk for the Coronavirus (COVID-19)?  To be considered HIGH RISK for Coronavirus (COVID-19), you have to meet the following criteria:  . Traveled to [place], [place], [place], [place] or [place]; or in the [place] to [place], [place], [place], or [place]; and have fever, cough, and shortness of breath within the last 2 weeks of travel OR . Been in close contact with a person diagnosed with COVID-19 within the last 2 weeks and have fever, cough, and shortness of breath . IF YOU DO NOT MEET THESE CRITERIA, YOU ARE CONSIDERED LOW RISK FOR COVID-19.  What to do if you are HIGH RISK for COVID-19?  . If you are having a medical emergency, call 911. . Seek medical care right away. Before you go to a doctor's office, urgent care or emergency department, call ahead and tell them  about your recent travel, contact with someone diagnosed with COVID-19, and your symptoms. You should receive instructions from your physician's office regarding next steps of care.  . When you arrive at healthcare provider, tell the healthcare staff immediately you have returned from visiting [place], [place], [place], [place] or [place]; or traveled in the [place] to [place], [place], [place], or [place]; in the last two weeks or you have been in close contact with a person diagnosed with COVID-19 in the last 2 weeks.   . Tell the health care staff about your symptoms: fever, cough and shortness of breath. . After you have been seen by a medical provider, you will be either: o Tested for (COVID-19) and discharged home on quarantine except to seek medical care if symptoms worsen, and asked to  - Stay home and avoid contact with others until you get your results (4-5 days)  - Avoid travel on public transportation if possible (such as bus, train, or airplane) or o Sent to the Emergency Department by EMS for evaluation, COVID-19 testing, and possible admission depending on your condition and test results.  What to do if you are LOW RISK for COVID-19?  Reduce your risk of any infection by using the same precautions used for avoiding the common cold or flu:  . Wash your hands often with soap and warm water for at least 20 seconds.  If soap and water are not readily available, use an alcohol-based hand sanitizer with at least 60% alcohol.  . If coughing or   sneezing, cover your mouth and nose by coughing or sneezing into the elbow areas of your shirt or coat, into a tissue or into your sleeve (not your hands). . Avoid shaking hands with others and consider head nods or verbal greetings only. . Avoid touching your eyes, nose, or mouth with unwashed hands.  . Avoid close contact with people who are sick. . Avoid places or events with large numbers of people in one location, like concerts or  sporting events. . Carefully consider travel plans you have or are making. . If you are planning any travel outside or inside the [place], visit the CDC's Travelers' Health webpage for the latest health notices. . If you have some symptoms but not all symptoms, continue to monitor at home and seek medical attention if your symptoms worsen. . If you are having a medical emergency, call 911.   ADDITIONAL HEALTHCARE OPTIONS FOR PATIENTS  [facility] / e-Visit: [email or web address]         [facility]: [phone]  [facility]: [phone]                   [facility]: [phone]   

## 2019-07-01 LAB — CA 125: Cancer Antigen (CA) 125: 49.5 U/mL — ABNORMAL HIGH (ref 0.0–38.1)

## 2019-07-13 ENCOUNTER — Encounter: Payer: Self-pay | Admitting: Hematology and Oncology

## 2019-07-14 ENCOUNTER — Inpatient Hospital Stay: Payer: Medicare Other

## 2019-07-14 ENCOUNTER — Inpatient Hospital Stay (HOSPITAL_BASED_OUTPATIENT_CLINIC_OR_DEPARTMENT_OTHER): Payer: Medicare Other | Admitting: Hematology and Oncology

## 2019-07-14 ENCOUNTER — Other Ambulatory Visit: Payer: Self-pay

## 2019-07-14 ENCOUNTER — Encounter: Payer: Self-pay | Admitting: Hematology and Oncology

## 2019-07-14 VITALS — BP 159/75 | HR 65 | Temp 97.8°F | Resp 18 | Ht 66.0 in | Wt 172.8 lb

## 2019-07-14 DIAGNOSIS — R1011 Right upper quadrant pain: Secondary | ICD-10-CM | POA: Diagnosis not present

## 2019-07-14 DIAGNOSIS — T451X5A Adverse effect of antineoplastic and immunosuppressive drugs, initial encounter: Secondary | ICD-10-CM | POA: Diagnosis not present

## 2019-07-14 DIAGNOSIS — C562 Malignant neoplasm of left ovary: Secondary | ICD-10-CM

## 2019-07-14 DIAGNOSIS — D6481 Anemia due to antineoplastic chemotherapy: Secondary | ICD-10-CM

## 2019-07-14 DIAGNOSIS — Z7189 Other specified counseling: Secondary | ICD-10-CM

## 2019-07-14 DIAGNOSIS — K5909 Other constipation: Secondary | ICD-10-CM | POA: Diagnosis not present

## 2019-07-14 DIAGNOSIS — C786 Secondary malignant neoplasm of retroperitoneum and peritoneum: Secondary | ICD-10-CM | POA: Diagnosis not present

## 2019-07-14 DIAGNOSIS — Z5111 Encounter for antineoplastic chemotherapy: Secondary | ICD-10-CM | POA: Diagnosis not present

## 2019-07-14 LAB — CBC WITH DIFFERENTIAL (CANCER CENTER ONLY)
Abs Immature Granulocytes: 0.05 10*3/uL (ref 0.00–0.07)
Basophils Absolute: 0 10*3/uL (ref 0.0–0.1)
Basophils Relative: 1 %
Eosinophils Absolute: 0.1 10*3/uL (ref 0.0–0.5)
Eosinophils Relative: 2 %
HCT: 34.1 % — ABNORMAL LOW (ref 36.0–46.0)
Hemoglobin: 11.4 g/dL — ABNORMAL LOW (ref 12.0–15.0)
Immature Granulocytes: 1 %
Lymphocytes Relative: 18 %
Lymphs Abs: 1.4 10*3/uL (ref 0.7–4.0)
MCH: 32.6 pg (ref 26.0–34.0)
MCHC: 33.4 g/dL (ref 30.0–36.0)
MCV: 97.4 fL (ref 80.0–100.0)
Monocytes Absolute: 0.9 10*3/uL (ref 0.1–1.0)
Monocytes Relative: 12 %
Neutro Abs: 5.1 10*3/uL (ref 1.7–7.7)
Neutrophils Relative %: 66 %
Platelet Count: 341 10*3/uL (ref 150–400)
RBC: 3.5 MIL/uL — ABNORMAL LOW (ref 3.87–5.11)
RDW: 14.4 % (ref 11.5–15.5)
WBC Count: 7.6 10*3/uL (ref 4.0–10.5)
nRBC: 0 % (ref 0.0–0.2)

## 2019-07-14 LAB — CMP (CANCER CENTER ONLY)
ALT: 27 U/L (ref 0–44)
AST: 33 U/L (ref 15–41)
Albumin: 3.8 g/dL (ref 3.5–5.0)
Alkaline Phosphatase: 88 U/L (ref 38–126)
Anion gap: 9 (ref 5–15)
BUN: 8 mg/dL (ref 8–23)
CO2: 23 mmol/L (ref 22–32)
Calcium: 9.3 mg/dL (ref 8.9–10.3)
Chloride: 106 mmol/L (ref 98–111)
Creatinine: 0.75 mg/dL (ref 0.44–1.00)
GFR, Est AFR Am: >60 mL/min (ref >60)
GFR, Estimated: >60 mL/min (ref >60)
Glucose, Bld: 100 mg/dL — ABNORMAL HIGH (ref 70–99)
Potassium: 4 mmol/L (ref 3.5–5.1)
Sodium: 138 mmol/L (ref 135–145)
Total Bilirubin: 0.4 mg/dL (ref 0.3–1.2)
Total Protein: 7.1 g/dL (ref 6.5–8.1)

## 2019-07-14 MED ORDER — PROCHLORPERAZINE MALEATE 10 MG PO TABS
ORAL_TABLET | ORAL | Status: AC
  Filled 2019-07-14: qty 1

## 2019-07-14 MED ORDER — SODIUM CHLORIDE 0.9% FLUSH
10.0000 mL | Freq: Once | INTRAVENOUS | Status: AC
  Administered 2019-07-14: 10 mL
  Filled 2019-07-14: qty 10

## 2019-07-14 MED ORDER — HEPARIN SOD (PORK) LOCK FLUSH 100 UNIT/ML IV SOLN
500.0000 [IU] | Freq: Once | INTRAVENOUS | Status: AC | PRN
  Administered 2019-07-14: 500 [IU]
  Filled 2019-07-14: qty 5

## 2019-07-14 MED ORDER — SODIUM CHLORIDE 0.9% FLUSH
10.0000 mL | INTRAVENOUS | Status: DC | PRN
  Administered 2019-07-14: 10 mL
  Filled 2019-07-14: qty 10

## 2019-07-14 MED ORDER — SODIUM CHLORIDE 0.9 % IV SOLN
Freq: Once | INTRAVENOUS | Status: AC
  Filled 2019-07-14: qty 250

## 2019-07-14 MED ORDER — SODIUM CHLORIDE 0.9 % IV SOLN
800.0000 mg/m2 | Freq: Once | INTRAVENOUS | Status: AC
  Administered 2019-07-14: 1520 mg via INTRAVENOUS
  Filled 2019-07-14: qty 39.98

## 2019-07-14 MED ORDER — PROCHLORPERAZINE MALEATE 10 MG PO TABS
10.0000 mg | ORAL_TABLET | Freq: Once | ORAL | Status: AC
  Administered 2019-07-14: 10 mg via ORAL

## 2019-07-14 NOTE — Progress Notes (Signed)
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[facility]) She appears to have positive response to treatment but it is very difficult to assess She denies further abdominal pain, however, she does have pain in the left inguinal region associated with bowel movement which is coinciding with the location of her cancer recurrence Her blood counts are satisfactory and she have no side effects from treatment so far Due to inability to assess adequately, I recommend we proceed with treatment for total 6 doses before we repeat CT imaging next month She agreed with the plan of care  Anemia due to antineoplastic chemotherapy This is likely due to recent treatment. The patient denies recent history of bleeding such as epistaxis, hematuria or hematochezia. She is asymptomatic from the anemia. I will observe for now.  She does not require transfusion now. I will continue the chemotherapy at current dose without dosage adjustment.  If the anemia gets progressive worse in the future, I might have to delay her treatment or adjust the chemotherapy dose.   Other constipation She has mild intermittent constipation She will continue laxatives  Abdominal pain The patient cannot tell me further about the abdominal pain The pain coincide at the location of her disease We will assess with CT imaging next month but will proceed with treatment as scheduled   Orders Placed This Encounter  Procedures  . CT ABDOMEN PELVIS W CONTRAST    Standing Status:   Future    Standing Expiration Date:   07/13/2020    Order Specific Question:   If indicated for the ordered procedure, I authorize the administration of contrast media per Radiology protocol    Answer:   Yes    Order Specific Question:   Preferred imaging location?    Answer:   [id number]    Order Specific Question:   Radiology  Contrast Protocol - do NOT remove file path    Answer:   [email or web address]    All questions were answered. The patient knows to call the clinic with any problems, questions or concerns. The total time spent in the appointment was 20 minutes encounter with patients including review of chart and various tests results, discussions about plan of care and coordination of care plan   [name], MD 07/14/2019 12:34 PM  INTERVAL HISTORY: Please see below for problem oriented charting. She returns with her daughter for further follow-up and chemotherapy When I inquired about abdominal pain, she continues to have pain in the left lower quadrant associated with bowel movement She has mild intermittent constipation for the first few days after chemo that usually resolved with laxatives Her pain is not severe.  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 Large complex left paramidline pelvic mass likely rises from the left ovary. Together with left periaortic adenopathy and peritoneal carcinomatosis, findings are worrisome for metastatic ovarian cancer. 2. Small pelvic free fluid. 3.  Aortic atherosclerosis (ICD10-170.0).   01/21/2018 Pathology Results   Omentum, biopsy, dominant omental nodule within the right lower abdominal quadrant - POORLY DIFFERENTIATED CARCINOMA CONSISTENT WITH GYNECOLOGIC PRIMARY. - SEE MICROSCOPIC DESCRIPTION. Microscopic Comment The tumor is characterized by sheets of  tumor cells with enlarged nuclei, many containing prominent nucleoli. There are patchy areas with eosinophilic cytoplasmic inclusions. Immunohistochemistry shows positivity with cytokeratin AE1/AE3, cytokeratin 7, estrogen receptor,  progesterone receptor, PAX-8, WT-1, and placental alkaline phosphatase (PLAP). The tumor is negative with alpha fetoprotein, human chorionic gonadotropin, CD117, CD10, CD30, CD56, CDX-2, CEA, cytokeratin 20, S100, SOX-10, synaptophysin, chromogranin, D2-40, GATA-3, GCDFP, Glypican 3 and Inhibin. 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 The estrogen receptor is 100% with strong staining and the progesterone receptor is 90% with strong staining.   01/21/2018 Procedure   Technically successful CT guided core needle biopsy of dominant omental nodule within the right lower abdominal quadrant, adjacent to the cecum.   01/24/2018 Cancer Staging   Staging form: Ovary, Fallopian Tube, and Primary Peritoneal Carcinoma, AJCC 8th Edition - Clinical: Stage IIIC (cT3c, cN1b, cM0) - Signed by [name], MD on 01/24/2018   01/25/2018 Tumor Marker   Patient's tumor was tested for the following markers: AFP Results of the tumor marker test revealed 4.7   01/31/2018 Surgery   Pre-operative Diagnosis: Ovarian cancer NOS  Post-operative Diagnosis: At least Stage 3 ovarian cancer   Operation: Laparoscopic omental biopsy  Surgeon: Mart Piggs, MD  Specimens: Omentum  Operative Findings: Omental nodule >2cm. Large left pelvic mass 12-15cm with sigmoid draped medially and potentially adherent to colon. The mass appeared to have a 3cm cystic area that was ruptured preoperatively. Uterus appears normal with small anterior fibroid ~1cm. Right adnexa nonenlarged. Small bowel without evidence of disease. No diaphragmatic disease.    01/31/2018 Pathology Results   Omentum, resection for tumor - HIGH GRADE CARCINOMA. - SEE MICROSCOPIC DESCRIPTION. Microscopic Comment This case is sent to Dr. Annitta Jersey for consultation. The case was discussed with Dr. Gerarda Fraction on 02/01/18. (JDP:kh 02/01/18) The omentum shows nodules of high grade carcinoma  characterized by diffuse sheets of malignant cells with enlarged nuclei with prominent nucleoli. There is moderate to abundant eosinophilic cytoplasm and frequent mitotic figures. 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 The entire specimen is examined histologically and there are nodules of tumor throughout the specimen with identical morphology. Additional immunohistochemistry for hepatoid markers will be performed and reported as an addendum   02/11/2018 Procedure   Status post right IJ port catheter placement. Catheter ready for use.   02/14/2018 Tumor Marker   Patient's tumor was tested for the following markers: CA-125 Results of the tumor marker test revealed 1523   02/15/2018 -  Chemotherapy   The patient had carboplatin and taxol. Taxol dose reduced from cycle 2 onwards   02/15/2018 - 07/05/2018 Chemotherapy   The patient had carboplatin and taxol x 6   02/23/2018 Imaging   Interval development of abdominal ascites.  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  No significant change in multilobulated complex left pelvic mass, which compresses the pelvic structures and displaces them to the right.  Interval development of mild right hydronephrosis and hydroureter, possibly postobstructive.   03/08/2018 Tumor Marker   Patient's tumor was tested for the following markers: CA-125 Results of the tumor marker test revealed 1844   03/29/2018 Tumor Marker   Patient's tumor was tested for the following markers: CA-125 Results of the  tumor marker test revealed 1141   04/10/2018 Imaging   1. New omental nodule. Otherwise, left ovarian mass and right paracolic gutter nodule have decreased in size in  the interval. 2. Small ascites, increased. 3. Endometrial cavity may be dilated and contains fluid. Please correlate clinically. 4. Mild prominence of the ureters bilaterally, possibly due to large left adnexal mass. 5. Aortic atherosclerosis (ICD10-170.0). Coronary artery calcification.   04/25/2018 Surgery   Procedure(s) Performed:   1. Exploratory laparotomy  2. TAH/Bilateral salpingo-oophorectomy with radical tumor debulking for ovarian cancer . 3. Omentectomy  Surgeon: Bernita Raisin, MD Specimens: Bilateral tubes and ovaries, omentum, sigmoid nodule, cecal nodule   Operative Findings: Large left sided ovarian tumor wedged retroperitoneal and inferiorly into pelvis with some adherence to sigmoid mesentary. Some tumor nodules on surface of sigmoid epiploica that was removed. ~1-2 cm tumor implant lateral to cecum also excised. No obvious omental disease. This represented an optimal cytoreduction (R0) with no gross visible disease remaining. No carcinomatosis. Diaphragms and small bowel /mesentary all free of disease.    04/25/2018 Pathology Results   1. 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 - PRESUMED OVARIAN SURFACE INVOLVEMENT. - DEFINITIVE FALLOPIAN TUBE NOT IDENTIFIED. - SEE COMMENT AND ONCOLOGY TABLE. 2. Uterus and cervix, right tube and ovary - UTERUS: -ENDOMETRIUM: INACTIVE ENDOMETRIUM. NO HYPERPLASIA OR MALIGNANCY. -MYOMETRIUM: LEIOMYOMA. NO MALIGNANCY. -SEROSA: UNREMARKABLE. NO MALIGNANCY. - CERVIX: BENIGN SQUAMOUS AND ENDOCERVICAL MUCOSA. NO DYSPLASIA OR MALIGNANCY. - RIGHT OVARY: UNREMARKABLE. NO MALIGNANCY. - RIGHT FALLOPIAN TUBES: METASTATIC CARCINOMA. 3. Soft tissue, biopsy, sigmoid implant - METASTATIC CARCINOMA. 4. Omentum, resection for tumor - METASTATIC CARCINOMA. 5. Soft tissue, biopsy, pelvic cecal implant - METASTATIC CARCINOMA. Microscopic Comment 1. OVARY or FALLOPIAN TUBE or PRIMARY  PERITONEUM: Procedure: Left salpingo-oophorectomy. Hysterectomy and right salpingo-oophorectomy, omentectomy and implant biopsies. Specimen Integrity: Disrupted. Tumor Site: Left ovary. Ovarian Surface Involvement (required only if applicable): Presumed surface involvement, see comment. Fallopian Tube Surface Involvement (required only if applicable): Can not be determined, see comment. Tumor Size: 13.3 cm. Histologic Type: See comment. Histologic Grade: High grade. Implants (required for advanced stage serous/seromucinous borderline tumors only): Present. Other Tissue/ Organ Involvement: Right fallopian tube, sigmoid implant, omental implant, cecal implant. Largest Extrapelvic Peritoneal Focus (required only if applicable): 1.6 cm. Peritoneal/Ascitic Fluid: N/A. Treatment Effect (required only for high-grade serous carcinomas): Definitive effect not seen. Regional Lymph Nodes: No lymph nodes submitted or found. Pathologic Stage Classification (pTNM, AJCC 8th Edition): ypT3b, ypNX Representative Tumor Block: 1A-F Comment(s): Residual ovarian parenchyma is not identified and the entire mass consists of tumor, thus, surface involvement is presumably present. The left fallopian tube is not grossly or microscopically identified and thus involvement can not be assessed. A prior omental biopsy was sent to Dr. Annitta Jersey for consultation and was diagnosed as high grade carcinoma with hepatoid features. The current tumor is compared to the biopsy and looks morphologically identical.   05/06/2018 Tumor Marker   Patient's tumor was tested for the following markers: CA-125 Results of the tumor marker test revealed 190   06/10/2018 Tumor Marker   Patient's tumor was tested for the following markers: CA-125 Results of the tumor marker test revealed 16.4   07/05/2018 Tumor Marker   Patient's tumor was tested for the following markers: CA-125 Results of the tumor marker test revealed 13.4    08/01/2018 Imaging   1. No evidence of metastatic disease in the chest, abdomen or pelvis. 2. Interval resection of the large  left adnexal mass. Haziness of the lower mesenteric and distal pericolonic fat, favor postsurgical change. This study will serve as a new baseline for future surveillance. 3. Chronic findings include: Aortic Atherosclerosis (ICD10-I70.0). Small hiatal hernia. Moderate sigmoid diverticulosis.   08/01/2018 Tumor Marker   Patient's tumor was tested for the following markers: CA-125 Results of the tumor marker test revealed 12.1   08/26/2018 Procedure   Successful right IJ vein Port-A-Cath explant.   02/10/2019 Tumor Marker   Patient's tumor was tested for the following markers: CA-125 Results of the tumor marker test revealed 49.1   02/18/2019 Imaging   1. Interval development of mildly enlarged left para-aortic lymph node measuring 1.0 cm in short axis. This is nonspecific, but the possibility of nodal metastasis warrants consideration. Close attention on follow-up studies is recommended. 2. No other signs of potential metastatic disease noted elsewhere in the abdomen or pelvis. 3. However, there is a focal area of asymmetric mural thickening in the mid to distal sigmoid colon measuring 2.3 x 2.2 cm. The possibility of colonic neoplasm should be considered, and further evaluation with nonemergent outpatient colonoscopy is suggested in the near future to better evaluate this finding. 4. Several small infraumbilical ventral hernias containing short segments of mid to distal small bowel, without evidence of associated bowel incarceration or obstruction at this time. 5. There are calcifications of the aortic valve. Echocardiographic correlation for evaluation of potential valvular dysfunction may be warranted if clinically indicated. 6. Aortic atherosclerosis. 7. Additional incidental findings, as above.   02/28/2019 Procedure   Successful placement of a right IJ approach Power  Port with ultrasound and fluoroscopic guidance. The catheter is ready for use.     03/05/2019 - 05/27/2019 Chemotherapy   The patient had carboplatin and Doxil for chemotherapy treatment.     04/02/2019 Tumor Marker   Patient's tumor was tested for the following markers: CA-125 Results of the tumor marker test revealed 52.6.   04/30/2019 Tumor Marker   Patient's tumor was tested for the following markers: CA-125 Results of the tumor marker test revealed 65.7   05/20/2019 Imaging   1. Serosal metastasis along the sigmoid colon is stable from 02/19/2019. Nodules in the sigmoid mesentery are new from 08/01/2018 and indicative of metastatic disease. 2. Left periaortic lymph node is no longer pathologically enlarged. 3. Multiple midline ventral hernias, described above. 4. Aortic atherosclerosis (ICD10-170.0). Coronary artery calcification.   05/30/2019 -  Chemotherapy   The patient had gemcitabine for chemotherapy treatment.     05/30/2019 Tumor Marker   Patient's tumor was tested for the following markers: CA-125 Results of the tumor marker test revealed 58.6   06/30/2019 Tumor Marker   Patient's tumor was tested for the following markers: CA-125 Results of the tumor marker test revealed 49.5.     REVIEW OF SYSTEMS:   Constitutional: Denies fevers, chills or abnormal weight loss Eyes: Denies blurriness of vision Ears, nose, mouth, throat, and face: Denies mucositis or sore throat Respiratory: Denies cough, dyspnea or wheezes Cardiovascular: Denies palpitation, chest discomfort or lower extremity swelling Skin: Denies abnormal skin rashes Lymphatics: Denies new lymphadenopathy or easy bruising Neurological:Denies numbness, tingling or new weaknesses Behavioral/Psych: Mood is stable, no new changes  All other systems were reviewed with the patient and are negative.  I have reviewed the past medical history, past surgical history, social history and family history with the  patient and they are unchanged from previous note.  ALLERGIES:  has No Known Allergies.  MEDICATIONS:  Current Outpatient  Medications  Medication Sig Dispense Refill  . acetaminophen (TYLENOL) 500 MG tablet Take 500 mg by mouth every 8 (eight) hours as needed for mild pain or headache.     Marland Kitchen amLODipine (NORVASC) 5 MG tablet Take 5 mg by mouth at bedtime.     . fluticasone (FLONASE) 50 MCG/ACT nasal spray Place 2 sprays into both nostrils 2 (two) times daily. (Patient taking differently: Place 2 sprays into both nostrils as needed. ) 16 g 2  . levothyroxine (SYNTHROID, LEVOTHROID) 88 MCG tablet Take 88 mcg by mouth daily before breakfast.    . lidocaine-prilocaine (EMLA) cream Apply to affected area once 30 g 3  . loratadine (CLARITIN) 10 MG tablet Take 10 mg by mouth daily as needed for allergies.     . metoprolol succinate (TOPROL-XL) 50 MG 24 hr tablet Take 50 mg by mouth every morning.     . Multiple Vitamins-Minerals (MULTIVITAMINS THER. W/MINERALS) TABS Take 1 tablet by mouth daily.      . naphazoline-pheniramine (NAPHCON-A) 0.025-0.3 % ophthalmic solution Place 1 drop into both eyes daily as needed for eye irritation.    Marland Kitchen olmesartan (BENICAR) 40 MG tablet Take 40 mg by mouth at bedtime.     . ondansetron (ZOFRAN) 8 MG tablet Take 1 tablet (8 mg total) by mouth every 8 (eight) hours as needed. 30 tablet 1  . ondansetron (ZOFRAN-ODT) 8 MG disintegrating tablet Take 1 tablet (8 mg total) by mouth every 8 (eight) hours as needed for nausea or vomiting. 20 tablet 0  . prochlorperazine (COMPAZINE) 10 MG tablet Take 1 tablet (10 mg total) by mouth every 6 (six) hours as needed (Nausea or vomiting). 30 tablet 1  . traMADol (ULTRAM) 50 MG tablet Take 1 tablet (50 mg total) by mouth every 6 (six) hours as needed. 30 tablet 0   No current facility-administered medications for this visit.    PHYSICAL EXAMINATION: ECOG PERFORMANCE STATUS: 1 - Symptomatic but completely ambulatory  Vitals:    07/14/19 1145  BP: (!) 159/75  Pulse: 65  Resp: 18  Temp: 97.8 F (36.6 C)  SpO2: 100%   Filed Weights   07/14/19 1145  Weight: 172 lb 12.8 oz (78.4 kg)    GENERAL:alert, no distress and comfortable SKIN: skin color, texture, turgor are normal, no rashes or significant lesions EYES: normal, Conjunctiva are pink and non-injected, sclera clear OROPHARYNX:no exudate, no erythema and lips, buccal mucosa, and tongue normal  NECK: supple, thyroid normal size, non-tender, without nodularity LYMPH:  no palpable lymphadenopathy in the cervical, axillary or inguinal LUNGS: clear to auscultation and percussion with normal breathing effort HEART: regular rate & rhythm and no murmurs and no lower extremity edema ABDOMEN:abdomen soft, non-tender and normal bowel sounds Musculoskeletal:no cyanosis of digits and no clubbing  NEURO: alert & oriented x 3 with fluent speech, no focal motor/sensory deficits  LABORATORY DATA:  I have reviewed the data as listed    Component Value Date/Time   NA 138 07/14/2019 1130   K 4.0 07/14/2019 1130   CL 106 07/14/2019 1130   CO2 23 07/14/2019 1130   GLUCOSE 100 (H) 07/14/2019 1130   BUN 8 07/14/2019 1130   CREATININE 0.75 07/14/2019 1130   CALCIUM 9.3 07/14/2019 1130   PROT 7.1 07/14/2019 1130   ALBUMIN 3.8 07/14/2019 1130   AST 33 07/14/2019 1130   ALT 27 07/14/2019 1130   ALKPHOS 88 07/14/2019 1130   BILITOT 0.4 07/14/2019 1130   GFRNONAA >60 07/14/2019 1130  GFRAA >60 07/14/2019 1130    No results found for: SPEP, UPEP  Lab Results  Component Value Date   WBC 7.6 07/14/2019   NEUTROABS 5.1 07/14/2019   HGB 11.4 (L) 07/14/2019   HCT 34.1 (L) 07/14/2019   MCV 97.4 07/14/2019   PLT 341 07/14/2019      Chemistry      Component Value Date/Time   NA 138 07/14/2019 1130   K 4.0 07/14/2019 1130   CL 106 07/14/2019 1130   CO2 23 07/14/2019 1130   BUN 8 07/14/2019 1130   CREATININE 0.75 07/14/2019 1130      Component Value Date/Time    CALCIUM 9.3 07/14/2019 1130   ALKPHOS 88 07/14/2019 1130   AST 33 07/14/2019 1130   ALT 27 07/14/2019 1130   BILITOT 0.4 07/14/2019 1130

## 2019-07-14 NOTE — Assessment & Plan Note (Signed)
She has mild intermittent constipation She will continue laxatives

## 2019-07-14 NOTE — Assessment & Plan Note (Signed)

## 2019-07-14 NOTE — Patient Instructions (Signed)
[facility] Discharge Instructions for Patients Receiving Chemotherapy  Today you received the following chemotherapy agents Gemzar  To help prevent nausea and vomiting after your treatment, we encourage you to take your nausea medication as directed.    If you develop nausea and vomiting that is not controlled by your nausea medication, call the clinic.   BELOW ARE SYMPTOMS THAT SHOULD BE REPORTED IMMEDIATELY:  *FEVER GREATER THAN 100.5 F  *CHILLS WITH OR WITHOUT FEVER  NAUSEA AND VOMITING THAT IS NOT CONTROLLED WITH YOUR NAUSEA MEDICATION  *UNUSUAL SHORTNESS OF BREATH  *UNUSUAL BRUISING OR BLEEDING  TENDERNESS IN MOUTH AND THROAT WITH OR WITHOUT PRESENCE OF ULCERS  *URINARY PROBLEMS  *BOWEL PROBLEMS  UNUSUAL RASH Items with * indicate a potential emergency and should be followed up as soon as possible.  Feel free to call the clinic you have any questions or concerns. The clinic phone number is [phone].  Please show the CHEMO ALERT CARD at check-in to the Emergency Department and triage nurse.   

## 2019-07-14 NOTE — Assessment & Plan Note (Signed)
The patient cannot tell me further about the abdominal pain The pain coincide at the location of her disease We will assess with CT imaging next month but will proceed with treatment as scheduled

## 2019-07-14 NOTE — Assessment & Plan Note (Signed)
She appears to have positive response to treatment but it is very difficult to assess She denies further abdominal pain, however, she does have pain in the left inguinal region associated with bowel movement which is coinciding with the location of her cancer recurrence Her blood counts are satisfactory and she have no side effects from treatment so far Due to inability to assess adequately, I recommend we proceed with treatment for total 6 doses before we repeat CT imaging next month She agreed with the plan of care

## 2019-07-21 ENCOUNTER — Inpatient Hospital Stay: Payer: Medicare Other

## 2019-07-21 ENCOUNTER — Inpatient Hospital Stay: Payer: Medicare Other | Attending: Hematology and Oncology

## 2019-07-21 ENCOUNTER — Other Ambulatory Visit: Payer: Self-pay

## 2019-07-21 VITALS — BP 152/78 | HR 70 | Temp 97.8°F | Resp 20 | Wt 174.0 lb

## 2019-07-21 DIAGNOSIS — Z5111 Encounter for antineoplastic chemotherapy: Secondary | ICD-10-CM | POA: Diagnosis not present

## 2019-07-21 DIAGNOSIS — Z79899 Other long term (current) drug therapy: Secondary | ICD-10-CM | POA: Diagnosis not present

## 2019-07-21 DIAGNOSIS — Z7189 Other specified counseling: Secondary | ICD-10-CM

## 2019-07-21 DIAGNOSIS — C562 Malignant neoplasm of left ovary: Secondary | ICD-10-CM | POA: Diagnosis not present

## 2019-07-21 DIAGNOSIS — G620 Drug-induced polyneuropathy: Secondary | ICD-10-CM | POA: Diagnosis not present

## 2019-07-21 DIAGNOSIS — T451X5A Adverse effect of antineoplastic and immunosuppressive drugs, initial encounter: Secondary | ICD-10-CM | POA: Diagnosis not present

## 2019-07-21 DIAGNOSIS — C786 Secondary malignant neoplasm of retroperitoneum and peritoneum: Secondary | ICD-10-CM | POA: Insufficient documentation

## 2019-07-21 LAB — CBC WITH DIFFERENTIAL (CANCER CENTER ONLY)
Abs Immature Granulocytes: 0.1 10*3/uL — ABNORMAL HIGH (ref 0.00–0.07)
Basophils Absolute: 0 10*3/uL (ref 0.0–0.1)
Basophils Relative: 1 %
Eosinophils Absolute: 0.1 10*3/uL (ref 0.0–0.5)
Eosinophils Relative: 2 %
HCT: 34.1 % — ABNORMAL LOW (ref 36.0–46.0)
Hemoglobin: 11.1 g/dL — ABNORMAL LOW (ref 12.0–15.0)
Immature Granulocytes: 2 %
Lymphocytes Relative: 39 %
Lymphs Abs: 1.6 10*3/uL (ref 0.7–4.0)
MCH: 32.1 pg (ref 26.0–34.0)
MCHC: 32.6 g/dL (ref 30.0–36.0)
MCV: 98.6 fL (ref 80.0–100.0)
Monocytes Absolute: 0.7 10*3/uL (ref 0.1–1.0)
Monocytes Relative: 17 %
Neutro Abs: 1.6 10*3/uL — ABNORMAL LOW (ref 1.7–7.7)
Neutrophils Relative %: 39 %
Platelet Count: 323 10*3/uL (ref 150–400)
RBC: 3.46 MIL/uL — ABNORMAL LOW (ref 3.87–5.11)
RDW: 13.8 % (ref 11.5–15.5)
WBC Count: 4.1 10*3/uL (ref 4.0–10.5)
nRBC: 0 % (ref 0.0–0.2)

## 2019-07-21 LAB — CMP (CANCER CENTER ONLY)
ALT: 34 U/L (ref 0–44)
AST: 30 U/L (ref 15–41)
Albumin: 3.9 g/dL (ref 3.5–5.0)
Alkaline Phosphatase: 91 U/L (ref 38–126)
Anion gap: 8 (ref 5–15)
BUN: 13 mg/dL (ref 8–23)
CO2: 27 mmol/L (ref 22–32)
Calcium: 9.5 mg/dL (ref 8.9–10.3)
Chloride: 104 mmol/L (ref 98–111)
Creatinine: 1.05 mg/dL — ABNORMAL HIGH (ref 0.44–1.00)
GFR, Est AFR Am: 56 mL/min — ABNORMAL LOW (ref >60)
GFR, Estimated: 48 mL/min — ABNORMAL LOW (ref >60)
Glucose, Bld: 98 mg/dL (ref 70–99)
Potassium: 4.5 mmol/L (ref 3.5–5.1)
Sodium: 139 mmol/L (ref 135–145)
Total Bilirubin: 0.3 mg/dL (ref 0.3–1.2)
Total Protein: 7.3 g/dL (ref 6.5–8.1)

## 2019-07-21 MED ORDER — SODIUM CHLORIDE 0.9 % IV SOLN
Freq: Once | INTRAVENOUS | Status: AC
  Filled 2019-07-21: qty 250

## 2019-07-21 MED ORDER — SODIUM CHLORIDE 0.9% FLUSH
10.0000 mL | INTRAVENOUS | Status: DC | PRN
  Administered 2019-07-21: 10 mL
  Filled 2019-07-21: qty 10

## 2019-07-21 MED ORDER — SODIUM CHLORIDE 0.9% FLUSH
10.0000 mL | Freq: Once | INTRAVENOUS | Status: AC
  Administered 2019-07-21 (×2): 10 mL
  Filled 2019-07-21: qty 10

## 2019-07-21 MED ORDER — HEPARIN SOD (PORK) LOCK FLUSH 100 UNIT/ML IV SOLN
500.0000 [IU] | Freq: Once | INTRAVENOUS | Status: AC | PRN
  Administered 2019-07-21: 500 [IU]
  Filled 2019-07-21: qty 5

## 2019-07-21 MED ORDER — PROCHLORPERAZINE MALEATE 10 MG PO TABS
10.0000 mg | ORAL_TABLET | Freq: Once | ORAL | Status: AC
  Administered 2019-07-21: 10 mg via ORAL

## 2019-07-21 MED ORDER — PROCHLORPERAZINE MALEATE 10 MG PO TABS
ORAL_TABLET | ORAL | Status: AC
  Filled 2019-07-21: qty 1

## 2019-07-21 MED ORDER — SODIUM CHLORIDE 0.9 % IV SOLN
800.0000 mg/m2 | Freq: Once | INTRAVENOUS | Status: AC
  Administered 2019-07-21: 15:00:00 1520 mg via INTRAVENOUS
  Filled 2019-07-21: qty 39.98

## 2019-07-21 NOTE — Patient Instructions (Signed)
[facility] Discharge Instructions for Patients Receiving Chemotherapy  Today you received the following chemotherapy agent: Gemcitabine  To help prevent nausea and vomiting after your treatment, we encourage you to take your nausea medication as directed by your MD.   If you develop nausea and vomiting that is not controlled by your nausea medication, call the clinic.   BELOW ARE SYMPTOMS THAT SHOULD BE REPORTED IMMEDIATELY:  *FEVER GREATER THAN 100.5 F  *CHILLS WITH OR WITHOUT FEVER  NAUSEA AND VOMITING THAT IS NOT CONTROLLED WITH YOUR NAUSEA MEDICATION  *UNUSUAL SHORTNESS OF BREATH  *UNUSUAL BRUISING OR BLEEDING  TENDERNESS IN MOUTH AND THROAT WITH OR WITHOUT PRESENCE OF ULCERS  *URINARY PROBLEMS  *BOWEL PROBLEMS  UNUSUAL RASH Items with * indicate a potential emergency and should be followed up as soon as possible.  Feel free to call the clinic should you have any questions or concerns. The clinic phone number is [phone].  Please show the CHEMO ALERT CARD at check-in to the Emergency Department and triage nurse.  Coronavirus (COVID-19) Are you at risk?  Are you at risk for the Coronavirus (COVID-19)?  To be considered HIGH RISK for Coronavirus (COVID-19), you have to meet the following criteria:  . Traveled to [place], [place], [place], [place] or [place]; or in the [place] to [place], [place], [place], or [place]; and have fever, cough, and shortness of breath within the last 2 weeks of travel OR . Been in close contact with a person diagnosed with COVID-19 within the last 2 weeks and have fever, cough, and shortness of breath . IF YOU DO NOT MEET THESE CRITERIA, YOU ARE CONSIDERED LOW RISK FOR COVID-19.  What to do if you are HIGH RISK for COVID-19?  . If you are having a medical emergency, call 911. . Seek medical care right away. Before you go to a doctor's office, urgent care or emergency department, call ahead and tell them  about your recent travel, contact with someone diagnosed with COVID-19, and your symptoms. You should receive instructions from your physician's office regarding next steps of care.  . When you arrive at healthcare provider, tell the healthcare staff immediately you have returned from visiting [place], [place], [place], [place] or [place]; or traveled in the [place] to [place], [place], [place], or [place]; in the last two weeks or you have been in close contact with a person diagnosed with COVID-19 in the last 2 weeks.   . Tell the health care staff about your symptoms: fever, cough and shortness of breath. . After you have been seen by a medical provider, you will be either: o Tested for (COVID-19) and discharged home on quarantine except to seek medical care if symptoms worsen, and asked to  - Stay home and avoid contact with others until you get your results (4-5 days)  - Avoid travel on public transportation if possible (such as bus, train, or airplane) or o Sent to the Emergency Department by EMS for evaluation, COVID-19 testing, and possible admission depending on your condition and test results.  What to do if you are LOW RISK for COVID-19?  Reduce your risk of any infection by using the same precautions used for avoiding the common cold or flu:  . Wash your hands often with soap and warm water for at least 20 seconds.  If soap and water are not readily available, use an alcohol-based hand sanitizer with at least 60% alcohol.  . If   coughing or sneezing, cover your mouth and nose by coughing or sneezing into the elbow areas of your shirt or coat, into a tissue or into your sleeve (not your hands). . Avoid shaking hands with others and consider head nods or verbal greetings only. . Avoid touching your eyes, nose, or mouth with unwashed hands.  . Avoid close contact with people who are sick. . Avoid places or events with large numbers of people in one location, like concerts or  sporting events. . Carefully consider travel plans you have or are making. . If you are planning any travel outside or inside the [place], visit the CDC's Travelers' Health webpage for the latest health notices. . If you have some symptoms but not all symptoms, continue to monitor at home and seek medical attention if your symptoms worsen. . If you are having a medical emergency, call 911.   ADDITIONAL HEALTHCARE OPTIONS FOR PATIENTS  [facility] / e-Visit: [email or web address]         [facility]: [phone]  [facility]: [phone]                   [facility]: [phone]   

## 2019-07-21 NOTE — Patient Instructions (Signed)

## 2019-08-01 ENCOUNTER — Ambulatory Visit
Admission: RE | Admit: 2019-08-01 | Discharge: 2019-08-01 | Disposition: A | Discharge status: Home / Self Care | Payer: Medicare Other | Source: Ambulatory Visit | Attending: Hematology and Oncology | Admitting: Hematology and Oncology

## 2019-08-01 DIAGNOSIS — C569 Malignant neoplasm of unspecified ovary: Secondary | ICD-10-CM | POA: Diagnosis not present

## 2019-08-01 DIAGNOSIS — K439 Ventral hernia without obstruction or gangrene: Secondary | ICD-10-CM | POA: Diagnosis not present

## 2019-08-01 DIAGNOSIS — C562 Malignant neoplasm of left ovary: Secondary | ICD-10-CM

## 2019-08-01 MED ORDER — IOPAMIDOL (ISOVUE-300) INJECTION 61%
100.0000 mL | Freq: Once | INTRAVENOUS | Status: AC | PRN
  Administered 2019-08-01: 09:00:00 100 mL via INTRAVENOUS

## 2019-08-04 ENCOUNTER — Inpatient Hospital Stay (HOSPITAL_BASED_OUTPATIENT_CLINIC_OR_DEPARTMENT_OTHER): Payer: Medicare Other | Admitting: Hematology and Oncology

## 2019-08-04 ENCOUNTER — Other Ambulatory Visit: Payer: Medicare Other

## 2019-08-04 ENCOUNTER — Encounter: Payer: Self-pay | Admitting: Hematology and Oncology

## 2019-08-04 ENCOUNTER — Other Ambulatory Visit: Payer: Self-pay

## 2019-08-04 ENCOUNTER — Ambulatory Visit: Payer: Medicare Other

## 2019-08-04 DIAGNOSIS — T451X5A Adverse effect of antineoplastic and immunosuppressive drugs, initial encounter: Secondary | ICD-10-CM | POA: Diagnosis not present

## 2019-08-04 DIAGNOSIS — C562 Malignant neoplasm of left ovary: Secondary | ICD-10-CM | POA: Diagnosis not present

## 2019-08-04 DIAGNOSIS — Z5111 Encounter for antineoplastic chemotherapy: Secondary | ICD-10-CM | POA: Diagnosis not present

## 2019-08-04 DIAGNOSIS — G620 Drug-induced polyneuropathy: Secondary | ICD-10-CM

## 2019-08-04 DIAGNOSIS — Z7189 Other specified counseling: Secondary | ICD-10-CM | POA: Diagnosis not present

## 2019-08-04 DIAGNOSIS — Z79899 Other long term (current) drug therapy: Secondary | ICD-10-CM | POA: Diagnosis not present

## 2019-08-04 DIAGNOSIS — C786 Secondary malignant neoplasm of retroperitoneum and peritoneum: Secondary | ICD-10-CM | POA: Diagnosis not present

## 2019-08-04 NOTE — Progress Notes (Signed)
[place] OFFICE PROGRESS NOTE  Patient Care Team: Marton Redwood, [name]  ASSESSMENT & PLAN:  Left ovarian epithelial cancer [facility]) I have reviewed multiple imaging studies with the patient and her daughter The patient have disease progression on gemcitabine, as shown, the peritoneal metastasis close to the spleen has almost doubled in size, with new peritoneal nodule found.  She has stable disease near her colon and the lymph node in the retroperitoneal has regressed. I explained to the patient and her daughter why we need to discontinue gemcitabine The patient has progressed on 3 different lines of treatment We have a very long extensive discussion about the goals of care   I reviewed with her the current guidelines If she desire further palliative chemotherapy, I recommend consideration for paclitaxel She is aware there would be risk of peripheral neuropathy, hair loss and pancytopenia with Taxol Her daughter inquired about the use of Dignicap to preserve her hair  The patient would like to think about it She will call me once she has made informed decisions abou the next step  Peripheral neuropathy due to chemotherapy [facility]) She has some intermittent symptoms radiating down to her left leg that could be due to neuropathy If she desired treatment with paclitaxel, we might have to prescribe some dose adjustment  Goals of care, counseling/discussion I had spent extensive amount of time discussing about goals of care on and off in the past few months I felt like the patient is somewhat leaning to its no treatment however, she also felt quite healthy and is wondering whether she should try something else She stated that she did not tolerate gemcitabine well and that raise an alarm as I perceive Taxol is a little harder to tolerate compared to gemcitabine and it may reduce her quality of life After very prolonged discussion, she would like to  go home and think about it with family If she choose not to pursue further treatment, I will refer her to home-based palliative care/hospice Her daughter will call me as soon as she has made up her mind about what to do next We discussed prognosis Without treatment, she will likely succumb to the disease in under 6 months   No orders of the defined types were placed in this encounter.   All questions were answered. The patient knows to call the clinic with any problems, questions or concerns. The total time spent in the appointment was 40 minutes encounter with patients including review of chart and various tests results, discussions about plan of care and coordination of care plan   [name], MD 08/04/2019 2:27 PM  INTERVAL HISTORY: Please see below for problem oriented charting. 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 Large complex left paramidline pelvic mass likely rises from the left ovary. Together with left periaortic adenopathy and peritoneal carcinomatosis, findings are worrisome for metastatic ovarian cancer. 2. Small pelvic  free fluid. 3.  Aortic atherosclerosis (ICD10-170.0).   01/21/2018 Pathology Results   Omentum, biopsy, dominant omental nodule within the right lower abdominal quadrant - POORLY DIFFERENTIATED CARCINOMA CONSISTENT WITH GYNECOLOGIC PRIMARY. - SEE MICROSCOPIC DESCRIPTION. Microscopic Comment The  tumor is characterized by sheets of tumor cells with enlarged nuclei, many containing prominent nucleoli. There are patchy areas with eosinophilic cytoplasmic inclusions. Immunohistochemistry shows positivity with cytokeratin AE1/AE3, cytokeratin 7, estrogen receptor, progesterone receptor, PAX-8, WT-1, and placental alkaline phosphatase (PLAP). The tumor is negative with alpha fetoprotein, human chorionic gonadotropin, CD117, CD10, CD30, CD56, CDX-2, CEA, cytokeratin 20, S100, SOX-10, synaptophysin, chromogranin, D2-40, GATA-3, GCDFP, Glypican 3 and Inhibin. 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 The estrogen receptor is 100% with strong staining and the progesterone receptor is 90% with strong staining.   01/21/2018 Procedure   Technically successful CT guided core needle biopsy of dominant omental nodule within the right lower abdominal quadrant, adjacent to the cecum.   01/24/2018 Cancer Staging   Staging form: Ovary, Fallopian Tube, and Primary Peritoneal Carcinoma, AJCC 8th Edition - Clinical: Stage IIIC (cT3c, cN1b, cM0) - Signed by [name], MD on 01/24/2018   01/25/2018 Tumor Marker   Patient's tumor was tested for the following markers: AFP Results of the tumor marker test revealed 4.7   01/31/2018 Surgery   Pre-operative Diagnosis: Ovarian cancer NOS  Post-operative Diagnosis: At least Stage 3 ovarian cancer   Operation: Laparoscopic omental biopsy  Surgeon: Mart Piggs, MD  Specimens: Omentum  Operative Findings: Omental nodule >2cm. Large left pelvic mass 12-15cm with sigmoid draped medially and potentially adherent to colon. The mass appeared to have a 3cm cystic area that was ruptured preoperatively. Uterus appears normal with small anterior fibroid ~1cm. Right adnexa nonenlarged. Small bowel without evidence of disease. No diaphragmatic disease.    01/31/2018 Pathology Results   Omentum, resection for  tumor - HIGH GRADE CARCINOMA. - SEE MICROSCOPIC DESCRIPTION. Microscopic Comment This case is sent to Dr. Annitta Jersey for consultation. The case was discussed with Dr. Gerarda Fraction on 02/01/18. (JDP:kh 02/01/18) The omentum shows nodules of high grade carcinoma characterized by diffuse sheets of malignant cells with enlarged nuclei with prominent nucleoli. There is moderate to abundant eosinophilic cytoplasm and frequent mitotic figures. 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 The entire specimen is examined histologically and there are nodules of tumor throughout the specimen with identical morphology. Additional immunohistochemistry for hepatoid markers will be performed and reported as an addendum   02/11/2018 Procedure   Status post right IJ port catheter placement. Catheter ready for use.   02/14/2018 Tumor Marker   Patient's tumor was tested for the following markers: CA-125 Results of the tumor marker test revealed 1523   02/15/2018 -  Chemotherapy   The patient had carboplatin and taxol. Taxol dose reduced from cycle 2 onwards   02/15/2018 - 07/05/2018 Chemotherapy   The patient had carboplatin and taxol x 6   02/23/2018 Imaging   Interval development of abdominal ascites.  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  No significant change in multilobulated complex left pelvic mass, which compresses the pelvic structures and displaces them to the right.  Interval development of mild right hydronephrosis and  hydroureter, possibly postobstructive.   03/08/2018 Tumor Marker   Patient's tumor was tested for the following markers: CA-125 Results of the tumor marker test revealed 1844   03/29/2018  Tumor Marker   Patient's tumor was tested for the following markers: CA-125 Results of the tumor marker test revealed 1141   04/10/2018 Imaging   1. New omental nodule. Otherwise, left ovarian mass and right paracolic gutter nodule have decreased in size in the interval. 2. Small ascites, increased. 3. Endometrial cavity may be dilated and contains fluid. Please correlate clinically. 4. Mild prominence of the ureters bilaterally, possibly due to large left adnexal mass. 5. Aortic atherosclerosis (ICD10-170.0). Coronary artery calcification.   04/25/2018 Surgery   Procedure(s) Performed:   1. Exploratory laparotomy  2. TAH/Bilateral salpingo-oophorectomy with radical tumor debulking for ovarian cancer . 3. Omentectomy  Surgeon: Bernita Raisin, MD Specimens: Bilateral tubes and ovaries, omentum, sigmoid nodule, cecal nodule   Operative Findings: Large left sided ovarian tumor wedged retroperitoneal and inferiorly into pelvis with some adherence to sigmoid mesentary. Some tumor nodules on surface of sigmoid epiploica that was removed. ~1-2 cm tumor implant lateral to cecum also excised. No obvious omental disease. This represented an optimal cytoreduction (R0) with no gross visible disease remaining. No carcinomatosis. Diaphragms and small bowel /mesentary all free of disease.    04/25/2018 Pathology Results   1. 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 - PRESUMED OVARIAN SURFACE INVOLVEMENT. - DEFINITIVE FALLOPIAN TUBE NOT IDENTIFIED. - SEE COMMENT AND ONCOLOGY TABLE. 2. Uterus and cervix, right tube and ovary - UTERUS: -ENDOMETRIUM: INACTIVE ENDOMETRIUM. NO HYPERPLASIA OR MALIGNANCY. -MYOMETRIUM: LEIOMYOMA. NO MALIGNANCY. -SEROSA: UNREMARKABLE. NO MALIGNANCY. - CERVIX: BENIGN SQUAMOUS AND ENDOCERVICAL MUCOSA. NO DYSPLASIA OR MALIGNANCY. - RIGHT OVARY: UNREMARKABLE. NO MALIGNANCY. - RIGHT FALLOPIAN TUBES: METASTATIC CARCINOMA. 3. Soft  tissue, biopsy, sigmoid implant - METASTATIC CARCINOMA. 4. Omentum, resection for tumor - METASTATIC CARCINOMA. 5. Soft tissue, biopsy, pelvic cecal implant - METASTATIC CARCINOMA. Microscopic Comment 1. OVARY or FALLOPIAN TUBE or PRIMARY PERITONEUM: Procedure: Left salpingo-oophorectomy. Hysterectomy and right salpingo-oophorectomy, omentectomy and implant biopsies. Specimen Integrity: Disrupted. Tumor Site: Left ovary. Ovarian Surface Involvement (required only if applicable): Presumed surface involvement, see comment. Fallopian Tube Surface Involvement (required only if applicable): Can not be determined, see comment. Tumor Size: 13.3 cm. Histologic Type: See comment. Histologic Grade: High grade. Implants (required for advanced stage serous/seromucinous borderline tumors only): Present. Other Tissue/ Organ Involvement: Right fallopian tube, sigmoid implant, omental implant, cecal implant. Largest Extrapelvic Peritoneal Focus (required only if applicable): 1.6 cm. Peritoneal/Ascitic Fluid: N/A. Treatment Effect (required only for high-grade serous carcinomas): Definitive effect not seen. Regional Lymph Nodes: No lymph nodes submitted or found. Pathologic Stage Classification (pTNM, AJCC 8th Edition): ypT3b, ypNX Representative Tumor Block: 1A-F Comment(s): Residual ovarian parenchyma is not identified and the entire mass consists of tumor, thus, surface involvement is presumably present. The left fallopian tube is not grossly or microscopically identified and thus involvement can not be assessed. A prior omental biopsy was sent to Dr. Annitta Jersey for consultation and was diagnosed as high grade carcinoma with hepatoid features. The current tumor is compared to the biopsy and looks morphologically identical.   05/06/2018 Tumor Marker   Patient's tumor was tested for the following markers: CA-125 Results of the tumor marker test revealed 190   06/10/2018 Tumor Marker   Patient's  tumor was tested for the following markers: CA-125 Results of the tumor marker test revealed 16.4  07/05/2018 Tumor Marker   Patient's tumor was tested for the following markers: CA-125 Results of the tumor marker test revealed 13.4   08/01/2018 Imaging   1. No evidence of metastatic disease in the chest, abdomen or pelvis. 2. Interval resection of the large left adnexal mass. Haziness of the lower mesenteric and distal pericolonic fat, favor postsurgical change. This study will serve as a new baseline for future surveillance. 3. Chronic findings include: Aortic Atherosclerosis (ICD10-I70.0). Small hiatal hernia. Moderate sigmoid diverticulosis.   08/01/2018 Tumor Marker   Patient's tumor was tested for the following markers: CA-125 Results of the tumor marker test revealed 12.1   08/26/2018 Procedure   Successful right IJ vein Port-A-Cath explant.   02/10/2019 Tumor Marker   Patient's tumor was tested for the following markers: CA-125 Results of the tumor marker test revealed 49.1   02/18/2019 Imaging   1. Interval development of mildly enlarged left para-aortic lymph node measuring 1.0 cm in short axis. This is nonspecific, but the possibility of nodal metastasis warrants consideration. Close attention on follow-up studies is recommended. 2. No other signs of potential metastatic disease noted elsewhere in the abdomen or pelvis. 3. However, there is a focal area of asymmetric mural thickening in the mid to distal sigmoid colon measuring 2.3 x 2.2 cm. The possibility of colonic neoplasm should be considered, and further evaluation with nonemergent outpatient colonoscopy is suggested in the near future to better evaluate this finding. 4. Several small infraumbilical ventral hernias containing short segments of mid to distal small bowel, without evidence of associated bowel incarceration or obstruction at this time. 5. There are calcifications of the aortic valve. Echocardiographic correlation  for evaluation of potential valvular dysfunction may be warranted if clinically indicated. 6. Aortic atherosclerosis. 7. Additional incidental findings, as above.   02/28/2019 Procedure   Successful placement of a right IJ approach Power Port with ultrasound and fluoroscopic guidance. The catheter is ready for use.     03/05/2019 - 05/27/2019 Chemotherapy   The patient had carboplatin and Doxil for chemotherapy treatment.     04/02/2019 Tumor Marker   Patient's tumor was tested for the following markers: CA-125 Results of the tumor marker test revealed 52.6.   04/30/2019 Tumor Marker   Patient's tumor was tested for the following markers: CA-125 Results of the tumor marker test revealed 65.7   05/20/2019 Imaging   1. Serosal metastasis along the sigmoid colon is stable from 02/19/2019. Nodules in the sigmoid mesentery are new from 08/01/2018 and indicative of metastatic disease. 2. Left periaortic lymph node is no longer pathologically enlarged. 3. Multiple midline ventral hernias, described above. 4. Aortic atherosclerosis (ICD10-170.0). Coronary artery calcification.   05/30/2019 - 07/21/2019 Chemotherapy   The patient had gemcitabine for chemotherapy treatment.     05/30/2019 Tumor Marker   Patient's tumor was tested for the following markers: CA-125 Results of the tumor marker test revealed 58.6   06/30/2019 Tumor Marker   Patient's tumor was tested for the following markers: CA-125 Results of the tumor marker test revealed 49.5.   08/01/2019 Imaging   1. There has been mild interval progression of peritoneal disease. There has been interval increase in size of serosal implant along the surface of the distal sigmoid colon. There is also a new small soft tissue nodule within the right iliac fossa. Enlarging peritoneal implant on the posteromedial surface of the spleen. 2. Previous borderline enlarged left retroperitoneal lymph node has decreased in size from previous exam. 3.  Multiple small infraumbilical  ventral hernias containing short segments of small bowel. No obstruction.   Aortic Atherosclerosis (ICD10-I70.0).       REVIEW OF SYSTEMS:   Constitutional: Denies fevers, chills or abnormal weight loss Eyes: Denies blurriness of vision Ears, nose, mouth, throat, and face: Denies mucositis or sore throat Respiratory: Denies cough, dyspnea or wheezes Cardiovascular: Denies palpitation, chest discomfort or lower extremity swelling Skin: Denies abnormal skin rashes Lymphatics: Denies new lymphadenopathy or easy bruising Behavioral/Psych: Mood is stable, no new changes  All other systems were reviewed with the patient and are negative.  I have reviewed the past medical history, past surgical history, social history and family history with the patient and they are unchanged from previous note.  ALLERGIES:  has No Known Allergies.  MEDICATIONS:  Current Outpatient Medications  Medication Sig Dispense Refill  . acetaminophen (TYLENOL) 500 MG tablet Take 500 mg by mouth every 8 (eight) hours as needed for mild pain or headache.     Marland Kitchen amLODipine (NORVASC) 5 MG tablet Take 5 mg by mouth at bedtime.     . fluticasone (FLONASE) 50 MCG/ACT nasal spray Place 2 sprays into both nostrils 2 (two) times daily. (Patient taking differently: Place 2 sprays into both nostrils as needed. ) 16 g 2  . levothyroxine (SYNTHROID, LEVOTHROID) 88 MCG tablet Take 88 mcg by mouth daily before breakfast.    . lidocaine-prilocaine (EMLA) cream Apply to affected area once 30 g 3  . loratadine (CLARITIN) 10 MG tablet Take 10 mg by mouth daily as needed for allergies.     . metoprolol succinate (TOPROL-XL) 50 MG 24 hr tablet Take 50 mg by mouth every morning.     . Multiple Vitamins-Minerals (MULTIVITAMINS THER. W/MINERALS) TABS Take 1 tablet by mouth daily.      . naphazoline-pheniramine (NAPHCON-A) 0.025-0.3 % ophthalmic solution Place 1 drop into both eyes daily as needed for eye  irritation.    Marland Kitchen olmesartan (BENICAR) 40 MG tablet Take 40 mg by mouth at bedtime.     . ondansetron (ZOFRAN) 8 MG tablet Take 1 tablet (8 mg total) by mouth every 8 (eight) hours as needed. 30 tablet 1  . ondansetron (ZOFRAN-ODT) 8 MG disintegrating tablet Take 1 tablet (8 mg total) by mouth every 8 (eight) hours as needed for nausea or vomiting. 20 tablet 0  . prochlorperazine (COMPAZINE) 10 MG tablet Take 1 tablet (10 mg total) by mouth every 6 (six) hours as needed (Nausea or vomiting). 30 tablet 1  . traMADol (ULTRAM) 50 MG tablet Take 1 tablet (50 mg total) by mouth every 6 (six) hours as needed. 30 tablet 0   No current facility-administered medications for this visit.    PHYSICAL EXAMINATION: ECOG PERFORMANCE STATUS: 1 - Symptomatic but completely ambulatory  Vitals:   08/04/19 1134  BP: (!) 182/76  Pulse: 68  Resp: 18  Temp: 98.3 F (36.8 C)  SpO2: 100%   Filed Weights   08/04/19 1134  Weight: 172 lb 9.6 oz (78.3 kg)    GENERAL:alert, no distress and comfortable NEURO: alert & oriented x 3 with fluent speech, no focal motor/sensory deficits  LABORATORY DATA:  I have reviewed the data as listed    Component Value Date/Time   NA 139 07/21/2019 1305   K 4.5 07/21/2019 1305   CL 104 07/21/2019 1305   CO2 27 07/21/2019 1305   GLUCOSE 98 07/21/2019 1305   BUN 13 07/21/2019 1305   CREATININE 1.05 (H) 07/21/2019 1305   CALCIUM 9.5 07/21/2019 1305  PROT 7.3 07/21/2019 1305   ALBUMIN 3.9 07/21/2019 1305   AST 30 07/21/2019 1305   ALT 34 07/21/2019 1305   ALKPHOS 91 07/21/2019 1305   BILITOT 0.3 07/21/2019 1305   GFRNONAA 48 (L) 07/21/2019 1305   GFRAA 56 (L) 07/21/2019 1305    No results found for: SPEP, UPEP  Lab Results  Component Value Date   WBC 4.1 07/21/2019   NEUTROABS 1.6 (L) 07/21/2019   HGB 11.1 (L) 07/21/2019   HCT 34.1 (L) 07/21/2019   MCV 98.6 07/21/2019   PLT 323 07/21/2019      Chemistry      Component Value Date/Time   NA 139  07/21/2019 1305   K 4.5 07/21/2019 1305   CL 104 07/21/2019 1305   CO2 27 07/21/2019 1305   BUN 13 07/21/2019 1305   CREATININE 1.05 (H) 07/21/2019 1305      Component Value Date/Time   CALCIUM 9.5 07/21/2019 1305   ALKPHOS 91 07/21/2019 1305   AST 30 07/21/2019 1305   ALT 34 07/21/2019 1305   BILITOT 0.3 07/21/2019 1305       RADIOGRAPHIC STUDIES: I have reviewed multiple imaging studies with the patient and her daughter I have personally reviewed the radiological images as listed and agreed with the findings in the report. CT ABDOMEN PELVIS W CONTRAST  Result Date: 08/01/2019 CLINICAL DATA:  Restaging ovarian cancer. EXAM: CT ABDOMEN AND PELVIS WITH CONTRAST TECHNIQUE: Multidetector CT imaging of the abdomen and pelvis was performed using the standard protocol following bolus administration of intravenous contrast. CONTRAST:  167m ISOVUE-300 IOPAMIDOL (ISOVUE-300) INJECTION 61% COMPARISON:  05/21/2019 FINDINGS: Lower chest: No acute abnormality. Hepatobiliary: No focal liver abnormality is seen. No gallstones, gallbladder wall thickening, or biliary dilatation. Pancreas: Unremarkable. No pancreatic ductal dilatation or surrounding inflammatory changes. Spleen: There is a small peritoneal on the posteromedial surface of the spleen measuring 1.7 x 1.0 cm, image 19/2. Previously 0.8 x 0.5 cm. Adrenals/Urinary Tract: Normal appearance of the adrenal glands. Small, too small to characterize, bilateral low-attenuation kidney lesions are again noted. No mass or hydronephrosis. Stomach/Bowel: Small hiatal hernia. There is no small bowel wall thickening, inflammation, or distension. Suspect serosal implant along the surface of the distal sigmoid colon measures 2.2 x 2.4 cm, image 56/2. Previously 2.2 x 2.2 cm. Vascular/Lymphatic: Aortic atherosclerosis. No aneurysm. No abdominopelvic adenopathy identified. Previous left retroperitoneal lymph node measures 0.5 cm short axis. On the prior exam this  measured 1 cm. Reproductive: Status post hysterectomy. No adnexal masses. Other: No ascites or focal fluid collections identified. New small soft tissue nodule within the right iliac fossa measures 6 mm, image 53/2. There are multiple small stone infraumbilical ventral hernias containing short segments of small bowel. No obstruction. Musculoskeletal: No acute or significant osseous findings. Right hip arthroplasty IMPRESSION: 1. There has been mild interval progression of peritoneal disease. There has been interval increase in size of serosal implant along the surface of the distal sigmoid colon. There is also a new small soft tissue nodule within the right iliac fossa. Enlarging peritoneal implant on the posteromedial surface of the spleen. 2. Previous borderline enlarged left retroperitoneal lymph node has decreased in size from previous exam. 3. Multiple small infraumbilical ventral hernias containing short segments of small bowel. No obstruction. Aortic Atherosclerosis (ICD10-I70.0). Electronically Signed   By: TKerby MoorsM.D.   On: 08/01/2019 10:42

## 2019-08-04 NOTE — Assessment & Plan Note (Signed)
She has some intermittent symptoms radiating down to her left leg that could be due to neuropathy If she desired treatment with paclitaxel, we might have to prescribe some dose adjustment

## 2019-08-04 NOTE — Assessment & Plan Note (Signed)
I had spent extensive amount of time discussing about goals of care on and off in the past few months I felt like the patient is somewhat leaning to its no treatment however, she also felt quite healthy and is wondering whether she should try something else She stated that she did not tolerate gemcitabine well and that raise an alarm as I perceive Taxol is a little harder to tolerate compared to gemcitabine and it may reduce her quality of life After very prolonged discussion, she would like to go home and think about it with family If she choose not to pursue further treatment, I will refer her to home-based palliative care/hospice Her daughter will call me as soon as she has made up her mind about what to do next We discussed prognosis Without treatment, she will likely succumb to the disease in under 6 months

## 2019-08-04 NOTE — Assessment & Plan Note (Signed)
I have reviewed multiple imaging studies with the patient and her daughter The patient have disease progression on gemcitabine, as shown, the peritoneal metastasis close to the spleen has almost doubled in size, with new peritoneal nodule found.  She has stable disease near her colon and the lymph node in the retroperitoneal has regressed. I explained to the patient and her daughter why we need to discontinue gemcitabine The patient has progressed on 3 different lines of treatment We have a very long extensive discussion about the goals of care   I reviewed with her the current guidelines If she desire further palliative chemotherapy, I recommend consideration for paclitaxel She is aware there would be risk of peripheral neuropathy, hair loss and pancytopenia with Taxol Her daughter inquired about the use of Dignicap to preserve her hair  The patient would like to think about it She will call me once she has made informed decisions abou the next step

## 2019-08-08 ENCOUNTER — Telehealth: Payer: Self-pay | Admitting: Oncology

## 2019-08-08 NOTE — Telephone Encounter (Signed)
Called [name] back and advised her of message below from Dr. Alvy Bimler.  She is going to discuss with [facility] and encourage her to keep her port for now.

## 2019-08-08 NOTE — Telephone Encounter (Signed)
1) I told her if she changed her mind later and it has been more than 1 month since her last Ct she would need a repeat CT before treatment 2) She will likely get weaker and if she becomes too debilitated from disease then she may not be able to receive chemo. It is hard to define when is the point of no return 3) If the port does not bother her I suggest just leave it but if it bothers her we can get it removed. We typically do not flush it 4) Please get Shannon Ellis to refer her to [place] palliative care/hospice to establish the service

## 2019-08-08 NOTE — Telephone Encounter (Signed)
Shannon Ellis (daughter) called and said Shannon Ellis doesn't think she wants to have more chemotherapy.  She is feeling better now than she has and is concerned that chemo will make her feel terrible and she is worried that it isn't going to work.  She does want to know, if she starts having symptoms/feeling bad later on if she can change her mind and have chemo.  Also discussed palliative care/hospice. Shannon Ellis said Shannon Ellis doesn't really need anything right now but does want to have it in place in case she needs anything in the future.  Discussed that hospice is excellent at managing any symptoms/needs she may have in the future.   She also asked if Shannon Ellis can have her porta cath removed.

## 2019-08-11 ENCOUNTER — Ambulatory Visit: Payer: Medicare Other

## 2019-08-11 ENCOUNTER — Other Ambulatory Visit: Payer: Medicare Other

## 2019-08-25 ENCOUNTER — Telehealth: Payer: Self-pay | Admitting: Oncology

## 2019-08-25 NOTE — Telephone Encounter (Signed)
Received message from [place] that they have not been able to reach [facility] to set up palliative care/hospice.  Called [name] (daughter) and she said Avan is feeling good and doesn't understand why she needs hospice now.  She would also like to get her port removed.  [name] said it may help if I call [place].  Left Anjanae a message to call back to discuss.

## 2019-08-27 ENCOUNTER — Other Ambulatory Visit: Payer: Self-pay | Admitting: Hematology and Oncology

## 2019-08-27 DIAGNOSIS — C562 Malignant neoplasm of left ovary: Secondary | ICD-10-CM

## 2019-08-27 NOTE — Telephone Encounter (Signed)
I placed order for port removal I suggest we start with palliative care and transition to hospice when ready If she insist no, then we need to document that is her choice

## 2019-08-27 NOTE — Telephone Encounter (Signed)
Called Shannon Ellis and discussed her port and the palliative care/hospice referral.  She said the port is bothering her (it itches) and she would like to get it taken out.  She is hesitant about the hospice referral because she is feeling good,  is worried about the cost and has bad memories of when her mother was in hospice.  Advised her that she can hold off on the hospice referral and to call back if she changes her mind.

## 2019-08-28 NOTE — Telephone Encounter (Signed)
Called Shannon Ellis and let her know that she will be receiving a call to have her port removed.  Also discussed palliative care consult and she is agreeable to having it set up.  She would like them to call her daughter [name] with the appointment.  Left message for [name] to let her know palliative care will be calling.  Called [facility] and spoke to [place]. She will give [name] a call.

## 2019-09-03 ENCOUNTER — Telehealth: Payer: Self-pay | Admitting: Oncology

## 2019-09-03 NOTE — Telephone Encounter (Signed)
[name] (daughter) called and said Shannon Ellis is having pain in her left hip from an injury 30 years ago.  She is wondering if it is ok for her take Advil as tylenol doesn't help.  She said they remember Dr. Alvy Bimler saying that she shouldn't take Advil due to her age and just wanted to double check.

## 2019-09-03 NOTE — Telephone Encounter (Signed)
Received call from [place], Shannon Ellis and [employer] Shannon Ellis is still undecided about Palliative Care because she is feeling well and not wanting to feel dependent on anyone.  Discussed that she should set up palliative care in case she needs it in the future and that she does not need to use it now if she doesn't want to.  Shannon Ellis verbalized agreement and said that she does want to set it up in case she would need anything. Shannon Ellis is going to call [facility] to set up palliative care. Also advised Shannon Ellis that she can call anytime if she has any questions going forward.

## 2019-09-04 NOTE — Telephone Encounter (Signed)
Called [name] back and advised her of message from Dr. Alvy Bimler.  She verbalized understanding.

## 2019-09-04 NOTE — Telephone Encounter (Signed)
Yes she can take tylenol or advil She is not on treatment now so no need to worry

## 2019-09-07 ENCOUNTER — Other Ambulatory Visit: Payer: Self-pay | Admitting: Radiology

## 2019-09-09 ENCOUNTER — Other Ambulatory Visit: Payer: Self-pay

## 2019-09-09 ENCOUNTER — Ambulatory Visit (HOSPITAL_COMMUNITY)
Admission: RE | Admit: 2019-09-09 | Discharge: 2019-09-09 | Disposition: A | Discharge status: Home / Self Care | Payer: Medicare Other | Source: Ambulatory Visit | Attending: Hematology and Oncology | Admitting: Hematology and Oncology

## 2019-09-09 ENCOUNTER — Encounter (HOSPITAL_COMMUNITY): Payer: Self-pay

## 2019-09-09 DIAGNOSIS — Z90710 Acquired absence of both cervix and uterus: Secondary | ICD-10-CM | POA: Insufficient documentation

## 2019-09-09 DIAGNOSIS — Z96641 Presence of right artificial hip joint: Secondary | ICD-10-CM | POA: Insufficient documentation

## 2019-09-09 DIAGNOSIS — Z90722 Acquired absence of ovaries, bilateral: Secondary | ICD-10-CM | POA: Insufficient documentation

## 2019-09-09 DIAGNOSIS — Z809 Family history of malignant neoplasm, unspecified: Secondary | ICD-10-CM | POA: Insufficient documentation

## 2019-09-09 DIAGNOSIS — Z8042 Family history of malignant neoplasm of prostate: Secondary | ICD-10-CM | POA: Insufficient documentation

## 2019-09-09 DIAGNOSIS — E890 Postprocedural hypothyroidism: Secondary | ICD-10-CM | POA: Insufficient documentation

## 2019-09-09 DIAGNOSIS — M1990 Unspecified osteoarthritis, unspecified site: Secondary | ICD-10-CM | POA: Insufficient documentation

## 2019-09-09 DIAGNOSIS — Z803 Family history of malignant neoplasm of breast: Secondary | ICD-10-CM | POA: Insufficient documentation

## 2019-09-09 DIAGNOSIS — C562 Malignant neoplasm of left ovary: Secondary | ICD-10-CM | POA: Diagnosis not present

## 2019-09-09 DIAGNOSIS — Z452 Encounter for adjustment and management of vascular access device: Secondary | ICD-10-CM | POA: Insufficient documentation

## 2019-09-09 DIAGNOSIS — I10 Essential (primary) hypertension: Secondary | ICD-10-CM | POA: Diagnosis not present

## 2019-09-09 DIAGNOSIS — Z79899 Other long term (current) drug therapy: Secondary | ICD-10-CM | POA: Insufficient documentation

## 2019-09-09 DIAGNOSIS — Z808 Family history of malignant neoplasm of other organs or systems: Secondary | ICD-10-CM | POA: Diagnosis not present

## 2019-09-09 DIAGNOSIS — Z79890 Hormone replacement therapy (postmenopausal): Secondary | ICD-10-CM | POA: Insufficient documentation

## 2019-09-09 DIAGNOSIS — Z801 Family history of malignant neoplasm of trachea, bronchus and lung: Secondary | ICD-10-CM | POA: Insufficient documentation

## 2019-09-09 HISTORY — PX: IR REMOVAL TUN ACCESS W/ PORT W/O FL MOD SED: IMG2290

## 2019-09-09 LAB — CBC WITH DIFFERENTIAL/PLATELET
Abs Immature Granulocytes: 0.03 10*3/uL (ref 0.00–0.07)
Basophils Absolute: 0 10*3/uL (ref 0.0–0.1)
Basophils Relative: 1 %
Eosinophils Absolute: 0.4 10*3/uL (ref 0.0–0.5)
Eosinophils Relative: 5 %
HCT: 38.2 % (ref 36.0–46.0)
Hemoglobin: 12.3 g/dL (ref 12.0–15.0)
Immature Granulocytes: 0 %
Lymphocytes Relative: 21 %
Lymphs Abs: 1.6 10*3/uL (ref 0.7–4.0)
MCH: 30.8 pg (ref 26.0–34.0)
MCHC: 32.2 g/dL (ref 30.0–36.0)
MCV: 95.5 fL (ref 80.0–100.0)
Monocytes Absolute: 0.7 10*3/uL (ref 0.1–1.0)
Monocytes Relative: 9 %
Neutro Abs: 4.9 10*3/uL (ref 1.7–7.7)
Neutrophils Relative %: 64 %
Platelets: 265 10*3/uL (ref 150–400)
RBC: 4 MIL/uL (ref 3.87–5.11)
RDW: 13.2 % (ref 11.5–15.5)
WBC: 7.5 10*3/uL (ref 4.0–10.5)
nRBC: 0 % (ref 0.0–0.2)

## 2019-09-09 LAB — PROTIME-INR
INR: 1 (ref 0.8–1.2)
Prothrombin Time: 13.2 seconds (ref 11.4–15.2)

## 2019-09-09 MED ORDER — MIDAZOLAM HCL 2 MG/2ML IJ SOLN
INTRAMUSCULAR | Status: AC
  Filled 2019-09-09: qty 2

## 2019-09-09 MED ORDER — FENTANYL CITRATE (PF) 100 MCG/2ML IJ SOLN
INTRAMUSCULAR | Status: AC
  Filled 2019-09-09: qty 2

## 2019-09-09 MED ORDER — CEFAZOLIN SODIUM-DEXTROSE 2-4 GM/100ML-% IV SOLN
INTRAVENOUS | Status: AC
  Administered 2019-09-09: 2 g via INTRAVENOUS
  Filled 2019-09-09: qty 100

## 2019-09-09 MED ORDER — MIDAZOLAM HCL 2 MG/2ML IJ SOLN
INTRAMUSCULAR | Status: AC | PRN
  Administered 2019-09-09 (×2): 1 mg via INTRAVENOUS

## 2019-09-09 MED ORDER — CEFAZOLIN SODIUM-DEXTROSE 2-4 GM/100ML-% IV SOLN: 2.0000 g | INTRAVENOUS | Status: AC

## 2019-09-09 MED ORDER — LIDOCAINE-EPINEPHRINE 1 %-1:100000 IJ SOLN
INTRAMUSCULAR | Status: AC | PRN
  Administered 2019-09-09: 10 mL via INTRADERMAL

## 2019-09-09 MED ORDER — SODIUM CHLORIDE 0.9 % IV SOLN: INTRAVENOUS | Status: DC

## 2019-09-09 MED ORDER — FENTANYL CITRATE (PF) 100 MCG/2ML IJ SOLN
INTRAMUSCULAR | Status: AC | PRN
  Administered 2019-09-09: 50 ug via INTRAVENOUS

## 2019-09-09 MED ORDER — LIDOCAINE-EPINEPHRINE 1 %-1:100000 IJ SOLN
INTRAMUSCULAR | Status: AC
  Filled 2019-09-09: qty 1

## 2019-09-09 NOTE — Consult Note (Signed)
Chief Complaint: Port removal patient preference  Referring Physician(s): [place]  Supervising Physician: Jacqulynn Cadet  Patient Status: [facility] - Out-pt  History of Present Illness: Shannon Ellis is a 86 y.o. female story of ovarian cancer. IR placed a portacath on 9.11.20. Team is requesting portacath removal per patient preference.   Past Medical History:  Diagnosis Date  . Allergy   . Anemia   . Blood transfusion without reported diagnosis   . Complication of anesthesia    waking up-does not take much medication  . Family history of adverse reaction to anesthesia    problems waking up as does not need much medication  . Family history of breast cancer   . Family history of melanoma   . Family history of prostate cancer   . Hypertension   . Hypothyroidism   . Osteoporosis 10/07   Dexa scan  . ovarian ca dx'd 2019  . PONV (postoperative nausea and vomiting)     Past Surgical History:  Procedure Laterality Date  . APPENDECTOMY  1950   not ruptured  . DEBULKING N/A 04/25/2018   Procedure: DEBULKING;  Surgeon: [name], MD;  Location: [facility] ORS;  Service: Gynecology;  Laterality: N/A;  . HYSTERECTOMY ABDOMINAL WITH SALPINGO-OOPHORECTOMY Bilateral 04/25/2018   Procedure: HYSTERECTOMY ABDOMINAL WITH BILATERAL SALPINGO-OOPHORECTOMY;  Surgeon: [name], MD;  Location: [facility];  Service: Gynecology;  Laterality: Bilateral;  . IR IMAGING GUIDED PORT INSERTION  02/11/2018  . IR IMAGING GUIDED PORT INSERTION  02/28/2019  . IR REMOVAL TUN ACCESS W/ PORT W/O FL MOD SED  08/26/2018  . LAPAROSCOPY N/A 01/31/2018   Procedure: LAPAROSCOPY DIAGNOSTIC WITH BIOPSY;  Surgeon: [name], MD;  Location: [facility];  Service: Gynecology;  Laterality: N/A;  . OMENTECTOMY N/A 04/25/2018   Procedure: OMENTECTOMY;  Surgeon: [name], MD;  Location: [facility];  Service: Gynecology;  Laterality: N/A;  . ovarian biopsy    . THYROIDECTOMY    . TONSILLECTOMY    . TOTAL  HIP ARTHROPLASTY Right     Allergies: Patient has no known allergies.  Medications: Prior to Admission medications   Medication Sig Start Date End Date Taking? Authorizing Provider  acetaminophen (TYLENOL) 500 MG tablet Take 500 mg by mouth every 8 (eight) hours as needed for mild pain or headache.     [provider]  amLODipine (NORVASC) 5 MG tablet Take 5 mg by mouth at bedtime.     [provider]  fluticasone (FLONASE) 50 MCG/ACT nasal spray Place 2 sprays into both nostrils 2 (two) times daily. Patient taking differently: Place 2 sprays into both nostrils as needed.  06/13/18   Tanner, Lyndon Code., PA-C  levothyroxine (SYNTHROID, LEVOTHROID) 88 MCG tablet Take 88 mcg by mouth daily before breakfast.    [provider]  lidocaine-prilocaine (EMLA) cream Apply to affected area once 02/26/19   [name], MD  loratadine (CLARITIN) 10 MG tablet Take 10 mg by mouth daily as needed for allergies.     [provider]  metoprolol succinate (TOPROL-XL) 50 MG 24 hr tablet Take 50 mg by mouth every morning.  10/15/17   [provider]  Multiple Vitamins-Minerals (MULTIVITAMINS THER. W/MINERALS) TABS Take 1 tablet by mouth daily.      [provider]  naphazoline-pheniramine (NAPHCON-A) 0.025-0.3 % ophthalmic solution Place 1 drop into both eyes daily as needed for eye irritation.    [provider]  olmesartan (BENICAR) 40 MG tablet Take 40 mg by mouth  at bedtime.     [provider]  ondansetron (ZOFRAN) 8 MG tablet Take 1 tablet (8 mg total) by mouth every 8 (eight) hours as needed. 02/26/19   [name], MD  ondansetron (ZOFRAN-ODT) 8 MG disintegrating tablet Take 1 tablet (8 mg total) by mouth every 8 (eight) hours as needed for nausea or vomiting. 03/05/19   Tanner, Lyndon Code., PA-C  prochlorperazine (COMPAZINE) 10 MG tablet Take 1 tablet (10 mg total) by mouth every 6 (six) hours as needed (Nausea or vomiting). 02/26/19   [name], MD   traMADol (ULTRAM) 50 MG tablet Take 1 tablet (50 mg total) by mouth every 6 (six) hours as needed. 05/30/19   [name], MD     Family History  Problem Relation Age of Onset  . Diabetes Mother   . Stroke Mother   . Breast cancer Sister 36       breast ca  . Diabetes Sister   . Lung cancer Brother   . Stroke Brother   . Asthma Maternal Grandmother   . Melanoma Grandson 20  . Prostate cancer Nephew   . Cancer Cousin        mat first cousin, unknown cancer    Social History   Socioeconomic History  . Marital status: Widowed    Spouse name: Not on file  . Number of children: 4  . Years of education: Not on file  . Highest education level: Not on file  Occupational History  . 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 Smoking status: Never Smoker  . Smokeless tobacco: Never Used  Substance and Sexual Activity  . Alcohol use: No    Alcohol/week: 0.0 standard drinks  . Drug use: No  . Sexual activity: Not Currently  Other Topics Concern  . Not on file  Social History Narrative  . Not on file   Social Determinants of Health   Financial Resource Strain:   . Difficulty of Paying Living Expenses:   Food Insecurity:   . Worried About Charity fundraiser in the Last Year:   . Arboriculturist in the Last Year:   Transportation Needs:   . Film/video editor (Medical):   Marland Kitchen Lack of Transportation (Non-Medical):   Physical Activity:   . Days of Exercise per Week:   . Minutes of Exercise per Session:   Stress:   . Feeling of Stress :   Social Connections:   . Frequency of Communication with Friends and Family:   . Frequency of Social Gatherings with Friends and Family:   . Attends Religious Services:   . Active Member of Clubs or Organizations:   . Attends Archivist Meetings:   Marland Kitchen Marital Status:      Review of Systems: A 12 point ROS discussed and pertinent positives are indicated in the HPI above.  All other systems are  negative.  Review of Systems  Constitutional: Negative for fatigue and fever.  HENT: Negative for congestion.   Respiratory: Negative for cough and shortness of breath.   Gastrointestinal: Negative for abdominal pain, diarrhea, nausea and vomiting.    Vital Signs: There were no vitals taken for this visit.  Physical Exam Vitals and nursing note reviewed.  Constitutional:      Appearance: She is well-developed.  HENT:     Head: Normocephalic and atraumatic.  Eyes:     Conjunctiva/sclera: Conjunctivae normal.  Cardiovascular:  Rate and Rhythm: Normal rate and regular rhythm.     Heart sounds: Normal heart sounds.  Pulmonary:     Effort: Pulmonary effort is normal.     Breath sounds: Normal breath sounds.  Musculoskeletal:        General: Normal range of motion.     Cervical back: Normal range of motion.  Skin:    General: Skin is warm.  Neurological:     Mental Status: She is alert and oriented to person, place, and time.     Imaging: No results found.  Labs:  CBC: Recent Labs    06/23/19 1150 06/30/19 1440 07/14/19 1130 07/21/19 1305  WBC 6.9 3.6* 7.6 4.1  HGB 11.3* 10.8* 11.4* 11.1*  HCT 35.0* 31.9* 34.1* 34.1*  PLT 477* 268 341 323    COAGS: Recent Labs    01/06/19 1918 02/28/19 0910  INR 1.0 1.1  APTT 27  --     BMP: Recent Labs    06/23/19 1150 06/30/19 1440 07/14/19 1130 07/21/19 1305  NA 138 136 138 139  K 4.1 4.3 4.0 4.5  CL 105 103 106 104  CO2 26 24 23 27   GLUCOSE 96 104* 100* 98  BUN 15 13 8 13   CALCIUM 9.7 9.1 9.3 9.5  CREATININE 0.67 0.78 0.75 1.05*  GFRNONAA >60 >60 >60 48*  GFRAA >60 >60 >60 56*    LIVER FUNCTION TESTS: Recent Labs    06/23/19 1150 06/30/19 1440 07/14/19 1130 07/21/19 1305  BILITOT 0.3 0.3 0.4 0.3  AST 23 35 33 30  ALT 24 50* 27 34  ALKPHOS 82 100 88 91  PROT 7.1 7.3 7.1 7.3  ALBUMIN 4.0 3.9 3.8 3.9    Assessment and Plan:  86 y.o, female outpatient. History of ovarian cancer. IR placed  a portacath on 9.11.20. Team is requesting portacath removal per patient preference.  Pertinent Imaging none  Pertinent IR History 9.11.20 - Portacath placement RIJ 3.9.20 - Port removal RIJ no longer needed 8.26.19 - Placement portacath RIJ  Pertinent Allergies NKDA  All labs and medications are within acceptable parameters.  Patient is afebrile.  Risks and benefits of image guided port-a-catheter removal t was discussed with the patient including, but not limited to bleeding, infection, pneumothorax, and need for additional procedures.  All of the patient's questions were answered, patient is agreeable to proceed. Consent signed and in chart.    Thank you for this interesting consult.  I greatly enjoyed meeting Shannon Ellis and look forward to participating in their care.  A copy of this report was sent to the requesting provider on this date.  Electronically Signed: Avel Peace, NP 09/09/2019, 1:42 PM   I spent a total of  40 Minutes   in face to face in clinical consultation, greater than 50% of which was counseling/coordinating care for portacath removal

## 2019-09-09 NOTE — Procedures (Signed)
Interventional Radiology Procedure Note  Procedure: Successful removal of right chest portacatheter.   Complications: None  Estimated Blood Loss: None  Recommendations: - DC home   Signed,  Earnest Mcgillis K. Markham Dumlao, MD   

## 2019-09-09 NOTE — Discharge Instructions (Signed)
Please call Interventional Radiology clinic [phone] with any questions or concerns.  You may remove your dressing and shower tomorrow.   Moderate Conscious Sedation, Adult, Care After These instructions provide you with information about caring for yourself after your procedure. Your health care provider may also give you more specific instructions. Your treatment has been planned according to current medical practices, but problems sometimes occur. Call your health care provider if you have any problems or questions after your procedure. What can I expect after the procedure? After your procedure, it is common:  To feel sleepy for several hours.  To feel clumsy and have poor balance for several hours.  To have poor judgment for several hours.  To vomit if you eat too soon. Follow these instructions at home: For at least 24 hours after the procedure:   Do not: ? Participate in activities where you could fall or become injured. ? Drive. ? Use heavy machinery. ? Drink alcohol. ? Take sleeping pills or medicines that cause drowsiness. ? Make important decisions or sign legal documents. ? Take care of children on your own.  Rest. Eating and drinking  Follow the diet recommended by your health care provider.  If you vomit: ? Drink water, juice, or soup when you can drink without vomiting. ? Make sure you have little or no nausea before eating solid foods. General instructions  Have a responsible adult stay with you until you are awake and alert.  Take over-the-counter and prescription medicines only as told by your health care provider.  If you smoke, do not smoke without supervision.  Keep all follow-up visits as told by your health care provider. This is important. Contact a health care provider if:  You keep feeling nauseous or you keep vomiting.  You feel light-headed.  You develop a rash.  You have a fever. Get help right away if:  You have trouble  breathing. This information is not intended to replace advice given to you by your health care provider. Make sure you discuss any questions you have with your health care provider. Document Revised: 05/18/2017 Document Reviewed: 09/25/2015 Elsevier Patient Education  2020 Elsevier Inc.   Implanted Port Removal, Care After This sheet gives you information about how to care for yourself after your procedure. Your health care provider may also give you more specific instructions. If you have problems or questions, contact your health care provider. What can I expect after the procedure? After the procedure, it is common to have:  Soreness or pain near your incision.  Some swelling or bruising near your incision. Follow these instructions at home: Medicines  Take over-the-counter and prescription medicines only as told by your health care provider.  If you were prescribed an antibiotic medicine, take it as told by your health care provider. Do not stop taking the antibiotic even if you start to feel better. Bathing  Do not take baths, swim, or use a hot tub until your health care provider approves. Ask your health care provider if you can take showers. You may only be allowed to take sponge baths. Incision care   Follow instructions from your health care provider about how to take care of your incision. Make sure you: ? Wash your hands with soap and water before you change your bandage (dressing). If soap and water are not available, use hand sanitizer. ? Change your dressing as told by your health care provider. ? Keep your dressing dry. ? Leave stitches (sutures), skin glue, or adhesive   strips in place. These skin closures may need to stay in place for 2 weeks or longer. If adhesive strip edges start to loosen and curl up, you may trim the loose edges. Do not remove adhesive strips completely unless your health care provider tells you to do that.  Check your incision area every day  for signs of infection. Check for: ? More redness, swelling, or pain. ? More fluid or blood. ? Warmth. ? Pus or a bad smell. Driving   Do not drive for 24 hours if you were given a medicine to help you relax (sedative) during your procedure.  If you did not receive a sedative, ask your health care provider when it is safe to drive. Activity  Return to your normal activities as told by your health care provider. Ask your health care provider what activities are safe for you.  Do not lift anything that is heavier than 10 lb (4.5 kg), or the limit that you are told, until your health care provider says that it is safe.  Do not do activities that involve lifting your arms over your head. General instructions  Do not use any products that contain nicotine or tobacco, such as cigarettes and e-cigarettes. These can delay healing. If you need help quitting, ask your health care provider.  Keep all follow-up visits as told by your health care provider. This is important. Contact a health care provider if:  You have more redness, swelling, or pain around your incision.  You have more fluid or blood coming from your incision.  Your incision feels warm to the touch.  You have pus or a bad smell coming from your incision.  You have pain that is not relieved by your pain medicine. Get help right away if you have:  A fever or chills.  Chest pain.  Difficulty breathing. Summary  After the procedure, it is common to have pain, soreness, swelling, or bruising near your incision.  If you were prescribed an antibiotic medicine, take it as told by your health care provider. Do not stop taking the antibiotic even if you start to feel better.  Do not drive for 24 hours if you were given a sedative during your procedure.  Return to your normal activities as told by your health care provider. Ask your health care provider what activities are safe for you. This information is not intended to  replace advice given to you by your health care provider. Make sure you discuss any questions you have with your health care provider. Document Revised: 07/19/2017 Document Reviewed: 07/19/2017 Elsevier Patient Education  2020 Elsevier Inc.   

## 2019-09-25 ENCOUNTER — Telehealth: Payer: Self-pay | Admitting: Oncology

## 2019-09-25 NOTE — Telephone Encounter (Signed)
Advised Shannon Ellis of message from Dr. Alvy Bimler.  She is going to call her mother to see if she would like an appointment and call back.

## 2019-09-25 NOTE — Telephone Encounter (Signed)
I do not diagnose PE on the basis of reduced oxygen saturations If her family wants more tests, evaluation and treatment, I can certainly see her and order more tests including blood work and CT imaging

## 2019-09-25 NOTE — Telephone Encounter (Signed)
[name] (daughter) called and said they met with [facility] yesterday for the first time. They measured Shannon Ellis's O2 saturation and it was 93%.  Her sister checked it latter in the day and it was 92% and then 95% when she moved around and coughed.   Shannon Ellis said that Shannon Ellis has not been as active in the last couple of weeks.  She seems to get tired easily and has no energy.  She is worried that she has a PE due to her oxygen saturation.  She said that Shannon Ellis does not have any pain but they just want to make sure they are not missing anything.

## 2019-12-23 ENCOUNTER — Other Ambulatory Visit: Payer: Self-pay

## 2019-12-23 ENCOUNTER — Telehealth: Payer: Self-pay

## 2019-12-23 DIAGNOSIS — C562 Malignant neoplasm of left ovary: Secondary | ICD-10-CM

## 2019-12-23 DIAGNOSIS — Z7189 Other specified counseling: Secondary | ICD-10-CM

## 2019-12-23 MED ORDER — ONDANSETRON HCL 8 MG PO TABS: 8.0000 mg | ORAL_TABLET | Freq: Three times a day (TID) | ORAL | 1 refills | Status: DC | PRN

## 2019-12-23 MED FILL — ONDANSETRON HCL 8 MG TABLET: 8 | 21 days supply | Qty: 18 | Fill #0

## 2019-12-23 NOTE — Telephone Encounter (Signed)
[facility] nurse called. Requesting refill on Zofran Rx. Rx sent to pharmacy. Nurse was giving update. Zaraya has had a couple episodes of dizziness/nausea and bp going up. On visit today blood pressure was 160/92. Just FYI

## 2019-12-30 DIAGNOSIS — M810 Age-related osteoporosis without current pathological fracture: Secondary | ICD-10-CM | POA: Diagnosis not present

## 2019-12-30 DIAGNOSIS — R7301 Impaired fasting glucose: Secondary | ICD-10-CM | POA: Diagnosis not present

## 2019-12-30 DIAGNOSIS — E7849 Other hyperlipidemia: Secondary | ICD-10-CM | POA: Diagnosis not present

## 2019-12-30 DIAGNOSIS — E038 Other specified hypothyroidism: Secondary | ICD-10-CM | POA: Diagnosis not present

## 2020-01-06 DIAGNOSIS — I10 Essential (primary) hypertension: Secondary | ICD-10-CM | POA: Diagnosis not present

## 2020-01-06 DIAGNOSIS — Z1331 Encounter for screening for depression: Secondary | ICD-10-CM | POA: Diagnosis not present

## 2020-01-06 DIAGNOSIS — Z0000 Encounter for general adult medical examination without abnormal findings: Secondary | ICD-10-CM | POA: Diagnosis not present

## 2020-01-06 DIAGNOSIS — G629 Polyneuropathy, unspecified: Secondary | ICD-10-CM | POA: Diagnosis not present

## 2020-01-06 DIAGNOSIS — N182 Chronic kidney disease, stage 2 (mild): Secondary | ICD-10-CM | POA: Diagnosis not present

## 2020-01-06 DIAGNOSIS — C569 Malignant neoplasm of unspecified ovary: Secondary | ICD-10-CM | POA: Diagnosis not present

## 2020-01-06 DIAGNOSIS — I129 Hypertensive chronic kidney disease with stage 1 through stage 4 chronic kidney disease, or unspecified chronic kidney disease: Secondary | ICD-10-CM | POA: Diagnosis not present

## 2020-01-06 DIAGNOSIS — R7301 Impaired fasting glucose: Secondary | ICD-10-CM | POA: Diagnosis not present

## 2020-01-06 DIAGNOSIS — E785 Hyperlipidemia, unspecified: Secondary | ICD-10-CM | POA: Diagnosis not present

## 2020-01-06 DIAGNOSIS — R82998 Other abnormal findings in urine: Secondary | ICD-10-CM | POA: Diagnosis not present

## 2020-01-06 DIAGNOSIS — E039 Hypothyroidism, unspecified: Secondary | ICD-10-CM | POA: Diagnosis not present

## 2020-01-06 DIAGNOSIS — H8110 Benign paroxysmal vertigo, unspecified ear: Secondary | ICD-10-CM | POA: Diagnosis not present

## 2020-01-06 DIAGNOSIS — M810 Age-related osteoporosis without current pathological fracture: Secondary | ICD-10-CM | POA: Diagnosis not present

## 2020-01-06 DIAGNOSIS — K644 Residual hemorrhoidal skin tags: Secondary | ICD-10-CM | POA: Diagnosis not present

## 2020-04-07 DIAGNOSIS — E039 Hypothyroidism, unspecified: Secondary | ICD-10-CM | POA: Diagnosis not present

## 2020-04-23 DIAGNOSIS — Z23 Encounter for immunization: Secondary | ICD-10-CM | POA: Diagnosis not present

## 2020-11-16 DIAGNOSIS — I10 Essential (primary) hypertension: Secondary | ICD-10-CM | POA: Diagnosis not present

## 2020-12-17 ENCOUNTER — Encounter: Payer: Self-pay | Admitting: Hematology and Oncology

## 2021-01-10 DIAGNOSIS — E039 Hypothyroidism, unspecified: Secondary | ICD-10-CM | POA: Diagnosis not present

## 2021-01-10 DIAGNOSIS — M810 Age-related osteoporosis without current pathological fracture: Secondary | ICD-10-CM | POA: Diagnosis not present

## 2021-01-10 DIAGNOSIS — R7301 Impaired fasting glucose: Secondary | ICD-10-CM | POA: Diagnosis not present

## 2021-01-10 DIAGNOSIS — E785 Hyperlipidemia, unspecified: Secondary | ICD-10-CM | POA: Diagnosis not present

## 2021-01-16 DIAGNOSIS — I10 Essential (primary) hypertension: Secondary | ICD-10-CM | POA: Diagnosis not present

## 2021-01-17 DIAGNOSIS — Z7189 Other specified counseling: Secondary | ICD-10-CM | POA: Diagnosis not present

## 2021-01-17 DIAGNOSIS — C772 Secondary and unspecified malignant neoplasm of intra-abdominal lymph nodes: Secondary | ICD-10-CM | POA: Diagnosis not present

## 2021-01-17 DIAGNOSIS — Z0000 Encounter for general adult medical examination without abnormal findings: Secondary | ICD-10-CM | POA: Diagnosis not present

## 2021-01-17 DIAGNOSIS — E785 Hyperlipidemia, unspecified: Secondary | ICD-10-CM | POA: Diagnosis not present

## 2021-01-17 DIAGNOSIS — N182 Chronic kidney disease, stage 2 (mild): Secondary | ICD-10-CM | POA: Diagnosis not present

## 2021-01-17 DIAGNOSIS — C569 Malignant neoplasm of unspecified ovary: Secondary | ICD-10-CM | POA: Diagnosis not present

## 2021-01-17 DIAGNOSIS — R82998 Other abnormal findings in urine: Secondary | ICD-10-CM | POA: Diagnosis not present

## 2021-01-17 DIAGNOSIS — I129 Hypertensive chronic kidney disease with stage 1 through stage 4 chronic kidney disease, or unspecified chronic kidney disease: Secondary | ICD-10-CM | POA: Diagnosis not present

## 2021-01-17 DIAGNOSIS — C786 Secondary malignant neoplasm of retroperitoneum and peritoneum: Secondary | ICD-10-CM | POA: Diagnosis not present

## 2021-01-17 DIAGNOSIS — I10 Essential (primary) hypertension: Secondary | ICD-10-CM | POA: Diagnosis not present

## 2021-01-17 DIAGNOSIS — I700 Atherosclerosis of aorta: Secondary | ICD-10-CM | POA: Diagnosis not present

## 2021-01-17 DIAGNOSIS — M810 Age-related osteoporosis without current pathological fracture: Secondary | ICD-10-CM | POA: Diagnosis not present

## 2021-01-17 DIAGNOSIS — E039 Hypothyroidism, unspecified: Secondary | ICD-10-CM | POA: Diagnosis not present

## 2021-02-16 DIAGNOSIS — I10 Essential (primary) hypertension: Secondary | ICD-10-CM | POA: Diagnosis not present

## 2021-03-18 DIAGNOSIS — I10 Essential (primary) hypertension: Secondary | ICD-10-CM | POA: Diagnosis not present

## 2021-04-18 DIAGNOSIS — I10 Essential (primary) hypertension: Secondary | ICD-10-CM | POA: Diagnosis not present

## 2021-05-18 DIAGNOSIS — I10 Essential (primary) hypertension: Secondary | ICD-10-CM | POA: Diagnosis not present

## 2021-06-18 DIAGNOSIS — I10 Essential (primary) hypertension: Secondary | ICD-10-CM | POA: Diagnosis not present

## 2021-09-09 NOTE — Progress Notes (Signed)
? ?  Interim Update ?[facility] is the home-based palliative care program of [facility] ? ?Primary Diagnosis: Metastatic ovarian epithelial cancer, peritoneal mets ? ?Other medical history includes: HTN; Hypothyroid; Peripheral neuropathy ? ?Admitted to [facility]: 09/24/2019 ? ?Current and previous weight:  179 lb / 177.6 (03/08/21); 172 (09/24/19) ? ?Current and previous Mid-Arm Circumference:  33.7 cm / 31 cm (09/24/19) ? ?Current and previous Palliative Performance Scale: 70%/ 60% ? ?Hospitalization/ED visit in the last 3 months: none ? ?Current symptoms/issues/interventions:  Ms. Ensz continues to remain independent as possible and can complete ADLs independently. On good days, she can go out and do light shopping. She has intermittent symptoms of abdominal pain and nausea (which generally resolves on its own) and some chronic knee/leg pain. She prefers to take minimal medicine. She has taken infrequent ondansetron for nausea. Whenever symptoms flare up, she wonders if her cancer is the underlying cause. Patient fears a precipitous decline due to a 35-monthprognosis years ago; RN has provided education about signs of terminal decline to eliminate surprises and decrease anxiety. Active listening and supportive counseling provided by [facility] staff (RN and SW). [facility] RN sees patient 1-2 times a month and she is appreciative of having a nurse to call for questions or concerns.  ?Social Worker remains available for prn support. ? ?Signs of decline over last 3 months: Patient expresses more days with symptoms of abdominal pain or nausea. She reports one recent with loss of balance and temporary vision loss in left eye.  ? ?Advance Directives: HCPOA, LW, DNR ? ?Goals of Care: Pt's goals are to remain in her home and to be as independent as possible. She wants to decrease the burden of her illness on her children. ?

## 2021-10-16 DIAGNOSIS — I10 Essential (primary) hypertension: Secondary | ICD-10-CM | POA: Diagnosis not present

## 2021-11-16 DIAGNOSIS — I10 Essential (primary) hypertension: Secondary | ICD-10-CM | POA: Diagnosis not present

## 2021-12-16 DIAGNOSIS — I10 Essential (primary) hypertension: Secondary | ICD-10-CM | POA: Diagnosis not present

## 2021-12-22 DIAGNOSIS — C772 Secondary and unspecified malignant neoplasm of intra-abdominal lymph nodes: Secondary | ICD-10-CM | POA: Diagnosis not present

## 2021-12-22 DIAGNOSIS — C786 Secondary malignant neoplasm of retroperitoneum and peritoneum: Secondary | ICD-10-CM | POA: Diagnosis not present

## 2021-12-22 DIAGNOSIS — C569 Malignant neoplasm of unspecified ovary: Secondary | ICD-10-CM | POA: Diagnosis not present

## 2021-12-22 DIAGNOSIS — K219 Gastro-esophageal reflux disease without esophagitis: Secondary | ICD-10-CM | POA: Diagnosis not present

## 2021-12-22 DIAGNOSIS — R1084 Generalized abdominal pain: Secondary | ICD-10-CM | POA: Diagnosis not present

## 2022-01-16 ENCOUNTER — Encounter: Payer: Self-pay | Admitting: Hematology and Oncology

## 2022-01-16 ENCOUNTER — Other Ambulatory Visit: Payer: Self-pay | Admitting: Internal Medicine

## 2022-01-16 DIAGNOSIS — R1084 Generalized abdominal pain: Secondary | ICD-10-CM

## 2022-01-16 DIAGNOSIS — I10 Essential (primary) hypertension: Secondary | ICD-10-CM | POA: Diagnosis not present

## 2022-01-16 DIAGNOSIS — C569 Malignant neoplasm of unspecified ovary: Secondary | ICD-10-CM

## 2022-01-17 ENCOUNTER — Ambulatory Visit
Admission: RE | Admit: 2022-01-17 | Discharge: 2022-01-17 | Disposition: A | Discharge status: Home / Self Care | Payer: Medicare PPO | Source: Ambulatory Visit | Attending: Internal Medicine | Admitting: Internal Medicine

## 2022-01-17 DIAGNOSIS — R1084 Generalized abdominal pain: Secondary | ICD-10-CM

## 2022-01-17 DIAGNOSIS — K439 Ventral hernia without obstruction or gangrene: Secondary | ICD-10-CM | POA: Diagnosis not present

## 2022-01-17 DIAGNOSIS — Z8543 Personal history of malignant neoplasm of ovary: Secondary | ICD-10-CM | POA: Diagnosis not present

## 2022-01-17 DIAGNOSIS — K661 Hemoperitoneum: Secondary | ICD-10-CM | POA: Diagnosis not present

## 2022-01-17 DIAGNOSIS — K5900 Constipation, unspecified: Secondary | ICD-10-CM | POA: Diagnosis not present

## 2022-01-17 DIAGNOSIS — C569 Malignant neoplasm of unspecified ovary: Secondary | ICD-10-CM

## 2022-01-17 MED ORDER — IOPAMIDOL (ISOVUE-300) INJECTION 61%
100.0000 mL | Freq: Once | INTRAVENOUS | Status: AC | PRN
  Administered 2022-01-17: 100 mL via INTRAVENOUS

## 2022-01-19 DIAGNOSIS — C569 Malignant neoplasm of unspecified ovary: Secondary | ICD-10-CM | POA: Diagnosis not present

## 2022-01-19 DIAGNOSIS — C772 Secondary and unspecified malignant neoplasm of intra-abdominal lymph nodes: Secondary | ICD-10-CM | POA: Diagnosis not present

## 2022-01-23 DIAGNOSIS — E039 Hypothyroidism, unspecified: Secondary | ICD-10-CM | POA: Diagnosis not present

## 2022-01-23 DIAGNOSIS — E785 Hyperlipidemia, unspecified: Secondary | ICD-10-CM | POA: Diagnosis not present

## 2022-01-23 DIAGNOSIS — M810 Age-related osteoporosis without current pathological fracture: Secondary | ICD-10-CM | POA: Diagnosis not present

## 2022-01-23 DIAGNOSIS — I10 Essential (primary) hypertension: Secondary | ICD-10-CM | POA: Diagnosis not present

## 2022-01-23 DIAGNOSIS — R7989 Other specified abnormal findings of blood chemistry: Secondary | ICD-10-CM | POA: Diagnosis not present

## 2022-01-23 DIAGNOSIS — R7301 Impaired fasting glucose: Secondary | ICD-10-CM | POA: Diagnosis not present

## 2022-01-30 DIAGNOSIS — E785 Hyperlipidemia, unspecified: Secondary | ICD-10-CM | POA: Diagnosis not present

## 2022-01-30 DIAGNOSIS — Z0000 Encounter for general adult medical examination without abnormal findings: Secondary | ICD-10-CM | POA: Diagnosis not present

## 2022-01-30 DIAGNOSIS — I129 Hypertensive chronic kidney disease with stage 1 through stage 4 chronic kidney disease, or unspecified chronic kidney disease: Secondary | ICD-10-CM | POA: Diagnosis not present

## 2022-01-30 DIAGNOSIS — I700 Atherosclerosis of aorta: Secondary | ICD-10-CM | POA: Diagnosis not present

## 2022-01-30 DIAGNOSIS — R82998 Other abnormal findings in urine: Secondary | ICD-10-CM | POA: Diagnosis not present

## 2022-01-30 DIAGNOSIS — C569 Malignant neoplasm of unspecified ovary: Secondary | ICD-10-CM | POA: Diagnosis not present

## 2022-01-30 DIAGNOSIS — Z23 Encounter for immunization: Secondary | ICD-10-CM | POA: Diagnosis not present

## 2022-01-30 DIAGNOSIS — N182 Chronic kidney disease, stage 2 (mild): Secondary | ICD-10-CM | POA: Diagnosis not present

## 2022-01-30 DIAGNOSIS — Z1331 Encounter for screening for depression: Secondary | ICD-10-CM | POA: Diagnosis not present

## 2022-01-30 DIAGNOSIS — R7301 Impaired fasting glucose: Secondary | ICD-10-CM | POA: Diagnosis not present

## 2022-01-30 DIAGNOSIS — C786 Secondary malignant neoplasm of retroperitoneum and peritoneum: Secondary | ICD-10-CM | POA: Diagnosis not present

## 2022-01-30 DIAGNOSIS — C772 Secondary and unspecified malignant neoplasm of intra-abdominal lymph nodes: Secondary | ICD-10-CM | POA: Diagnosis not present

## 2022-01-30 DIAGNOSIS — Z1339 Encounter for screening examination for other mental health and behavioral disorders: Secondary | ICD-10-CM | POA: Diagnosis not present

## 2022-02-16 DIAGNOSIS — I10 Essential (primary) hypertension: Secondary | ICD-10-CM | POA: Diagnosis not present

## 2022-03-18 DIAGNOSIS — J449 Chronic obstructive pulmonary disease, unspecified: Secondary | ICD-10-CM | POA: Diagnosis not present

## 2022-03-18 DIAGNOSIS — I4819 Other persistent atrial fibrillation: Secondary | ICD-10-CM | POA: Diagnosis not present

## 2022-04-18 DIAGNOSIS — I10 Essential (primary) hypertension: Secondary | ICD-10-CM | POA: Diagnosis not present

## 2022-05-09 DIAGNOSIS — B349 Viral infection, unspecified: Secondary | ICD-10-CM | POA: Diagnosis not present

## 2022-05-09 DIAGNOSIS — R0602 Shortness of breath: Secondary | ICD-10-CM | POA: Diagnosis not present

## 2022-05-09 DIAGNOSIS — R5383 Other fatigue: Secondary | ICD-10-CM | POA: Diagnosis not present

## 2022-05-09 DIAGNOSIS — C772 Secondary and unspecified malignant neoplasm of intra-abdominal lymph nodes: Secondary | ICD-10-CM | POA: Diagnosis not present

## 2022-05-09 DIAGNOSIS — R7989 Other specified abnormal findings of blood chemistry: Secondary | ICD-10-CM | POA: Diagnosis not present

## 2022-05-09 DIAGNOSIS — E871 Hypo-osmolality and hyponatremia: Secondary | ICD-10-CM | POA: Diagnosis not present

## 2022-07-08 ENCOUNTER — Emergency Department (HOSPITAL_BASED_OUTPATIENT_CLINIC_OR_DEPARTMENT_OTHER)
Admission: EM | Admit: 2022-07-08 | Discharge: 2022-07-08 | Disposition: A | Discharge status: Home / Self Care | Attending: Emergency Medicine | Admitting: Emergency Medicine

## 2022-07-08 ENCOUNTER — Emergency Department (HOSPITAL_BASED_OUTPATIENT_CLINIC_OR_DEPARTMENT_OTHER)

## 2022-07-08 DIAGNOSIS — R42 Dizziness and giddiness: Secondary | ICD-10-CM | POA: Insufficient documentation

## 2022-07-08 DIAGNOSIS — Z8543 Personal history of malignant neoplasm of ovary: Secondary | ICD-10-CM | POA: Insufficient documentation

## 2022-07-08 DIAGNOSIS — R55 Syncope and collapse: Secondary | ICD-10-CM | POA: Diagnosis not present

## 2022-07-08 DIAGNOSIS — I10 Essential (primary) hypertension: Secondary | ICD-10-CM | POA: Insufficient documentation

## 2022-07-08 DIAGNOSIS — I6381 Other cerebral infarction due to occlusion or stenosis of small artery: Secondary | ICD-10-CM | POA: Diagnosis not present

## 2022-07-08 DIAGNOSIS — D72829 Elevated white blood cell count, unspecified: Secondary | ICD-10-CM | POA: Insufficient documentation

## 2022-07-08 DIAGNOSIS — Z79899 Other long term (current) drug therapy: Secondary | ICD-10-CM | POA: Diagnosis not present

## 2022-07-08 DIAGNOSIS — E039 Hypothyroidism, unspecified: Secondary | ICD-10-CM | POA: Diagnosis not present

## 2022-07-08 DIAGNOSIS — R030 Elevated blood-pressure reading, without diagnosis of hypertension: Secondary | ICD-10-CM

## 2022-07-08 LAB — CBC WITH DIFFERENTIAL/PLATELET
Abs Immature Granulocytes: 0.49 10*3/uL — ABNORMAL HIGH (ref 0.00–0.07)
Basophils Absolute: 0.1 10*3/uL (ref 0.0–0.1)
Basophils Relative: 1 %
Eosinophils Absolute: 0 10*3/uL (ref 0.0–0.5)
Eosinophils Relative: 0 %
HCT: 41.4 % (ref 36.0–46.0)
Hemoglobin: 13.8 g/dL (ref 12.0–15.0)
Immature Granulocytes: 4 %
Lymphocytes Relative: 13 %
Lymphs Abs: 1.6 10*3/uL (ref 0.7–4.0)
MCH: 29.4 pg (ref 26.0–34.0)
MCHC: 33.3 g/dL (ref 30.0–36.0)
MCV: 88.1 fL (ref 80.0–100.0)
Monocytes Absolute: 0.6 10*3/uL (ref 0.1–1.0)
Monocytes Relative: 5 %
Neutro Abs: 9.4 10*3/uL — ABNORMAL HIGH (ref 1.7–7.7)
Neutrophils Relative %: 77 %
Platelets: 316 10*3/uL (ref 150–400)
RBC: 4.7 MIL/uL (ref 3.87–5.11)
RDW: 15.1 % (ref 11.5–15.5)
WBC: 12.2 10*3/uL — ABNORMAL HIGH (ref 4.0–10.5)
nRBC: 0 % (ref 0.0–0.2)

## 2022-07-08 LAB — URINALYSIS, ROUTINE W REFLEX MICROSCOPIC
Bilirubin Urine: NEGATIVE
Glucose, UA: NEGATIVE mg/dL
Hgb urine dipstick: NEGATIVE
Ketones, ur: NEGATIVE mg/dL
Leukocytes,Ua: NEGATIVE
Nitrite: NEGATIVE
Protein, ur: NEGATIVE mg/dL
Specific Gravity, Urine: <1.005 — ABNORMAL LOW (ref 1.005–1.030)
pH: 7 (ref 5.0–8.0)

## 2022-07-08 LAB — COMPREHENSIVE METABOLIC PANEL
ALT: 21 U/L (ref 0–44)
AST: 17 U/L (ref 15–41)
Albumin: 3.7 g/dL (ref 3.5–5.0)
Alkaline Phosphatase: 53 U/L (ref 38–126)
Anion gap: 8 (ref 5–15)
BUN: 12 mg/dL (ref 8–23)
CO2: 28 mmol/L (ref 22–32)
Calcium: 9.6 mg/dL (ref 8.9–10.3)
Chloride: 100 mmol/L (ref 98–111)
Creatinine, Ser: 0.59 mg/dL (ref 0.44–1.00)
GFR, Estimated: >60 mL/min (ref >60)
Glucose, Bld: 174 mg/dL — ABNORMAL HIGH (ref 70–99)
Potassium: 3.9 mmol/L (ref 3.5–5.1)
Sodium: 136 mmol/L (ref 135–145)
Total Bilirubin: 0.6 mg/dL (ref 0.3–1.2)
Total Protein: 7.3 g/dL (ref 6.5–8.1)

## 2022-07-08 LAB — TROPONIN I (HIGH SENSITIVITY)
Troponin I (High Sensitivity): 10 ng/L (ref <18)
Troponin I (High Sensitivity): 11 ng/L (ref <18)

## 2022-07-08 MED ORDER — SODIUM CHLORIDE 0.9 % IV BOLUS
500.0000 mL | Freq: Once | INTRAVENOUS | Status: AC
  Administered 2022-07-08: 500 mL via INTRAVENOUS

## 2022-07-08 NOTE — ED Triage Notes (Addendum)
Pt reports an episode of dizziness this morning upon getting out of bed and tingling in bilateral lower legs and upper arms.  Pt states symtpoms resolved when she sat back down, but that her BP at home was systolic in the lower 497'W and "not coming down."  Daughter arrived after patient's episode to check her BP.  Pt reports symptoms other than elevated BP have resolved.

## 2022-07-08 NOTE — ED Provider Notes (Addendum)
[facility] Provider Note   CSN: [id number] Arrival date & time: 07/08/22  1059     History  Chief Complaint  Patient presents with   Dizziness    Shannon Ellis is a 89 y.o. female.  Patient is a 89 year old female who presents with dizziness and near syncope.  She has a history of end-stage metastatic ovarian cancer and is currently on hospice for the last 2 months although she is not full comfort care.  She also has a history of hypertension hypothyroidism per chart review.  She lives at home.  She states she was getting out of bed this morning and felt like she was going to pass out.  She was dizzy and nauseated and she felt tingling in her arms and legs.  She also had some achiness across her chest and upper arms.  When she sat down this all resolved.  She had a couple more less severe episodes when she tried to stand up throughout the morning.  Her blood pressure has been elevated since this morning.  She denies any significant headache.  No ongoing dizziness.  No fevers.  No cough or cold symptoms.  No ongoing chest pain or tightness.  No vomiting or diarrhea.       Home Medications Prior to Admission medications   Medication Sig Start Date End Date Taking? Authorizing Provider  acetaminophen (TYLENOL) 500 MG tablet Take 500 mg by mouth every 8 (eight) hours as needed for mild pain or headache.     [provider]  amLODipine (NORVASC) 5 MG tablet Take 5 mg by mouth at bedtime.     [provider]  fluticasone (FLONASE) 50 MCG/ACT nasal spray Place 2 sprays into both nostrils 2 (two) times daily. Patient taking differently: Place 2 sprays into both nostrils as needed.  06/13/18   Tanner, Lyndon Code., PA-C  levothyroxine (SYNTHROID, LEVOTHROID) 88 MCG tablet Take 88 mcg by mouth daily before breakfast.    [provider]  lidocaine-prilocaine (EMLA) cream Apply to affected area once 02/26/19   [name], MD   loratadine (CLARITIN) 10 MG tablet Take 10 mg by mouth daily as needed for allergies.     [provider]  metoprolol succinate (TOPROL-XL) 50 MG 24 hr tablet Take 50 mg by mouth every morning.  10/15/17   [provider]  Multiple Vitamins-Minerals (MULTIVITAMINS THER. W/MINERALS) TABS Take 1 tablet by mouth daily.      [provider]  naphazoline-pheniramine (NAPHCON-A) 0.025-0.3 % ophthalmic solution Place 1 drop into both eyes daily as needed for eye irritation.    [provider]  olmesartan (BENICAR) 40 MG tablet Take 40 mg by mouth at bedtime.     [provider]  ondansetron (ZOFRAN) 8 MG tablet Take 1 tablet (8 mg total) by mouth every 8 (eight) hours as needed. 12/23/19   [name], MD  ondansetron (ZOFRAN-ODT) 8 MG disintegrating tablet Take 1 tablet (8 mg total) by mouth every 8 (eight) hours as needed for nausea or vomiting. 03/05/19   Tanner, Lyndon Code., PA-C  prochlorperazine (COMPAZINE) 10 MG tablet Take 1 tablet (10 mg total) by mouth every 6 (six) hours as needed (Nausea or vomiting). 02/26/19   [name], MD  traMADol (ULTRAM) 50 MG tablet Take 1 tablet (50 mg total) by mouth every 6 (six) hours as needed. 05/30/19   [name], MD      Allergies    Patient has no known  allergies.    Review of Systems   Review of Systems  Constitutional:  Positive for fatigue. Negative for chills, diaphoresis and fever.  HENT:  Negative for congestion, rhinorrhea and sneezing.   Eyes: Negative.   Respiratory:  Positive for chest tightness. Negative for cough and shortness of breath.   Cardiovascular:  Negative for chest pain and leg swelling.  Gastrointestinal:  Positive for nausea. Negative for abdominal pain, blood in stool, diarrhea and vomiting.  Genitourinary:  Negative for difficulty urinating, flank pain, frequency and hematuria.  Musculoskeletal:  Negative for arthralgias and back pain.  Skin:  Negative for rash.  Neurological:  Positive  for dizziness and light-headedness. Negative for speech difficulty, weakness, numbness and headaches.    Physical Exam Updated Vital Signs BP (!) 176/80 (BP Location: Left Arm)   Pulse 83   Temp 97.8 F (36.6 C) (Oral)   Resp 16   SpO2 96%  Physical Exam Constitutional:      Appearance: She is well-developed.  HENT:     Head: Normocephalic and atraumatic.  Eyes:     Pupils: Pupils are equal, round, and reactive to light.  Cardiovascular:     Rate and Rhythm: Normal rate and regular rhythm.     Heart sounds: Normal heart sounds.  Pulmonary:     Effort: Pulmonary effort is normal. No respiratory distress.     Breath sounds: Normal breath sounds. No wheezing or rales.  Chest:     Chest wall: No tenderness.  Abdominal:     General: Bowel sounds are normal.     Palpations: Abdomen is soft.     Tenderness: There is no abdominal tenderness. There is no guarding or rebound.  Musculoskeletal:        General: Normal range of motion.     Cervical back: Normal range of motion and neck supple.  Lymphadenopathy:     Cervical: No cervical adenopathy.  Skin:    General: Skin is warm and dry.     Findings: No rash.  Neurological:     Mental Status: She is alert and oriented to person, place, and time.     Comments: Motor 5/5 all extremities Sensation grossly intact to LT all extremities Finger to Nose intact, no pronator drift CN II-XII grossly intact       ED Results / Procedures / Treatments   Labs (all labs ordered are listed, but only abnormal results are displayed) Labs Reviewed  COMPREHENSIVE METABOLIC PANEL - Abnormal; Notable for the following components:      Result Value   Glucose, Bld 174 (*)    All other components within normal limits  CBC WITH DIFFERENTIAL/PLATELET - Abnormal; Notable for the following components:   WBC 12.2 (*)    Neutro Abs 9.4 (*)    Abs Immature Granulocytes 0.49 (*)    All other components within normal limits  URINALYSIS, ROUTINE W  REFLEX MICROSCOPIC - Abnormal; Notable for the following components:   Color, Urine COLORLESS (*)    Specific Gravity, Urine <1.005 (*)    All other components within normal limits  TROPONIN I (HIGH SENSITIVITY)  TROPONIN I (HIGH SENSITIVITY)    EKG EKG Interpretation  Date/Time:  Saturday July 08 2022 11:20:13 EST Ventricular Rate:  79 PR Interval:  192 QRS Duration: 100 QT Interval:  418 QTC Calculation: 480 R Axis:   41 Text Interpretation: Sinus rhythm Consider left atrial enlargement since last tracing no significant change Confirmed by Malvin Johns ([id number]) on 07/08/2022 11:44:52 AM  Radiology CT Head Wo Contrast  Result Date: 07/08/2022 CLINICAL DATA:  Presyncopal episode. EXAM: CT HEAD WITHOUT CONTRAST TECHNIQUE: Contiguous axial images were obtained from the base of the skull through the vertex without intravenous contrast. RADIATION DOSE REDUCTION: This exam was performed according to the departmental dose-optimization program which includes automated exposure control, adjustment of the mA and/or kV according to patient size and/or use of iterative reconstruction technique. COMPARISON:  CT brain 11/13/2012 FINDINGS: Brain: Ventricles and sulci are appropriate for patient's age. Periventricular and subcortical white matter hypodensities compatible with chronic microvascular ischemic changes. No evidence for acute cortically based infarct, intracranial hemorrhage, mass lesion or mass-effect. Bilateral remote lacunar infarcts in the basal ganglia bilaterally. Vascular: No hyperdense vessel or unexpected calcification. Skull: Intact. Sinuses/Orbits: Paranasal sinuses well aerated. Small amount of fluid in the sphenoid sinus. Mastoid air cells are unremarkable. Other: None IMPRESSION: 1. No acute intracranial process. 2. Atrophy and chronic microvascular ischemic changes. Electronically Signed   By: Lovey Newcomer M.D.   On: 07/08/2022 12:36    Procedures Procedures    Medications  Ordered in ED Medications  sodium chloride 0.9 % bolus 500 mL (0 mLs Intravenous Stopped 07/08/22 1330)  sodium chloride 0.9 % bolus 500 mL (500 mLs Intravenous New Bag/Given 07/08/22 1418)    ED Course/ Medical Decision Making/ A&P                             Medical Decision Making Amount and/or Complexity of Data Reviewed Labs: ordered. Radiology: ordered.   Patient is a 89 year old female who presents with some lightheadedness and near syncopal type episode this morning.  She did have some discomfort across her chest and upper arms when she stood up but she did not have any more when she sat down.  It did not seem to be exertional in nature.  It seems like she had a vasovagal type episode.  She was concerned about her high blood pressure.  Last when here is 176/80.  Her other vital signs are intact.  Her EKG does not show any ischemic changes.  Her troponins are negative.  She was given IV fluids.  Her other labs are nonconcerning.  Her WBC count is mildly elevated but she does not have a fever.  Her urinalysis is not concerning for infection.  No other symptoms that sound more concerning for infection.  She does not have any vertiginous type symptoms.  She did have a head CT which shows no acute abnormalities.  No neurologic deficits or concerns for stroke.  No other symptoms that sound more concerning for ACS.  Overall feeling better.  She was able to walk to the bathroom without dizziness or other symptoms.  She was discharged home in good condition.  Return precautions were given.  She was encouraged to follow-up with her doctors.  Final Clinical Impression(s) / ED Diagnoses Final diagnoses:  Dizziness    Rx / DC Orders ED Discharge Orders     None         Malvin Johns, MD 07/08/22 1459    Malvin Johns, MD 07/08/22 1510

## 2022-07-08 NOTE — Discharge Instructions (Signed)
Take your medications as prescribed.  Follow-up with your doctor.  Return to the emergency room if you have any worsening symptoms.

## 2022-07-13 ENCOUNTER — Telehealth: Payer: Self-pay | Admitting: *Deleted

## 2022-07-13 NOTE — Telephone Encounter (Signed)
        Patient  visited [facility]  on 07/08/2022  for dizziness   Telephone encounter attempt :  1st  A HIPAA compliant voice message was left requesting a return call.  Instructed patient to call back at [phone].  [facility] [phone] [address] , [place] [address] Email : [email or web address]

## 2022-07-18 ENCOUNTER — Encounter: Payer: Self-pay | Admitting: Hematology and Oncology

## 2023-02-08 DIAGNOSIS — M810 Age-related osteoporosis without current pathological fracture: Secondary | ICD-10-CM | POA: Diagnosis not present

## 2023-02-08 DIAGNOSIS — E785 Hyperlipidemia, unspecified: Secondary | ICD-10-CM | POA: Diagnosis not present

## 2023-02-08 DIAGNOSIS — N182 Chronic kidney disease, stage 2 (mild): Secondary | ICD-10-CM | POA: Diagnosis not present

## 2023-02-08 DIAGNOSIS — I129 Hypertensive chronic kidney disease with stage 1 through stage 4 chronic kidney disease, or unspecified chronic kidney disease: Secondary | ICD-10-CM | POA: Diagnosis not present

## 2023-02-08 DIAGNOSIS — E039 Hypothyroidism, unspecified: Secondary | ICD-10-CM | POA: Diagnosis not present

## 2023-02-08 DIAGNOSIS — R7301 Impaired fasting glucose: Secondary | ICD-10-CM | POA: Diagnosis not present

## 2023-02-15 DIAGNOSIS — I700 Atherosclerosis of aorta: Secondary | ICD-10-CM | POA: Diagnosis not present

## 2023-02-15 DIAGNOSIS — E039 Hypothyroidism, unspecified: Secondary | ICD-10-CM | POA: Diagnosis not present

## 2023-02-15 DIAGNOSIS — E785 Hyperlipidemia, unspecified: Secondary | ICD-10-CM | POA: Diagnosis not present

## 2023-02-15 DIAGNOSIS — Z1331 Encounter for screening for depression: Secondary | ICD-10-CM | POA: Diagnosis not present

## 2023-02-15 DIAGNOSIS — C569 Malignant neoplasm of unspecified ovary: Secondary | ICD-10-CM | POA: Diagnosis not present

## 2023-02-15 DIAGNOSIS — Z0000 Encounter for general adult medical examination without abnormal findings: Secondary | ICD-10-CM | POA: Diagnosis not present

## 2023-02-15 DIAGNOSIS — I129 Hypertensive chronic kidney disease with stage 1 through stage 4 chronic kidney disease, or unspecified chronic kidney disease: Secondary | ICD-10-CM | POA: Diagnosis not present

## 2023-02-15 DIAGNOSIS — N182 Chronic kidney disease, stage 2 (mild): Secondary | ICD-10-CM | POA: Diagnosis not present

## 2023-02-15 DIAGNOSIS — Z1339 Encounter for screening examination for other mental health and behavioral disorders: Secondary | ICD-10-CM | POA: Diagnosis not present

## 2023-02-15 DIAGNOSIS — E1169 Type 2 diabetes mellitus with other specified complication: Secondary | ICD-10-CM | POA: Diagnosis not present

## 2023-02-15 DIAGNOSIS — C772 Secondary and unspecified malignant neoplasm of intra-abdominal lymph nodes: Secondary | ICD-10-CM | POA: Diagnosis not present

## 2023-02-15 DIAGNOSIS — Z7189 Other specified counseling: Secondary | ICD-10-CM | POA: Diagnosis not present

## 2023-08-18 DEATH — deceased
